# Patient Record
Sex: Male | Born: 1945 | Race: White | Hispanic: No | Marital: Married | Smoking: Never smoker
Health system: Southern US, Community
[De-identification: ages and names within clinical notes are randomized; demographics above are authoritative.]

## PROBLEM LIST (undated history)

## (undated) DIAGNOSIS — J189 Pneumonia, unspecified organism: Secondary | ICD-10-CM

## (undated) DIAGNOSIS — E785 Hyperlipidemia, unspecified: Secondary | ICD-10-CM

## (undated) DIAGNOSIS — E349 Endocrine disorder, unspecified: Secondary | ICD-10-CM

## (undated) DIAGNOSIS — I10 Essential (primary) hypertension: Secondary | ICD-10-CM

## (undated) DIAGNOSIS — G4730 Sleep apnea, unspecified: Secondary | ICD-10-CM

## (undated) DIAGNOSIS — C801 Malignant (primary) neoplasm, unspecified: Secondary | ICD-10-CM

## (undated) DIAGNOSIS — K219 Gastro-esophageal reflux disease without esophagitis: Secondary | ICD-10-CM

## (undated) DIAGNOSIS — K56609 Unspecified intestinal obstruction, unspecified as to partial versus complete obstruction: Secondary | ICD-10-CM

## (undated) DIAGNOSIS — D649 Anemia, unspecified: Secondary | ICD-10-CM

## (undated) DIAGNOSIS — E119 Type 2 diabetes mellitus without complications: Secondary | ICD-10-CM

## (undated) DIAGNOSIS — Z8572 Personal history of non-Hodgkin lymphomas: Secondary | ICD-10-CM

## (undated) DIAGNOSIS — C8590 Non-Hodgkin lymphoma, unspecified, unspecified site: Secondary | ICD-10-CM

## (undated) HISTORY — DX: Gastro-esophageal reflux disease without esophagitis: K21.9

## (undated) HISTORY — DX: Type 2 diabetes mellitus without complications: E11.9

## (undated) HISTORY — DX: Anemia, unspecified: D64.9

## (undated) HISTORY — DX: Endocrine disorder, unspecified: E34.9

## (undated) HISTORY — DX: Hyperlipidemia, unspecified: E78.5

## (undated) HISTORY — DX: Pneumonia, unspecified organism: J18.9

## (undated) HISTORY — DX: Essential (primary) hypertension: I10

## (undated) HISTORY — DX: Sleep apnea, unspecified: G47.30

## (undated) HISTORY — DX: Malignant (primary) neoplasm, unspecified: C80.1

## (undated) HISTORY — DX: Non-Hodgkin lymphoma, unspecified, unspecified site: C85.90

## (undated) HISTORY — DX: Unspecified intestinal obstruction, unspecified as to partial versus complete obstruction: K56.609

## (undated) HISTORY — PX: CARDIAC CATHETERIZATION: SHX172

## (undated) HISTORY — DX: Personal history of non-Hodgkin lymphomas: Z85.72

---

## 2009-09-08 DIAGNOSIS — C801 Malignant (primary) neoplasm, unspecified: Secondary | ICD-10-CM

## 2009-09-08 HISTORY — DX: Malignant (primary) neoplasm, unspecified: C80.1

## 2011-09-09 DIAGNOSIS — G4730 Sleep apnea, unspecified: Secondary | ICD-10-CM

## 2011-09-09 HISTORY — DX: Sleep apnea, unspecified: G47.30

## 2012-06-30 DIAGNOSIS — Z923 Personal history of irradiation: Secondary | ICD-10-CM | POA: Insufficient documentation

## 2012-06-30 DIAGNOSIS — C83398 Diffuse large b-cell lymphoma of other extranodal and solid organ sites: Secondary | ICD-10-CM | POA: Insufficient documentation

## 2012-06-30 DIAGNOSIS — C8339 Diffuse large B-cell lymphoma, extranodal and solid organ sites: Secondary | ICD-10-CM | POA: Insufficient documentation

## 2012-06-30 DIAGNOSIS — Z9221 Personal history of antineoplastic chemotherapy: Secondary | ICD-10-CM | POA: Insufficient documentation

## 2016-01-30 ENCOUNTER — Encounter (INDEPENDENT_AMBULATORY_CARE_PROVIDER_SITE_OTHER): Payer: Self-pay

## 2016-01-30 ENCOUNTER — Encounter: Payer: Self-pay | Admitting: Cardiovascular Disease

## 2016-01-30 ENCOUNTER — Ambulatory Visit (INDEPENDENT_AMBULATORY_CARE_PROVIDER_SITE_OTHER): Payer: Medicare Other | Admitting: Cardiovascular Disease

## 2016-01-30 VITALS — BP 118/80 | HR 61 | Ht 68.5 in | Wt 223.8 lb

## 2016-01-30 DIAGNOSIS — I739 Peripheral vascular disease, unspecified: Secondary | ICD-10-CM

## 2016-01-30 DIAGNOSIS — R7989 Other specified abnormal findings of blood chemistry: Secondary | ICD-10-CM

## 2016-01-30 DIAGNOSIS — E119 Type 2 diabetes mellitus without complications: Secondary | ICD-10-CM | POA: Diagnosis not present

## 2016-01-30 DIAGNOSIS — I10 Essential (primary) hypertension: Secondary | ICD-10-CM

## 2016-01-30 DIAGNOSIS — I25111 Atherosclerotic heart disease of native coronary artery with angina pectoris with documented spasm: Secondary | ICD-10-CM

## 2016-01-30 DIAGNOSIS — E669 Obesity, unspecified: Secondary | ICD-10-CM

## 2016-01-30 DIAGNOSIS — R079 Chest pain, unspecified: Secondary | ICD-10-CM | POA: Diagnosis not present

## 2016-01-30 DIAGNOSIS — E291 Testicular hypofunction: Secondary | ICD-10-CM | POA: Diagnosis not present

## 2016-01-30 DIAGNOSIS — I159 Secondary hypertension, unspecified: Secondary | ICD-10-CM | POA: Diagnosis not present

## 2016-01-30 DIAGNOSIS — E785 Hyperlipidemia, unspecified: Secondary | ICD-10-CM

## 2016-01-30 DIAGNOSIS — I2510 Atherosclerotic heart disease of native coronary artery without angina pectoris: Secondary | ICD-10-CM

## 2016-01-30 NOTE — Patient Instructions (Signed)
You are doing well. No medication changes were made.  We will check labs today BMP, lipids, free testosterone, hbaic  Please call us if you have new issues that need to be addressed before your next appt.  Your physician wants you to follow-up in: 6 months.  You will receive a reminder letter in the mail two months in advance. If you don't receive a letter, please call our office to schedule the follow-up appointment.

## 2016-01-30 NOTE — Assessment & Plan Note (Signed)
Coronary calcifications seen on PET scan 2013 and 2015 Discussed with him in detail, he denies any symptoms concerning for angina Prior stress test in 2013

## 2016-01-30 NOTE — Progress Notes (Signed)
Patient ID: [name], male    DOB: [birth year], 70 y.o.   MRN: [id number]  HPI Comments: Mr. 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 by his PCP around 6/12, Who presents to establish care in the [place] office for his hypertension, hyperlipidemia He has self-referred himself to our office at the suggestion of his son who works in the hospital system  In general reports that he feels well with no complaints Blood pressure typically well controlled at home, compliant with hisCPAP  reports he is lost 20 pounds over the past year by watching his diet No regular exercise regiment  Review of radius testing with him shows PET scan 2013 and 2015 showing coronary calcifications Other scanning showing mild aortic atherosclerosis  Lab work reviewed with him showing total cholesterol in the 130 range, LDL 74, triglycerides 177 Testosterone 300, currently receives shot every 2 weeks Normal LFTs Elevated creatinine 1.4, BUN 32  EKG shows normal sinus rhythm with rate 61 bpm, right bundle branch block        Allergies  Allergen Reactions  . Penicillins Hives     Medication List       This list is accurate as of: 01/30/16 10:07 AM.  Always use your most recent med list.               aspirin 81 MG tablet  Take 81 mg by mouth daily.     atorvastatin 20 MG tablet  Commonly known as:  LIPITOR  Take 20 mg by mouth daily.     CALCIUM 600 PO  Take by mouth daily.     FIBER CHOICE PO  Take by mouth daily.     Glucosamine-Chondroitin-Vit C  2000-1200-60 MG/30ML Liqd  Take by mouth daily.     hydrochlorothiazide 25 MG tablet  Commonly known as:  HYDRODIURIL  Take 25 mg by mouth daily.     lisinopril 40 MG tablet  Commonly known as:  PRINIVIL,ZESTRIL  Take 40 mg by mouth daily.     Magnesium 250 MG Tabs  Take by mouth daily.     metFORMIN 500 MG tablet  Commonly known as:  GLUCOPHAGE  Take 500 mg by mouth 2 (two) times daily with a meal.     metoprolol tartrate 25 MG tablet  Commonly known as:  LOPRESSOR  Take 25 mg by mouth 2 (two) times daily.     MOVE FREE PO  Take by mouth daily.     MSM 500 MG Caps  Take by mouth daily.     multivitamin tablet  Take 1 tablet by mouth daily.     OMEGA 3 PO  Take by mouth daily.     pantoprazole 40 MG tablet  Commonly known as:  PROTONIX  Take 40 mg by mouth daily.     spironolactone 25 MG tablet  Commonly known as:  ALDACTONE  Take 25 mg by mouth daily.     vitamin C 1000 MG tablet  Take 1,000 mg by mouth daily.     vitamin E 400 UNIT capsule  Take 400 Units by mouth daily.  Past Medical History  Diagnosis Date  . Diabetes mellitus without complication ([place])   . Hypotestosteronemia   . Hyperlipidemia   . Hx of lymphoma   . Sleep apnea   . Hypertension     Past Surgical History  Procedure Laterality Date  . Cardiac catheterization      Dr. Sabra Heck    Social History  reports that he has never smoked. He does not have any smokeless tobacco history on file. He reports that he does not drink alcohol or use illicit drugs.  Family History family history includes Heart attack in his father; Heart disease in his father.   Review of Systems  Constitutional: Negative.   Respiratory: Negative.   Cardiovascular: Negative.   Gastrointestinal: Negative.   Musculoskeletal: Negative.   Neurological: Negative.   Hematological: Negative.   Psychiatric/Behavioral: Negative.   All other systems reviewed and are negative.   BP 118/80 mmHg  Pulse 61   Ht 5' 8.5" (1.74 m)  Wt 223 lb 12 oz (101.492 kg)  BMI 33.52 kg/m2  Physical Exam  Constitutional: He is oriented to person, place, and time. He appears well-developed and well-nourished.  Obese  HENT:  Head: Normocephalic.  Nose: Nose normal.  Mouth/Throat: Oropharynx is clear and moist.  Eyes: Conjunctivae are normal. Pupils are equal, round, and reactive to light.  Neck: Normal range of motion. Neck supple. No JVD present.  Cardiovascular: Normal rate, regular rhythm, normal heart sounds and intact distal pulses.  Exam reveals no gallop and no friction rub.   No murmur heard. Pulmonary/Chest: Effort normal and breath sounds normal. No respiratory distress. He has no wheezes. He has no rales. He exhibits no tenderness.  Abdominal: Soft. Bowel sounds are normal. He exhibits no distension. There is no tenderness.  Musculoskeletal: Normal range of motion. He exhibits no edema or tenderness.  Lymphadenopathy:    He has no cervical adenopathy.  Neurological: He is alert and oriented to person, place, and time. Coordination normal.  Skin: Skin is warm and dry. No rash noted. No erythema.  Psychiatric: He has a normal mood and affect. His behavior is normal. Judgment and thought content normal.

## 2016-01-30 NOTE — Assessment & Plan Note (Signed)
Mild aortic atherosclerosis seen on PET scan We'll continue aggressive cholesterol management, Will check hemoglobin A1c

## 2016-01-30 NOTE — Assessment & Plan Note (Signed)
He has been receiving testosterone shots twice per month We'll check level today at his request

## 2016-01-30 NOTE — Assessment & Plan Note (Signed)
Cholesterol at goal given coronary calcifications and aortic atherosclerosis He would like to recheck his cholesterol today

## 2016-01-30 NOTE — Assessment & Plan Note (Signed)
We have encouraged continued exercise, careful diet management in an effort to lose weight. Lab work pending from today, hemoglobin A1c May need referral to endocrinology as he does not have primary care

## 2016-01-30 NOTE — Assessment & Plan Note (Signed)
Blood pressure is well controlled on today's visit. No changes made to the medications.  we did suggest cutting his HCTZ in half given low blood pressure and renal dysfunction Suspected pre-renal state Encouraged him to drink more fluids

## 2016-02-01 LAB — LIPID PANEL
Chol/HDL Ratio: 4.6 ratio units (ref 0.0–5.0)
Cholesterol, Total: 134 mg/dL (ref 100–199)
HDL: 29 mg/dL — ABNORMAL LOW (ref >39)
LDL Calculated: 63 mg/dL (ref 0–99)
Triglycerides: 212 mg/dL — ABNORMAL HIGH (ref 0–149)
VLDL Cholesterol Cal: 42 mg/dL — ABNORMAL HIGH (ref 5–40)

## 2016-02-01 LAB — BASIC METABOLIC PANEL
BUN/Creatinine Ratio: 25 — ABNORMAL HIGH (ref 10–24)
BUN: 33 mg/dL — ABNORMAL HIGH (ref 8–27)
CO2: 18 mmol/L (ref 18–29)
Calcium: 10.6 mg/dL — ABNORMAL HIGH (ref 8.6–10.2)
Chloride: 101 mmol/L (ref 96–106)
Creatinine, Ser: 1.32 mg/dL — ABNORMAL HIGH (ref 0.76–1.27)
GFR calc Af Amer: 63 mL/min/{1.73_m2} (ref >59)
GFR calc non Af Amer: 54 mL/min/{1.73_m2} — ABNORMAL LOW (ref >59)
Glucose: 118 mg/dL — ABNORMAL HIGH (ref 65–99)
Potassium: 4.5 mmol/L (ref 3.5–5.2)
Sodium: 144 mmol/L (ref 134–144)

## 2016-02-01 LAB — HEMOGLOBIN A1C
Est. average glucose Bld gHb Est-mCnc: 120 mg/dL
Hgb A1c MFr Bld: 5.8 % — ABNORMAL HIGH (ref 4.8–5.6)

## 2016-02-01 LAB — TESTOSTERONE, FREE: Testosterone, Free: 15.2 pg/mL (ref 6.6–18.1)

## 2016-09-12 ENCOUNTER — Encounter: Payer: Self-pay | Admitting: Cardiovascular Disease

## 2016-09-22 ENCOUNTER — Encounter: Payer: Self-pay | Admitting: Cardiovascular Disease

## 2016-09-22 ENCOUNTER — Ambulatory Visit (INDEPENDENT_AMBULATORY_CARE_PROVIDER_SITE_OTHER): Payer: Medicare Other | Admitting: Cardiovascular Disease

## 2016-09-22 VITALS — BP 158/98 | HR 67 | Ht 68.0 in | Wt 235.0 lb

## 2016-09-22 DIAGNOSIS — I2510 Atherosclerotic heart disease of native coronary artery without angina pectoris: Secondary | ICD-10-CM

## 2016-09-22 DIAGNOSIS — I10 Essential (primary) hypertension: Secondary | ICD-10-CM | POA: Diagnosis not present

## 2016-09-22 DIAGNOSIS — R0602 Shortness of breath: Secondary | ICD-10-CM

## 2016-09-22 DIAGNOSIS — E1100 Type 2 diabetes mellitus with hyperosmolarity without nonketotic hyperglycemic-hyperosmolar coma (NKHHC): Secondary | ICD-10-CM

## 2016-09-22 DIAGNOSIS — I739 Peripheral vascular disease, unspecified: Secondary | ICD-10-CM

## 2016-09-22 DIAGNOSIS — E782 Mixed hyperlipidemia: Secondary | ICD-10-CM

## 2016-09-22 MED ORDER — METOPROLOL SUCCINATE ER 50 MG PO TB24: 50.0000 mg | ORAL_TABLET | Freq: Every day | ORAL | 3 refills | Status: DC

## 2016-09-22 MED ORDER — DOXAZOSIN MESYLATE 8 MG PO TABS: 8.0000 mg | ORAL_TABLET | Freq: Two times a day (BID) | ORAL | 3 refills | Status: DC

## 2016-09-22 NOTE — Progress Notes (Signed)
Cardiology Office Note  Date:  09/22/2016   ID:  [name], DOB 1946-03-12, MRN [id number]  PCP:  Tommie Sams, MD   Chief Complaint  Patient presents with  . other    6 month follow up. Meds reviewed by the pt. verbally. Pt. c/o elevated BP, rapid heart beats, runny nose, cough and congestion.     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 by his PCP around 6/12, He presents for follow-up of his hypertension, hyperlipidemia, Coronary disease seen on previous PET scans  In follow-up today, he reports having hypertension Weight has trended up but these 10 pounds, poor diet through the holidays Monitoring blood pressure at home, numbers have been elevated for 2-3 months No exercise Reports he is compliant with his CPAP He is requesting lab work today, would like to have lab work done through the [facility] of changing primary care physicians to [facility] [facility]  EKG on today's visit  shows normal sinus rhythm with rate 67 bpm, right bundle branch block   Other past medical history reviewed  previous testing  shows PET scan 2013 and 2015 showing coronary calcifications Other scanning showing mild aortic atherosclerosis  Previous lab work  total cholesterol in the 130 range, LDL 74, triglycerides 177 Testosterone 300, currently receives shot every 2 weeks Normal LFTs Elevated creatinine 1.4, BUN 32    PMH:   has a past medical history of Diabetes mellitus without complication ([place]); lymphoma; Hyperlipidemia; Hypertension;  Hypotestosteronemia; and Sleep apnea.  PSH:    Past Surgical History:  Procedure Laterality Date  . CARDIAC CATHETERIZATION     Dr. Sabra Heck    Current Outpatient Prescriptions  Medication Sig Dispense Refill  . Ascorbic Acid (VITAMIN C) 1000 MG tablet Take 1,000 mg by mouth daily.    Marland Kitchen aspirin 81 MG tablet Take 81 mg by mouth daily.    Marland Kitchen atorvastatin (LIPITOR) 20 MG tablet Take 20 mg by mouth daily.    . Calcium Carbonate (CALCIUM 600 PO) Take by mouth daily.    . Glucosamine-Chondroitin (MOVE FREE PO) Take by mouth daily.    . Glucosamine-Chondroitin-Vit C 2000-1200-60 MG/30ML LIQD Take by mouth daily.    . hydrochlorothiazide (HYDRODIURIL) 25 MG tablet Take 25 mg by mouth daily.    . Inulin (FIBER CHOICE PO) Take by mouth daily.    Marland Kitchen lisinopril (PRINIVIL,ZESTRIL) 40 MG tablet Take 40 mg by mouth daily.    . Magnesium 250 MG TABS Take by mouth daily.    . metFORMIN (GLUCOPHAGE) 500 MG tablet Take 500 mg by mouth 2 (two) times daily with a meal.    . Methylsulfonylmethane (MSM) 500 MG CAPS Take by mouth daily.    . Multiple Vitamin (MULTIVITAMIN) tablet Take 1 tablet by mouth daily.    . Omega-3 Fatty Acids (OMEGA 3 PO) Take by mouth daily.    . pantoprazole (PROTONIX) 40 MG tablet Take 40 mg by mouth daily.    Marland Kitchen spironolactone (ALDACTONE) 25 MG tablet Take 25 mg by mouth daily.    . vitamin E 400 UNIT capsule Take 400 Units by mouth daily.    Marland Kitchen  doxazosin (CARDURA) 8 MG tablet Take 1 tablet (8 mg total) by mouth 2 (two) times daily. 180 tablet 3  . metoprolol succinate (TOPROL-XL) 50 MG 24 hr tablet Take 1 tablet (50 mg total) by mouth daily. Take with or immediately following a meal. 90 tablet 3   No current facility-administered medications for this visit.      Allergies:   Penicillins   Social History:  The patient  reports that he has never smoked. He has never used smokeless tobacco. He reports that he does not drink alcohol or use drugs.   Family History:   family history  includes Heart attack in his father; Heart disease in his father.    Review of Systems: Review of Systems  Constitutional: Negative.        Weight gain  Respiratory: Positive for shortness of breath.   Cardiovascular: Negative.   Gastrointestinal: Negative.   Musculoskeletal: Negative.   Neurological: Negative.   Psychiatric/Behavioral: Negative.   All other systems reviewed and are negative.    PHYSICAL EXAM: VS:  BP (!) 158/98 (BP Location: Right Arm, Patient Position: Sitting, Cuff Size: Normal)   Pulse 67   Ht 5\' 8"  (1.727 m)   Wt 235 lb (106.6 kg)   BMI 35.73 kg/m  , BMI Body mass index is 35.73 kg/m. GEN: Well nourished, well developed, in no acute distress  HEENT: normal  Neck: no JVD, carotid bruits, or masses Cardiac: RRR; no murmurs, rubs, or gallops,no edema  Respiratory:  clear to auscultation bilaterally, normal work of breathing GI: soft, nontender, nondistended, + BS MS: no deformity or atrophy  Skin: warm and dry, no rash Neuro:  Strength and sensation are intact Psych: euthymic mood, full affect    Recent Labs: 01/30/2016: BUN 33; Creatinine, Ser 1.32; Potassium 4.5; Sodium 144    Lipid Panel Lab Results  Component Value Date   CHOL 134 01/30/2016   HDL 29 (L) 01/30/2016   LDLCALC 63 01/30/2016   TRIG 212 (H) 01/30/2016      Wt Readings from Last 3 Encounters:  09/22/16 235 lb (106.6 kg)  01/30/16 223 lb 12 oz (101.5 kg)       ASSESSMENT AND PLAN:  Coronary artery disease involving native coronary artery of native heart without angina pectoris -  Currently with no symptoms of angina. No further workup at this time. Continue current medication regimen.  Essential hypertension -  Pressure markedly elevated on today's visit, Recommended he add Cardura 4 mg twice a day We'll change metoprolol tartrate to metoprolol succinate 100 mg daily at his request He periodically misses the evening dose of metoprolol tartrate  SOB (shortness of  breath) -  Echocardiogram ordered for worsening symptoms No significant the edema on exam Waking likely from dietary indiscretion Recommended exercise for weight loss  Also possible contribution from elevated blood pressures  Type 2 diabetes Recommended strict low carbohydrate diet  We will check hemoglobin A1c PAD (peripheral artery disease) (HCC) Aortic atherosclerosis, coronary disease seen on previous PET scans Stressed importance of aggressive lipid management  Mixed hyperlipidemia We have ordered lipid panel today, fasting  Shortness of breath Likely secondary to weight gain, deconditioning Recommended weight loss, regular exercise program  Morbid obesity ([place]) We have encouraged continued exercise, careful diet management in an effort to lose weight.   Total encounter time more than 25 minutes  Greater than 50% was spent in counseling and coordination of care with the patient   Disposition:   F/U  6  months   Orders Placed This Encounter  Procedures  . Hepatic function panel  . Lipid Profile  . Basic Metabolic Panel (BMET)  . Hemoglobin A1c  . EKG 12-Lead  . ECHOCARDIOGRAM COMPLETE     Signed, Esmond Plants, M.D., Ph.D. 09/22/2016  [place], [place]

## 2016-09-22 NOTE — Patient Instructions (Addendum)
Medication Instructions:   Please start cardura/dozasoxin 1/2 pill once a day Slowly titrate upwards to whole pill if blood pressure runs high  Please stop the metoprolol tartrate, Start metoprolol succinate once a day   Labwork:  Liver, lipids, HBA1C, BMP  Testing/Procedures:  We will order an echocardiogram for shortness of breath  Echocardiography is a painless test that uses sound waves to create images of your heart. It provides your doctor with information about the size and shape of your heart and how well your heart's chambers and valves are working. This procedure takes approximately one hour. There are no restrictions for this procedure.   I recommend watching educational videos on topics of interest to you at:       [email or web address]  Enter code: HEARTCARE    Follow-Up: It was a pleasure seeing you in the office today. Please call us if you have new issues that need to be addressed before your next appt.  [phone]  Your physician wants you to follow-up in: 6 months.  You will receive a reminder letter in the mail two months in advance. If you don't receive a letter, please call our office to schedule the follow-up appointment.  If you need a refill on your cardiac medications before your next appointment, please call your pharmacy.    Echocardiogram An echocardiogram, or echocardiography, uses sound waves (ultrasound) to produce an image of your heart. The echocardiogram is simple, painless, obtained within a short period of time, and offers valuable information to your health care provider. The images from an echocardiogram can provide information such as:  Evidence of coronary artery disease (CAD).  Heart size.  Heart muscle function.  Heart valve function.  Aneurysm detection.  Evidence of a past heart attack.  Fluid buildup around the heart.  Heart muscle thickening.  Assess heart valve function. Tell a health care provider about:  Any  allergies you have.  All medicines you are taking, including vitamins, herbs, eye drops, creams, and over-the-counter medicines.  Any problems you or family members have had with anesthetic medicines.  Any blood disorders you have.  Any surgeries you have had.  Any medical conditions you have.  Whether you are pregnant or may be pregnant. What happens before the procedure? No special preparation is needed. Eat and drink normally. What happens during the procedure?  In order to produce an image of your heart, gel will be applied to your chest and a wand-like tool (transducer) will be moved over your chest. The gel will help transmit the sound waves from the transducer. The sound waves will harmlessly bounce off your heart to allow the heart images to be captured in real-time motion. These images will then be recorded.  You may need an IV to receive a medicine that improves the quality of the pictures. What happens after the procedure? You may return to your normal schedule including diet, activities, and medicines, unless your health care provider tells you otherwise. This information is not intended to replace advice given to you by your health care provider. Make sure you discuss any questions you have with your health care provider. Document Released: 08/22/2000 Document Revised: 04/12/2016 Document Reviewed: 05/02/2013 Elsevier Interactive Patient Education  2017 [employer].

## 2016-09-23 ENCOUNTER — Other Ambulatory Visit: Payer: Self-pay

## 2016-09-23 ENCOUNTER — Ambulatory Visit (INDEPENDENT_AMBULATORY_CARE_PROVIDER_SITE_OTHER): Payer: Medicare Other

## 2016-09-23 DIAGNOSIS — I10 Essential (primary) hypertension: Secondary | ICD-10-CM

## 2016-09-23 DIAGNOSIS — R0602 Shortness of breath: Secondary | ICD-10-CM

## 2016-09-23 DIAGNOSIS — I2510 Atherosclerotic heart disease of native coronary artery without angina pectoris: Secondary | ICD-10-CM

## 2016-09-23 LAB — BASIC METABOLIC PANEL
BUN/Creatinine Ratio: 23 (ref 10–24)
BUN: 27 mg/dL (ref 8–27)
CO2: 22 mmol/L (ref 18–29)
Calcium: 9.9 mg/dL (ref 8.6–10.2)
Chloride: 100 mmol/L (ref 96–106)
Creatinine, Ser: 1.19 mg/dL (ref 0.76–1.27)
GFR calc Af Amer: 71 mL/min/{1.73_m2} (ref >59)
GFR calc non Af Amer: 61 mL/min/{1.73_m2} (ref >59)
Glucose: 117 mg/dL — ABNORMAL HIGH (ref 65–99)
Potassium: 4.6 mmol/L (ref 3.5–5.2)
Sodium: 143 mmol/L (ref 134–144)

## 2016-09-23 LAB — HEMOGLOBIN A1C
Est. average glucose Bld gHb Est-mCnc: 126 mg/dL
Hgb A1c MFr Bld: 6 % — ABNORMAL HIGH (ref 4.8–5.6)

## 2016-09-23 LAB — LIPID PANEL
Chol/HDL Ratio: 3.3 ratio units (ref 0.0–5.0)
Cholesterol, Total: 137 mg/dL (ref 100–199)
HDL: 42 mg/dL (ref >39)
LDL Calculated: 68 mg/dL (ref 0–99)
Triglycerides: 136 mg/dL (ref 0–149)
VLDL Cholesterol Cal: 27 mg/dL (ref 5–40)

## 2016-09-23 LAB — HEPATIC FUNCTION PANEL
ALT: 33 IU/L (ref 0–44)
AST: 24 IU/L (ref 0–40)
Albumin: 4.7 g/dL (ref 3.5–4.8)
Alkaline Phosphatase: 70 IU/L (ref 39–117)
Bilirubin Total: 1 mg/dL (ref 0.0–1.2)
Bilirubin, Direct: 0.28 mg/dL (ref 0.00–0.40)
Total Protein: 7.4 g/dL (ref 6.0–8.5)

## 2016-09-23 MED ORDER — POTASSIUM CHLORIDE ER 10 MEQ PO TBCR: 10.0000 meq | EXTENDED_RELEASE_TABLET | Freq: Every day | ORAL | 3 refills | Status: DC

## 2016-09-23 MED ORDER — FUROSEMIDE 20 MG PO TABS: 20.0000 mg | ORAL_TABLET | Freq: Every day | ORAL | 3 refills | Status: DC

## 2016-09-29 ENCOUNTER — Other Ambulatory Visit: Payer: Self-pay

## 2016-09-29 DIAGNOSIS — R609 Edema, unspecified: Secondary | ICD-10-CM

## 2016-10-06 ENCOUNTER — Other Ambulatory Visit: Payer: Self-pay | Admitting: Cardiovascular Disease

## 2016-10-06 ENCOUNTER — Other Ambulatory Visit (INDEPENDENT_AMBULATORY_CARE_PROVIDER_SITE_OTHER): Payer: Medicare Other

## 2016-10-06 DIAGNOSIS — I2510 Atherosclerotic heart disease of native coronary artery without angina pectoris: Secondary | ICD-10-CM

## 2016-10-06 DIAGNOSIS — R0602 Shortness of breath: Secondary | ICD-10-CM

## 2016-10-06 DIAGNOSIS — I10 Essential (primary) hypertension: Secondary | ICD-10-CM | POA: Diagnosis not present

## 2016-10-06 DIAGNOSIS — E782 Mixed hyperlipidemia: Secondary | ICD-10-CM | POA: Diagnosis not present

## 2016-10-07 LAB — BASIC METABOLIC PANEL
BUN/Creatinine Ratio: 21 (ref 10–24)
BUN: 29 mg/dL — ABNORMAL HIGH (ref 8–27)
CO2: 20 mmol/L (ref 18–29)
Calcium: 9.3 mg/dL (ref 8.6–10.2)
Chloride: 100 mmol/L (ref 96–106)
Creatinine, Ser: 1.36 mg/dL — ABNORMAL HIGH (ref 0.76–1.27)
GFR calc Af Amer: 60 mL/min/{1.73_m2} (ref >59)
GFR calc non Af Amer: 52 mL/min/{1.73_m2} — ABNORMAL LOW (ref >59)
Glucose: 109 mg/dL — ABNORMAL HIGH (ref 65–99)
Potassium: 4.6 mmol/L (ref 3.5–5.2)
Sodium: 141 mmol/L (ref 134–144)

## 2016-10-13 ENCOUNTER — Ambulatory Visit: Payer: No Typology Code available for payment source | Admitting: Cardiovascular Disease

## 2016-10-30 ENCOUNTER — Ambulatory Visit (INDEPENDENT_AMBULATORY_CARE_PROVIDER_SITE_OTHER): Payer: Medicare Other | Admitting: Cardiovascular Disease

## 2016-10-30 ENCOUNTER — Encounter: Payer: Self-pay | Admitting: Cardiovascular Disease

## 2016-10-30 VITALS — BP 132/92 | HR 75 | Ht 68.5 in | Wt 237.0 lb

## 2016-10-30 DIAGNOSIS — E782 Mixed hyperlipidemia: Secondary | ICD-10-CM

## 2016-10-30 DIAGNOSIS — I10 Essential (primary) hypertension: Secondary | ICD-10-CM

## 2016-10-30 DIAGNOSIS — I739 Peripheral vascular disease, unspecified: Secondary | ICD-10-CM | POA: Diagnosis not present

## 2016-10-30 DIAGNOSIS — I2510 Atherosclerotic heart disease of native coronary artery without angina pectoris: Secondary | ICD-10-CM

## 2016-10-30 DIAGNOSIS — E119 Type 2 diabetes mellitus without complications: Secondary | ICD-10-CM

## 2016-10-30 DIAGNOSIS — I272 Pulmonary hypertension, unspecified: Secondary | ICD-10-CM | POA: Diagnosis not present

## 2016-10-30 DIAGNOSIS — I270 Primary pulmonary hypertension: Secondary | ICD-10-CM

## 2016-10-30 MED ORDER — DOXAZOSIN MESYLATE 8 MG PO TABS: 8.0000 mg | ORAL_TABLET | Freq: Every day | ORAL | 6 refills | Status: DC

## 2016-10-30 NOTE — Patient Instructions (Addendum)
Dr. Haroldine Laws for pulmonary HTN evaluation A referral has been sent to the CHF clinic Dr. Haroldine Laws will review your chart and they will contact you with an appt [address], [place] Parking Code for Feb: 5001  Medication Instructions:   No medication changes made  Labwork:  No new labs needed  Testing/Procedures:  No further testing at this time   I recommend watching educational videos on topics of interest to you at:       [email or web address]  Enter code: HEARTCARE    Follow-Up: It was a pleasure seeing you in the office today. Please call us if you have new issues that need to be addressed before your next appt.  [phone]  Your physician wants you to follow-up in: 6 months.  You will receive a reminder letter in the mail two months in advance. If you don't receive a letter, please call our office to schedule the follow-up appointment.  If you need a refill on your cardiac medications before your next appointment, please call your pharmacy.

## 2016-10-30 NOTE — Progress Notes (Signed)
Cardiology Office Note  Date:  10/30/2016   ID:  [name], DOB 08/28/1946, MRN [id number]  PCP:  Tommie Sams, MD   Chief Complaint  Patient presents with  . other    1 month follow up from Echo. Meds reviewed by the pt. verbally.     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 He always attributed his symptoms to obesity  Now with sinus issues, primary care started ABX On tamiflu Did not test for flu, he did have exposure to someone with the flu  cardura 8 mg daily started previously for blood pressure BP well controlled at home Denies having significant side effects No exercise Reports he is compliant with his CPAP  EKG on today's visit  shows normal sinus rhythm with rate 75 bpm, right bundle branch block   Other past medical history reviewed  previous testing  shows PET scan 2013 and 2015 showing coronary calcifications Other scanning showing mild aortic  atherosclerosis  Previous lab work  total cholesterol in the 130 range, LDL 74, triglycerides 177 Testosterone 300, currently receives shot every 2 weeks Normal LFTs Elevated creatinine 1.4, BUN 32   PMH:   has a past medical history of Diabetes mellitus without complication ([place]); lymphoma; Hyperlipidemia; Hypertension; Hypotestosteronemia; and Sleep apnea.  PSH:    Past Surgical History:  Procedure Laterality Date  . CARDIAC CATHETERIZATION     Dr. Sabra Heck    Current Outpatient Prescriptions  Medication Sig Dispense Refill  . Ascorbic Acid (VITAMIN C) 1000 MG tablet Take 1,000 mg by mouth daily.    Marland Kitchen aspirin 81 MG tablet Take 81 mg by mouth daily.    Marland Kitchen atorvastatin (LIPITOR) 20 MG tablet Take 20 mg by mouth daily.    Marland Kitchen azithromycin (ZITHROMAX) 250 MG tablet Take by mouth daily.    . Calcium Carbonate (CALCIUM 600 PO) Take by mouth daily.    Marland Kitchen doxazosin (CARDURA) 8 MG tablet Take 1 tablet (8 mg total) by mouth daily. 90 tablet 6  . furosemide (LASIX) 20 MG tablet Take 1 tablet (20 mg total) by mouth daily. Takes a 1/2 pill 90 tablet 3  . Glucosamine-Chondroitin (MOVE FREE PO) Take by mouth daily.    . Glucosamine-Chondroitin-Vit C 2000-1200-60 MG/30ML LIQD Take by mouth daily.    . hydrochlorothiazide (HYDRODIURIL) 25 MG tablet Take 25 mg by mouth daily.    . Inulin (FIBER CHOICE PO) Take by mouth daily.    Marland Kitchen lisinopril (PRINIVIL,ZESTRIL) 40 MG tablet Take 40 mg by mouth daily.    . Magnesium 250 MG TABS  Take by mouth daily.    . metFORMIN (GLUCOPHAGE) 500 MG tablet Take 500 mg by mouth 2 (two) times daily with a meal.    . Methylsulfonylmethane (MSM) 500 MG CAPS Take by mouth daily.    . metoprolol succinate (TOPROL-XL) 50 MG 24 hr tablet Take 1 tablet (50 mg total) by mouth daily. Take with or immediately following a meal. 90 tablet 3  . Multiple Vitamin (MULTIVITAMIN) tablet Take 1 tablet by mouth daily.    . Omega-3 Fatty Acids (OMEGA 3 PO) Take by mouth daily.    .  pantoprazole (PROTONIX) 40 MG tablet Take 40 mg by mouth daily.    . potassium chloride (K-DUR) 10 MEQ tablet Take 1 tablet (10 mEq total) by mouth daily. 90 tablet 3  . spironolactone (ALDACTONE) 25 MG tablet Take 25 mg by mouth daily.    . vitamin E 400 UNIT capsule Take 400 Units by mouth daily.     No current facility-administered medications for this visit.      Allergies:   Penicillins   Social History:  The patient  reports that he has never smoked. He has never used smokeless tobacco. He reports that he does not drink alcohol or use drugs.   Family History:   family history includes Heart attack in his father; Heart disease in his father.    Review of Systems: Review of Systems  Constitutional: Negative.   Respiratory: Positive for shortness of breath.   Cardiovascular: Negative.   Gastrointestinal: Negative.   Musculoskeletal: Negative.   Neurological: Negative.   Psychiatric/Behavioral: Negative.   All other systems reviewed and are negative.    PHYSICAL EXAM: VS:  BP (!) 132/92 (BP Location: Left Arm, Patient Position: Sitting, Cuff Size: Normal)   Pulse 75   Ht 5' 8.5" (1.74 m)   Wt 237 lb (107.5 kg)   BMI 35.51 kg/m  , BMI Body mass index is 35.51 kg/m. GEN: Well nourished, well developed, in no acute distress, obese  HEENT: normal  Neck: no JVD, carotid bruits, or masses Cardiac: RRR; no murmurs, rubs, or gallops,no edema  Respiratory:  clear to auscultation bilaterally, normal work of breathing GI: soft, nontender, nondistended, + BS MS: no deformity or atrophy  Skin: warm and dry, no rash Neuro:  Strength and sensation are intact Psych: euthymic mood, full affect    Recent Labs: 09/22/2016: ALT 33 10/06/2016: BUN 29; Creatinine, Ser 1.36; Potassium 4.6; Sodium 141    Lipid Panel Lab Results  Component Value Date   CHOL 137 09/22/2016   HDL 42 09/22/2016   LDLCALC 68 09/22/2016   TRIG 136 09/22/2016      Wt Readings from Last 3 Encounters:   10/30/16 237 lb (107.5 kg)  09/22/16 235 lb (106.6 kg)  01/30/16 223 lb 12 oz (101.5 kg)       ASSESSMENT AND PLAN:  Mixed hyperlipidemia - Plan: EKG 12-Lead Cholesterol is at goal on the current lipid regimen. No changes to the medications were made.  Essential hypertension - Plan: EKG 12-Lead Blood pressure is well controlled on today's visit. No changes made to the medications.  Coronary artery disease involving native coronary artery of native heart without angina pectoris - Plan: EKG 12-Lead Currently with no symptoms of angina. No further workup at this time. Continue current medication regimen. If he does need right heart catheterization for workup of pulmonary hypertension, he may benefit from left and right cath. Coronary artery disease seen previously on PET scan  PAD (peripheral artery disease) ([place]) - Plan: EKG 12-Lead Aortic atherosclerosis, coronary disease seen on previous PET scans  Type 2 diabetes mellitus without complication, without long-term current use of insulin ([place]) - Plan: EKG 12-Lead We have encouraged continued exercise, careful diet management in an effort to lose weight.  Pulmonary hypertension  long discussion with him today concerning various treatment options  As he lives relatively close to [place], possibly longer trip to [place], recommended he discuss current findings with Dr. Haroldine Laws. Moderate to severely elevated right heart pressures,, minimal and if it so far with low-dose diuretics. Suspect he would benefit from right heart catheterization, even left heart given coronary calcifications seen on this PET scan. He may benefit from advanced therapies for pulmonary hypertension , such as revatio,etc   Total encounter time more than 25 minutes  Greater than 50% was spent in counseling and coordination of care with the patient   Disposition:   F/U  6 months   Orders Placed This Encounter  Procedures  . EKG 12-Lead      Signed, Esmond Plants, M.D., Ph.D. 10/30/2016  [place], [place]

## 2016-12-22 ENCOUNTER — Ambulatory Visit (HOSPITAL_COMMUNITY)
Admission: RE | Admit: 2016-12-22 | Discharge: 2016-12-22 | Disposition: A | Discharge status: Home / Self Care | Payer: Medicare Other | Source: Ambulatory Visit | Attending: Internal Medicine | Admitting: Internal Medicine

## 2016-12-22 ENCOUNTER — Other Ambulatory Visit (HOSPITAL_COMMUNITY): Payer: Self-pay | Admitting: *Deleted

## 2016-12-22 ENCOUNTER — Encounter (HOSPITAL_COMMUNITY): Payer: Self-pay | Admitting: *Deleted

## 2016-12-22 ENCOUNTER — Encounter (HOSPITAL_COMMUNITY): Payer: Self-pay | Admitting: Internal Medicine

## 2016-12-22 VITALS — BP 130/72 | HR 70 | Wt 237.5 lb

## 2016-12-22 DIAGNOSIS — G4733 Obstructive sleep apnea (adult) (pediatric): Secondary | ICD-10-CM | POA: Diagnosis not present

## 2016-12-22 DIAGNOSIS — I2510 Atherosclerotic heart disease of native coronary artery without angina pectoris: Secondary | ICD-10-CM | POA: Insufficient documentation

## 2016-12-22 DIAGNOSIS — E119 Type 2 diabetes mellitus without complications: Secondary | ICD-10-CM | POA: Insufficient documentation

## 2016-12-22 DIAGNOSIS — I272 Pulmonary hypertension, unspecified: Secondary | ICD-10-CM

## 2016-12-22 DIAGNOSIS — Z6835 Body mass index (BMI) 35.0-35.9, adult: Secondary | ICD-10-CM | POA: Insufficient documentation

## 2016-12-22 DIAGNOSIS — Z7982 Long term (current) use of aspirin: Secondary | ICD-10-CM | POA: Insufficient documentation

## 2016-12-22 DIAGNOSIS — Z79899 Other long term (current) drug therapy: Secondary | ICD-10-CM | POA: Insufficient documentation

## 2016-12-22 DIAGNOSIS — R0602 Shortness of breath: Secondary | ICD-10-CM

## 2016-12-22 DIAGNOSIS — Z7984 Long term (current) use of oral hypoglycemic drugs: Secondary | ICD-10-CM | POA: Diagnosis not present

## 2016-12-22 DIAGNOSIS — I2729 Other secondary pulmonary hypertension: Secondary | ICD-10-CM | POA: Insufficient documentation

## 2016-12-22 DIAGNOSIS — I10 Essential (primary) hypertension: Secondary | ICD-10-CM | POA: Insufficient documentation

## 2016-12-22 DIAGNOSIS — Z880 Allergy status to penicillin: Secondary | ICD-10-CM | POA: Insufficient documentation

## 2016-12-22 LAB — CBC
HCT: 47.6 % (ref 39.0–52.0)
Hemoglobin: 17.2 g/dL — ABNORMAL HIGH (ref 13.0–17.0)
MCH: 33 pg (ref 26.0–34.0)
MCHC: 36.1 g/dL — ABNORMAL HIGH (ref 30.0–36.0)
MCV: 91.4 fL (ref 78.0–100.0)
Platelets: 137 10*3/uL — ABNORMAL LOW (ref 150–400)
RBC: 5.21 MIL/uL (ref 4.22–5.81)
RDW: 13.5 % (ref 11.5–15.5)
WBC: 7.7 10*3/uL (ref 4.0–10.5)

## 2016-12-22 LAB — BASIC METABOLIC PANEL
Anion gap: 11 (ref 5–15)
BUN: 34 mg/dL — ABNORMAL HIGH (ref 6–20)
CO2: 21 mmol/L — ABNORMAL LOW (ref 22–32)
Calcium: 9.5 mg/dL (ref 8.9–10.3)
Chloride: 104 mmol/L (ref 101–111)
Creatinine, Ser: 1.28 mg/dL — ABNORMAL HIGH (ref 0.61–1.24)
GFR calc Af Amer: >60 mL/min (ref >60)
GFR calc non Af Amer: 55 mL/min — ABNORMAL LOW (ref >60)
Glucose, Bld: 101 mg/dL — ABNORMAL HIGH (ref 65–99)
Potassium: 3.9 mmol/L (ref 3.5–5.1)
Sodium: 136 mmol/L (ref 135–145)

## 2016-12-22 LAB — PROTIME-INR
INR: 1.04
Prothrombin Time: 13.6 seconds (ref 11.4–15.2)

## 2016-12-22 NOTE — Patient Instructions (Signed)
Your physician has recommended that you have a pulmonary function test. Pulmonary Function Tests are a group of tests that measure how well air moves in and out of your lungs.  Heart Catheterization on Tuesday 4/24//18, see instruction sheet  You have been referred to Pulmonary Rehab  Your physician recommends that you schedule a follow-up appointment in: 2 months  Dr Haroldine Laws has recommended the [place]:  [place] By [facility] Staff Definition The [facility] Diet is a popular weight-loss diet created in 2003 by cardiologist Cletis Media and first outlined in his best-selling book, "The [employer] Diet: The [employer], Doctor-Designed, [place] for [employer] and Healthy [employer] Loss." The [place] is a [employer].  The [place], which is named after a glamorous area of [place], is sometimes called a modified low-carbohydrate diet. The [facility] Diet is lower in carbs (carbohydrates) and higher in protein and healthy fats than is a typical eating plan. But it's not a strict low-carb diet, and you don't have to count carbs.  Purpose The purpose of the [facility] Diet is to change the overall balance of the foods you eat to encourage weight loss and a healthy lifestyle. The [place] says it's a healthy way of eating whether you want to lose weight or not.  Why you might follow the [place] might choose to follow the [place] because you:  Enjoy the types and amounts of food featured in the diet Want a diet that restricts certain carbs and fats to help you lose weight Want to change your overall eating habits Want a diet you can stick with for life Like the related [employer] products, such as cookbooks and diet foods Check with your doctor or health care provider before starting any weight-loss diet, especially if you have any health concerns.  Diet details The [place] says that its  balance of good carbs, lean protein and healthy fats makes it a nutrient-dense, fiber-rich diet that you can follow for a lifetime of healthy eating.  The [place] says that it'll teach you about eliminating so-called "bad" carbs from your diet. It uses the glycemic index and glycemic load to determine which carbs you should avoid. Foods with a high glycemic index tend to increase your blood sugar faster, higher and longer than do foods with a lower index. Some evidence suggests that this increase in blood sugar can boost your appetite, leading to increased eating and weight gain and possibly diabetes, which can all contribute to cardiovascular disease.  The [place] also teaches you about the different kinds of dietary fats and encourages you to limit unhealthy fats, while eating more foods with healthier monounsaturated fats. The [place] emphasizes the benefits of fiber and whole grains, and encourages you to include fruits and vegetables in your eating plan.  Carbohydrates The [facility] Diet is lower in carbohydrates than is a typical eating plan, but not as low as a true low-carb diet. On a typical eating plan, about 45 to 65 percent of your daily calories come from carbohydrates. Based on a 2,000-calorie-a-day diet, this amounts to about 225 to 325 grams of carbohydrates a day.  In the final maintenance phase of the [place], you can get as much as 28 percent of your daily calories from carbohydrates, or about 140 grams of carbohydrates a day. A true low-carb diet might restrict your  carb intake to as little as 20 to 100 grams a day.  Exercise The [facility] Diet has evolved over time and now recommends exercise as an important part of your lifestyle. The [place] says that regular exercise will boost your metabolism and help prevent weight-loss plateaus.  Phases of the [place] Diet has three phases:  Phase 1. This two-week  phase is designed to eliminate cravings for foods high in sugar and refined starches to jump-start weight loss. You cut out almost all carbohydrates from your diet, including pasta, rice, bread and fruit. You can't drink fruit juice or any alcohol. You focus on eating lean protein, such as seafood, skinless poultry, lean beef and soy products. You can also eat high-fiber vegetables, low-fat dairy and foods with healthy, unsaturated fats, including avocados, nuts and seeds. Phase 2. This is a long-term weight-loss phase. You begin adding back some of the foods that were prohibited in phase 1, such as whole-grain breads, whole-wheat pasta, brown rice, fruits and more vegetables. You stay in this phase until you reach your goal weight. Phase 3. This is a maintenance phase meant to be a healthy way to eat for life. You continue to follow the lifestyle principles you learned in the two previous phases. You can eat all types of foods in moderation. A typical day's menu on the [place] a look at what you might eat during a typical day in phase 1 of the [place] might be an omelet with smoked salmon or baked eggs with spinach and ham, along with a cup of coffee or tea. Lunch. Lunch might be a vegetable salad with scallops or shrimp, along with iced tea or sparkling water. Dinner. Dinner may feature grilled tuna or pork paired with grilled vegetables and a salad. Dessert. The diet encourages you to enjoy a dessert, such as a ricotta cheesecake or chilled espresso custard, even in phase 1. Snacks. You can enjoy snacks during the day, too, such as a [place] cheese and [place] roll-up or roasted chickpeas. Results Weight loss The [place] says that you'll lose 8 to 13 pounds (3.6 to 5.9 kilograms) in the two-week period that you're in phase 1. It also says that most of the weight will be shed from your midsection. In phase 2, it says that you'll likely lose 1 to  2 pounds (0.5 to 1 kilogram) a week.  Most people can lose weight on almost any diet, especially in the short term. Most important to weight loss is how many calories you take in and how many calories you burn off. A weight loss of 1 to 2 pounds a week is the typical recommendation. Although it may seem slow, it's a pace that's more likely to help you maintain your weight loss permanently.  Losing a large amount of weight rapidly could indicate that you're losing water weight or lean tissue, rather than fat. In some situations, however, faster weight loss can be safe if it's done in a healthy way. For example, some diets include an initiation phase to help you jump-start your weight loss, including the [place].  Health benefits The [facility] Diet, while mainly directed at weight loss, may promote certain healthy changes. Research shows that following a long-term eating plan that's rich in healthy carbohydrates and dietary fats, such as whole grains, unsaturated fats, vegetables and fruits, can improve your health. For  example, lower carbohydrate diets with healthy fats may improve your blood cholesterol levels.  Risks The [facility] Diet is generally safe if you follow it as outlined in official [employer] and websites. However, if you severely restrict your carbohydrates, you may experience problems from ketosis. Ketosis occurs when you don't have enough sugar (glucose) for energy, so your body breaks down stored fat, causing ketones to build up in your body. Side effects from ketosis can include nausea, headache, mental fatigue and bad breath, and sometimes dehydration and dizziness.

## 2016-12-22 NOTE — Progress Notes (Signed)
ADVANCED HF CLINIC CONSULT NOTE  Date:  12/22/2016   ID:  [name], DOB [birth year], MRN [id number]   PCP:  DAVIS,STEPHEN, MD  CARDIOLOGIST: Rockey Situ  HPI:   [name] is a 70 year old gentleman with history of diffuse large B-cell lymphoma of the leg diagnosed 02/2010 s/p  RCHOP & XRT 2011, morbid obesity, OSA on CPAP and nighttime O2, HTN, HL, CAD who is referred by Dr. 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   He is here with his wife. He is a non-smoker though has been exposed to a lot of second-hand smoke. Had cardiac cath with Dr. Sabra Heck over 20 years ago for CP with non-obstructive CAD (~40% blockages).   Works as a Theme park manager currently. Gets SOB with minimal exertion such as walking the driveway. Slowly getting worse. No chest pain or pressure. Struggling with right knee pain and swelling but no other pedal edema. Echo 1/18 LVEF 50-55% grade I DD RV dilated with RVSP 60s.(images reviewed personally). Occasional ab bloating. Not watching intake very well. Drinks a lot of Diet Soda. Tries to watch his salt intake. Taking lasix 10mg  daily. Gets dizzy if he takes more.   Weight runs about 230. Graduated HS at 225. 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 Wears CPAP regularly. Also wears O2 when he sleeps.   Saw Dr. Maggie Schwalbe (Pulmonary) in [place] 2 weeks ago. Walked him around Clinic and sats dropped to 89%. HR went up very high. She placed ONOX which was ok.    Recent ECG with normal sinus rhythm with rate 75 bpm, right bundle branch block     PMH:   has a past medical history of Diabetes mellitus without complication ([place]); lymphoma; Hyperlipidemia; Hypertension; Hypotestosteronemia; and Sleep apnea.  PSH:    Past Surgical History:  Procedure Laterality Date  . CARDIAC CATHETERIZATION     Dr. Sabra Heck    Current Outpatient Prescriptions  Medication Sig Dispense Refill  . Ascorbic Acid (VITAMIN C) 1000 MG tablet Take 1,000 mg by mouth daily.    Marland Kitchen aspirin 81 MG tablet Take 81 mg  by mouth daily.    Marland Kitchen atorvastatin (LIPITOR) 20 MG tablet Take 20 mg by mouth daily.    Marland Kitchen azithromycin (ZITHROMAX) 250 MG tablet Take by mouth daily.    . Calcium Carbonate (CALCIUM 600 PO) Take by mouth daily.    Marland Kitchen doxazosin (CARDURA) 8 MG tablet Take 1 tablet (8 mg total) by mouth daily. 90 tablet 6  . furosemide (LASIX) 20 MG tablet Take 1 tablet (20 mg total) by mouth daily. Takes a 1/2 pill 90 tablet 3  . Glucosamine-Chondroitin (MOVE FREE PO) Take by mouth daily.    . Glucosamine-Chondroitin-Vit C 2000-1200-60 MG/30ML LIQD Take by mouth daily.    . hydrochlorothiazide (HYDRODIURIL) 25 MG tablet Take 25 mg by mouth daily.    . Inulin (FIBER CHOICE PO) Take by mouth daily.    Marland Kitchen lisinopril (PRINIVIL,ZESTRIL) 40 MG tablet Take 40 mg by mouth daily.    . Magnesium 250 MG TABS Take by mouth daily.    . metFORMIN (GLUCOPHAGE) 500 MG tablet Take 500 mg by mouth 2 (two) times daily with a meal.    . Methylsulfonylmethane (MSM) 500 MG CAPS Take by mouth daily.    . metoprolol succinate (TOPROL-XL) 50 MG 24 hr tablet Take 1 tablet (50 mg total) by mouth daily. Take with or immediately following a meal. 90 tablet 3  . Multiple Vitamin (MULTIVITAMIN) tablet Take 1 tablet by mouth daily.    Marland Kitchen  Omega-3 Fatty Acids (OMEGA 3 PO) Take by mouth daily.    . pantoprazole (PROTONIX) 40 MG tablet Take 40 mg by mouth daily.    . potassium chloride (K-DUR) 10 MEQ tablet Take 1 tablet (10 mEq total) by mouth daily. 90 tablet 3  . spironolactone (ALDACTONE) 25 MG tablet Take 25 mg by mouth daily.    . vitamin E 400 UNIT capsule Take 400 Units by mouth daily.     No current facility-administered medications for this visit.      Allergies:   Penicillins   Social History:  The patient  reports that he has never smoked. He has never used smokeless tobacco. He reports that he does not drink alcohol or use drugs. Works as a Theme park manager  Family History:   family history includes Heart attack in his father; Heart disease  in his father. No FHX of PAH or SCD   Review of Systems: Review of Systems  Constitutional: Negative.   Respiratory: Positive for shortness of breath.   Cardiovascular: Negative.   Gastrointestinal: Negative.   Musculoskeletal: Negative.   Neurological: Negative.   Psychiatric/Behavioral: Negative.   All other systems reviewed and are negative.    PHYSICAL EXAM: VS:  BP 130/72   Pulse 70   Wt 237 lb 8 oz (107.7 kg)   SpO2 97%   BMI 35.59 kg/m  , BMI Body mass index is 35.59 kg/m. General:  Obese male. SOB on mild exertion HEENT: normal anicteric Neck: thick supple. no obvious JVD. Carotids 2+ bilat; no bruits. No lymphadenopathy or thryomegaly appreciated. Cor: PMI nonpalpable  Regular rate & rhythm. Soft TR murmur. S2 ok. No RV lift. P2 split Lungs: clear with decreased BS throughout. No wheeze  Abdomen: marked central obesity soft, nontender, nondistended. No hepatosplenomegaly. No bruits or masses. Good bowel sounds. Extremities: no cyanosis, clubbing, rash, edema Neuro: alert & orientedx3, cranial nerves grossly intact. moves all 4 extremities w/o difficulty. Affect pleasant   Recent Labs: 09/22/2016: ALT 33 10/06/2016: BUN 29; Creatinine, Ser 1.36; Potassium 4.6; Sodium 141    Lipid Panel Lab Results  Component Value Date   CHOL 137 09/22/2016   HDL 42 09/22/2016   LDLCALC 68 09/22/2016   TRIG 136 09/22/2016      Wt Readings from Last 3 Encounters:  12/22/16 237 lb 8 oz (107.7 kg)  10/30/16 237 lb (107.5 kg)  09/22/16 235 lb (106.6 kg)     ASSESSMENT AND PLAN:  1. Pulmonary HTN with evidence of RV strain on echo  --We had a long talk about the various etiologies of pulmonary HTN. 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 Diastolic parameters not completely evaluated on echo (no LV tissue doppler performed)  --Discussed need for R/L hear cath to further  evaluate --We also discussed that diastolic HF was currently well treated with lasix and BP control. -- Said therapy will likely comprise of O2 as needed (avoid hypoxemia), pulmonary rehab (we referred), CPAP and stressed absolute need for weight loss to reverse hypoventilation with minimum of 25 pound weight loss required --Will need VQ scan down the road to complete work-up  2. Coronary calcifications on CT --Need coronary angio at time of RHC to evaluate for progressive CAD as contributor to his symptoms.  3. OSA --Continue CPAP  4. Morbid obesity --As above, stressed critical need for weight loss or symptoms will not improve.   Total time  spent 45 minutes. Over half that time spent discussing above.   Glori Bickers, MD  11:50 PM     Signed, Esmond Plants, M.D., Ph.D. 12/22/2016  [place], [place]

## 2017-01-02 ENCOUNTER — Encounter (HOSPITAL_COMMUNITY)
Admission: RE | Disposition: A | Discharge status: Home / Self Care | Payer: Self-pay | Source: Ambulatory Visit | Attending: Internal Medicine

## 2017-01-02 ENCOUNTER — Ambulatory Visit (HOSPITAL_COMMUNITY)
Admission: RE | Admit: 2017-01-02 | Discharge: 2017-01-02 | Disposition: A | Discharge status: Home / Self Care | Payer: Medicare Other | Source: Ambulatory Visit | Attending: Internal Medicine | Admitting: Internal Medicine

## 2017-01-02 DIAGNOSIS — Z7984 Long term (current) use of oral hypoglycemic drugs: Secondary | ICD-10-CM | POA: Insufficient documentation

## 2017-01-02 DIAGNOSIS — I272 Pulmonary hypertension, unspecified: Secondary | ICD-10-CM

## 2017-01-02 DIAGNOSIS — I2584 Coronary atherosclerosis due to calcified coronary lesion: Secondary | ICD-10-CM | POA: Diagnosis not present

## 2017-01-02 DIAGNOSIS — I10 Essential (primary) hypertension: Secondary | ICD-10-CM | POA: Diagnosis not present

## 2017-01-02 DIAGNOSIS — I2510 Atherosclerotic heart disease of native coronary artery without angina pectoris: Secondary | ICD-10-CM | POA: Insufficient documentation

## 2017-01-02 DIAGNOSIS — Z7982 Long term (current) use of aspirin: Secondary | ICD-10-CM | POA: Diagnosis not present

## 2017-01-02 DIAGNOSIS — E119 Type 2 diabetes mellitus without complications: Secondary | ICD-10-CM | POA: Insufficient documentation

## 2017-01-02 DIAGNOSIS — G4733 Obstructive sleep apnea (adult) (pediatric): Secondary | ICD-10-CM | POA: Diagnosis not present

## 2017-01-02 DIAGNOSIS — Z6835 Body mass index (BMI) 35.0-35.9, adult: Secondary | ICD-10-CM | POA: Diagnosis not present

## 2017-01-02 DIAGNOSIS — I4510 Unspecified right bundle-branch block: Secondary | ICD-10-CM | POA: Insufficient documentation

## 2017-01-02 DIAGNOSIS — Z8249 Family history of ischemic heart disease and other diseases of the circulatory system: Secondary | ICD-10-CM | POA: Insufficient documentation

## 2017-01-02 DIAGNOSIS — Z923 Personal history of irradiation: Secondary | ICD-10-CM | POA: Insufficient documentation

## 2017-01-02 DIAGNOSIS — Z880 Allergy status to penicillin: Secondary | ICD-10-CM | POA: Diagnosis not present

## 2017-01-02 DIAGNOSIS — E785 Hyperlipidemia, unspecified: Secondary | ICD-10-CM | POA: Insufficient documentation

## 2017-01-02 DIAGNOSIS — I2721 Secondary pulmonary arterial hypertension: Secondary | ICD-10-CM | POA: Insufficient documentation

## 2017-01-02 DIAGNOSIS — Z8572 Personal history of non-Hodgkin lymphomas: Secondary | ICD-10-CM | POA: Insufficient documentation

## 2017-01-02 HISTORY — PX: RIGHT/LEFT HEART CATH AND CORONARY ANGIOGRAPHY: CATH118266

## 2017-01-02 LAB — POCT I-STAT 3, VENOUS BLOOD GAS (G3P V)
Acid-base deficit: 1 mmol/L (ref 0.0–2.0)
Acid-base deficit: 1 mmol/L (ref 0.0–2.0)
Acid-base deficit: 1 mmol/L (ref 0.0–2.0)
Acid-base deficit: 1 mmol/L (ref 0.0–2.0)
Acid-base deficit: 3 mmol/L — ABNORMAL HIGH (ref 0.0–2.0)
Acid-base deficit: 4 mmol/L — ABNORMAL HIGH (ref 0.0–2.0)
Bicarbonate: 21.2 mmol/L (ref 20.0–28.0)
Bicarbonate: 22 mmol/L (ref 20.0–28.0)
Bicarbonate: 24.3 mmol/L (ref 20.0–28.0)
Bicarbonate: 24.5 mmol/L (ref 20.0–28.0)
Bicarbonate: 24.5 mmol/L (ref 20.0–28.0)
Bicarbonate: 24.6 mmol/L (ref 20.0–28.0)
Bicarbonate: 24.9 mmol/L (ref 20.0–28.0)
O2 Saturation: 70 %
O2 Saturation: 83 %
O2 Saturation: 84 %
O2 Saturation: 85 %
O2 Saturation: 86 %
O2 Saturation: 88 %
O2 Saturation: 99 %
TCO2: 22 mmol/L (ref 0–100)
TCO2: 23 mmol/L (ref 0–100)
TCO2: 26 mmol/L (ref 0–100)
TCO2: 26 mmol/L (ref 0–100)
TCO2: 26 mmol/L (ref 0–100)
TCO2: 26 mmol/L (ref 0–100)
TCO2: 26 mmol/L (ref 0–100)
pCO2, Ven: 37.7 mmHg — ABNORMAL LOW (ref 44.0–60.0)
pCO2, Ven: 38.6 mmHg — ABNORMAL LOW (ref 44.0–60.0)
pCO2, Ven: 42.3 mmHg — ABNORMAL LOW (ref 44.0–60.0)
pCO2, Ven: 43.1 mmHg — ABNORMAL LOW (ref 44.0–60.0)
pCO2, Ven: 43.7 mmHg — ABNORMAL LOW (ref 44.0–60.0)
pCO2, Ven: 44 mmHg (ref 44.0–60.0)
pCO2, Ven: 44.4 mmHg (ref 44.0–60.0)
pH, Ven: 7.348 (ref 7.250–7.430)
pH, Ven: 7.35 (ref 7.250–7.430)
pH, Ven: 7.355 (ref 7.250–7.430)
pH, Ven: 7.357 (ref 7.250–7.430)
pH, Ven: 7.36 (ref 7.250–7.430)
pH, Ven: 7.375 (ref 7.250–7.430)
pH, Ven: 7.378 (ref 7.250–7.430)
pO2, Ven: 131 mmHg — ABNORMAL HIGH (ref 32.0–45.0)
pO2, Ven: 38 mmHg (ref 32.0–45.0)
pO2, Ven: 50 mmHg — ABNORMAL HIGH (ref 32.0–45.0)
pO2, Ven: 50 mmHg — ABNORMAL HIGH (ref 32.0–45.0)
pO2, Ven: 52 mmHg — ABNORMAL HIGH (ref 32.0–45.0)
pO2, Ven: 53 mmHg — ABNORMAL HIGH (ref 32.0–45.0)
pO2, Ven: 56 mmHg — ABNORMAL HIGH (ref 32.0–45.0)

## 2017-01-02 LAB — GLUCOSE, CAPILLARY: Glucose-Capillary: 106 mg/dL — ABNORMAL HIGH (ref 65–99)

## 2017-01-02 LAB — POCT I-STAT 3, ART BLOOD GAS (G3+)
Acid-base deficit: 2 mmol/L (ref 0.0–2.0)
Bicarbonate: 23 mmol/L (ref 20.0–28.0)
O2 Saturation: 94 %
TCO2: 24 mmol/L (ref 0–100)
pCO2 arterial: 39.8 mmHg (ref 32.0–48.0)
pH, Arterial: 7.37 (ref 7.350–7.450)
pO2, Arterial: 73 mmHg — ABNORMAL LOW (ref 83.0–108.0)

## 2017-01-02 LAB — POCT ACTIVATED CLOTTING TIME: Activated Clotting Time: 197 seconds

## 2017-01-02 SURGERY — RIGHT/LEFT HEART CATH AND CORONARY ANGIOGRAPHY
Anesthesia: LOCAL

## 2017-01-02 MED ORDER — HEPARIN SODIUM (PORCINE) 1000 UNIT/ML IJ SOLN
INTRAMUSCULAR | Status: AC
  Filled 2017-01-02: qty 1

## 2017-01-02 MED ORDER — SODIUM CHLORIDE 0.9% FLUSH: 3.0000 mL | INTRAVENOUS | Status: DC | PRN

## 2017-01-02 MED ORDER — VERAPAMIL HCL 2.5 MG/ML IV SOLN
INTRAVENOUS | Status: AC
  Filled 2017-01-02: qty 2

## 2017-01-02 MED ORDER — HEPARIN (PORCINE) IN NACL 2-0.9 UNIT/ML-% IJ SOLN
INTRAMUSCULAR | Status: DC | PRN
  Administered 2017-01-02: 10 mL via INTRA_ARTERIAL

## 2017-01-02 MED ORDER — LIDOCAINE HCL (PF) 1 % IJ SOLN
INTRAMUSCULAR | Status: AC
  Filled 2017-01-02: qty 30

## 2017-01-02 MED ORDER — SODIUM CHLORIDE 0.9 % IV SOLN
INTRAVENOUS | Status: DC
  Administered 2017-01-02: 11:00:00 via INTRAVENOUS

## 2017-01-02 MED ORDER — FENTANYL CITRATE (PF) 100 MCG/2ML IJ SOLN
INTRAMUSCULAR | Status: DC | PRN
  Administered 2017-01-02: 25 ug via INTRAVENOUS

## 2017-01-02 MED ORDER — SODIUM CHLORIDE 0.9 % IV SOLN: 250.0000 mL | INTRAVENOUS | Status: DC | PRN

## 2017-01-02 MED ORDER — MIDAZOLAM HCL 2 MG/2ML IJ SOLN
INTRAMUSCULAR | Status: DC | PRN
  Administered 2017-01-02: 1 mg via INTRAVENOUS

## 2017-01-02 MED ORDER — IOPAMIDOL (ISOVUE-370) INJECTION 76%
INTRAVENOUS | Status: DC | PRN
  Administered 2017-01-02: 60 mL via INTRA_ARTERIAL

## 2017-01-02 MED ORDER — LIDOCAINE HCL (PF) 1 % IJ SOLN
INTRAMUSCULAR | Status: DC | PRN
  Administered 2017-01-02 (×2): 2 mL

## 2017-01-02 MED ORDER — HEPARIN (PORCINE) IN NACL 2-0.9 UNIT/ML-% IJ SOLN
INTRAMUSCULAR | Status: AC
  Filled 2017-01-02: qty 1000

## 2017-01-02 MED ORDER — SODIUM CHLORIDE 0.9% FLUSH: 3.0000 mL | Freq: Two times a day (BID) | INTRAVENOUS | Status: DC

## 2017-01-02 MED ORDER — ASPIRIN 81 MG PO CHEW: 81.0000 mg | CHEWABLE_TABLET | ORAL | Status: DC

## 2017-01-02 MED ORDER — MIDAZOLAM HCL 2 MG/2ML IJ SOLN
INTRAMUSCULAR | Status: AC
  Filled 2017-01-02: qty 2

## 2017-01-02 MED ORDER — HEPARIN SODIUM (PORCINE) 1000 UNIT/ML IJ SOLN
INTRAMUSCULAR | Status: DC | PRN
  Administered 2017-01-02: 5000 [IU] via INTRAVENOUS

## 2017-01-02 MED ORDER — FENTANYL CITRATE (PF) 100 MCG/2ML IJ SOLN
INTRAMUSCULAR | Status: AC
  Filled 2017-01-02: qty 2

## 2017-01-02 MED ORDER — HEPARIN (PORCINE) IN NACL 2-0.9 UNIT/ML-% IJ SOLN
INTRAMUSCULAR | Status: DC | PRN
  Administered 2017-01-02: 1000 mL

## 2017-01-02 SURGICAL SUPPLY — 14 items
CATH BALLN WEDGE 5F 110CM (CATHETERS) ×2 IMPLANT
CATH INFINITI 5 FR JL3.5 (CATHETERS) ×2 IMPLANT
CATH INFINITI JR4 5F (CATHETERS) ×2 IMPLANT
DEVICE RAD COMP TR BAND LRG (VASCULAR PRODUCTS) ×2 IMPLANT
GLIDESHEATH SLEND SS 6F .021 (SHEATH) ×2 IMPLANT
GUIDEWIRE INQWIRE 1.5J.035X260 (WIRE) ×1 IMPLANT
INQWIRE 1.5J .035X260CM (WIRE) ×2
KIT HEART LEFT (KITS) ×2 IMPLANT
PACK CARDIAC CATHETERIZATION (CUSTOM PROCEDURE TRAY) ×2 IMPLANT
SHEATH GLIDE SLENDER 4/5FR (SHEATH) ×2 IMPLANT
TRANSDUCER W/STOPCOCK (MISCELLANEOUS) ×2 IMPLANT
TUBING CIL FLEX 10 FLL-RA (TUBING) ×2 IMPLANT
WIRE EMERALD 3MM-J .025X260CM (WIRE) ×2 IMPLANT
WIRE HI TORQ VERSACORE-J 145CM (WIRE) ×2 IMPLANT

## 2017-01-02 NOTE — Interval H&P Note (Signed)
History and Physical Interval Note:  01/02/2017 12:13 PM  [name]  has presented today for surgery, with the diagnosis of pulmonary hypertension  The various methods of treatment have been discussed with the patient and family. After consideration of risks, benefits and other options for treatment, the patient has consented to  Procedure(s): Right/Left Heart Cath and Coronary Angiography (N/A) and possible coronary angioplasty as a surgical intervention .  The patient's history has been reviewed, patient examined, no change in status, stable for surgery.  I have reviewed the patient's chart and labs.  Questions were answered to the patient's satisfaction.     Tenlee Wollin, Quillian Quince

## 2017-01-02 NOTE — H&P (View-Only) (Signed)
ADVANCED HF CLINIC CONSULT NOTE  Date:  12/22/2016   ID:  [name], DOB 05/21/1946, MRN [id number]   PCP:  DAVIS,STEPHEN, MD  CARDIOLOGIST: Rockey Situ  HPI:   Mr. 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   He is here with his wife. He is a non-smoker though has been exposed to a lot of second-hand smoke. Had cardiac cath with Dr. Sabra Heck over 20 years ago for CP with non-obstructive CAD (~40% blockages).   Works as a Theme park manager currently. Gets SOB with minimal exertion such as walking the driveway. Slowly getting worse. No chest pain or pressure. Struggling with right knee pain and swelling but no other pedal edema. Echo 1/18 LVEF 50-55% grade I DD RV dilated with RVSP 60s.(images reviewed personally). Occasional ab bloating. Not watching intake very well. Drinks a lot of Diet Soda. Tries to watch his salt intake. Taking lasix 10mg  daily. Gets dizzy if he takes more.   Weight runs about 230. Graduated HS at 225. 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 Wears CPAP regularly. Also wears O2 when he sleeps.   Saw Dr. Maggie Schwalbe (Pulmonary) in [place] 2 weeks ago. Walked him around Clinic and sats dropped to 89%. HR went up very high. She placed ONOX which was ok.    Recent ECG with normal sinus rhythm with rate 75 bpm, right bundle branch block     PMH:   has a past medical history of Diabetes mellitus without complication ([place]); lymphoma; Hyperlipidemia; Hypertension; Hypotestosteronemia; and Sleep apnea.  PSH:    Past Surgical History:  Procedure Laterality Date  . CARDIAC CATHETERIZATION     Dr. Sabra Heck    Current Outpatient Prescriptions  Medication Sig Dispense Refill  . Ascorbic Acid (VITAMIN C) 1000 MG tablet Take 1,000 mg by mouth daily.    Marland Kitchen aspirin 81 MG tablet Take 81 mg  by mouth daily.    Marland Kitchen atorvastatin (LIPITOR) 20 MG tablet Take 20 mg by mouth daily.    Marland Kitchen azithromycin (ZITHROMAX) 250 MG tablet Take by mouth daily.    . Calcium Carbonate (CALCIUM 600 PO) Take by mouth daily.    Marland Kitchen doxazosin (CARDURA) 8 MG tablet Take 1 tablet (8 mg total) by mouth daily. 90 tablet 6  . furosemide (LASIX) 20 MG tablet Take 1 tablet (20 mg total) by mouth daily. Takes a 1/2 pill 90 tablet 3  . Glucosamine-Chondroitin (MOVE FREE PO) Take by mouth daily.    . Glucosamine-Chondroitin-Vit C 2000-1200-60 MG/30ML LIQD Take by mouth daily.    . hydrochlorothiazide (HYDRODIURIL) 25 MG tablet Take 25 mg by mouth daily.    . Inulin (FIBER CHOICE PO) Take by mouth daily.    Marland Kitchen lisinopril (PRINIVIL,ZESTRIL) 40 MG tablet Take 40 mg by mouth daily.    . Magnesium 250 MG TABS Take by mouth daily.    . metFORMIN (GLUCOPHAGE) 500 MG tablet Take 500 mg by mouth 2 (two) times daily with a meal.    . Methylsulfonylmethane (MSM) 500 MG CAPS Take by mouth daily.    . metoprolol succinate (TOPROL-XL) 50 MG 24 hr tablet Take 1 tablet (50 mg total) by mouth daily. Take with or immediately following a meal. 90 tablet 3  . Multiple Vitamin (MULTIVITAMIN) tablet Take 1 tablet by mouth daily.    Marland Kitchen  Omega-3 Fatty Acids (OMEGA 3 PO) Take by mouth daily.    . pantoprazole (PROTONIX) 40 MG tablet Take 40 mg by mouth daily.    . potassium chloride (K-DUR) 10 MEQ tablet Take 1 tablet (10 mEq total) by mouth daily. 90 tablet 3  . spironolactone (ALDACTONE) 25 MG tablet Take 25 mg by mouth daily.    . vitamin E 400 UNIT capsule Take 400 Units by mouth daily.     No current facility-administered medications for this visit.      Allergies:   Penicillins   Social History:  The patient  reports that he has never smoked. He has never used smokeless tobacco. He reports that he does not drink alcohol or use drugs. Works as a Theme park manager  Family History:   family history includes Heart attack in his father; Heart disease  in his father. No FHX of PAH or SCD   Review of Systems: Review of Systems  Constitutional: Negative.   Respiratory: Positive for shortness of breath.   Cardiovascular: Negative.   Gastrointestinal: Negative.   Musculoskeletal: Negative.   Neurological: Negative.   Psychiatric/Behavioral: Negative.   All other systems reviewed and are negative.    PHYSICAL EXAM: VS:  BP 130/72   Pulse 70   Wt 237 lb 8 oz (107.7 kg)   SpO2 97%   BMI 35.59 kg/m  , BMI Body mass index is 35.59 kg/m. General:  Obese male. SOB on mild exertion HEENT: normal anicteric Neck: thick supple. no obvious JVD. Carotids 2+ bilat; no bruits. No lymphadenopathy or thryomegaly appreciated. Cor: PMI nonpalpable  Regular rate & rhythm. Soft TR murmur. S2 ok. No RV lift. P2 split Lungs: clear with decreased BS throughout. No wheeze  Abdomen: marked central obesity soft, nontender, nondistended. No hepatosplenomegaly. No bruits or masses. Good bowel sounds. Extremities: no cyanosis, clubbing, rash, edema Neuro: alert & orientedx3, cranial nerves grossly intact. moves all 4 extremities w/o difficulty. Affect pleasant   Recent Labs: 09/22/2016: ALT 33 10/06/2016: BUN 29; Creatinine, Ser 1.36; Potassium 4.6; Sodium 141    Lipid Panel Lab Results  Component Value Date   CHOL 137 09/22/2016   HDL 42 09/22/2016   LDLCALC 68 09/22/2016   TRIG 136 09/22/2016      Wt Readings from Last 3 Encounters:  12/22/16 237 lb 8 oz (107.7 kg)  10/30/16 237 lb (107.5 kg)  09/22/16 235 lb (106.6 kg)     ASSESSMENT AND PLAN:  1. Pulmonary HTN with evidence of RV strain on echo  --We had a long talk about the various etiologies of pulmonary HTN. 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 Diastolic parameters not completely evaluated on echo (no LV tissue doppler performed)  --Discussed need for R/L hear cath to further  evaluate --We also discussed that diastolic HF was currently well treated with lasix and BP control. -- Said therapy will likely comprise of O2 as needed (avoid hypoxemia), pulmonary rehab (we referred), CPAP and stressed absolute need for weight loss to reverse hypoventilation with minimum of 25 pound weight loss required --Will need VQ scan down the road to complete work-up  2. Coronary calcifications on CT --Need coronary angio at time of RHC to evaluate for progressive CAD as contributor to his symptoms.  3. OSA --Continue CPAP  4. Morbid obesity --As above, stressed critical need for weight loss or symptoms will not improve.   Total time  spent 45 minutes. Over half that time spent discussing above.   Glori Bickers, MD  11:50 PM     Signed, Esmond Plants, M.D., Ph.D. 12/22/2016  [place], [place]

## 2017-01-02 NOTE — Discharge Instructions (Signed)
Radial Site Care °Refer to this sheet in the next few weeks. These instructions provide you with information about caring for yourself after your procedure. Your health care provider may also give you more specific instructions. Your treatment has been planned according to current medical practices, but problems sometimes occur. Call your health care provider if you have any problems or questions after your procedure. °What can I expect after the procedure? °After your procedure, it is typical to have the following: °· Bruising at the radial site that usually fades within 1-2 weeks. °· Blood collecting in the tissue (hematoma) that may be painful to the touch. It should usually decrease in size and tenderness within 1-2 weeks. °Follow these instructions at home: °· Take medicines only as directed by your health care provider. °· You may shower 24-48 hours after the procedure or as directed by your health care provider. Remove the bandage (dressing) and gently wash the site with plain soap and water. Pat the area dry with a clean towel. Do not rub the site, because this may cause bleeding. °· Do not take baths, swim, or use a hot tub until your health care provider approves. °· Check your insertion site every day for redness, swelling, or drainage. °· Do not apply powder or lotion to the site. °· Do not flex or bend the affected arm for 24 hours or as directed by your health care provider. °· Do not push or pull heavy objects with the affected arm for 24 hours or as directed by your health care provider. °· Do not lift over 10 lb (4.5 kg) for 5 days after your procedure or as directed by your health care provider. °· Ask your health care provider when it is okay to: °¨ Return to work or school. °¨ Resume usual physical activities or sports. °¨ Resume sexual activity. °· Do not drive home if you are discharged the same day as the procedure. Have someone else drive you. °· You may drive 24 hours after the procedure  unless otherwise instructed by your health care provider. °· Do not operate machinery or power tools for 24 hours after the procedure. °· If your procedure was done as an outpatient procedure, which means that you went home the same day as your procedure, a responsible adult should be with you for the first 24 hours after you arrive home. °· Keep all follow-up visits as directed by your health care provider. This is important. °Contact a health care provider if: °· You have a fever. °· You have chills. °· You have increased bleeding from the radial site. Hold pressure on the site. °Get help right away if: °· You have unusual pain at the radial site. °· You have redness, warmth, or swelling at the radial site. °· You have drainage (other than a small amount of blood on the dressing) from the radial site. °· The radial site is bleeding, and the bleeding does not stop after 30 minutes of holding steady pressure on the site. °· Your arm or hand becomes pale, cool, tingly, or numb. °This information is not intended to replace advice given to you by your health care provider. Make sure you discuss any questions you have with your health care provider. °Document Released: 09/27/2010 Document Revised: 01/31/2016 Document Reviewed: 03/13/2014 °Elsevier Interactive Patient Education © 2017 [employer] ° °

## 2017-01-05 ENCOUNTER — Ambulatory Visit (HOSPITAL_COMMUNITY)
Admission: RE | Admit: 2017-01-05 | Discharge: 2017-01-05 | Disposition: A | Discharge status: Home / Self Care | Payer: Medicare Other | Source: Ambulatory Visit | Attending: Internal Medicine | Admitting: Internal Medicine

## 2017-01-05 ENCOUNTER — Encounter (HOSPITAL_COMMUNITY): Payer: Self-pay | Admitting: Internal Medicine

## 2017-01-05 DIAGNOSIS — I272 Pulmonary hypertension, unspecified: Secondary | ICD-10-CM

## 2017-01-05 LAB — PULMONARY FUNCTION TEST
DL/VA % pred: 113 %
DL/VA: 5.06 ml/min/mmHg/L
DLCO cor % pred: 91 %
DLCO cor: 27.12 ml/min/mmHg
DLCO unc % pred: 97 %
DLCO unc: 28.92 ml/min/mmHg
FEF 25-75 Post: 4.53 L/sec
FEF 25-75 Pre: 4.58 L/sec
FEF2575-%Change-Post: -1 %
FEF2575-%Pred-Post: 205 %
FEF2575-%Pred-Pre: 207 %
FEV1-%Change-Post: 0 %
FEV1-%Pred-Post: 102 %
FEV1-%Pred-Pre: 102 %
FEV1-Post: 3.03 L
FEV1-Pre: 3.04 L
FEV1FVC-%Change-Post: 3 %
FEV1FVC-%Pred-Pre: 117 %
FEV6-%Change-Post: -3 %
FEV6-%Pred-Post: 90 %
FEV6-%Pred-Pre: 93 %
FEV6-Post: 3.43 L
FEV6-Pre: 3.54 L
FEV6FVC-%Pred-Post: 106 %
FEV6FVC-%Pred-Pre: 106 %
FVC-%Change-Post: -3 %
FVC-%Pred-Post: 85 %
FVC-%Pred-Pre: 87 %
FVC-Post: 3.43 L
FVC-Pre: 3.54 L
Post FEV1/FVC ratio: 88 %
Post FEV6/FVC ratio: 100 %
Pre FEV1/FVC ratio: 86 %
Pre FEV6/FVC Ratio: 100 %
RV % pred: 177 %
RV: 4.2 L
TLC % pred: 118 %
TLC: 7.85 L

## 2017-01-05 MED ORDER — ALBUTEROL SULFATE (2.5 MG/3ML) 0.083% IN NEBU
2.5000 mg | INHALATION_SOLUTION | Freq: Once | RESPIRATORY_TRACT | Status: AC
  Administered 2017-01-05: 2.5 mg via RESPIRATORY_TRACT

## 2017-01-27 ENCOUNTER — Telehealth: Payer: Self-pay

## 2017-01-27 NOTE — Telephone Encounter (Signed)
Received signed roi from [facility] requesting cath report and pft.  Routed vi epic scanned roi 01/27/17 [facility]

## 2017-03-04 ENCOUNTER — Ambulatory Visit (HOSPITAL_COMMUNITY)
Admission: RE | Admit: 2017-03-04 | Discharge: 2017-03-04 | Disposition: A | Discharge status: Home / Self Care | Payer: Medicare Other | Source: Ambulatory Visit | Attending: Internal Medicine | Admitting: Internal Medicine

## 2017-03-04 ENCOUNTER — Encounter (HOSPITAL_COMMUNITY): Payer: Self-pay | Admitting: Internal Medicine

## 2017-03-04 VITALS — BP 130/74 | HR 72 | Wt 239.2 lb

## 2017-03-04 DIAGNOSIS — R0600 Dyspnea, unspecified: Secondary | ICD-10-CM

## 2017-03-04 DIAGNOSIS — E119 Type 2 diabetes mellitus without complications: Secondary | ICD-10-CM | POA: Insufficient documentation

## 2017-03-04 DIAGNOSIS — I272 Pulmonary hypertension, unspecified: Secondary | ICD-10-CM | POA: Diagnosis not present

## 2017-03-04 DIAGNOSIS — Z8572 Personal history of non-Hodgkin lymphomas: Secondary | ICD-10-CM | POA: Diagnosis not present

## 2017-03-04 DIAGNOSIS — Z923 Personal history of irradiation: Secondary | ICD-10-CM | POA: Insufficient documentation

## 2017-03-04 DIAGNOSIS — G4733 Obstructive sleep apnea (adult) (pediatric): Secondary | ICD-10-CM | POA: Diagnosis not present

## 2017-03-04 DIAGNOSIS — Z7984 Long term (current) use of oral hypoglycemic drugs: Secondary | ICD-10-CM | POA: Insufficient documentation

## 2017-03-04 DIAGNOSIS — Z8249 Family history of ischemic heart disease and other diseases of the circulatory system: Secondary | ICD-10-CM | POA: Diagnosis not present

## 2017-03-04 DIAGNOSIS — Z7982 Long term (current) use of aspirin: Secondary | ICD-10-CM | POA: Diagnosis not present

## 2017-03-04 DIAGNOSIS — I2510 Atherosclerotic heart disease of native coronary artery without angina pectoris: Secondary | ICD-10-CM | POA: Insufficient documentation

## 2017-03-04 DIAGNOSIS — I10 Essential (primary) hypertension: Secondary | ICD-10-CM | POA: Diagnosis not present

## 2017-03-04 DIAGNOSIS — E785 Hyperlipidemia, unspecified: Secondary | ICD-10-CM | POA: Insufficient documentation

## 2017-03-04 DIAGNOSIS — M1711 Unilateral primary osteoarthritis, right knee: Secondary | ICD-10-CM | POA: Insufficient documentation

## 2017-03-04 DIAGNOSIS — Q264 Anomalous pulmonary venous connection, unspecified: Secondary | ICD-10-CM

## 2017-03-04 DIAGNOSIS — R0609 Other forms of dyspnea: Secondary | ICD-10-CM | POA: Insufficient documentation

## 2017-03-04 NOTE — Progress Notes (Signed)
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    He is a non-smoker though has been exposed to a lot of second-hand smoke. Had cardiac cath with Dr. Sabra Heck over 20 years ago for CP with non-obstructive CAD (~40% blockages).   Follows with Dr. Maggie Schwalbe (Pulmonary) in [place]. Found to have exertional desats to 89%. She placed ONOX which showed a desat. He has f/u with her tomorrow  Referred by Dr. Rockey Situ in April 2018 for further evaluation of [place] on echo. Echo 1/18 LVEF 50-55% grade I DD RV dilated with RVSP 60s  Underwent R/L cath on 01/02/17   Dist LAD lesion, 20 %stenosed.  The left ventricular ejection fraction is 55-65% by visual estimate.   Findings:  RA = 8 RV = 35/10 PA = 36/11 (21) PCW = 10 Ao = 104/65 (83) LV = 113/13 Fick cardiac output/index = 12.3/5.7 Ao sat = 94% High SVC sat = 70% Mid SVC sat 83% RA sat = 84% RV sat = 85% PA sat = 87%, 86% Anomalous PV sat = 99% Qp/Qs = 1.45  Assessment: 1. Minimal coronary plaque 2. Normal LV function EF 55-60% 3. Very mildly elevated pulmonary pressures (much lower than echo) 4. 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 Doing well except for his R knee arthritis. Limits him from exercising. Only mild DOE. Wears CPAP and O2 at night. No orthopnea, PND or edema. Trying to watch intake a bit better. Weight stable 227-230   PMH:   has a past medical history of Diabetes mellitus without complication ([place]); lymphoma;  Hyperlipidemia; Hypertension; Hypotestosteronemia; and Sleep apnea.  PSH:    Past Surgical History:  Procedure Laterality Date  . CARDIAC CATHETERIZATION     Dr. Sabra Heck  . RIGHT/LEFT HEART CATH AND CORONARY ANGIOGRAPHY N/A 01/02/2017   Procedure: Right/Left Heart Cath and Coronary Angiography;  Surgeon: Jolaine Artist, MD;  Location: [facility];  Service: Cardiovascular;  Laterality: N/A;    Current Outpatient Prescriptions  Medication Sig Dispense Refill  . Ascorbic Acid (VITAMIN C) 1000 MG tablet Take 1,000 mg by mouth daily.    Marland Kitchen aspirin 81 MG tablet Take 81 mg by mouth daily.    Marland Kitchen atorvastatin (LIPITOR) 20 MG tablet Take 20 mg by mouth daily.    Marland Kitchen azithromycin (ZITHROMAX) 250 MG tablet Take by mouth daily.    . Calcium Carbonate (CALCIUM 600 PO) Take by mouth daily.    Marland Kitchen doxazosin (CARDURA) 8 MG tablet Take 1 tablet (8 mg total) by mouth daily. 90 tablet 6  . furosemide (LASIX) 20 MG tablet Take 1 tablet (20 mg total) by mouth daily. Takes a 1/2 pill 90 tablet 3  . Glucosamine-Chondroitin (MOVE FREE PO) Take by mouth daily.    . Glucosamine-Chondroitin-Vit C 2000-1200-60 MG/30ML LIQD Take by mouth daily.    . hydrochlorothiazide (HYDRODIURIL) 25 MG tablet Take 25 mg by mouth daily.    . Inulin (FIBER  CHOICE PO) Take by mouth daily.    Marland Kitchen lisinopril (PRINIVIL,ZESTRIL) 40 MG tablet Take 40 mg by mouth daily.    . Magnesium 250 MG TABS Take by mouth daily.    . metFORMIN (GLUCOPHAGE) 500 MG tablet Take 500 mg by mouth 2 (two) times daily with a meal.    . Methylsulfonylmethane (MSM) 500 MG CAPS Take by mouth daily.    . metoprolol succinate (TOPROL-XL) 50 MG 24 hr tablet Take 1 tablet (50 mg total) by mouth daily. Take with or immediately following a meal. 90 tablet 3  . Multiple Vitamin (MULTIVITAMIN) tablet Take 1 tablet by mouth daily.    . Omega-3 Fatty Acids (OMEGA 3 PO) Take by mouth daily.    . pantoprazole (PROTONIX) 40 MG tablet Take 40 mg by mouth daily.    .  potassium chloride (K-DUR) 10 MEQ tablet Take 1 tablet (10 mEq total) by mouth daily. 90 tablet 3  . spironolactone (ALDACTONE) 25 MG tablet Take 25 mg by mouth daily.    . vitamin E 400 UNIT capsule Take 400 Units by mouth daily.     No current facility-administered medications for this visit.      Allergies:   Penicillins   Social History:  The patient  reports that he has never smoked. He has never used smokeless tobacco. He reports that he does not drink alcohol or use drugs. Works as a Theme park manager  Family History:   family history includes Heart attack in his father; Heart disease in his father. No FHX of PAH or SCD   Review of Systems: Review of Systems  Constitutional: Negative.   Respiratory: Positive for shortness of breath.   Cardiovascular: Negative.   Gastrointestinal: Negative.   Musculoskeletal: Positive for joint pain.  Neurological: Negative.   Psychiatric/Behavioral: Negative.   All other systems reviewed and are negative.  Filed Weights   03/04/17 0908  Weight: 239 lb 4 oz (108.5 kg)     PHYSICAL EXAM: VS:  BP 130/74   Pulse 72   Wt 239 lb 4 oz (108.5 kg)   SpO2 98%   BMI 36.38 kg/m  , BMI Body mass index is 36.38 kg/m. General:  Well appearing. No resp difficulty HEENT: normal Neck: supple. no JVD. Carotids 2+ bilat; no bruits. No lymphadenopathy or thryomegaly appreciated. Cor: PMI nondisplaced. Regular rate & rhythm. 2/6 TR Lungs: clear Abdomen: obese soft, nontender, nondistended. No hepatosplenomegaly. No bruits or masses. Good bowel sounds. Extremities: no cyanosis, clubbing, rash, edema Neuro: alert & orientedx3, cranial nerves grossly intact. moves all 4 extremities w/o difficulty. Affect pleasant    Recent Labs: 09/22/2016: ALT 33 12/22/2016: BUN 34; Creatinine, Ser 1.28; Hemoglobin 17.2; Platelets 137; Potassium 3.9; Sodium 136    Lipid Panel Lab Results  Component Value Date   CHOL 137 09/22/2016   HDL 42 09/22/2016   LDLCALC 68  09/22/2016   TRIG 136 09/22/2016      Wt Readings from Last 3 Encounters:  03/04/17 239 lb 4 oz (108.5 kg)  01/02/17 228 lb (103.4 kg)  12/22/16 237 lb 8 oz (107.7 kg)     ASSESSMENT AND PLAN:  1. Pulmonary HTN with evidence of RV strain on echo  -- Cath results reviewed with him. 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 Continue medical management --Will continue CPAP and nightly O2 --Encouraged  to lose weight. --Consider Pulmonary Rehab  2. Coronary calcifications on CT --Cardiac cath with minimal CAD 4/18  3. OSA --Continue CPAP --Follows with Pulmonary. PFTs ok.  --Encouraged weight loss  4. Exertional dyspnea --Cath results discussed with him in detail. Suspect dyspnea multifactorial. Strongly suggested weight loss and regular exercise as most important interventions at this point.   Will follow-up with Dr. Melissa Noon, MD  9:47 AM

## 2017-03-04 NOTE — Patient Instructions (Signed)
CT scan of chest  Follow up as needed

## 2017-03-04 NOTE — Progress Notes (Signed)
Advanced Heart Failure Medication Review by a Pharmacist  Does the patient  feel that his/her medications are working for him/her?  yes  Has the patient been experiencing any side effects to the medications prescribed?  no  Does the patient measure his/her own blood pressure or blood glucose at home?  yes   Does the patient have any problems obtaining medications due to transportation or finances?   no  Understanding of regimen: good Understanding of indications: good Potential of compliance: good Patient understands to avoid NSAIDs. Patient understands to avoid decongestants.  Issues to address at subsequent visits: none.   Pharmacist comments: Mr. Hippe is a pleasant 71 y/o male who presents with a medication list. He reports taking metoprolol succinate XL 50 mg every morning and metoprolol succinate 50 mg half a tablet (25 mg)every evening to control his HR. He also reports taking furosemide 20 mg half a tablet (10 mg) every day, as a full tablet every day made him feel dry. Patient endorses good adherence to his medication regimen. Patient had no medication related questions or concerns for me at this time.   Phillis Knack PharmD Candidate   Time with patient: 10 minutes Preparation and documentation time: 5 minutes Total time: 15 minutes

## 2017-03-19 ENCOUNTER — Ambulatory Visit (HOSPITAL_COMMUNITY)
Admission: RE | Admit: 2017-03-19 | Discharge: 2017-03-19 | Disposition: A | Discharge status: Home / Self Care | Payer: Medicare Other | Source: Ambulatory Visit | Attending: Internal Medicine | Admitting: Internal Medicine

## 2017-03-19 DIAGNOSIS — Q264 Anomalous pulmonary venous connection, unspecified: Secondary | ICD-10-CM | POA: Diagnosis present

## 2017-03-19 DIAGNOSIS — I700 Atherosclerosis of aorta: Secondary | ICD-10-CM | POA: Insufficient documentation

## 2017-03-19 DIAGNOSIS — R0600 Dyspnea, unspecified: Secondary | ICD-10-CM | POA: Diagnosis present

## 2017-03-19 LAB — POCT I-STAT CREATININE: Creatinine, Ser: 1.5 mg/dL — ABNORMAL HIGH (ref 0.61–1.24)

## 2017-03-19 MED ORDER — IOPAMIDOL (ISOVUE-300) INJECTION 61%
75.0000 mL | Freq: Once | INTRAVENOUS | Status: AC | PRN
  Administered 2017-03-19: 75 mL via INTRAVENOUS

## 2017-03-23 ENCOUNTER — Telehealth (HOSPITAL_COMMUNITY): Payer: Self-pay | Admitting: *Deleted

## 2017-03-23 NOTE — Telephone Encounter (Signed)
CT Chest W Contrast  Order: [id number]  Status:  Final result Visible to patient:  Yes (MyChart) Dx:  Anomalous pulmonary venous connection...  Notes recorded by Darron Doom, RN on 03/23/2017 at 4:48 PM EDT Called and spoke with patient, he is aware and no further questions. ------  Notes recorded by Jolaine Artist, MD on 03/21/2017 at 4:10 PM EDT No interstitial lung disease

## 2017-04-27 DIAGNOSIS — C8335 Diffuse large B-cell lymphoma, lymph nodes of inguinal region and lower limb: Secondary | ICD-10-CM | POA: Insufficient documentation

## 2017-04-27 NOTE — Progress Notes (Signed)
Cardiology Office Note  Date:  04/28/2017   ID:  [name], DOB 1946/08/16, MRN [id number]  PCP:  Tommie Sams, MD   Chief Complaint  Patient presents with  . other    6 month follow up. Meds reviewed by the pt. verbally. 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 by his PCP around 6/12, He presents for follow-up of his hypertension, SOB, hyperlipidemia, Coronary disease seen on previous PET scans   In follow-up today he reports that his breathing is stable Biggest complaint is chronic knee pain 5 shots to knee, relief from the pain secondary to the shot only lasts 2 months Tried to exercise for weight loss,  Talking about knee replacement Aleve 2 a day  Denies any leg swelling, no change in his weight Tolerating medications Trying to change his diet but no significant change in the past year  Lab work reviewed with him creatinine up to 1.5 Was taking Lasix 20, whole pill with HCTZ and Aldactone Previously was taking half pill Lasix  Echo January 2018 with moderate pulm HTN  compliant with his CPAP  EKG on today's visit  shows normal sinus rhythm with rate 65 bpm, right bundle branch block   Other past medical history reviewed  previous testing  shows PET scan 2013 and 2015 showing coronary calcifications Other scanning showing mild aortic atherosclerosis  Previous lab work  total cholesterol in the 130 range, LDL 74, triglycerides 177 Testosterone 300, currently receives shot every 2  weeks Normal LFTs Elevated creatinine 1.4, BUN 32   PMH:   has a past medical history of Diabetes mellitus without complication ([place]); lymphoma; Hyperlipidemia; Hypertension; Hypotestosteronemia; and Sleep apnea.  PSH:    Past Surgical History:  Procedure Laterality Date  . CARDIAC CATHETERIZATION     Dr. Sabra Heck  . RIGHT/LEFT HEART CATH AND CORONARY ANGIOGRAPHY N/A 01/02/2017   Procedure: Right/Left Heart Cath and Coronary Angiography;  Surgeon: Jolaine Artist, MD;  Location: [facility];  Service: Cardiovascular;  Laterality: N/A;    Current Outpatient Prescriptions  Medication Sig Dispense Refill  . Ascorbic Acid (VITAMIN C) 1000 MG tablet Take 1,000 mg by mouth daily.    Marland Kitchen aspirin 81 MG tablet Take 81 mg by mouth daily.    Marland Kitchen atorvastatin (LIPITOR) 20 MG tablet Take 20 mg by mouth daily.    Marland Kitchen azithromycin (ZITHROMAX) 250 MG tablet Take by mouth daily.    . Calcium Carbonate (CALCIUM 600 PO) Take by mouth daily.    Marland Kitchen doxazosin (CARDURA) 8 MG tablet Take 1 tablet (8 mg total) by mouth daily. 90 tablet 6  . furosemide (LASIX) 20 MG tablet Take 1 tablet (20 mg total) by mouth daily. Takes a 1/2 pill 90 tablet 3  . Glucosamine-Chondroitin (MOVE FREE PO) Take by mouth daily.    . Glucosamine-Chondroitin-Vit C 2000-1200-60 MG/30ML LIQD Take by mouth daily.    . hydrochlorothiazide (HYDRODIURIL) 25 MG tablet Take 25 mg by mouth daily.    . Inulin (FIBER CHOICE PO) Take by mouth daily.    Marland Kitchen  lisinopril (PRINIVIL,ZESTRIL) 40 MG tablet Take 40 mg by mouth daily.    . Magnesium 250 MG TABS Take by mouth daily.    . metFORMIN (GLUCOPHAGE) 500 MG tablet Take 500 mg by mouth 2 (two) times daily with a meal.    . Methylsulfonylmethane (MSM) 500 MG CAPS Take by mouth daily.    . metoprolol succinate (TOPROL-XL) 50 MG 24 hr tablet Take 1 tablet (50 mg total) by mouth daily. Take with or immediately following a meal. 90 tablet 3  . Multiple Vitamin (MULTIVITAMIN) tablet Take 1 tablet by  mouth daily.    . Omega-3 Fatty Acids (OMEGA 3 PO) Take by mouth daily.    . pantoprazole (PROTONIX) 40 MG tablet Take 40 mg by mouth daily.    . potassium chloride (K-DUR) 10 MEQ tablet Take 1 tablet (10 mEq total) by mouth daily. 90 tablet 3  . spironolactone (ALDACTONE) 25 MG tablet Take 25 mg by mouth daily.    . vitamin E 400 UNIT capsule Take 400 Units by mouth daily.     No current facility-administered medications for this visit.      Allergies:   Penicillins   Social History:  The patient  reports that he has never smoked. He has never used smokeless tobacco. He reports that he does not drink alcohol or use drugs.   Family History:   family history includes Heart attack in his father; Heart disease in his father.    Review of Systems: Review of Systems  Constitutional: Negative.   Respiratory: Positive for shortness of breath.   Cardiovascular: Negative.   Gastrointestinal: Negative.   Musculoskeletal: Negative.   Neurological: Negative.   Psychiatric/Behavioral: Negative.   All other systems reviewed and are negative.    PHYSICAL EXAM: VS:  BP 118/72 (BP Location: Left Arm, Patient Position: Sitting, Cuff Size: Normal)   Pulse 65   Ht 5\' 9"  (1.753 m)   Wt 236 lb 8 oz (107.3 kg)   BMI 34.92 kg/m  , BMI Body mass index is 34.92 kg/m.  GEN: Well nourished, well developed, in no acute distress, obese  HEENT: normal  Neck: no JVD, carotid bruits, or masses Cardiac: RRR; no murmurs, rubs, or gallops,no edema  Respiratory:  clear to auscultation bilaterally, normal work of breathing GI: soft, nontender, nondistended, + BS MS: no deformity or atrophy  Skin: warm and dry, no rash Neuro:  Strength and sensation are intact Psych: euthymic mood, full affect    Recent Labs: 09/22/2016: ALT 33 12/22/2016: BUN 34; Hemoglobin 17.2; Platelets 137; Potassium 3.9; Sodium 136 03/19/2017: Creatinine, Ser 1.50    Lipid Panel Lab Results  Component Value Date   CHOL 137  09/22/2016   HDL 42 09/22/2016   LDLCALC 68 09/22/2016   TRIG 136 09/22/2016      Wt Readings from Last 3 Encounters:  04/28/17 236 lb 8 oz (107.3 kg)  03/04/17 239 lb 4 oz (108.5 kg)  01/02/17 228 lb (103.4 kg)       ASSESSMENT AND PLAN:  Mixed hyperlipidemia - Plan: EKG 12-Lead Cholesterol is at goal on the current lipid regimen. No changes to the medications were made.  Essential hypertension - Plan: EKG 12-Lead Blood pressure is well controlled on today's visit. No changes made to the medications.  Coronary artery disease involving native coronary artery of native heart without angina pectoris - Plan: EKG 12-Lead Currently with no symptoms of angina. No further workup at this time. Continue current medication regimen. Previous cardiac  catheterization results reviewed with him, nonobstructive disease   PAD (peripheral artery disease) ([place]) - Plan: EKG 12-Lead Aortic atherosclerosis, coronary disease seen on previous PET scans  Type 2 diabetes mellitus without complication, without long-term current use of insulin ([place]) - Plan: EKG 12-Lead We have encouraged continued exercise, careful diet management in an effort to lose weight.  Pulmonary hypertension Seen byDr. Haroldine Laws.  On diuretic protocol Reinforced the emphasis for weight loss, strict diet, increasing his exercise    Total encounter time more than 25 minutes  Greater than 50% was spent in counseling and coordination of care with the patient   Disposition:   F/U  12 months   No orders of the defined types were placed in this encounter.    Signed, Esmond Plants, M.D., Ph.D. 04/28/2017  [place], [place]

## 2017-04-28 ENCOUNTER — Ambulatory Visit (INDEPENDENT_AMBULATORY_CARE_PROVIDER_SITE_OTHER): Payer: Medicare Other | Admitting: Cardiovascular Disease

## 2017-04-28 ENCOUNTER — Encounter: Payer: Self-pay | Admitting: Cardiovascular Disease

## 2017-04-28 VITALS — BP 118/72 | HR 65 | Ht 69.0 in | Wt 236.5 lb

## 2017-04-28 DIAGNOSIS — E782 Mixed hyperlipidemia: Secondary | ICD-10-CM

## 2017-04-28 DIAGNOSIS — E119 Type 2 diabetes mellitus without complications: Secondary | ICD-10-CM | POA: Diagnosis not present

## 2017-04-28 DIAGNOSIS — I209 Angina pectoris, unspecified: Secondary | ICD-10-CM | POA: Diagnosis not present

## 2017-04-28 DIAGNOSIS — I272 Pulmonary hypertension, unspecified: Secondary | ICD-10-CM

## 2017-04-28 DIAGNOSIS — I25118 Atherosclerotic heart disease of native coronary artery with other forms of angina pectoris: Secondary | ICD-10-CM

## 2017-04-28 DIAGNOSIS — C8335 Diffuse large B-cell lymphoma, lymph nodes of inguinal region and lower limb: Secondary | ICD-10-CM

## 2017-04-28 DIAGNOSIS — I739 Peripheral vascular disease, unspecified: Secondary | ICD-10-CM | POA: Diagnosis not present

## 2017-04-28 DIAGNOSIS — I2510 Atherosclerotic heart disease of native coronary artery without angina pectoris: Secondary | ICD-10-CM

## 2017-04-28 DIAGNOSIS — I10 Essential (primary) hypertension: Secondary | ICD-10-CM

## 2017-04-28 NOTE — Patient Instructions (Signed)

## 2017-05-25 ENCOUNTER — Ambulatory Visit: Payer: Self-pay | Admitting: Orthopedic Surgery

## 2017-05-26 ENCOUNTER — Telehealth: Payer: Self-pay | Admitting: Cardiovascular Disease

## 2017-05-26 NOTE — Telephone Encounter (Signed)
Acceptable risk for knee surgery No further testing needed

## 2017-05-26 NOTE — Telephone Encounter (Signed)
Spoke with patient and he has not had no changes since his last visit. Received Cardiac clearance for patients upcoming right total knee replacement with [facility]. Please route clearance to fax [phone]. Will route to Dr. Rockey Situ for review.

## 2017-05-28 NOTE — Telephone Encounter (Signed)
Clearance routed to number provided.  

## 2017-10-21 ENCOUNTER — Other Ambulatory Visit: Payer: Self-pay | Admitting: Cardiovascular Disease

## 2018-03-19 ENCOUNTER — Encounter: Payer: Self-pay | Admitting: Gastroenterology

## 2018-04-09 ENCOUNTER — Ambulatory Visit (INDEPENDENT_AMBULATORY_CARE_PROVIDER_SITE_OTHER): Payer: Medicare Other | Admitting: Nurse Practitioner

## 2018-04-09 ENCOUNTER — Encounter: Payer: Self-pay | Admitting: Nurse Practitioner

## 2018-04-09 VITALS — BP 128/70 | HR 86 | Ht 68.0 in | Wt 230.8 lb

## 2018-04-09 DIAGNOSIS — K219 Gastro-esophageal reflux disease without esophagitis: Secondary | ICD-10-CM | POA: Diagnosis not present

## 2018-04-09 DIAGNOSIS — Z1211 Encounter for screening for malignant neoplasm of colon: Secondary | ICD-10-CM | POA: Diagnosis not present

## 2018-04-09 DIAGNOSIS — R1012 Left upper quadrant pain: Secondary | ICD-10-CM | POA: Diagnosis not present

## 2018-04-09 MED ORDER — PEG 3350-KCL-NA BICARB-NACL 420 G PO SOLR: 4000.0000 mL | Freq: Once | ORAL | 0 refills | Status: AC

## 2018-04-09 NOTE — Progress Notes (Addendum)
P GI Provider:  New    Chief Complaint:    Jesse Amass, MD with [place] and PLAN:    #1. 72 yo very pleasant male here with wife with self reported history of colon polyps > 12 years ago in [place], [place]. Here is referred for screening colonoscopy. No alarm features, neg [place].  -The risks and benefits of colonoscopy with possible polypectomy were discussed and the patient agrees to proceed.   #2.  Recent admission to hospital in [facility] for partial SBO. N/V resolved but he still has intermittent LUQ discomfort. He is on chronic PPI. Says cause of SBO wasn't determined, he improved and was discharged. No history of abdominal surgeries. Hx of lymphoma but that was treated and hasn't had occurrence.  -requesting records from hospital.  -Scheduling patient for EGD to be done at time of colonoscopy. The risks and benefits of EGD were discussed and the patient agrees to proceed.   Addendum 05/11/18. Records from [facility] (hospital) in [place], [place]:  -Records confirmed that patient was in fact admitted with a partial small bowel obstruction 03/16/2018.  He presented with a 24-hour history of severe diarrhea and crampy abdominal pain which started while vacationing in [place] the day prior.  His initial white count was normal at 7.7, hemoglobin elevated at 18.1 suggesting dehydration.  Initial BUN 44, creatinine 1.69.  Liver chemistries were normalCT scan of the abdomen and pelvis without contrast demonstrated diffusely dilated small loops of bowel within the upper abdomen, with a distal small bowel being decompressed.  Surgery was consulted, recommended symptomatic management.  White count quickly normalized, hemoglobin improved to 14.6 with fluids.  MCV was normal at 96.  Platelets on the lower end of normal on admission and even upon discharge when they were 128.  Renal function improved to 1.4 by time of discharge.  Etiology of partial small bowel  obstruction undetermined, may be infectious given the associated diarrhea.  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  Labs in EMR from mid April 2018 showed platelet count of 137.  Of note no abnormalities of the spleen mentioned on CT scan   #3. LUQ discomfort, intermittent. Non-radiating, unrelated to eating.   #4 Longstanding hx of GERD, > 20 years.  -Since he is getting an EGD for LUQ discomfort we can check for presence of Barrett's esophagus since he has never been screened.      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 He had gone to ED with nausea, vomiting, upper abdominal discomfort. This was his first SBO, it resolved without surgery. Unfortunately I do not have his hospital records. Patient denies history of abdominal surgery. He says that no one really knew what caused the obstruction but possibility of an infection was raised. No further nausea / vomiting but continues to have intermittent, non-radiating LUQ discomfort unrelated to eating.   Patient asked for a GI referral. His last colonoscopy was > 12 years ago (Dr. Posey Pronto) and believes that polyps were removed. He isn't having any bowel changes or blood in stool. He has occasional constipation / occasional loose stools but this isn't new. He takes daily fiber. No [place] of colon cancer. His weight has been stable.  Mr. Waterworth has a very longstanding history of GERD. He take a PPI, keeps symptoms under control. Never had an EGD.   Review of systems:   All systems reviewed and negative except where noted in HPI    Past Medical History:  Diagnosis Date  . Diabetes mellitus without complication ([place])   . Hx of lymphoma   . Hyperlipidemia   . Hypertension   . Hypotestosteronemia   . Sleep apnea    Family History    Problem Relation Age of Onset  . Heart disease Father   . Heart attack Father        x3    Creatinine clearance cannot be calculated (Patient's most recent lab result is older than the maximum 21 days allowed.)  Current Outpatient Medications  Medication Sig Dispense Refill  . Ascorbic Acid (VITAMIN C) 1000 MG tablet Take 1,000 mg by mouth daily.    Marland Kitchen aspirin 81 MG tablet Take 81 mg by mouth daily.    Marland Kitchen atorvastatin (LIPITOR) 20 MG tablet Take 20 mg by mouth at bedtime.     . B Complex-Biotin-FA (VITAMIN B50 COMPLEX PO) Take 50 mg by mouth daily.    . Calcium Carb-Cholecalciferol (CALCIUM 600 + D PO) Take 1 tablet by mouth daily.    Marland Kitchen doxazosin (CARDURA) 8 MG tablet Take 1 tablet (8 mg total) by mouth daily. 90 tablet 6  . furosemide (LASIX) 20 MG tablet Take 10 mg by mouth.    . Glucosamine HCl (GLUCOSAMINE PO) Take 30 mLs by mouth daily.    . hydrochlorothiazide (HYDRODIURIL) 25 MG tablet Take 25 mg by mouth daily.    Marland Kitchen lisinopril (PRINIVIL,ZESTRIL) 40 MG tablet Take 40 mg by mouth at bedtime.     . metFORMIN (GLUCOPHAGE) 500 MG tablet Take 500 mg by mouth 2 (two) times daily with a meal.    . metoprolol succinate (TOPROL-XL) 50 MG 24 hr tablet TAKE ONE TABLET BY MOUTH ONCE DAILY WITH  OR  FOLLOWING  A  MEAL 90 tablet 2  . metoprolol tartrate (LOPRESSOR) 50 MG tablet Take 25 mg by mouth at bedtime.    . Multiple Vitamin (MULTIVITAMIN) tablet Take 1 tablet by mouth daily.    . Omega-3 Fatty Acids (OMEGA 3 PO) Take 1,600 mg by mouth daily.     . pantoprazole (PROTONIX) 40 MG tablet Take 40 mg by mouth daily.    Marland Kitchen spironolactone (ALDACTONE) 25 MG tablet Take 25 mg by mouth at bedtime.     . Testosterone Cypionate 200 MG/ML KIT Inject 1 mL into the muscle every 14 (fourteen) days.    . vitamin E 400 UNIT capsule Take 400 Units by mouth daily.    . potassium chloride (K-DUR) 10 MEQ tablet Take 1 tablet (10 mEq total) by mouth daily. 90 tablet 3   No current facility-administered  medications for this visit.     Physical Exam:     BP 128/70   Pulse 86   Ht '5\' 8"'  (1.727 m)   Wt 230 lb 12.8 oz (104.7 kg)   SpO2 98%   BMI 35.09 kg/m   GENERAL:  Pleasant male in NAD PSYCH: : Cooperative, normal affect EENT:  conjunctiva pink, mucous membranes moist, neck supple without masses CARDIAC:  RRR,  no peripheral edema PULM: Normal respiratory effort, lungs CTA bilaterally, no wheezing ABDOMEN:  Nondistended, soft, nontender. No obvious masses, no hepatomegaly,  normal bowel sounds SKIN:  turgor, no lesions seen Musculoskeletal:  Normal muscle tone, normal  strength NEURO: Alert and oriented x 3, no focal neurologic deficits   Tye Savoy , NP 04/09/2018, 9:56 AM  Cc:  Jesse Amass, MD

## 2018-04-09 NOTE — Patient Instructions (Signed)
If you are age 73 or older, your body mass index should be between 23-30. Your Body mass index is 35.09 kg/m. If this is out of the aforementioned range listed, please consider follow up with your Primary Care Provider.  If you are age 93 or younger, your body mass index should be between 19-25. Your Body mass index is 35.09 kg/m. If this is out of the aformentioned range listed, please consider follow up with your Primary Care Provider.   You have been scheduled for an endoscopy and colonoscopy. Please follow the written instructions given to you at your visit today. Please pick up your prep supplies at the pharmacy within the next 1-3 days. If you use inhalers (even only as needed), please bring them with you on the day of your procedure. Your physician has requested that you go to www.startemmi.com and enter the access code given to you at your visit today. This web site gives a general overview about your procedure. However, you should still follow specific instructions given to you by our office regarding your preparation for the procedure.  We have sent the following medications to your pharmacy for you to pick up at your convenience: [place]  Thank you for choosing me and [facility].   Tye Savoy, NP

## 2018-04-12 ENCOUNTER — Encounter: Payer: Self-pay | Admitting: Nurse Practitioner

## 2018-04-13 NOTE — Progress Notes (Signed)
I agree with the above note, plan 

## 2018-04-26 ENCOUNTER — Telehealth: Payer: Self-pay | Admitting: Cardiovascular Disease

## 2018-04-26 DIAGNOSIS — I10 Essential (primary) hypertension: Secondary | ICD-10-CM

## 2018-04-26 DIAGNOSIS — I25118 Atherosclerotic heart disease of native coronary artery with other forms of angina pectoris: Secondary | ICD-10-CM

## 2018-04-26 DIAGNOSIS — E782 Mixed hyperlipidemia: Secondary | ICD-10-CM

## 2018-04-26 DIAGNOSIS — E119 Type 2 diabetes mellitus without complications: Secondary | ICD-10-CM

## 2018-04-26 NOTE — Telephone Encounter (Signed)
Patient calling to confirm appointment Patient states he would like to come fasting and have his lab work done if possible Please call to discuss

## 2018-04-26 NOTE — Telephone Encounter (Signed)
Dr Rockey Situ ordered Lipid, liver, BMET and Hgb A1c in January 2018. Patient would like to know if he can have these labs fasting prior to appointment to discuss with Dr Rockey Situ on 04/29/18. Routing to Dr Rockey Situ for the ok.

## 2018-04-27 NOTE — Telephone Encounter (Signed)
Would leave lab slips at desk for him to pick up, Draw in the hospital for expedited processing

## 2018-04-28 NOTE — Progress Notes (Signed)
Cardiology Office Note  Date:  04/29/2018   ID:  Tyreek Clabo, DOB 09/16/1945, MRN [id number]  PCP:  Roderic Scarce, MD   Chief Complaint  Patient presents with  . other    12 mo follow up. Medications reviewed verbally. " doing well"     HPI:  Mr. 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 He presents for follow-up of his hypertension, SOB, hyperlipidemia, Coronary disease seen on previous PET scans   In follow-up today reports everything is relatively stable Sedentary, no exercise Limited by chronic right knee pain When he goes shopping will sit down Wife does everything Currently not interested in total knee replacement  Taking Lasix and potassium as needed, sparingly   seen by Valentina Shaggy, primary care  No significant leg swelling Discuss previous cardiac catheterization results with him  Echo January 2018 with moderate pulm HTN Right heart catheterization with only minimally elevated right heart pressures  compliant with his CPAP  EKG personally reviewed by myself on todays visit Shows normal sinus rhythm rate 69 bpm right bundle branch block  Other past medical history reviewed  previous testing  shows PET scan 2013 and 2015 showing coronary calcifications Other scanning showing mild aortic atherosclerosis  Previous lab work  total cholesterol in the 130 range, LDL 74, triglycerides 177 Testosterone  300, currently receives shot every 2 weeks Normal LFTs Elevated creatinine 1.4, BUN 32   PMH:   has a past medical history of Diabetes mellitus without complication ([place]), lymphoma, Hyperlipidemia, Hypertension, Hypotestosteronemia, and Sleep apnea.  PSH:    Past Surgical History:  Procedure Laterality Date  . CARDIAC CATHETERIZATION     Dr. Sabra Heck  . RIGHT/LEFT HEART CATH AND CORONARY ANGIOGRAPHY N/A 01/02/2017   Procedure: Right/Left Heart Cath and Coronary Angiography;  Surgeon: Jolaine Artist, MD;  Location: [facility];  Service: Cardiovascular;  Laterality: N/A;    Current Outpatient Medications on File Prior to Visit  Medication Sig Dispense Refill  . Ascorbic Acid (VITAMIN C) 1000 MG tablet Take 1,000 mg by mouth daily.    Marland Kitchen aspirin 81 MG tablet Take 81 mg by mouth daily.    Marland Kitchen atorvastatin (LIPITOR) 20 MG tablet Take 20 mg by mouth at bedtime.     . B Complex-Biotin-FA (VITAMIN B50 COMPLEX PO) Take 50 mg by mouth daily.    . Calcium Carb-Cholecalciferol (CALCIUM 600 + D PO) Take 1 tablet by mouth daily.    Marland Kitchen doxazosin (CARDURA) 8 MG tablet Take 1 tablet (8 mg total) by mouth daily. 90 tablet 6  . furosemide (LASIX) 20 MG tablet Take 10 mg by mouth.    . Glucosamine HCl (GLUCOSAMINE PO) Take 30 mLs by mouth daily.    . hydrochlorothiazide (HYDRODIURIL) 25 MG tablet Take 25 mg by mouth daily.    Marland Kitchen lisinopril (PRINIVIL,ZESTRIL) 40 MG tablet Take 40 mg by mouth at bedtime.     . metFORMIN (GLUCOPHAGE) 500 MG tablet Take 500 mg by  mouth 2 (two) times daily with a meal.    . metoprolol succinate (TOPROL-XL) 50 MG 24 hr tablet TAKE ONE TABLET BY MOUTH ONCE DAILY WITH  OR  FOLLOWING  A  MEAL 90 tablet 2  . metoprolol tartrate (LOPRESSOR) 50 MG tablet Take 25 mg by mouth at bedtime.    . Multiple Vitamin (MULTIVITAMIN) tablet Take 1 tablet by mouth daily.    . Omega-3 Fatty Acids (OMEGA 3 PO) Take 1,600 mg by mouth daily.     . pantoprazole (PROTONIX) 40 MG tablet Take 40  mg by mouth daily.    . potassium chloride (K-DUR) 10 MEQ tablet Take 1 tablet (10 mEq total) by mouth daily. 90 tablet 3  . spironolactone (ALDACTONE) 25 MG tablet Take 25 mg by mouth at bedtime.     . Testosterone Cypionate 200 MG/ML KIT Inject 1 mL into the muscle every 14 (fourteen) days.    . vitamin E 400 UNIT capsule Take 400 Units by mouth daily.     No current facility-administered medications on file prior to visit.      Allergies:   Penicillins   Social History:  The patient  reports that he has never smoked. He has never used smokeless tobacco. He reports that he does not drink alcohol or use drugs.   Family History:   family history includes Heart attack in his father; Heart disease in his father.    Review of Systems: Review of Systems  Constitutional: Negative.   Respiratory: Positive for shortness of breath.   Cardiovascular: Negative.   Gastrointestinal: Negative.   Musculoskeletal: Negative.   Neurological: Negative.   Psychiatric/Behavioral: Negative.   All other systems reviewed and are negative.    PHYSICAL EXAM: VS:  BP 126/76 (BP Location: Left Arm, Patient Position: Sitting, Cuff Size: Normal)   Pulse 74   Ht _0  (1.727 m)   Wt 229 lb 8 oz (104.1 kg)   BMI 34.90 kg/m  , BMI Body mass index is 34.9 kg/m.  Constitutional:  oriented to person, place, and time. No distress.  HENT:  Head: Normocephalic and atraumatic.  Eyes:  no discharge. No scleral icterus.  Neck: Normal range of motion. Neck supple. No JVD present.  Cardiovascular: Normal rate, regular rhythm, normal heart sounds and intact distal pulses. Exam reveals no gallop and no friction rub. No edema No murmur heard. Pulmonary/Chest: Effort normal and breath sounds normal. No stridor. No respiratory distress.  no wheezes.  no rales.  no tenderness.  Abdominal: Soft.  no distension.  no tenderness.  Musculoskeletal: Normal range of motion.  no  tenderness or deformity.  Neurological:   normal muscle tone. Coordination normal. No atrophy Skin: Skin is warm and dry. No rash noted. not diaphoretic.  Psychiatric:  normal mood and affect. behavior is normal. Thought content normal.    Recent Labs: 04/29/2018: ALT 21; BUN 21; Creatinine, Ser 1.34; Potassium 4.3; Sodium 141    Lipid Panel Lab Results  Component Value Date   CHOL 121 04/29/2018   HDL 28 (L) 04/29/2018   LDLCALC 62 04/29/2018   TRIG 157 (H) 04/29/2018      Wt Readings from Last 3 Encounters:  04/29/18 229 lb 8 oz (104.1 kg)  04/09/18 230 lb 12.8 oz (104.7 kg)  04/28/17 236 lb 8 oz (107.3 kg)      ASSESSMENT AND PLAN:  Mixed hyperlipidemia - Plan: EKG 12-Lead Cholesterol is at goal on the current lipid regimen. No changes to the medications  were made. stable  Essential hypertension - Plan: EKG 12-Lead Blood pressure is well controlled on today's visit. No changes made to the medications. 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 Recommended weight loss and walking program Long discussion with patient and his wife  Pulmonary hypertension Spreviously seen by Dr. Haroldine Laws.  On diuretic protocol Reinforced the emphasis for weight loss, strict diet, increasing his exercise    Total encounter time more than 25 minutes  Greater than 50% was spent in counseling and coordination of care with the patient   Disposition:    F/U  12 months   Orders Placed This Encounter  Procedures  . EKG 12-Lead     Signed, Esmond Plants, M.D., Ph.D. 04/29/2018  [place], [place]

## 2018-04-28 NOTE — Telephone Encounter (Signed)
Patient verbalized understanding to have fasting lab work done tomorrow morning at [facility] as early as possible in hopes to have results by his time of appointment with Dr Rockey Situ around 10:40 am. Lab orders entered.

## 2018-04-29 ENCOUNTER — Other Ambulatory Visit
Admission: RE | Admit: 2018-04-29 | Discharge: 2018-04-29 | Disposition: A | Discharge status: Home / Self Care | Payer: Medicare Other | Source: Ambulatory Visit | Attending: Cardiovascular Disease | Admitting: Cardiovascular Disease

## 2018-04-29 ENCOUNTER — Encounter: Payer: Self-pay | Admitting: Cardiovascular Disease

## 2018-04-29 ENCOUNTER — Ambulatory Visit (INDEPENDENT_AMBULATORY_CARE_PROVIDER_SITE_OTHER): Payer: Medicare Other | Admitting: Cardiovascular Disease

## 2018-04-29 VITALS — BP 126/76 | HR 74 | Ht 68.0 in | Wt 229.5 lb

## 2018-04-29 DIAGNOSIS — I10 Essential (primary) hypertension: Secondary | ICD-10-CM

## 2018-04-29 DIAGNOSIS — I25118 Atherosclerotic heart disease of native coronary artery with other forms of angina pectoris: Secondary | ICD-10-CM | POA: Diagnosis present

## 2018-04-29 DIAGNOSIS — E119 Type 2 diabetes mellitus without complications: Secondary | ICD-10-CM

## 2018-04-29 DIAGNOSIS — E782 Mixed hyperlipidemia: Secondary | ICD-10-CM | POA: Diagnosis present

## 2018-04-29 DIAGNOSIS — I272 Pulmonary hypertension, unspecified: Secondary | ICD-10-CM

## 2018-04-29 DIAGNOSIS — I739 Peripheral vascular disease, unspecified: Secondary | ICD-10-CM

## 2018-04-29 DIAGNOSIS — C8335 Diffuse large B-cell lymphoma, lymph nodes of inguinal region and lower limb: Secondary | ICD-10-CM

## 2018-04-29 LAB — LIPID PANEL
Cholesterol: 121 mg/dL (ref 0–200)
HDL: 28 mg/dL — ABNORMAL LOW (ref >40)
LDL Cholesterol: 62 mg/dL (ref 0–99)
Total CHOL/HDL Ratio: 4.3 RATIO
Triglycerides: 157 mg/dL — ABNORMAL HIGH (ref <150)
VLDL: 31 mg/dL (ref 0–40)

## 2018-04-29 LAB — HEPATIC FUNCTION PANEL
ALT: 21 U/L (ref 0–44)
AST: 24 U/L (ref 15–41)
Albumin: 4.2 g/dL (ref 3.5–5.0)
Alkaline Phosphatase: 62 U/L (ref 38–126)
Bilirubin, Direct: 0.2 mg/dL (ref 0.0–0.2)
Indirect Bilirubin: 1 mg/dL — ABNORMAL HIGH (ref 0.3–0.9)
Total Bilirubin: 1.2 mg/dL (ref 0.3–1.2)
Total Protein: 7.3 g/dL (ref 6.5–8.1)

## 2018-04-29 LAB — BASIC METABOLIC PANEL
Anion gap: 9 (ref 5–15)
BUN: 21 mg/dL (ref 8–23)
CO2: 28 mmol/L (ref 22–32)
Calcium: 9.7 mg/dL (ref 8.9–10.3)
Chloride: 104 mmol/L (ref 98–111)
Creatinine, Ser: 1.34 mg/dL — ABNORMAL HIGH (ref 0.61–1.24)
GFR calc Af Amer: 59 mL/min — ABNORMAL LOW (ref >60)
GFR calc non Af Amer: 51 mL/min — ABNORMAL LOW (ref >60)
Glucose, Bld: 108 mg/dL — ABNORMAL HIGH (ref 70–99)
Potassium: 4.3 mmol/L (ref 3.5–5.1)
Sodium: 141 mmol/L (ref 135–145)

## 2018-04-29 LAB — HEMOGLOBIN A1C
Hgb A1c MFr Bld: 5.4 % (ref 4.8–5.6)
Mean Plasma Glucose: 108.28 mg/dL

## 2018-04-29 MED ORDER — HYDROCHLOROTHIAZIDE 25 MG PO TABS: 25.0000 mg | ORAL_TABLET | Freq: Every day | ORAL | 3 refills | Status: DC

## 2018-04-29 MED ORDER — ATORVASTATIN CALCIUM 20 MG PO TABS: 20.0000 mg | ORAL_TABLET | Freq: Every day | ORAL | 3 refills | Status: DC

## 2018-04-29 MED ORDER — PANTOPRAZOLE SODIUM 40 MG PO TBEC: 40.0000 mg | DELAYED_RELEASE_TABLET | Freq: Every day | ORAL | 3 refills | Status: DC

## 2018-04-29 MED ORDER — METOPROLOL SUCCINATE ER 50 MG PO TB24: 50.0000 mg | ORAL_TABLET | Freq: Every day | ORAL | 3 refills | Status: DC

## 2018-04-29 MED ORDER — DOXAZOSIN MESYLATE 8 MG PO TABS: 8.0000 mg | ORAL_TABLET | Freq: Every day | ORAL | 3 refills | Status: DC

## 2018-04-29 NOTE — Patient Instructions (Signed)
Medication Instructions:   No medication changes made  Labwork:  We will add on Free testosterone to labs already drawn  Testing/Procedures:  No further testing at this time   Follow-Up: It was a pleasure seeing you in the office today. Please call us if you have new issues that need to be addressed before your next appt.  [phone]  Your physician wants you to follow-up in: 12 months.  You will receive a reminder letter in the mail two months in advance. If you don't receive a letter, please call our office to schedule the follow-up appointment.  If you need a refill on your cardiac medications before your next appointment, please call your pharmacy.  For educational health videos Log in to : [email or web address] Or : [email or web address], password : [email or web address]

## 2018-04-30 LAB — TESTOSTERONE: Testosterone: 1029 ng/dL — ABNORMAL HIGH (ref 264–916)

## 2018-05-11 DIAGNOSIS — M1711 Unilateral primary osteoarthritis, right knee: Secondary | ICD-10-CM | POA: Insufficient documentation

## 2018-05-14 ENCOUNTER — Ambulatory Visit: Payer: Medicare Other | Admitting: Gastroenterology

## 2018-06-08 ENCOUNTER — Ambulatory Visit (AMBULATORY_SURGERY_CENTER): Payer: Medicare Other | Admitting: Gastroenterology

## 2018-06-08 ENCOUNTER — Encounter: Payer: Self-pay | Admitting: Gastroenterology

## 2018-06-08 VITALS — BP 127/84 | HR 76 | Temp 98.4°F | Resp 16 | Ht 68.0 in | Wt 230.0 lb

## 2018-06-08 DIAGNOSIS — K635 Polyp of colon: Secondary | ICD-10-CM | POA: Diagnosis not present

## 2018-06-08 DIAGNOSIS — K5730 Diverticulosis of large intestine without perforation or abscess without bleeding: Secondary | ICD-10-CM | POA: Diagnosis not present

## 2018-06-08 DIAGNOSIS — D124 Benign neoplasm of descending colon: Secondary | ICD-10-CM

## 2018-06-08 DIAGNOSIS — K219 Gastro-esophageal reflux disease without esophagitis: Secondary | ICD-10-CM | POA: Diagnosis present

## 2018-06-08 DIAGNOSIS — K2950 Unspecified chronic gastritis without bleeding: Secondary | ICD-10-CM | POA: Diagnosis not present

## 2018-06-08 DIAGNOSIS — Z1211 Encounter for screening for malignant neoplasm of colon: Secondary | ICD-10-CM | POA: Diagnosis not present

## 2018-06-08 DIAGNOSIS — R1012 Left upper quadrant pain: Secondary | ICD-10-CM

## 2018-06-08 DIAGNOSIS — D122 Benign neoplasm of ascending colon: Secondary | ICD-10-CM

## 2018-06-08 MED ORDER — SODIUM CHLORIDE 0.9 % IV SOLN: 500.0000 mL | Freq: Once | INTRAVENOUS | Status: DC

## 2018-06-08 NOTE — Progress Notes (Signed)
History reviewed today 

## 2018-06-08 NOTE — Progress Notes (Signed)
Called to room to assist during endoscopic procedure.  Patient ID and intended procedure confirmed with present staff. Received instructions for my participation in the procedure from the performing physician.  

## 2018-06-08 NOTE — Op Note (Signed)
[facility] Patient Name: [name] Procedure Date: 06/08/2018 7:58 AM MRN: [id number] Endoscopist: Milus Banister , MD Age: 72 Referring MD:  Date of Birth: [birth year] Gender: Male Account #: [id number] Procedure:                Upper GI endoscopy Indications:              Abdominal pain in the left upper quadrant, recent                            SBO in [place] of unclear etiology Medicines:                Monitored Anesthesia Care Procedure:                Pre-Anesthesia Assessment:                           - Prior to the procedure, a History and Physical                            was performed, and patient medications and                            allergies were reviewed. The patient's tolerance of                            previous anesthesia was also reviewed. The risks                            and benefits of the procedure and the sedation                            options and risks were discussed with the patient.                            All questions were answered, and informed consent                            was obtained. Prior Anticoagulants: The patient has                            taken no previous anticoagulant or antiplatelet                            agents. ASA Grade Assessment: II - A patient with                            mild systemic disease. After reviewing the risks                            and benefits, the patient was deemed in                            satisfactory condition to undergo the procedure.  After obtaining informed consent, the endoscope was                            passed under direct vision. Throughout the                            procedure, the patient's blood pressure, pulse, and                            oxygen saturations were monitored continuously. The                            Endoscope was introduced through the mouth, and                            advanced to the second  part of duodenum. The upper                            GI endoscopy was accomplished without difficulty.                            The patient tolerated the procedure well. Scope In: Scope Out: Findings:                 Mild inflammation characterized by erythema and                            granularity was found in the gastric antrum.                            Biopsies were taken with a cold forceps for                            histology.                           The exam was otherwise without abnormality. Complications:            No immediate complications. Estimated blood loss:                            None. Estimated Blood Loss:     Estimated blood loss: none. Impression:               - Gastritis. Biopsied.                           - These findings MAY explain some of your left                            upper quadrant pain but do not shed light on your                            recent [place] SBO. Recommendation:           - Patient has a contact number available for  emergencies. The signs and symptoms of potential                            delayed complications were discussed with the                            patient. Return to normal activities tomorrow.                            Written discharge instructions were provided to the                            patient.                           - Resume previous diet.                           - Continue present medications.                           - Await pathology results.                           - Dr. Ardis Hughs' office will arrange UGI with SBFT to                            check for potential anatomic issues that could have                            caused your SBO recently. Milus Banister, MD 06/08/2018 8:40:37 AM This report has been signed electronically.

## 2018-06-08 NOTE — Op Note (Signed)
[facility] Patient Name: [name] Procedure Date: 06/08/2018 7:58 AM MRN: [id number] Endoscopist: Milus Banister , MD Age: 72 Referring MD:  Date of Birth: July 30, 1946 Gender: Male Account #: [id number] Procedure:                Colonoscopy Indications:              Screening for colorectal malignant neoplasm Medicines:                Monitored Anesthesia Care Procedure:                Pre-Anesthesia Assessment:                           - Prior to the procedure, a History and Physical                            was performed, and patient medications and                            allergies were reviewed. The patient's tolerance of                            previous anesthesia was also reviewed. The risks                            and benefits of the procedure and the sedation                            options and risks were discussed with the patient.                            All questions were answered, and informed consent                            was obtained. Prior Anticoagulants: The patient has                            taken no previous anticoagulant or antiplatelet                            agents. ASA Grade Assessment: II - A patient with                            mild systemic disease. After reviewing the risks                            and benefits, the patient was deemed in                            satisfactory condition to undergo the procedure.                           After obtaining informed consent, the colonoscope  was passed under direct vision. Throughout the                            procedure, the patient's blood pressure, pulse, and                            oxygen saturations were monitored continuously. The                            Colonoscope was introduced through the anus and                            advanced to the the cecum, identified by                            appendiceal orifice and  ileocecal valve. The                            colonoscopy was performed without difficulty. The                            patient tolerated the procedure well. The quality                            of the bowel preparation was good. The ileocecal                            valve, appendiceal orifice, and rectum were                            photographed. Scope In: 8:13:46 AM Scope Out: 8:26:10 AM Scope Withdrawal Time: 0 hours 8 minutes 34 seconds  Total Procedure Duration: 0 hours 12 minutes 24 seconds  Findings:                 Two sessile polyps were found in the descending                            colon and ascending colon. The polyps were 2 to 3                            mm in size. These polyps were removed with a cold                            snare. Resection and retrieval were complete.                           Multiple small and large-mouthed diverticula were                            found in the left colon.                           The exam was otherwise without abnormality on  direct and retroflexion views. Complications:            No immediate complications. Estimated blood loss:                            None. Estimated Blood Loss:     Estimated blood loss: none. Impression:               - Two 2 to 3 mm polyps in the descending colon and                            in the ascending colon, removed with a cold snare.                            Resected and retrieved.                           - Diverticulosis in the left colon.                           - The examination was otherwise normal on direct                            and retroflexion views. Recommendation:           - Patient has a contact number available for                            emergencies. The signs and symptoms of potential                            delayed complications were discussed with the                            patient. Return to normal activities  tomorrow.                            Written discharge instructions were provided to the                            patient.                           - Resume previous diet.                           - Continue present medications.                           You will receive a letter within 2-3 weeks with the                            pathology results and my final recommendations.                           If the polyp(s) is proven to be 'pre-cancerous' on  pathology, you will need repeat colonoscopy in 5                            years. If the polyp(s) is NOT 'precancerous' on                            pathology then you should repeat colon cancer                            screening in 10 years with colonoscopy without need                            for colon cancer screening by any method prior to                            then (including stool testing). Milus Banister, MD 06/08/2018 8:34:52 AM This report has been signed electronically.

## 2018-06-08 NOTE — Progress Notes (Signed)
Report given to PACU, vss 

## 2018-06-08 NOTE — Patient Instructions (Signed)
Thank you for allowing [place] to care for you today!  Await pathology results by mail, 2-3 weeks.  Dr Ardis Hughs office will contact you regarding appointment for Upper GI with Small Bowel Follow Through (SBFT) to check for potential anatomic issues that may have contributed to your small bowel obstruction.  Resume previous diet and medications.  Return to normal activities tomorrow.  Handouts provided for polyps and gastritis.    YOU HAD AN ENDOSCOPIC PROCEDURE TODAY AT [facility]:   Refer to the procedure report that was given to you for any specific questions about what was found during the examination.  If the procedure report does not answer your questions, please call your gastroenterologist to clarify.  If you requested that your care partner not be given the details of your procedure findings, then the procedure report has been included in a sealed envelope for you to review at your convenience later.  YOU SHOULD EXPECT: Some feelings of bloating in the abdomen. Passage of more gas than usual.  Walking can help get rid of the air that was put into your GI tract during the procedure and reduce the bloating. If you had a lower endoscopy (such as a colonoscopy or flexible sigmoidoscopy) you may notice spotting of blood in your stool or on the toilet paper. If you underwent a bowel prep for your procedure, you may not have a normal bowel movement for a few days.  Please Note:  You might notice some irritation and congestion in your nose or some drainage.  This is from the oxygen used during your procedure.  There is no need for concern and it should clear up in a day or so.  SYMPTOMS TO REPORT IMMEDIATELY:   Following lower endoscopy (colonoscopy or flexible sigmoidoscopy):  Excessive amounts of blood in the stool  Significant tenderness or worsening of abdominal pains  Swelling of the abdomen that is new, acute  Fever of 100F or higher   Following upper endoscopy  (EGD)  Vomiting of blood or coffee ground material  New chest pain or pain under the shoulder blades  Painful or persistently difficult swallowing  New shortness of breath  Fever of 100F or higher  Black, tarry-looking stools  For urgent or emergent issues, a gastroenterologist can be reached at any hour by calling [phone].   DIET:  We do recommend a small meal at first, but then you may proceed to your regular diet.  Drink plenty of fluids but you should avoid alcoholic beverages for 24 hours.  ACTIVITY:  You should plan to take it easy for the rest of today and you should NOT DRIVE or use heavy machinery until tomorrow (because of the sedation medicines used during the test).    FOLLOW UP: Our staff will call the number listed on your records the next business day following your procedure to check on you and address any questions or concerns that you may have regarding the information given to you following your procedure. If we do not reach you, we will leave a message.  However, if you are feeling well and you are not experiencing any problems, there is no need to return our call.  We will assume that you have returned to your regular daily activities without incident.  If any biopsies were taken you will be contacted by phone or by letter within the next 1-3 weeks.  Please call us at [phone] if you have not heard about the biopsies in 3  weeks.    SIGNATURES/CONFIDENTIALITY: You and/or your care partner have signed paperwork which will be entered into your electronic medical record.  These signatures attest to the fact that that the information above on your After Visit Summary has been reviewed and is understood.  Full responsibility of the confidentiality of this discharge information lies with you and/or your care-partner.

## 2018-06-09 ENCOUNTER — Telehealth: Payer: Self-pay

## 2018-06-09 DIAGNOSIS — K56609 Unspecified intestinal obstruction, unspecified as to partial versus complete obstruction: Secondary | ICD-10-CM

## 2018-06-09 NOTE — Telephone Encounter (Signed)
Per the procedure report from 06/08/18 Dr Ardis Hughs would like the pt to have UGI with SBFT to check for potential anatomic issues that may have caused SBO recently.

## 2018-06-09 NOTE — Telephone Encounter (Signed)
Left message on machine to call back  

## 2018-06-09 NOTE — Telephone Encounter (Signed)
  Follow up Call-  Call back number 06/08/2018  Post procedure Call Back phone  # [phone]  Permission to leave phone message Yes     Patient questions:  Do you have a fever, pain , or abdominal swelling? No. Pain Score  0 *  Have you tolerated food without any problems? Yes.    Have you been able to return to your normal activities? Yes.    Do you have any questions about your discharge instructions: Diet   No. Medications  No. Follow up visit  No.  Do you have questions or concerns about your Care? No.  Actions: * If pain score is 4 or above: No action needed, pain <4.

## 2018-06-09 NOTE — Telephone Encounter (Signed)
You have been scheduled for an Upper GI Series at [facility]. Your appointment is on 06/28/18 at 930 am. Please arrive 15 minutes prior to your test for registration. Make sure not to eat or drink anything after midnight on the night before your test. If you need to reschedule, please call radiology at [phone]. ________________________________________________________ An upper GI series uses x rays to help diagnose problems of the upper GI tract, which includes the esophagus, stomach, and duodenum. The duodenum is the first part of the small intestine. An upper GI series is conducted by a radiology technologist or a radiologist-a doctor who specializes in x-ray imaging-at a hospital or outpatient center. While sitting or standing in front of an x-ray machine, the patient drinks barium liquid, which is often white and has a chalky consistency and taste. The barium liquid coats the lining of the upper GI tract and makes signs of disease show up more clearly on x rays. X-ray video, called fluoroscopy, is used to view the barium liquid moving through the esophagus, stomach, and duodenum. Additional x rays and fluoroscopy are performed while the patient lies on an x-ray table. To fully coat the upper GI tract with barium liquid, the technologist or radiologist may press on the abdomen or ask the patient to change position. Patients hold still in various positions, allowing the technologist or radiologist to take x rays of the upper GI tract at different angles. If a technologist conducts the upper GI series, a radiologist will later examine the images to look for problems.  This test typically takes about 1 hour to complete. ________________________________________________________  The patient has been notified of this information and all questions answered. The pt has been advised of the information and verbalized understanding.

## 2018-06-28 ENCOUNTER — Other Ambulatory Visit: Payer: Self-pay | Admitting: Gastroenterology

## 2018-06-28 ENCOUNTER — Ambulatory Visit (HOSPITAL_COMMUNITY)
Admission: RE | Admit: 2018-06-28 | Discharge: 2018-06-28 | Disposition: A | Discharge status: Home / Self Care | Payer: Medicare Other | Source: Ambulatory Visit | Attending: Gastroenterology | Admitting: Gastroenterology

## 2018-06-28 DIAGNOSIS — K219 Gastro-esophageal reflux disease without esophagitis: Secondary | ICD-10-CM | POA: Diagnosis not present

## 2018-06-28 DIAGNOSIS — K56609 Unspecified intestinal obstruction, unspecified as to partial versus complete obstruction: Secondary | ICD-10-CM | POA: Insufficient documentation

## 2018-06-28 DIAGNOSIS — M769 Unspecified enthesopathy, lower limb, excluding foot: Secondary | ICD-10-CM | POA: Insufficient documentation

## 2018-06-30 ENCOUNTER — Telehealth: Payer: Self-pay | Admitting: *Deleted

## 2018-06-30 NOTE — Telephone Encounter (Signed)
Call placed to [facility]   Pathology. Spoke with Dr Thressa Sheller, pathologist who will relook at path specimen and generate a new result.

## 2018-07-01 ENCOUNTER — Encounter: Payer: Self-pay | Admitting: Gastroenterology

## 2018-07-20 ENCOUNTER — Telehealth: Payer: Self-pay

## 2018-07-20 MED ORDER — LISINOPRIL 40 MG PO TABS: 40.0000 mg | ORAL_TABLET | Freq: Every day | ORAL | 1 refills | Status: DC

## 2018-07-20 NOTE — Telephone Encounter (Signed)
RX for Lisiniopril sent to [facility].

## 2018-08-09 ENCOUNTER — Telehealth: Payer: Self-pay

## 2018-08-09 NOTE — Telephone Encounter (Signed)
Please review patient's chart for a refill request on Metoprolol Tartrate 25 mg take one tablet twice a day.  The patient has two different doses on the medication list from the patient's last office visit.  It's unclear which medication the Metoprolol succ or tartrate and dose should the patient be taking.

## 2018-08-10 NOTE — Telephone Encounter (Signed)
No answer. Left message to call back to clarify what metoprolol medication he has been taking.

## 2018-08-11 MED ORDER — METOPROLOL TARTRATE 25 MG PO TABS: ORAL_TABLET | ORAL | 2 refills | Status: DC

## 2018-08-11 NOTE — Telephone Encounter (Signed)
Pt is returning your call. Metoprolol 25 mg is the medication he is taking.  Pt states his pharmacy is [facility] in [place], [place]

## 2018-08-11 NOTE — Telephone Encounter (Signed)
I called and spoke with [facility].  They confirm the patient was dispensed:  1) metoprolol succ- 50 mg - take 1 tablet by mouth once daily (07/26/18- last fill date) 2) metoprolol tartrate 25 mg- take 1 tablet by mouth BID (01/19/18 last fill date)  I called and was able to speak with the patient. He confirms he is taking: 1) metoprolol succinate 50 mg- 1 tablet every morning 2) metoprolol tartrate 25 mg- 1 tablet every night  He states he has reviewed this with Dr. Rockey Situ at his last office visit and this was ok'ed per Dr. Rockey Situ. The patient was having trouble with his HR's going up at night.  The patient currently has metoprolol succinate, but does need the metoprolol tartrate 25 mg tablets filled. I advised I will send this in to [facility] for him. He was appreciative for the call back.

## 2019-02-04 ENCOUNTER — Other Ambulatory Visit: Payer: Self-pay | Admitting: Cardiovascular Disease

## 2019-05-23 ENCOUNTER — Other Ambulatory Visit: Payer: Self-pay | Admitting: Cardiovascular Disease

## 2019-05-24 NOTE — Telephone Encounter (Signed)
Please schedule overdue 12 month F/U with Dr. Gollan. Thank you! 

## 2019-05-31 NOTE — Progress Notes (Signed)
Virtual Visit via Video Note   This visit type was conducted due to national recommendations for restrictions regarding the COVID-19 Pandemic (e.g. social distancing) in an effort to limit this patient's exposure and mitigate transmission in our community.  Due to his co-morbid illnesses, this patient is at least at moderate risk for complications without adequate follow up.  This format is felt to be most appropriate for this patient at this time.  All issues noted in this document were discussed and addressed.  A limited physical exam was performed with this format.  Please refer to the patient's chart for his consent to telehealth for [facility].   I connected with  [name] on 06/01/19 by a video enabled telemedicine application and verified that I am speaking with the correct person using two identifiers. I discussed the limitations of evaluation and management by telemedicine. The patient expressed understanding and agreed to proceed.   Evaluation Performed:  Follow-up visit  Date:  06/01/2019   ID:  [name], DOB December 28, 1945, MRN [id number]  Patient Location:  [address] [place] [address]   Provider location:   [facility], [place] office  PCP:  Roderic Scarce, MD  Cardiologist:  Alaya Iverson, [place]   Chief Complaint:  Weight loss   History of Present Illness:    [name] is a 73 y.o. male who presents via audio/video conferencing for a telehealth visit today.   The patient does not symptoms concerning for COVID-19 infection (fever, chills, cough, or new SHORTNESS OF BREATH).   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 Echo January 2018 with moderate pulm HTN Right heart catheterization with only minimally elevated right heart pressures compliant with his CPAP He presents for follow-up of hishypertension, SOB, hyperlipidemia, Coronary disease seen on previous PET scans   Going to restaurants Has labs scheduled later this week  Sedentary, no exercise Limited by chronic right knee pain Currently not interested in total knee replacement Cortisone shot to knee  Taking Lasix and potassium as needed, sparingly  No significant leg swelling Weight loss  Other past medical history reviewed previoustesting shows PET scan 2013 and 2015 showing coronary calcifications Other scanning showing mild aortic atherosclerosis  Previous lab work total cholesterol in the 130 range, LDL 74, triglycerides 177 Testosterone 300, currently receives shot every 2 weeks Normal LFTs Elevated creatinine 1.4, BUN 32   Prior CV studies:   The following studies were reviewed today:   Past Medical History:  Diagnosis Date  . Cancer ([place]) 2011   lymphoma L leg  . Diabetes mellitus without complication ([place])   . GERD (gastroesophageal reflux disease)   . Hx of lymphoma   . Hyperlipidemia   . Hypertension   . Hypotestosteronemia   . Sleep apnea 2013   wears CPAP   Past Surgical History:  Procedure Laterality Date  . CARDIAC CATHETERIZATION     Dr. Sabra Heck  . RIGHT/LEFT HEART CATH AND CORONARY ANGIOGRAPHY N/A  01/02/2017   Procedure: Right/Left Heart Cath and Coronary Angiography;  Surgeon: Jolaine Artist, MD;  Location: [facility];  Service: Cardiovascular;  Laterality: N/A;     Allergies:   Penicillins   Social History   Tobacco Use  . Smoking status: Never Smoker  .  Smokeless tobacco: Never Used  Substance Use Topics  . Alcohol use: No  . Drug use: No     Current Outpatient Medications on File Prior to Visit  Medication Sig Dispense Refill  . Ascorbic Acid (VITAMIN C) 1000 MG tablet Take 1,000 mg by mouth daily.    Marland Kitchen atorvastatin (LIPITOR) 20 MG tablet Take 1 tablet (20 mg total) by mouth at bedtime. 90 tablet 3  . B Complex-Biotin-FA (VITAMIN B50 COMPLEX PO) Take 50 mg by mouth daily.    . Calcium Carb-Cholecalciferol (CALCIUM 600 + D PO) Take 1 tablet by mouth daily.    Marland Kitchen doxazosin (CARDURA) 8 MG tablet Take 1 tablet by mouth once daily 90 tablet 0  . furosemide (LASIX) 20 MG tablet Take 20 mg by mouth daily as needed.     . hydrochlorothiazide (HYDRODIURIL) 25 MG tablet Take 1 tablet (25 mg total) by mouth daily. 90 tablet 3  . ipratropium-albuterol (DUONEB) 0.5-2.5 (3) MG/3ML SOLN ipratropium 0.5 mg-albuterol 3 mg (2.5 mg base)/3 mL nebulization soln  USE 1 AMPULE IN NEBULIZER 4 TIMES DAILY AS NEEDED FOR 5 DAYS    . lisinopril (PRINIVIL,ZESTRIL) 40 MG tablet Take 1 tablet (40 mg total) by mouth at bedtime. 90 tablet 1  . metFORMIN (GLUCOPHAGE) 500 MG tablet Take 500 mg by mouth 2 (two) times daily with a meal.    . metoprolol succinate (TOPROL-XL) 50 MG 24 hr tablet Take 1 tablet (50 mg) by mouth once daily in the morning. Take with or immediately following a meal.    . metoprolol tartrate (LOPRESSOR) 25 MG tablet Take 1 tablet (25 mg) by mouth once daily at night 90 tablet 2  . mirabegron ER (MYRBETRIQ) 50 MG TB24 tablet Take 50 mg by mouth daily.    . Multiple Vitamin (MULTIVITAMIN) tablet Take 1 tablet by mouth daily.    . Omega-3 Fatty Acids (OMEGA 3 PO) Take 1,600 mg by mouth daily.     . OXYGEN Inhale 2.5 L into the lungs daily.    . pantoprazole (PROTONIX) 40 MG tablet Take 1 tablet (40 mg total) by mouth daily. 90 tablet 3  . potassium chloride (K-DUR) 10 MEQ tablet Take 1 tablet (10 mEq total) by mouth daily. 90 tablet 3  . spironolactone  (ALDACTONE) 25 MG tablet Take 25 mg by mouth at bedtime.     . Testosterone Cypionate 200 MG/ML KIT Inject 1 mL into the muscle every 14 (fourteen) days.    . vitamin E 400 UNIT capsule Take 400 Units by mouth daily.     No current facility-administered medications on file prior to visit.      Family Hx: The patient's family history includes Heart attack in his father; Heart disease in his father. There is no history of Colon cancer, Rectal cancer, Stomach cancer, or Esophageal cancer.  ROS:   Please see the history of present illness.    Review of Systems  Constitutional: Negative.   HENT: Negative.   Respiratory: Negative.   Cardiovascular: Negative.   Gastrointestinal: Negative.   Musculoskeletal: Positive for joint pain.  Neurological: Negative.   Psychiatric/Behavioral: Negative.   All other systems reviewed and are negative.  Labs/Other Tests and Data Reviewed:    Recent Labs: No results found for requested labs within last 8760 hours.   Recent Lipid Panel Lab Results  Component Value Date/Time   CHOL 121 04/29/2018 10:12 AM   CHOL 137 09/22/2016 09:13 AM   TRIG 157 (H) 04/29/2018 10:12 AM   HDL 28 (L) 04/29/2018 10:12 AM   HDL 42 09/22/2016 09:13 AM   CHOLHDL 4.3 04/29/2018 10:12 AM   LDLCALC 62 04/29/2018 10:12 AM   LDLCALC 68 09/22/2016 09:13 AM    Wt Readings from Last 3 Encounters:  06/01/19 221 lb (100.2 kg)  06/08/18 230 lb (104.3 kg)  04/29/18 229 lb 8 oz (104.1 kg)     Exam:    Vital Signs: Vital signs may also be detailed in the HPI BP 118/76   Pulse 82   Ht 5' 8.5" (1.74 m)   Wt 221 lb (100.2 kg)   BMI 33.11 kg/m   Wt Readings from Last 3 Encounters:  06/01/19 221 lb (100.2 kg)  06/08/18 230 lb (104.3 kg)  04/29/18 229 lb 8 oz (104.1 kg)   Temp Readings from Last 3 Encounters:  06/08/18 98.4 F (36.9 C) (Temporal)  01/02/17 97.7 F (36.5 C)   BP Readings from Last 3 Encounters:  06/01/19 118/76  06/08/18 127/84  04/29/18 126/76    Pulse Readings from Last 3 Encounters:  06/01/19 82  06/08/18 76  04/29/18 74     Well nourished, well developed male in no acute distress. Constitutional:  oriented to person, place, and time. No distress.    ASSESSMENT & PLAN:    Problem List Items Addressed This Visit      Cardiology Problems   Pulmonary HTN ([place])   Hyperlipidemia   Essential hypertension   PAD (peripheral artery disease) (HCC)   CAD (coronary artery disease) - Primary     Other   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 (HCC)   Morbid obesity ([place])   Type 2 diabetes mellitus without complication, without long-term current use of insulin (HCC)     Mixed hyperlipidemia - Plan: EKG 12-Lead Cholesterol is at goal on the current lipid regimen. No changes to the medications were made. stable  Essential hypertension - Plan: EKG 12-Lead Blood pressure is well controlled on today's visit. No changes made to the medications. stable  Coronary artery disease involving native coronary artery of native heart without angina pectoris - Plan: EKG 12-Lead  Previous cardiac catheterization,nonobstructive disease No further testing ordered Denies having any anginal symptoms   PAD (peripheral artery disease) ([place]) - Plan: EKG 12-Lead Aortic atherosclerosis, coronary disease seen on previous PET scans Cholesterol at goal, lipids at goal No further screening studies needed  Type 2 diabetes mellitus without complication, without long-term current use of insulin (HCC) -  Recommended weight loss and walking program HAB1C 5.4  Pulmonary hypertension Denies SOB, no edema Normal RH pressures in 2018 Will stay on lasix 3x a week   COVID-19 Education: The signs and symptoms of COVID-19 were discussed with the patient and how to seek care for testing (follow up with PCP or arrange E-visit).  The importance of social distancing was discussed today.  Patient Risk:   After full review of  this patients clinical status, I feel that they are at least moderate risk at this time.  Time:   Today, I have spent 25 minutes with the patient with telehealth technology discussing the cardiac and medical problems/diagnoses detailed above  Additional 10 min spent reviewing the chart prior to patient visit today   Medication Adjustments/Labs and Tests Ordered: Current medicines are reviewed at length with the patient today.  Concerns regarding medicines are outlined above.   Tests Ordered: No tests ordered   Medication Changes: No changes made   Disposition: Follow-up in 12 months   Signed, Ida Rogue, MD  [place] Office [address] [place] [address], [place], [place] [address]

## 2019-06-01 ENCOUNTER — Telehealth (INDEPENDENT_AMBULATORY_CARE_PROVIDER_SITE_OTHER): Payer: Medicare Other | Admitting: Cardiovascular Disease

## 2019-06-01 ENCOUNTER — Encounter: Payer: Self-pay | Admitting: Cardiovascular Disease

## 2019-06-01 VITALS — BP 118/76 | HR 82 | Ht 68.5 in | Wt 221.0 lb

## 2019-06-01 DIAGNOSIS — I10 Essential (primary) hypertension: Secondary | ICD-10-CM | POA: Diagnosis not present

## 2019-06-01 DIAGNOSIS — C8335 Diffuse large B-cell lymphoma, lymph nodes of inguinal region and lower limb: Secondary | ICD-10-CM

## 2019-06-01 DIAGNOSIS — I739 Peripheral vascular disease, unspecified: Secondary | ICD-10-CM

## 2019-06-01 DIAGNOSIS — I25118 Atherosclerotic heart disease of native coronary artery with other forms of angina pectoris: Secondary | ICD-10-CM | POA: Diagnosis not present

## 2019-06-01 DIAGNOSIS — E119 Type 2 diabetes mellitus without complications: Secondary | ICD-10-CM

## 2019-06-01 DIAGNOSIS — E782 Mixed hyperlipidemia: Secondary | ICD-10-CM

## 2019-06-01 DIAGNOSIS — I272 Pulmonary hypertension, unspecified: Secondary | ICD-10-CM

## 2019-06-01 NOTE — Patient Instructions (Signed)

## 2019-07-26 ENCOUNTER — Other Ambulatory Visit: Payer: Medicare Other

## 2019-07-26 ENCOUNTER — Encounter: Payer: Self-pay | Admitting: Nurse Practitioner

## 2019-07-26 ENCOUNTER — Ambulatory Visit (INDEPENDENT_AMBULATORY_CARE_PROVIDER_SITE_OTHER): Payer: Medicare Other | Admitting: Nurse Practitioner

## 2019-07-26 ENCOUNTER — Other Ambulatory Visit: Payer: Self-pay

## 2019-07-26 VITALS — BP 134/70 | HR 91 | Temp 97.9°F | Ht 68.5 in | Wt 230.0 lb

## 2019-07-26 DIAGNOSIS — R195 Other fecal abnormalities: Secondary | ICD-10-CM | POA: Diagnosis not present

## 2019-07-26 DIAGNOSIS — I25118 Atherosclerotic heart disease of native coronary artery with other forms of angina pectoris: Secondary | ICD-10-CM

## 2019-07-26 NOTE — Progress Notes (Signed)
Chief Complaint:    Loose stool  IMPRESSION and PLAN:    63.  73 year old male with hx of SBO (July 2019 in [place], [place]) here with several month history of frequent loose stool. Having some solid BMs too.  Of note, he had an EGD / colonoscopy with [place] in October 2019 for evaluation of GERD and hx of colon polyps. Following that he had an UGI with SBFT to evaluate anatomy given history of SBO  Though study not totally complete, it was unrevealing.  No evidence for obstructive process on exam today. Etiology of frequent loose stools not yet clear.   --check stool for C-diff, lactoferrin, fecal elastase ( diabetic) --In the interim patient will call PCP about a 2 week drug holiday from Glucophage to see if it makes any difference --Will call him with stool study results.  At that time we will get a condition update, hopefully with him off Glucophage.  If stopping Glucophage does not make a difference and stool studies are negative then will start Lomotil while awaiting further workup .   2. History of colon adenomas , due for surveillance colonoscopy Oct 2024.   HPI:     Patient is a 73 year old with a history of chronic thrombocytopenia, SBO, lymphoma of leg, diabetes, CAD hyperlipidemia, hypertension, sleep apnea on CPAP , SBO, adenomatous colon polyps.  Patient established care with [place] August 2019 for screening colonoscopy but also LUQ pain.  He had been in the hospital in [place] with a small bowel obstruction.  I requested and received records from [facility] in [place].  Patient was hospitalized for 24 hours with crampy diarrhea and abdominal pain.  He had AKI.  CT scan of the abdomen and pelvis without contrast showed diffusely dilated small bowel loops in the upper abdomen with distal small bowel being decompressed.  Surgery was consulted, patient managed conservatively.  [name] comes in for evaluation of loose stool. He has taken fiber supplements for years.  Several months ago he began having frequent loose BMs. He does have some normal BMs as well. Stopped fiber but didn't make any difference. Over the last 2-3 months loose stools have become even more frequent. Sometimes has 3-4 loose stools a day, sometimes with incontinence. Had antibiotics for dental purposes October but loose stool was already occurring. He has been on Glucophage for 7 years. Hasn't tried any anti-diarrheals yet..   Data Reviewed:   EGD 06/08/2018 -Gastritis. Biopsied. -These findings MAY explain some of your left upper quadrant pain but do not shed light on your recent [place] SBO  Colonoscopy 06/08/2018 Two 2 to 3 mm polyps in the descending colon and in the ascending colon, removed with a cold snare. Resected and retrieved. - Diverticulosis in the left colon. - The examination was otherwise normal on direct and retroflexion views.  Polyp pathology >>>> adenomas.  Recall colonoscopy in 5 years  06/29/19 UGI w/ SBFT >>>> no explanation for SBO though exam cut short as patient had to leave.   Review of systems:     No chest pain, no SOB, no fevers, no urinary sx   Past Medical History:  Diagnosis Date  . Cancer ([place]) 2011   lymphoma L leg  . Diabetes mellitus without complication ([place])   . GERD (gastroesophageal reflux disease)   . Hx of lymphoma   . Hyperlipidemia   . Hypertension   . Hypotestosteronemia   . Sleep apnea 2013   wears CPAP  Patient's surgical history, family medical history, social history, medications and allergies were all reviewed in Epic    Current Outpatient Medications  Medication Sig Dispense Refill  . Ascorbic Acid (VITAMIN C) 1000 MG tablet Take 1,000 mg by mouth daily.    Marland Kitchen atorvastatin (LIPITOR) 20 MG tablet Take 1 tablet (20 mg total) by mouth at bedtime. 90 tablet 3  . B Complex-Biotin-FA (VITAMIN B50 COMPLEX PO) Take 50 mg by mouth daily.    . Calcium Carb-Cholecalciferol (CALCIUM 600 + D PO) Take 1 tablet by mouth daily.     Marland Kitchen doxazosin (CARDURA) 8 MG tablet Take 1 tablet by mouth once daily 90 tablet 0  . furosemide (LASIX) 20 MG tablet Take 20 mg by mouth daily as needed.     . hydrochlorothiazide (HYDRODIURIL) 25 MG tablet Take 1 tablet (25 mg total) by mouth daily. 90 tablet 3  . ipratropium-albuterol (DUONEB) 0.5-2.5 (3) MG/3ML SOLN ipratropium 0.5 mg-albuterol 3 mg (2.5 mg base)/3 mL nebulization soln  USE 1 AMPULE IN NEBULIZER 4 TIMES DAILY AS NEEDED FOR 5 DAYS    . lisinopril (PRINIVIL,ZESTRIL) 40 MG tablet Take 1 tablet (40 mg total) by mouth at bedtime. 90 tablet 1  . metFORMIN (GLUCOPHAGE) 500 MG tablet Take 500 mg by mouth 2 (two) times daily with a meal.    . metoprolol succinate (TOPROL-XL) 50 MG 24 hr tablet Take 1 tablet (50 mg) by mouth once daily in the morning. Take with or immediately following a meal.    . metoprolol tartrate (LOPRESSOR) 25 MG tablet TAKE 1 TABLET BY MOUTH ONCE DAILY AT NIGHT 90 tablet 3  . mirabegron ER (MYRBETRIQ) 50 MG TB24 tablet Take 50 mg by mouth daily.    . Multiple Vitamin (MULTIVITAMIN) tablet Take 1 tablet by mouth daily.    . Omega-3 Fatty Acids (OMEGA 3 PO) Take 1,600 mg by mouth daily.     . OXYGEN Inhale 2.5 L into the lungs daily.    . pantoprazole (PROTONIX) 40 MG tablet Take 1 tablet (40 mg total) by mouth daily. 90 tablet 3  . spironolactone (ALDACTONE) 25 MG tablet Take 25 mg by mouth at bedtime.     . Testosterone Cypionate 200 MG/ML KIT Inject 1 mL into the muscle every 14 (fourteen) days.    . vitamin E 400 UNIT capsule Take 400 Units by mouth daily.    . potassium chloride (K-DUR) 10 MEQ tablet Take 1 tablet (10 mEq total) by mouth daily. 90 tablet 3   No current facility-administered medications for this visit.     Physical Exam:     Temp 97.9 F (36.6 C)   Ht 5' 8.5" (1.74 m)   Wt 230 lb (104.3 kg)   BMI 34.46 kg/m   GENERAL:  Pleasant male in NAD PSYCH: : Cooperative, normal affect EENT:  conjunctiva pink, mucous membranes moist, neck  supple without masses CARDIAC:  RRR, no peripheral edema PULM: Normal respiratory effort, lungs CTA bilaterally, no wheezing ABDOMEN:  Protuberant,  soft, nontender. No obvious masses, no hepatomegaly,  normal bowel sounds SKIN:  turgor, no lesions seen Musculoskeletal:  Normal muscle tone, normal strength NEURO: Alert and oriented x 3, no focal neurologic deficits   [name] , NP 07/26/2019, 2:35 PM

## 2019-07-26 NOTE — Patient Instructions (Signed)
If you are age 74 or older, your body mass index should be between 23-30. Your Body mass index is 34.46 kg/m. If this is out of the aforementioned range listed, please consider follow up with your Primary Care Provider.  If you are age 48 or younger, your body mass index should be between 19-25. Your Body mass index is 34.46 kg/m. If this is out of the aformentioned range listed, please consider follow up with your Primary Care Provider.   Your provider has requested that you go to the basement level for lab work before leaving today. Press "B" on the elevator. The lab is located at the first door on the left as you exit the elevator.   We will call you with results.  Thank you for choosing me and [facility].   Tye Savoy, NP

## 2019-07-29 NOTE — Progress Notes (Signed)
I agree with the above note, plan 

## 2019-08-03 ENCOUNTER — Other Ambulatory Visit: Payer: Self-pay | Admitting: Cardiovascular Disease

## 2019-08-09 ENCOUNTER — Other Ambulatory Visit: Payer: Medicare Other

## 2019-08-09 DIAGNOSIS — R195 Other fecal abnormalities: Secondary | ICD-10-CM

## 2019-08-09 LAB — C.DIFFICILE TOXIN: C. Difficile Toxin A: NOT DETECTED

## 2019-08-10 LAB — FECAL LACTOFERRIN, QUANT
Fecal Lactoferrin: NEGATIVE
MICRO NUMBER:: 1150970
SPECIMEN QUALITY:: ADEQUATE

## 2019-08-12 ENCOUNTER — Telehealth: Payer: Self-pay | Admitting: Nurse Practitioner

## 2019-08-12 NOTE — Telephone Encounter (Signed)
Patient is inquiring about the stool studies. Tests are negative. Patient states his symptoms are unchanged. He will have 3 days of no bowel movement, then he will have a bowel movement which may be loose. Today his bowel movement was normal.  He did not stop the Metformin.

## 2019-08-15 NOTE — Telephone Encounter (Signed)
Patient is advised. He will get back with me and let me know what the PCP decides is to be done for diabetes control.

## 2019-08-15 NOTE — Telephone Encounter (Signed)
Stool studies negative.  I stool recommend a drug holiday from Metformin if PCP agreeable.  Will see if bowels change off Metformin.

## 2019-08-15 NOTE — Telephone Encounter (Signed)
He can try Imodium but not while running the trial of holding Metformin because we we then will not know if helped

## 2019-08-15 NOTE — Telephone Encounter (Signed)
One more question. He will discuss the Metformin with his PCP at the appointment on Thursday. He asks what can he take for the days of diarrhea.

## 2019-11-11 ENCOUNTER — Telehealth: Payer: Self-pay | Admitting: Nurse Practitioner

## 2019-11-11 NOTE — Telephone Encounter (Signed)
Patient contacted. He continues to have bowel movement issues of "running off" one day and being constipation the next. He has continued Metformin upon the direction of his diabetes provider. He has complaints of continued abdominal bloating. He states he does belch and he has indigestion. His son is "a Marine scientist and he has a Scientist, water quality. He told me it could be my gall bladder." The patient would like to be seen for evaluation. Appointment scheduled.

## 2019-11-11 NOTE — Telephone Encounter (Signed)
Patient is calling is experiencing a lot of bloating, says his stomach feels tight and gets sharp pain that shoots up to his chest for about a week now.

## 2019-11-13 NOTE — Progress Notes (Deleted)
Office Visit    Patient Name: [name] Date of Encounter: 11/13/2019  Primary Care Provider:  Roderic Scarce, MD Primary Cardiologist:  No primary care provider on file.  Chief Complaint    No chief complaint on file.  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  Past Medical History    Past Medical History:  Diagnosis Date  . Cancer ([place]) 2011   lymphoma L leg  . Diabetes mellitus without complication ([place])   . GERD (gastroesophageal reflux disease)   . Hx of lymphoma   . Hyperlipidemia   . Hypertension   . Hypotestosteronemia   . Sleep apnea 2013   wears CPAP   Past Surgical History:  Procedure Laterality Date  . CARDIAC CATHETERIZATION     Dr. Sabra Heck  . RIGHT/LEFT HEART CATH AND CORONARY ANGIOGRAPHY N/A 01/02/2017   Procedure: Right/Left Heart Cath and Coronary Angiography;  Surgeon: Jolaine Artist, MD;  Location: [facility];  Service: Cardiovascular;  Laterality: N/A;    Allergies  Allergies  Allergen Reactions  . Penicillins Hives and Other (See Comments)    "cillins" Has patient had a PCN reaction causing immediate rash, facial/tongue/throat swelling, SOB or lightheadedness with hypotension: unknown Has patient had a PCN reaction causing severe rash involving mucus membranes or skin necrosis: unknown Has patient had a PCN reaction that required hospitalization no Has patient had a PCN reaction occurring within the last 10 years:no If all of the above answers are "NO", then may proceed with Cephalosporin use.     History of Present Illness    [name] is a 74 y.o. male with PMH as above. Prior to establishing with [facility], he reportedly had a cath with Dr. Sabra Heck (5 +  years earlier) for CP with nonobstructive CAD (~40% blockages). In 2018, he was SOB with minimal exertion, such as walking up the driveway. He was struggling with R knee pain and swelling. He occasionally experienced bloating. He drank a lot of diet soda. His weight ran ~230lbs. His O2 sats dropped when he walked. He was seen by our Pulmonary HTN clinic 12/2016 with notes indicating suspicion of combination of WHO Group II (diastolic HF) and WHO Group III (OSA/OHS/chronic hypoxia). Echo at that time suggested at least moderate PH. 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 It was noted that this anomalous pulmonary vein might be contributing to RVE. CT chest was without evidence of interstitial lung dz. He was advised to continue O2 as needed and start with pulmonary rehab. CPAP was stressed, as well as the need for weight loss 2/2 OHS and recommendation for at least a 25 lb weight loss. Also recommended was V/Q scan later down the road though not yet performed. He was last seen by his primary cardiologist 05/2019 and felt to be doing well without any medication changes.   Home Medications    Prior to Admission medications   Medication Sig Start Date End Date Taking? Authorizing Provider  Ascorbic Acid (VITAMIN C) 1000 MG tablet Take 1,000 mg by mouth daily.    [provider]  atorvastatin (LIPITOR) 20 MG tablet Take  1 tablet (20 mg total) by mouth at bedtime. 04/29/18   Minna Merritts, MD  B Complex-Biotin-FA (VITAMIN B50 COMPLEX PO) Take 50 mg by mouth daily.    [provider]  Calcium Carb-Cholecalciferol (CALCIUM 600 + D PO) Take 1 tablet by mouth daily.    [provider]  doxazosin (CARDURA) 8 MG tablet Take 1 tablet by mouth once daily 08/08/19   Minna Merritts, MD  furosemide (LASIX) 20 MG tablet Take 20 mg by mouth daily as needed.     [provider]  hydrochlorothiazide (HYDRODIURIL) 25 MG tablet Take 1 tablet (25 mg total) by mouth daily. 04/29/18   Minna Merritts, MD  ipratropium-albuterol (DUONEB) 0.5-2.5 (3) MG/3ML SOLN ipratropium 0.5 mg-albuterol 3 mg (2.5 mg base)/3 mL nebulization soln  USE 1 AMPULE IN NEBULIZER 4 TIMES DAILY AS NEEDED FOR 5 DAYS    [provider]  lisinopril (PRINIVIL,ZESTRIL) 40 MG tablet Take 1 tablet (40 mg total) by mouth at bedtime. 07/20/18   Minna Merritts, MD  metFORMIN (GLUCOPHAGE) 500 MG tablet Take 500 mg by mouth 2 (two) times daily with a meal.    [provider]  metoprolol succinate (TOPROL-XL) 50 MG 24 hr tablet Take 1 tablet (50 mg) by mouth once daily in the morning. Take with or immediately following a meal.    [provider]  metoprolol tartrate (LOPRESSOR) 25 MG tablet TAKE 1 TABLET BY MOUTH ONCE DAILY AT NIGHT 06/03/19   Minna Merritts, MD  mirabegron ER (MYRBETRIQ) 50 MG TB24 tablet Take 50 mg by mouth daily.    [provider]  Multiple Vitamin (MULTIVITAMIN) tablet Take 1 tablet by mouth daily.    [provider]  Omega-3 Fatty Acids (OMEGA 3 PO) Take 1,600 mg by mouth daily.     [provider]  OXYGEN Inhale 2.5 L into the lungs daily.    [provider]  pantoprazole (PROTONIX) 40 MG tablet Take 1 tablet (40 mg total) by mouth daily. 04/29/18   Minna Merritts, MD  potassium chloride (K-DUR) 10 MEQ tablet Take 1 tablet (10 mEq total) by mouth daily. 09/23/16 06/01/19  Minna Merritts, MD  spironolactone (ALDACTONE) 25 MG tablet Take 25 mg by mouth at bedtime.     [provider]  Testosterone Cypionate 200 MG/ML KIT Inject 1 mL into the muscle every 14 (fourteen) days.    [provider]  vitamin E 400 UNIT capsule Take 400 Units by mouth daily.    [provider]    Review of Systems    ***.   All other systems reviewed and are otherwise negative except as noted  above.  Physical Exam    VS:  There were no vitals taken for this visit. , BMI There is no height or weight on file to calculate BMI. GEN: Well nourished, well developed, in no acute distress. HEENT: normal. Neck: Supple, no JVD, carotid bruits, or masses. Cardiac: RRR, no murmurs, rubs, or gallops. No clubbing, cyanosis, edema.  Radials/DP/PT 2+ and equal bilaterally.  Respiratory:  Respirations regular and unlabored, clear to auscultation bilaterally. GI: Soft, nontender, nondistended, BS + x 4. MS: no deformity or atrophy. Skin: warm and dry, no rash. Neuro:  Strength and sensation are intact. Psych: Normal affect.  Accessory Clinical Findings    ECG personally reviewed by me today - *** - no acute changes.  VITALS Reviewed today   Temp Readings from Last 3 Encounters:  07/26/19 97.9 F (36.6 C)  06/08/18 98.4 F (36.9 C) (Temporal)  01/02/17 97.7 F (36.5 C)   BP Readings from Last 3 Encounters:  07/26/19 134/70  06/01/19 118/76  06/08/18 127/84   Pulse Readings from Last 3 Encounters:  07/26/19 91  06/01/19 82  06/08/18 76    Wt Readings from Last 3 Encounters:  07/26/19 230 lb (104.3 kg)  06/01/19 221 lb (100.2 kg)  06/08/18 230 lb (104.3 kg)     LABS  reviewed today   CareEverwhere Labs present? Yes/No: Yes not current  Lab Results  Component Value Date   WBC 7.7 12/22/2016   HGB 17.2 (H) 12/22/2016   HCT 47.6 12/22/2016   MCV 91.4 12/22/2016   PLT 137 (L) 12/22/2016   Lab Results  Component Value Date   CREATININE 1.34 (H) 04/29/2018   BUN 21 04/29/2018   NA 141 04/29/2018   K 4.3 04/29/2018   CL 104 04/29/2018   CO2 28 04/29/2018   Lab Results  Component Value Date   ALT 21 04/29/2018   AST 24 04/29/2018   ALKPHOS 62 04/29/2018   BILITOT 1.2 04/29/2018   Lab Results  Component Value Date   CHOL 121 04/29/2018   HDL 28 (L) 04/29/2018   LDLCALC 62 04/29/2018   TRIG 157 (H) 04/29/2018   CHOLHDL 4.3 04/29/2018    Lab Results   Component Value Date   HGBA1C 5.4 04/29/2018   No results found for: TSH   STUDIES/PROCEDURES reviewed today   01/02/2017 R/LHC  Dist LAD lesion, 20 %stenosed.  The left ventricular ejection fraction is 55-65% by visual estimate. Findings: RA = 8 RV = 35/10 PA = 36/11 (21) PCW = 10 Ao = 104/65 (83) LV = 113/13 Fick cardiac output/index = 12.3/5.7 Ao sat = 94% High SVC sat = 70% Mid SVC sat 83% RA sat = 84% RV sat = 85% PA sat = 87%, 86% Anomalous PV sat = 99% Qp/Qs = 1.45 Assessment: 1. Minimal coronary plaque 2. Normal LV function EF 55-60% 3. Very mildly elevated pulmonary pressures (much lower than echo) 4. 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 Will get CT chest to further evaluate anomalous pulmonary vein. Given Qp/Qs < 1.5 and lack of RV strain will likely continue to tolerate well.   09/23/2016 Echo - Left ventricle: The cavity size was normal. Wall thickness was  normal. Systolic function was normal. The estimated ejection  fraction was in the range of 50% to 55%. Wall motion was normal;  there were no regional wall motion abnormalities. Doppler  parameters are consistent with abnormal left ventricular  relaxation (grade 1 diastolic dysfunction).  - Ventricular septum: The contour showed systolic flattening. These  changes are consistent with RV pressure overload.  - Left atrium: The atrium was mildly dilated.  - Right ventricle: The cavity size was moderately dilated. Wall  thickness was mildly increased. Systolic function was mildly  reduced.  - Pulmonary arteries: Systolic pressure was moderately to severely  increased. PA peak pressure: 60 mm Hg (S).  - Inferior vena cava: The vessel was dilated. The respirophasic  diameter changes were blunted (< 50%), consistent with elevated  central venous pressure.   Assessment & Plan    ***  Medication changes: *** Labs  ordered: *** Studies / Imaging ordered: *** Future considerations: *** Disposition: ***  Total time spent with patient today *** minutes. This includes reviewing records, evaluating the patient, and  coordinating care. Face-to-face time >50%.    Arvil Chaco, PA-C 11/13/2019

## 2019-11-15 ENCOUNTER — Ambulatory Visit: Payer: Medicare Other | Admitting: Physician Assistant

## 2019-11-16 ENCOUNTER — Other Ambulatory Visit: Payer: Medicare Other

## 2019-11-16 ENCOUNTER — Encounter: Payer: Self-pay | Admitting: Nurse Practitioner

## 2019-11-16 ENCOUNTER — Other Ambulatory Visit: Payer: Self-pay

## 2019-11-16 ENCOUNTER — Ambulatory Visit (INDEPENDENT_AMBULATORY_CARE_PROVIDER_SITE_OTHER): Payer: Medicare Other | Admitting: Nurse Practitioner

## 2019-11-16 VITALS — BP 102/58 | HR 74 | Temp 97.3°F | Ht 68.5 in | Wt 221.4 lb

## 2019-11-16 DIAGNOSIS — R142 Eructation: Secondary | ICD-10-CM

## 2019-11-16 DIAGNOSIS — R194 Change in bowel habit: Secondary | ICD-10-CM

## 2019-11-16 DIAGNOSIS — R143 Flatulence: Secondary | ICD-10-CM | POA: Diagnosis not present

## 2019-11-16 NOTE — Patient Instructions (Addendum)
If you are age 75 or older, your body mass index should be between 23-30. Your Body mass index is 33.17 kg/m. If this is out of the aforementioned range listed, please consider follow up with your Primary Care Provider.  If you are age 69 or younger, your body mass index should be between 19-25. Your Body mass index is 33.17 kg/m. If this is out of the aformentioned range listed, please consider follow up with your Primary Care Provider.   Your provider has requested that you go to the basement level for lab work before leaving today. Press "B" on the elevator. The lab is located at the first door on the left as you exit the elevator.  Please go to the lab for Pancreatic Elastase. The order is still active.  You have been given the phone number to [facility].  Please let us know if you would like to proceed with this.  If so, you will need to come back to the office to pick up kit.  Thank you for choosing me and [facility].   Tye Savoy, NP  You have been given a testing kit to check for small intestine bacterial overgrowth (SIBO) which is completed by a company named [facility]. Make sure to return your test in the mail using the return mailing label given you along with the kit. Your demographic and insurance information have already been sent to the company and they should be in contact with you over the next week regarding this test. Make sure to discuss with [facility] PRIOR to having the test if they have gotten informatoin from your insurance company as to how much your testing will cost out of pocket, if any. Please keep in mind that you will be getting a call from phone number [phone] or a similar number. If you do not hear from them within this time frame, please call our office at [phone].

## 2019-11-16 NOTE — Progress Notes (Signed)
IMPRESSION and PLAN:    [name] is a 74 y.o. male with a pmh significant for, not necessarily limited to diabetes, obesity, hyperlipidemia, hypertension, history of lymphoma, sleep apnea, GERD, and colon polyps  # Bloating, intermittent abdominal distention, flatus, chronic loose stool, now alternating with constipation a couple of times a week.  -Still wonder if Metformin contributes to loose stool. -Fecal elastase was ordered but never collected.  Will ask patient to complete that study -Given history of diabetes patient at risk for SIBO.  In light of frequent bloating and loose stool will arrange for SIBO testing if insurance will pay for it -Further recommendations following test results -I am going to hold off on recommending fiber supplement to regulate bowel movements since it may exacerbate the bloating / flatulence    # History of adenomatous colon polyps.  --Due for surveillance colonoscopy October 2024  # GERD, pyrosis controlled on daily PPI  HPI:    Primary GI:  Dr. Ardis Hughs  Chief complaint : Abdominal bloating, distention, altered bowel habits, flatus  **History comes from the chart and patient  74 yo male with a history of a partial SBO of unclear etiology.  We saw him for the first time August 2019 for colon cancer screening.  Because of persistent LUQ after SBO we also scheduled him for EGD in addition to colonoscopy in October 2019 which was remarkable for non-H.pylori gastritis . Colonoscopy remarkable for diverticulosis and a couple of small polyps ( tubular adenomas) and diverticulosis.  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  Stool for C. difficile was negative as was  lactoferrin.  Fecal elastase ordered but never collected.  Recommended patient take a drug holiday from Metformin to see if diarrhea improved  [name] returns with complaints of bloating, abdominal distention, flatus, chest pain relieved with flatus, alternating bowel habits with loose stool and constipation.  He never did take a drug holiday from Metformin upon the advice of prescribing provider.  Most days of the week he has 3-4 loose nonbloody bowel movements a day.  About 2 days out of the week he has hard stools with straining.  He has frequent chest pain relieved with flatus and or bowel movements.  Patient has a history of GERD with heartburn, asymptomatic on PPI.  Review of systems:     No chest pain, no SOB, no fevers, no urinary sx   Past Medical History:  Diagnosis Date  . Cancer ([place]) 2011   lymphoma L leg  . Diabetes mellitus without complication ([place])   . GERD (gastroesophageal reflux disease)   . Hx of lymphoma   . Hyperlipidemia   . Hypertension   . Hypotestosteronemia   . Sleep apnea 2013   wears CPAP    Patient's surgical history, family medical history, social history, medications and allergies were all reviewed in Epic   Creatinine clearance cannot be calculated (Patient's most recent lab result is older than the maximum 21 days allowed.)  Current Outpatient Medications  Medication Sig Dispense Refill  . Ascorbic Acid (VITAMIN C) 1000 MG tablet Take 1,000 mg by mouth daily.    Marland Kitchen atorvastatin (LIPITOR) 20 MG tablet Take 1 tablet (20 mg total)  by mouth at bedtime. 90 tablet 3  . B Complex-Biotin-FA (VITAMIN B50 COMPLEX PO) Take 50 mg by mouth daily.    . Calcium Carb-Cholecalciferol (CALCIUM 600 + D PO) Take 1 tablet by mouth daily.    Marland Kitchen doxazosin (CARDURA) 8 MG tablet Take 1 tablet by mouth once daily 90 tablet 2  . furosemide (LASIX) 20 MG tablet Take 20 mg by mouth daily as needed.     . hydrochlorothiazide (HYDRODIURIL) 25 MG tablet Take 1 tablet (25 mg  total) by mouth daily. 90 tablet 3  . ipratropium-albuterol (DUONEB) 0.5-2.5 (3) MG/3ML SOLN ipratropium 0.5 mg-albuterol 3 mg (2.5 mg base)/3 mL nebulization soln  USE 1 AMPULE IN NEBULIZER 4 TIMES DAILY AS NEEDED FOR 5 DAYS    . lisinopril (PRINIVIL,ZESTRIL) 40 MG tablet Take 1 tablet (40 mg total) by mouth at bedtime. 90 tablet 1  . metFORMIN (GLUCOPHAGE) 500 MG tablet Take 500 mg by mouth 2 (two) times daily with a meal.    . metoprolol succinate (TOPROL-XL) 50 MG 24 hr tablet Take 1 tablet (50 mg) by mouth once daily in the morning. Take with or immediately following a meal.    . metoprolol tartrate (LOPRESSOR) 25 MG tablet TAKE 1 TABLET BY MOUTH ONCE DAILY AT NIGHT 90 tablet 3  . Multiple Vitamin (MULTIVITAMIN) tablet Take 1 tablet by mouth daily.    . Omega-3 Fatty Acids (OMEGA 3 PO) Take 1,600 mg by mouth daily.     Marland Kitchen oxybutynin (DITROPAN) 5 MG tablet Take 5 mg by mouth daily.    . OXYGEN Inhale 2.5 L into the lungs daily.    . pantoprazole (PROTONIX) 40 MG tablet Take 1 tablet (40 mg total) by mouth daily. 90 tablet 3  . potassium chloride (K-DUR) 10 MEQ tablet Take 1 tablet (10 mEq total) by mouth daily. 90 tablet 3  . spironolactone (ALDACTONE) 25 MG tablet Take 25 mg by mouth at bedtime.     . Testosterone Cypionate 200 MG/ML KIT Inject 1 mL into the muscle every 14 (fourteen) days.    . vitamin E 400 UNIT capsule Take 400 Units by mouth daily.     No current facility-administered medications for this visit.    Physical Exam:     BP (!) 102/58 (BP Location: Left Arm, Patient Position: Sitting, Cuff Size: Large)   Pulse 74   Temp (!) 97.3 F (36.3 C)   Ht 5' 8.5" (1.74 m)   Wt 221 lb 6 oz (100.4 kg)   SpO2 96%   BMI 33.17 kg/m   GENERAL:  Pleasant male in NAD PSYCH: : Cooperative, normal affect CARDIAC:  RRR,  no peripheral edema PULM: Normal respiratory effort, lungs CTA bilaterally, no wheezing ABDOMEN:  Nondistended, soft, nontender. No obvious masses, no  hepatomegaly,  normal bowel sounds SKIN:  turgor, no lesions seen Musculoskeletal:  Normal muscle tone, normal strength NEURO: Alert and oriented x 3, no focal neurologic deficits   Tye Savoy , NP 11/16/2019, 1:42 PM

## 2019-11-17 NOTE — Progress Notes (Signed)
I agree with the above note, plan 

## 2019-11-18 ENCOUNTER — Telehealth: Payer: Self-pay | Admitting: Nurse Practitioner

## 2019-11-18 NOTE — Telephone Encounter (Signed)
Patient has the SIBO breath test kit given to him at his visit on 11/16/19. He is concerned "the company has not called me." Reassured the patient that the form was faxed.  Would you refax the form for the patient?   Stool test has not resulted per [employer]. Patient aware.  Thanks

## 2019-11-24 LAB — PANCREATIC ELASTASE, FECAL: Pancreatic Elastase-1, Stool: 466 mcg/g

## 2019-11-24 NOTE — Telephone Encounter (Signed)
Pt called regarding SIBO test.

## 2019-11-24 NOTE — Telephone Encounter (Signed)
Spoke with the [facility] lab. The patient is not "in the system" as of yet. Representative asked the form be faxed again.  Confirmed the fax went through. Called [facility]. The pancreatic elastase, fecal has not resulted. The turn around is expected by tomorrow afternoon (11/25/19). Patient advised of these things.

## 2020-01-13 ENCOUNTER — Ambulatory Visit: Payer: Medicare Other | Admitting: Gastroenterology

## 2020-02-28 ENCOUNTER — Other Ambulatory Visit (INDEPENDENT_AMBULATORY_CARE_PROVIDER_SITE_OTHER): Payer: Medicare Other

## 2020-02-28 ENCOUNTER — Encounter: Payer: Self-pay | Admitting: Gastroenterology

## 2020-02-28 ENCOUNTER — Ambulatory Visit (INDEPENDENT_AMBULATORY_CARE_PROVIDER_SITE_OTHER): Payer: Medicare Other | Admitting: Gastroenterology

## 2020-02-28 DIAGNOSIS — R197 Diarrhea, unspecified: Secondary | ICD-10-CM | POA: Diagnosis not present

## 2020-02-28 DIAGNOSIS — R634 Abnormal weight loss: Secondary | ICD-10-CM

## 2020-02-28 LAB — TSH: TSH: 0.65 u[IU]/mL (ref 0.35–4.50)

## 2020-02-28 NOTE — Patient Instructions (Addendum)
If you are age 75 or older, your body mass index should be between 23-30. Your Body mass index is 33.35 kg/m. If this is out of the aforementioned range listed, please consider follow up with your Primary Care Provider.  If you are age 42 or younger, your body mass index should be between 19-25. Your Body mass index is 33.35 kg/m. If this is out of the aformentioned range listed, please consider follow up with your Primary Care Provider.   Your provider has requested that you go to the basement level for lab work before leaving today. Press "B" on the elevator. The lab is located at the first door on the left as you exit the elevator.  Due to recent changes in healthcare laws, you may see the results of your imaging and laboratory studies on MyChart before your provider has had a chance to review them.  We understand that in some cases there may be results that are confusing or concerning to you. Not all laboratory results come back in the same time frame and the provider may be waiting for multiple results in order to interpret others.  Please give [place] 48 hours in order for your provider to thoroughly review all the results before contacting the office for clarification of your results.   Please purchase the following medications over the counter and take as directed:  START: Imodium take 1 tablet shortly after waking up every morning.   You have been scheduled for a CT scan of the abdomen and pelvis at [facility] (1st Floor).  You are scheduled on 03-05-20 at 3:30. You should arrive 15 minutes prior to your appointment time for registration. Please follow the written instructions below on the day of your exam:  WARNING: IF YOU ARE ALLERGIC TO IODINE/X-RAY DYE, PLEASE NOTIFY RADIOLOGY IMMEDIATELY AT [phone]! YOU WILL BE GIVEN A 13 HOUR PREMEDICATION PREP.  1) Do not eat or drink anything after 11:30am (4 hours prior to your test) 2) You have been given 2 bottles of oral  contrast to drink. The solution may taste better if refrigerated, but do NOT add ice or any other liquid to this solution. Shake well before drinking.    Drink 1 bottle of contrast @ 1:30pm (2 hours prior to your exam)  Drink 1 bottle of contrast @ 2:30pm (1 hour prior to your exam)  You may take any medications as prescribed with a small amount of water, if necessary. If you take any of the following medications: METFORMIN, GLUCOPHAGE, GLUCOVANCE, AVANDAMET, RIOMET, FORTAMET, [place] MET, JANUMET, GLUMETZA or METAGLIP, you MAY be asked to HOLD this medication 48 hours AFTER the exam.  The purpose of you drinking the oral contrast is to aid in the visualization of your intestinal tract. The contrast solution may cause some diarrhea. Depending on your individual set of symptoms, you may also receive an intravenous injection of x-ray contrast/dye.   This test typically takes 30-45 minutes to complete.  If you have any questions regarding your exam or if you need to reschedule, you may call the Radiology department at [phone] between the hours of 8:00 am and 5:00 pm, Monday-Friday.  __________________________________________________________   Thank you for entrusting me with your care and choosing [facility].  Dr Ardis Hughs

## 2020-02-28 NOTE — Progress Notes (Signed)
Review of pertinent gastrointestinal problems: 1.  Adenomatous colon polyps.  Colonoscopy October 2019 found 2 subcentimeter adenomas and diverticulosis. 2.  Left upper quadrant pain led to eventual EGD October 2019 Dr. Ardis Hughs which showed mild gastritis, H. pylori negative on pathology. 3.  Chronic loose stools.  Evaluation November 2020 stool testing for C. difficile and lactoferrin was negative.  Pancreatic elastase stool March 2021 was normal he was recommended to try a drug holiday from Metformin but he never did that.  HPI: This is a very pleasant 74 year old man who was last here several months ago and saw [place].  He has been bothered by loose stools for about a year now.  He will go several times a day with some minor urgency.  Never bloody.  He started Metamucil about 3 months ago, powder form.  This does seem to bulk of his for stools a day but then after that he has loose stools.  He does not have a family history of colon cancer, he has no significant abdominal pains.  Crohn's and colitis do not run in his family.  He has intentionally lost 20 to 30 pounds in the past 6 to 7 months with dietary changes.  Tells me his primary care physician switched him to ER form of Metformin and told him that this does not cause a bowel troubles in the ER for him.  I am not sure I agree with.   ROS: complete GI ROS as described in HPI, all other review negative.  Constitutional:  No unintentional weight loss   Past Medical History:  Diagnosis Date  . Cancer ([place]) 2011   lymphoma L leg  . Diabetes mellitus without complication ([facility])   . GERD (gastroesophageal reflux disease)   . Hx of lymphoma   . Hyperlipidemia   . Hypertension   . Hypotestosteronemia   . Sleep apnea 2013   wears CPAP    Past Surgical History:  Procedure Laterality Date  . CARDIAC CATHETERIZATION     Dr. Sabra Heck  . RIGHT/LEFT HEART CATH AND CORONARY ANGIOGRAPHY N/A 01/02/2017   Procedure: Right/Left Heart Cath and  Coronary Angiography;  Surgeon: Jolaine Artist, MD;  Location: [facility];  Service: Cardiovascular;  Laterality: N/A;    Current Outpatient Medications  Medication Sig Dispense Refill  . Ascorbic Acid (VITAMIN C) 1000 MG tablet Take 1,000 mg by mouth daily.    Marland Kitchen atorvastatin (LIPITOR) 20 MG tablet Take 1 tablet (20 mg total) by mouth at bedtime. 90 tablet 3  . B Complex-Biotin-FA (VITAMIN B50 COMPLEX PO) Take 50 mg by mouth daily.    . Calcium Carb-Cholecalciferol (CALCIUM 600 + D PO) Take 1 tablet by mouth daily.    Marland Kitchen doxazosin (CARDURA) 8 MG tablet Take 1 tablet by mouth once daily 90 tablet 2  . furosemide (LASIX) 20 MG tablet Take 20 mg by mouth daily as needed.     . hydrochlorothiazide (HYDRODIURIL) 25 MG tablet Take 1 tablet (25 mg total) by mouth daily. 90 tablet 3  . ipratropium-albuterol (DUONEB) 0.5-2.5 (3) MG/3ML SOLN ipratropium 0.5 mg-albuterol 3 mg (2.5 mg base)/3 mL nebulization soln  USE 1 AMPULE IN NEBULIZER 4 TIMES DAILY AS NEEDED FOR 5 DAYS    . lisinopril (PRINIVIL,ZESTRIL) 40 MG tablet Take 1 tablet (40 mg total) by mouth at bedtime. 90 tablet 1  . metFORMIN (GLUCOPHAGE-XR) 500 MG 24 hr tablet Take 2 tablets by mouth daily.    . metoprolol succinate (TOPROL-XL) 50 MG 24 hr  tablet Take 1 tablet (50 mg) by mouth once daily in the morning. Take with or immediately following a meal.    . metoprolol tartrate (LOPRESSOR) 25 MG tablet TAKE 1 TABLET BY MOUTH ONCE DAILY AT NIGHT 90 tablet 3  . Multiple Vitamin (MULTIVITAMIN) tablet Take 1 tablet by mouth daily.    . Omega-3 Fatty Acids (OMEGA 3 PO) Take 1,600 mg by mouth daily.     Marland Kitchen oxybutynin (DITROPAN) 5 MG tablet Take 5 mg by mouth daily.    . OXYGEN Inhale 2.5 L into the lungs daily.    . pantoprazole (PROTONIX) 40 MG tablet Take 1 tablet (40 mg total) by mouth daily. 90 tablet 3  . psyllium (METAMUCIL) 58.6 % powder Take by mouth daily.    Marland Kitchen spironolactone (ALDACTONE) 25 MG tablet Take 25 mg by mouth at  bedtime.     . Testosterone Cypionate 200 MG/ML KIT Inject 1 mL into the muscle every 14 (fourteen) days.    . vitamin E 400 UNIT capsule Take 400 Units by mouth daily.    . potassium chloride (K-DUR) 10 MEQ tablet Take 1 tablet (10 mEq total) by mouth daily. 90 tablet 3   No current facility-administered medications for this visit.    Allergies as of 02/28/2020 - Review Complete 02/28/2020  Allergen Reaction Noted  . Penicillins Hives and Other (See Comments) 01/30/2016    Family History  Problem Relation Age of Onset  . Heart disease Father   . Heart attack Father        x3  . Colon cancer Neg Hx   . Rectal cancer Neg Hx   . Stomach cancer Neg Hx   . Esophageal cancer Neg Hx     Social History   Socioeconomic History  . Marital status: Married    Spouse name: Not on file  . Number of children: Not on file  . Years of education: Not on file  . Highest education level: Not on file  Occupational History  . Not on file  Tobacco Use  . Smoking status: Never Smoker  . Smokeless tobacco: Never Used  Vaping Use  . Vaping Use: Never used  Substance and Sexual Activity  . Alcohol use: No  . Drug use: No  . Sexual activity: Not on file  Other Topics Concern  . Not on file  Social History Narrative  . Not on file   Social Determinants of Health   Financial Resource Strain:   . Difficulty of Paying Living Expenses:   Food Insecurity:   . Worried About Charity fundraiser in the Last Year:   . Arboriculturist in the Last Year:   Transportation Needs:   . Film/video editor (Medical):   Marland Kitchen Lack of Transportation (Non-Medical):   Physical Activity:   . Days of Exercise per Week:   . Minutes of Exercise per Session:   Stress:   . Feeling of Stress :   Social Connections:   . Frequency of Communication with Friends and Family:   . Frequency of Social Gatherings with Friends and Family:   . Attends Religious Services:   . Active Member of Clubs or Organizations:    . Attends Archivist Meetings:   Marland Kitchen Marital Status:   Intimate Partner Violence:   . Fear of Current or Ex-Partner:   . Emotionally Abused:   Marland Kitchen Physically Abused:   . Sexually Abused:      Physical Exam: Ht 5' 8.5" (  1.74 m)   Wt 222 lb 9.6 oz (101 kg)   BMI 33.35 kg/m  Constitutional: generally well-appearing Psychiatric: alert and oriented x3 Abdomen: soft, nontender, nondistended, no obvious ascites, no peritoneal signs, normal bowel sounds No peripheral edema noted in lower extremities  Assessment and plan: 74 y.o. male with chronic loose stools  Screening colonoscopy less than 2 years ago showed no clear etiology.  This was obviously before his symptoms started and perhaps may need to be repeated however explained to him that I think it is unlikely there is anything in his colon that is causing his loose stools.  His primary care physician changed him to extended release form of Metformin and told him that it does not cause bowel issues.  I am not sure I agree with that.  I think it is still possible that his Metformin is contributing to some of his bowel troubles however since he has been on the medicine for 7 years that does make a little less likely.  We are going to check a TSH today.  I am also getting a CT scan abdomen and pelvis with IV and oral contrast given his loose stools and his weight loss, albeit intentional it is still quite impressive that he has lost almost 30 pounds in 6 months.  In the meantime he is going start taking a single over-the-counter Imodium 1 pill once daily shortly after waking every morning.  Please see the "Patient Instructions" section for addition details about the plan.  Owens Loffler, MD [facility] 02/28/2020, 9:17 AM   Total time on date of encounter was 58mnutes (this included time spent preparing to see the patient reviewing records; obtaining and/or reviewing separately obtained history; performing a medically  appropriate exam and/or evaluation; counseling and educating the patient and family if present; ordering medications, tests or procedures if applicable; and documenting clinical information in the health record).

## 2020-02-29 ENCOUNTER — Telehealth: Payer: Self-pay | Admitting: Gastroenterology

## 2020-02-29 DIAGNOSIS — R197 Diarrhea, unspecified: Secondary | ICD-10-CM

## 2020-02-29 NOTE — Telephone Encounter (Signed)
istat ordered as requested

## 2020-03-05 ENCOUNTER — Other Ambulatory Visit: Payer: Self-pay

## 2020-03-05 ENCOUNTER — Ambulatory Visit (HOSPITAL_COMMUNITY)
Admission: RE | Admit: 2020-03-05 | Discharge: 2020-03-05 | Disposition: A | Discharge status: Home / Self Care | Payer: Medicare Other | Source: Ambulatory Visit | Attending: Gastroenterology | Admitting: Gastroenterology

## 2020-03-05 ENCOUNTER — Encounter (HOSPITAL_COMMUNITY): Payer: Self-pay

## 2020-03-05 DIAGNOSIS — R197 Diarrhea, unspecified: Secondary | ICD-10-CM | POA: Insufficient documentation

## 2020-03-05 DIAGNOSIS — R634 Abnormal weight loss: Secondary | ICD-10-CM | POA: Diagnosis present

## 2020-03-05 LAB — POCT I-STAT CREATININE: Creatinine, Ser: 1.7 mg/dL — ABNORMAL HIGH (ref 0.61–1.24)

## 2020-03-05 MED ORDER — IOHEXOL 300 MG/ML  SOLN
75.0000 mL | Freq: Once | INTRAMUSCULAR | Status: AC | PRN
  Administered 2020-03-05: 75 mL via INTRAVENOUS

## 2020-03-05 MED ORDER — SODIUM CHLORIDE (PF) 0.9 % IJ SOLN
INTRAMUSCULAR | Status: AC
  Filled 2020-03-05: qty 50

## 2020-04-30 DIAGNOSIS — M25562 Pain in left knee: Secondary | ICD-10-CM | POA: Insufficient documentation

## 2020-05-18 ENCOUNTER — Other Ambulatory Visit: Payer: Self-pay | Admitting: Cardiovascular Disease

## 2020-06-05 ENCOUNTER — Telehealth: Payer: Self-pay

## 2020-06-05 NOTE — Telephone Encounter (Signed)
A user error has taken place.

## 2020-06-05 NOTE — Telephone Encounter (Signed)
Reached out to [facility], they were unable to contact the patient and have not received the testing kit back.

## 2020-06-18 ENCOUNTER — Other Ambulatory Visit: Payer: Self-pay | Admitting: Cardiovascular Disease

## 2020-07-06 ENCOUNTER — Other Ambulatory Visit: Payer: Self-pay | Admitting: Cardiovascular Disease

## 2020-07-09 NOTE — Telephone Encounter (Signed)
Rx request sent to pharmacy.  

## 2020-07-09 NOTE — Telephone Encounter (Signed)
Scheduled for 12/7 w/ gollan

## 2020-07-09 NOTE — Telephone Encounter (Signed)
Please schedule office visit for refills. Thank you! 

## 2020-07-13 ENCOUNTER — Telehealth: Payer: Self-pay | Admitting: Cardiovascular Disease

## 2020-07-13 NOTE — Telephone Encounter (Signed)
Pt called to report that he has been put on 30 day Colchicine for a gout flare up and he is 12 days on and he is having leg cramping and was told by the pharmacist that it can be a side effect when taking with Lipitor... he is asking if he can hold the Lipitor until he finishes the 30 day course of the colchicine.   Will need to forward to Dr. Rockey Situ for review and approval.

## 2020-07-13 NOTE — Telephone Encounter (Signed)
Yes he can hold Lipitor until a few days after he finished the colchicine Holding for 2 weeks will not cause any harm.

## 2020-07-13 NOTE — Telephone Encounter (Signed)
Pt advised and agreed with the recommendation.

## 2020-07-13 NOTE — Telephone Encounter (Signed)
Patient states he is taking a medication for gout (nd states this is making his legs stiff, please call to discuss if this medication will react with his cholesterol medication.

## 2020-08-13 NOTE — Progress Notes (Signed)
Date:  08/14/2020   ID:  [name], DOB 03/19/1946, MRN [id number]  Patient Location:  [address] [place] [address]   Provider location:   [facility], [place] office  PCP:  Roderic Scarce, MD  Cardiologist:  Arvid Right [facility]   Chief Complaint  Patient presents with   office visit    12 month F/U-No new cardiac concerns; Meds verbally reviewed with patient.    History of Present Illness:    [name] is a 74 y.o. 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 Echo January 2018 with moderate pulm HTN Right heart catheterization with only minimally elevated right heart pressures compliant with his CPAP He presents for follow-up of hishypertension, SOB, hyperlipidemia, Coronary disease seen on previous PET scans   Last seen by telemetry visit September 2020 At that time sedentary, no regular exercise, knee pain, Taking Lasix potassium as needed with no leg swelling  In follow-up today, and is having Severe left knee pain, Followed by orthopedics Very limited inability to walk secondary to pain Using a cane for one month,  Had 2 shots to same knee, has not noticed much improvement Has had MRI  Denies SOB, not taking Lasix on a regular basis Has some swelling left leg, attributes this to knee issue Left was side of XRT for cancer Reports chronic swelling  Denies PND orthopnea, no swelling right leg, no abdominal distention For to take  Lasix as needed  Discussed prior right heart catheterization numbers, prior echocardiogram  EKG personally reviewed by myself on todays visit NSR rate 83 RBBB    Lab Results  Component Value Date   CHOL 121 04/29/2018   HDL 28 (L) 04/29/2018   LDLCALC 62 04/29/2018   TRIG 157 (H) 04/29/2018     Other past medical history reviewed previoustesting shows PET scan 2013 and 2015 showing coronary calcifications Other scanning showing mild aortic atherosclerosis    Prior CV studies:   The following studies were reviewed today:   Past Medical History:  Diagnosis Date   Cancer ([place]) 2011   lymphoma L leg   Diabetes mellitus without complication (HCC)    GERD (gastroesophageal reflux disease)    Hx of lymphoma    Hyperlipidemia    Hypertension    Hypotestosteronemia    Sleep apnea 2013   wears CPAP   Past Surgical History:  Procedure Laterality Date   CARDIAC CATHETERIZATION     Dr. Sabra Heck   RIGHT/LEFT HEART CATH AND CORONARY ANGIOGRAPHY N/A 01/02/2017   Procedure: Right/Left Heart Cath and Coronary Angiography;  Surgeon: Jolaine Artist, MD;  Location: [facility];  Service: Cardiovascular;  Laterality: N/A;     Allergies:   Penicillins   Social History   Tobacco Use   Smoking status: Never Smoker   Smokeless tobacco: Never Used  Vaping Use   Vaping Use: Never used  Substance Use Topics   Alcohol use: No   Drug use: No  Current Outpatient Medications on File Prior to Visit  Medication Sig Dispense Refill   allopurinol (ZYLOPRIM) 100 MG tablet Take 200 mg by mouth daily.     Ascorbic Acid (VITAMIN C) 1000 MG tablet Take 1,000 mg by mouth daily.     B Complex-Biotin-FA (VITAMIN B50 COMPLEX PO) Take 50 mg by mouth daily.     Calcium Carb-Cholecalciferol (CALCIUM 600 + D PO) Take 1 tablet by mouth daily.     ipratropium-albuterol (DUONEB) 0.5-2.5 (3) MG/3ML SOLN ipratropium 0.5 mg-albuterol 3 mg (2.5 mg base)/3 mL nebulization  soln  USE 1 AMPULE IN NEBULIZER 4 TIMES DAILY AS NEEDED FOR 5 DAYS     metFORMIN (GLUCOPHAGE-XR) 500 MG 24 hr tablet Take 500 mg by mouth daily.      Multiple Vitamin (MULTIVITAMIN) tablet Take 1 tablet by mouth daily.     Omega-3 Fatty Acids (OMEGA 3 PO) Take 1,600 mg by mouth daily.      oxybutynin (DITROPAN) 5 MG tablet Take 5 mg by mouth daily.     OXYGEN Inhale 2.5 L into the lungs daily.     pantoprazole (PROTONIX) 40 MG tablet Take 1 tablet (40 mg total) by mouth daily. 90 tablet 3   psyllium (METAMUCIL) 58.6 % powder Take by mouth daily.     Testosterone Cypionate 200 MG/ML KIT Inject 1 mL into the muscle every 14 (fourteen) days.     vitamin E 400 UNIT capsule Take 400 Units by mouth daily.     No current facility-administered medications on file prior to visit.     Family Hx: The patient's family history includes Heart attack in his father; Heart disease in his father. There is no history of Colon cancer, Rectal cancer, Stomach cancer, or Esophageal cancer.  ROS:   Please see the history of present illness.    Review of Systems  Constitutional: Negative.   HENT: Negative.   Respiratory: Negative.   Cardiovascular: Negative.   Gastrointestinal: Negative.   Musculoskeletal: Positive for joint pain.  Neurological: Negative.   Psychiatric/Behavioral: Negative.   All other systems reviewed and are negative.    Labs/Other Tests and Data Reviewed:    Recent Labs: 02/28/2020: TSH 0.65 03/05/2020: Creatinine, Ser 1.70   Recent Lipid Panel Lab Results  Component Value Date/Time   CHOL 121 04/29/2018 10:12 AM   CHOL 137 09/22/2016 09:13 AM   TRIG 157 (H) 04/29/2018 10:12 AM   HDL 28 (L) 04/29/2018 10:12 AM   HDL 42 09/22/2016 09:13 AM   CHOLHDL 4.3 04/29/2018 10:12 AM   LDLCALC 62 04/29/2018 10:12 AM   LDLCALC 68 09/22/2016 09:13 AM    Wt Readings from Last 3 Encounters:  08/14/20 222 lb (100.7 kg)  02/28/20 222 lb 9.6 oz (101 kg)  11/16/19 221 lb 6 oz  (100.4 kg)     Exam:    Vital Signs: Vital signs may also be detailed in the HPI BP 130/84 (BP Location: Left Arm, Patient Position: Sitting, Cuff Size: Large)    Pulse 83    Ht _0  (1.727 m)    Wt 222 lb (100.7 kg)    SpO2 98%    BMI 33.75 kg/m   Constitutional:  oriented to person, place, and time. No distress.  HENT:  Head: Grossly normal Eyes:  no discharge. No scleral icterus.  Neck: No JVD, no carotid bruits  Cardiovascular: Regular rate and rhythm, no murmurs appreciated Pulmonary/Chest: Clear to auscultation bilaterally, no wheezes or rails Abdominal: Soft.  no distension.  no tenderness.  Musculoskeletal: Normal range of motion Neurological:  normal muscle tone. Coordination normal. No atrophy Skin: Skin warm and dry Psychiatric: normal affect, pleasant   ASSESSMENT & PLAN:    Problem List Items Addressed This Visit      Cardiology Problems   Pulmonary HTN (HCC)   Relevant Medications   doxazosin (CARDURA) 8 MG tablet   furosemide (LASIX) 20 MG tablet   hydrochlorothiazide (HYDRODIURIL) 25 MG tablet   lisinopril (ZESTRIL) 40 MG tablet   metoprolol succinate (TOPROL-XL) 50 MG 24 hr tablet   metoprolol tartrate (LOPRESSOR) 25 MG tablet   spironolactone (ALDACTONE) 25 MG tablet   Other Relevant Orders   EKG 12-Lead   Hyperlipidemia   Relevant Medications   doxazosin (CARDURA) 8 MG tablet   furosemide (LASIX) 20 MG tablet   hydrochlorothiazide (HYDRODIURIL) 25 MG tablet   lisinopril (ZESTRIL) 40 MG tablet   metoprolol succinate (TOPROL-XL) 50 MG 24 hr tablet   metoprolol tartrate (LOPRESSOR) 25 MG tablet   spironolactone (ALDACTONE) 25 MG tablet   Essential hypertension   Relevant Medications   doxazosin (CARDURA) 8 MG tablet   furosemide (LASIX) 20 MG tablet   hydrochlorothiazide (HYDRODIURIL) 25 MG tablet   lisinopril (ZESTRIL) 40 MG tablet   metoprolol succinate (TOPROL-XL) 50 MG 24 hr tablet   metoprolol tartrate (LOPRESSOR) 25 MG tablet    spironolactone (ALDACTONE) 25 MG tablet   PAD (peripheral artery disease) (HCC)   Relevant Medications   doxazosin (CARDURA) 8 MG tablet   furosemide (LASIX) 20 MG tablet   hydrochlorothiazide (HYDRODIURIL) 25 MG tablet   lisinopril (ZESTRIL) 40 MG tablet   metoprolol succinate (TOPROL-XL) 50 MG 24 hr tablet   metoprolol tartrate (LOPRESSOR) 25 MG tablet   spironolactone (ALDACTONE) 25 MG tablet   CAD (coronary artery disease) - Primary   Relevant Medications   doxazosin (CARDURA) 8 MG tablet   furosemide (LASIX) 20 MG tablet   hydrochlorothiazide (HYDRODIURIL) 25 MG tablet   lisinopril (ZESTRIL) 40 MG tablet   metoprolol succinate (TOPROL-XL) 50 MG 24 hr tablet   metoprolol tartrate (LOPRESSOR) 25 MG tablet   spironolactone (ALDACTONE) 25 MG tablet   Other Relevant Orders   EKG 12-Lead     Other   Morbid obesity (HCC)   Type 2 diabetes mellitus without complication, without long-term current use of insulin (HCC)   Relevant Medications   lisinopril (ZESTRIL) 40 MG tablet     Mixed hyperlipidemia - Cholesterol is at goal on the current lipid regimen. No changes to the medications were made.  Essential hypertension - Plan: EKG 12-Lead Blood pressure is well controlled on today's visit. No changes made to the medications.  Coronary artery disease involving native coronary artery of native heart without angina pectoris - Plan: EKG 12-Lead  Previous cardiac catheterization,nonobstructive disease Currently with no symptoms of angina. No further workup at this time. Continue current medication regimen.   PAD (peripheral artery disease) ([place]) - Plan: EKG 12-Lead Aortic atherosclerosis, coronary disease seen on previous PET scans Cholesterol is at goal on the current lipid regimen. No changes to the medications were made.  Type 2 diabetes mellitus without complication, without long-term current use of insulin (HCC) -  Recommended weight loss and walking program Hard to  walk  Pulmonary hypertension Normal RH pressures in 2018 Not taking lasix, only taking PRN, No change in status, Will monitor for now    Total encounter time more than 25 minutes  Greater than 50% was spent in counseling and  coordination of care with the patient    Signed, Ida Rogue, [place] Office [address] [place] [address], [place], [place] [address]

## 2020-08-14 ENCOUNTER — Other Ambulatory Visit: Payer: Self-pay

## 2020-08-14 ENCOUNTER — Ambulatory Visit (INDEPENDENT_AMBULATORY_CARE_PROVIDER_SITE_OTHER): Payer: Medicare Other | Admitting: Cardiovascular Disease

## 2020-08-14 ENCOUNTER — Encounter: Payer: Self-pay | Admitting: Cardiovascular Disease

## 2020-08-14 VITALS — BP 130/84 | HR 83 | Ht 68.0 in | Wt 222.0 lb

## 2020-08-14 DIAGNOSIS — I25118 Atherosclerotic heart disease of native coronary artery with other forms of angina pectoris: Secondary | ICD-10-CM | POA: Diagnosis not present

## 2020-08-14 DIAGNOSIS — E782 Mixed hyperlipidemia: Secondary | ICD-10-CM

## 2020-08-14 DIAGNOSIS — I739 Peripheral vascular disease, unspecified: Secondary | ICD-10-CM

## 2020-08-14 DIAGNOSIS — I272 Pulmonary hypertension, unspecified: Secondary | ICD-10-CM

## 2020-08-14 DIAGNOSIS — E119 Type 2 diabetes mellitus without complications: Secondary | ICD-10-CM | POA: Diagnosis not present

## 2020-08-14 DIAGNOSIS — I10 Essential (primary) hypertension: Secondary | ICD-10-CM

## 2020-08-14 MED ORDER — LISINOPRIL 40 MG PO TABS: 40.0000 mg | ORAL_TABLET | Freq: Every day | ORAL | 3 refills | Status: DC

## 2020-08-14 MED ORDER — SPIRONOLACTONE 25 MG PO TABS: 25.0000 mg | ORAL_TABLET | Freq: Every day | ORAL | 3 refills | Status: DC

## 2020-08-14 MED ORDER — FUROSEMIDE 20 MG PO TABS: 20.0000 mg | ORAL_TABLET | Freq: Every day | ORAL | 3 refills | Status: DC | PRN

## 2020-08-14 MED ORDER — POTASSIUM CHLORIDE ER 10 MEQ PO TBCR: 10.0000 meq | EXTENDED_RELEASE_TABLET | Freq: Every day | ORAL | 3 refills | Status: DC | PRN

## 2020-08-14 MED ORDER — HYDROCHLOROTHIAZIDE 25 MG PO TABS: 25.0000 mg | ORAL_TABLET | Freq: Every day | ORAL | 3 refills | Status: DC

## 2020-08-14 MED ORDER — METOPROLOL TARTRATE 25 MG PO TABS: ORAL_TABLET | ORAL | 3 refills | Status: DC

## 2020-08-14 MED ORDER — DOXAZOSIN MESYLATE 8 MG PO TABS: 8.0000 mg | ORAL_TABLET | Freq: Every day | ORAL | 3 refills | Status: DC

## 2020-08-14 MED ORDER — METOPROLOL SUCCINATE ER 50 MG PO TB24: 50.0000 mg | ORAL_TABLET | Freq: Every day | ORAL | 3 refills | Status: DC

## 2020-08-14 NOTE — Patient Instructions (Addendum)
90 day refills on cardiac meds   Medication Instructions:  No changes  If you need a refill on your cardiac medications before your next appointment, please call your pharmacy.    Lab work: CMP, lipids, uric acid   If you have labs (blood work) drawn today and your tests are completely normal, you will receive your results only by: Marland Kitchen MyChart Message (if you have MyChart) OR . A paper copy in the mail If you have any lab test that is abnormal or we need to change your treatment, we will call you to review the results.   Testing/Procedures: No new testing needed   Follow-Up: At [facility], you and your health needs are our priority.  As part of our continuing mission to provide you with exceptional heart care, we have created designated Provider Care Teams.  These Care Teams include your primary Cardiologist (physician) and Advanced Practice Providers (APPs -  Physician Assistants and Nurse Practitioners) who all work together to provide you with the care you need, when you need it.  . You will need a follow up appointment in 12 months  . Providers on your designated Care Team:   . Murray Hodgkins, NP . Christell Faith, PA-C . Marrianne Mood, PA-C  Any Other Special Instructions Will Be Listed Below (If Applicable).  COVID-19 Vaccine Information can be found at: [email or web address].uk For questions related to vaccine distribution or appointments, please email [email or web address] or call [phone].

## 2020-10-05 ENCOUNTER — Encounter: Payer: Self-pay | Admitting: Cardiovascular Disease

## 2020-10-05 NOTE — Telephone Encounter (Signed)
This encounter was created in error - please disregard.

## 2020-10-15 ENCOUNTER — Other Ambulatory Visit: Payer: Self-pay

## 2020-10-15 MED ORDER — DOXAZOSIN MESYLATE 8 MG PO TABS: 8.0000 mg | ORAL_TABLET | Freq: Every day | ORAL | 2 refills | Status: DC

## 2020-11-29 ENCOUNTER — Ambulatory Visit: Payer: Self-pay | Admitting: Orthopedic Surgery

## 2020-12-25 ENCOUNTER — Other Ambulatory Visit (HOSPITAL_COMMUNITY): Payer: Self-pay

## 2021-01-04 NOTE — Patient Instructions (Addendum)
DUE TO COVID-19 ONLY ONE VISITOR IS ALLOWED TO COME WITH YOU AND STAY IN THE WAITING ROOM ONLY DURING PRE OP AND PROCEDURE DAY OF SURGERY. THE 2 VISITORS  MAY VISIT WITH YOU AFTER SURGERY IN YOUR PRIVATE ROOM DURING VISITING HOURS ONLY!  YOU NEED TO HAVE A COVID 19 TEST ON__5/2_____ @_10 :55______, THIS TEST MUST BE DONE BEFORE SURGERY,  COVID TESTING SITE [place] [id number], IT IS ON THE RIGHT GOING OUT [address] APPROXIMATELY  2 MINUTES PAST [employer] ON THE RIGHT. ONCE YOUR COVID TEST IS COMPLETED,  PLEASE BEGIN THE QUARANTINE INSTRUCTIONS AS OUTLINED IN YOUR HANDOUT.                [name]   Your procedure is scheduled on: 01/09/21   Report to [facility] Main  Entrance   Report to admitting at  6:00 AM     Call this number if you have problems the morning of surgery [phone]    . BRUSH YOUR TEETH MORNING OF SURGERY AND RINSE YOUR MOUTH OUT, NO CHEWING GUM CANDY OR MINTS.   No food after midnight.    You may have clear liquid until 5:30 AM.     Nothing by mouth after 5:30 AM.   Take these medicines the morning of surgery with A SIP OF WATER: Metoprolol, Allopurinol, Pantoprazole, oxybutynin  DO NOT TAKE ANY DIABETIC MEDICATIONS DAY OF YOUR SURGERY                               You may not have any metal on your body including              piercings  Do not wear jewelry,  lotions, powders or deodorant              Men may shave face and neck.   Do not bring valuables to the hospital. [place].  Contacts, dentures or bridgework may not be worn into surgery.               [place] - Preparing for Surgery Before surgery, you can play an important role.  Because skin is not sterile, your skin needs to be as free of germs as possible.  You can reduce the number of germs on your skin by washing with CHG (chlorahexidine gluconate) soap before surgery.  CHG is an  antiseptic cleaner which kills germs and bonds with the skin to continue killing germs even after washing. Please DO NOT use if you have an allergy to CHG or antibacterial soaps.  If your skin becomes reddened/irritated stop using the CHG and inform your nurse when you arrive at Short Stay. .  You may shave your face/neck.  Please follow these instructions carefully:  1.  Shower with CHG Soap the night before surgery and the  morning of Surgery.  2.  If you choose to wash your hair, wash your hair first as usual with your  normal  shampoo.  3.  After you shampoo, rinse your hair and body thoroughly to remove the  shampoo.  4.  Use CHG as you would any other liquid soap.  You can apply chg directly  to the skin and wash                       Gently with a scrungie or clean washcloth.  5.  Apply the CHG Soap to your body ONLY FROM THE NECK DOWN.   Do not use on face/ open                           Wound or open sores. Avoid contact with eyes, ears mouth and genitals (private parts).                       Wash face,  Genitals (private parts) with your normal soap.             6.  Wash thoroughly, paying special attention to the area where your surgery  will be performed.  7.  Thoroughly rinse your body with warm water from the neck down.  8.  DO NOT shower/wash with your normal soap after using and rinsing off  the CHG Soap.             9.  Pat yourself dry with a clean towel.            10.  Wear clean pajamas.            11.  Place clean sheets on your bed the night of your first shower and do not  sleep with pets. Day of Surgery : Do not apply any lotions/deodorants the morning of surgery.  Please wear clean clothes to the hospital/surgery center.  FAILURE TO FOLLOW THESE INSTRUCTIONS MAY RESULT IN THE CANCELLATION OF YOUR SURGERY PATIENT SIGNATURE_________________________________  NURSE  SIGNATURE__________________________________  ________________________________________________________________________   Adam Phenix  An incentive spirometer is a tool that can help keep your lungs clear and active. This tool measures how well you are filling your lungs with each breath. Taking long deep breaths may help reverse or decrease the chance of developing breathing (pulmonary) problems (especially infection) following:  A long period of time when you are unable to move or be active. BEFORE THE PROCEDURE   If the spirometer includes an indicator to show your best effort, your nurse or respiratory therapist will set it to a desired goal.  If possible, sit up straight or lean slightly forward. Try not to slouch.  Hold the incentive spirometer in an upright position. INSTRUCTIONS FOR USE  1. Sit on the edge of your bed if possible, or sit up as far as you can in bed or on a chair. 2. Hold the incentive spirometer in an upright position. 3. Breathe out normally. 4. Place the mouthpiece in your mouth and seal your lips tightly around it. 5. Breathe in slowly and as deeply as possible, raising the piston or the ball toward the top of the column. 6. Hold your breath for 3-5 seconds or for as long as possible. Allow the piston or ball to fall to the bottom of the column. 7. Remove the mouthpiece from your mouth and breathe out normally. 8. Rest for a few seconds and repeat Steps 1 through 7 at least 10 times every 1-2 hours when you are awake. Take your time and take a few normal breaths between deep breaths. 9. The spirometer may include an indicator to show your best effort.  Use the indicator as a goal to work toward during each repetition. 10. After each set of 10 deep breaths, practice coughing to be sure your lungs are clear. If you have an incision (the cut made at the time of surgery), support your incision when coughing by placing a pillow or rolled up towels firmly  against it. Once you are able to get out of bed, walk around indoors and cough well. You may stop using the incentive spirometer when instructed by your caregiver.  RISKS AND COMPLICATIONS  Take your time so you do not get dizzy or light-headed.  If you are in pain, you may need to take or ask for pain medication before doing incentive spirometry. It is harder to take a deep breath if you are having pain. AFTER USE  Rest and breathe slowly and easily.  It can be helpful to keep track of a log of your progress. Your caregiver can provide you with a simple table to help with this. If you are using the spirometer at home, follow these instructions: [place] IF:   You are having difficultly using the spirometer.  You have trouble using the spirometer as often as instructed.  Your pain medication is not giving enough relief while using the spirometer.  You develop fever of 100.5 F (38.1 C) or higher. SEEK IMMEDIATE MEDICAL CARE IF:   You cough up bloody sputum that had not been present before.  You develop fever of 102 F (38.9 C) or greater.  You develop worsening pain at or near the incision site. MAKE SURE YOU:   Understand these instructions.  Will watch your condition.  Will get help right away if you are not doing well or get worse. Document Released: 01/05/2007 Document Revised: 11/17/2011 Document Reviewed: 03/08/2007 [facility] Patient Information 2014 [place], [place].   ________________________________________________________________________

## 2021-01-07 ENCOUNTER — Encounter (HOSPITAL_COMMUNITY)
Admission: RE | Admit: 2021-01-07 | Discharge: 2021-01-07 | Disposition: A | Discharge status: Home / Self Care | Payer: Medicare Other | Source: Ambulatory Visit | Attending: Specialist | Admitting: Specialist

## 2021-01-07 ENCOUNTER — Other Ambulatory Visit: Payer: Self-pay

## 2021-01-07 ENCOUNTER — Other Ambulatory Visit (HOSPITAL_COMMUNITY)
Admission: RE | Admit: 2021-01-07 | Discharge: 2021-01-07 | Disposition: A | Discharge status: Home / Self Care | Payer: Medicare Other | Source: Ambulatory Visit | Attending: Specialist | Admitting: Specialist

## 2021-01-07 ENCOUNTER — Encounter (HOSPITAL_COMMUNITY): Payer: Self-pay

## 2021-01-07 DIAGNOSIS — Z01812 Encounter for preprocedural laboratory examination: Secondary | ICD-10-CM | POA: Diagnosis present

## 2021-01-07 DIAGNOSIS — Z20822 Contact with and (suspected) exposure to covid-19: Secondary | ICD-10-CM | POA: Insufficient documentation

## 2021-01-07 LAB — BASIC METABOLIC PANEL
Anion gap: 9 (ref 5–15)
BUN: 35 mg/dL — ABNORMAL HIGH (ref 8–23)
CO2: 25 mmol/L (ref 22–32)
Calcium: 10.2 mg/dL (ref 8.9–10.3)
Chloride: 104 mmol/L (ref 98–111)
Creatinine, Ser: 1.39 mg/dL — ABNORMAL HIGH (ref 0.61–1.24)
GFR, Estimated: 53 mL/min — ABNORMAL LOW (ref >60)
Glucose, Bld: 106 mg/dL — ABNORMAL HIGH (ref 70–99)
Potassium: 4.3 mmol/L (ref 3.5–5.1)
Sodium: 138 mmol/L (ref 135–145)

## 2021-01-07 LAB — CBC
HCT: 45.4 % (ref 39.0–52.0)
Hemoglobin: 15.1 g/dL (ref 13.0–17.0)
MCH: 31.9 pg (ref 26.0–34.0)
MCHC: 33.3 g/dL (ref 30.0–36.0)
MCV: 95.8 fL (ref 80.0–100.0)
Platelets: 167 10*3/uL (ref 150–400)
RBC: 4.74 MIL/uL (ref 4.22–5.81)
RDW: 13.5 % (ref 11.5–15.5)
WBC: 5.6 10*3/uL (ref 4.0–10.5)
nRBC: 0 % (ref 0.0–0.2)

## 2021-01-07 LAB — URINALYSIS, ROUTINE W REFLEX MICROSCOPIC
Bacteria, UA: NONE SEEN
Bilirubin Urine: NEGATIVE
Glucose, UA: NEGATIVE mg/dL
Hgb urine dipstick: NEGATIVE
Ketones, ur: NEGATIVE mg/dL
Leukocytes,Ua: NEGATIVE
Nitrite: NEGATIVE
Protein, ur: 100 mg/dL — AB
Specific Gravity, Urine: 1.016 (ref 1.005–1.030)
pH: 5 (ref 5.0–8.0)

## 2021-01-07 LAB — PROTIME-INR
INR: 1.1 (ref 0.8–1.2)
Prothrombin Time: 14.1 seconds (ref 11.4–15.2)

## 2021-01-07 LAB — SURGICAL PCR SCREEN
MRSA, PCR: POSITIVE — AB
Staphylococcus aureus: POSITIVE — AB

## 2021-01-07 LAB — GLUCOSE, CAPILLARY: Glucose-Capillary: 107 mg/dL — ABNORMAL HIGH (ref 70–99)

## 2021-01-07 LAB — APTT: aPTT: 33 seconds (ref 24–36)

## 2021-01-07 NOTE — Progress Notes (Signed)
COVID Vaccine Completed:Yes Date COVID Vaccine completed:12/11/19-booster 07/29/20 COVID vaccine manufacturer:    Moderna   PCP - Octavio Manns Pulmonologist- Dr. Monte Fantasia, [place] Cardiologist - Dr. Conni Slipper  Chest x-ray - no EKG - 08/14/20-epic Stress Test - no ECHO - 09/23/16-epic Cardiac Cath - 01/02/17-epic Pacemaker/ICD device last checked: NA  Sleep Study - yes CPAP - yes with O2  Fasting Blood Sugar - 98-120 Checks Blood Sugar _____ times a day. 2-3 times a week  Blood Thinner Instructions:NA Aspirin Instructions: Last Dose:  Anesthesia review:   Patient denies shortness of breath, fever, cough and chest pain at PAT appointment Yes. Pt reports no SOB with any activities  Patient verbalized understanding of instructions that were given to them at the PAT appointment. Patient was also instructed that they will need to review over the PAT instructions again at home before surgery.Yes

## 2021-01-08 LAB — SARS CORONAVIRUS 2 (TAT 6-24 HRS): SARS Coronavirus 2: NEGATIVE

## 2021-01-08 MED ORDER — VANCOMYCIN HCL 1500 MG/300ML IV SOLN
1500.0000 mg | INTRAVENOUS | Status: AC
  Filled 2021-01-08: qty 300

## 2021-01-08 MED ORDER — GENTAMICIN SULFATE 40 MG/ML IJ SOLN
5.0000 mg/kg | INTRAVENOUS | Status: AC
  Filled 2021-01-08: qty 10.25

## 2021-01-11 NOTE — Progress Notes (Signed)
Spoke with [name], he is aware to arrive at 6AM 01/16/2021, COVID test 01/14/21 at 11:10AM. Reviewed pre op instructions. [name] verbalized understanding.

## 2021-01-14 ENCOUNTER — Other Ambulatory Visit (HOSPITAL_COMMUNITY): Payer: Self-pay

## 2021-01-14 ENCOUNTER — Other Ambulatory Visit (HOSPITAL_COMMUNITY)
Admission: RE | Admit: 2021-01-14 | Discharge: 2021-01-14 | Disposition: A | Discharge status: Home / Self Care | Payer: Medicare Other | Source: Ambulatory Visit | Attending: Specialist | Admitting: Specialist

## 2021-01-14 DIAGNOSIS — Z01812 Encounter for preprocedural laboratory examination: Secondary | ICD-10-CM | POA: Insufficient documentation

## 2021-01-14 DIAGNOSIS — Z20822 Contact with and (suspected) exposure to covid-19: Secondary | ICD-10-CM | POA: Insufficient documentation

## 2021-01-14 LAB — SARS CORONAVIRUS 2 (TAT 6-24 HRS): SARS Coronavirus 2: NEGATIVE

## 2021-01-15 ENCOUNTER — Ambulatory Visit: Payer: Self-pay | Admitting: Orthopedic Surgery

## 2021-01-15 MED ORDER — VANCOMYCIN HCL 1500 MG/300ML IV SOLN
1500.0000 mg | INTRAVENOUS | Status: AC
  Administered 2021-01-16: 1500 mg via INTRAVENOUS
  Filled 2021-01-15: qty 300

## 2021-01-15 NOTE — H&P (Signed)
[name] is an 75 y.o. male.   Chief Complaint: left knee pain HPI: Dr. [name] and the patient mutually agreed to proceed with a total knee replacement. Risks and benefits of the procedure were discussed including stiffness, suboptimal range of motion, persistent pain, infection requiring removal of prosthesis and reinsertion, need for prophylactic antibiotics in the future, for example, dental procedures, possible need for manipulation, revision in the future and also anesthetic complications including DVT, PE, etc. We discussed the perioperative course, time in the hospital, postoperative recovery and the need for elevation to control swelling. We also discussed the predicted range of motion and the probability that squatting and kneeling would be unobtainable in the future. In addition, postoperative anticoagulation was discussed. We have obtained preoperative medical clearance as necessary. Provided illustrated handout and discussed it in detail. They will enroll in the total joint replacement educational forum at the hospital.   He would like to go to rehab postoperatively, his son and daughter-in-law work at a local rehab facility.  Past Medical History:  Diagnosis Date  . 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 Diabetes mellitus without complication ([place])   . GERD (gastroesophageal reflux disease)   . Hx of lymphoma   . Hyperlipidemia   . Hypertension   . Hypotestosteronemia   . Sleep apnea 2013   wears CPAP    Past Surgical History:  Procedure Laterality Date  . CARDIAC CATHETERIZATION     Dr. Sabra Heck  . RIGHT/LEFT HEART CATH AND CORONARY ANGIOGRAPHY N/A 01/02/2017   Procedure: Right/Left Heart Cath and Coronary Angiography;  Surgeon: Jolaine Artist, MD;  Location: [facility];  Service: Cardiovascular;  Laterality: N/A;    Family History  Problem Relation Age of Onset  . Heart disease Father   . Heart attack Father        x3  . Colon cancer Neg Hx   . Rectal  cancer Neg Hx   . Stomach cancer Neg Hx   . Esophageal cancer Neg Hx    Social History:  reports that he has never smoked. He has never used smokeless tobacco. He reports that he does not drink alcohol and does not use drugs.  Allergies:  Allergies  Allergen Reactions  . Penicillins Hives and Other (See Comments)    "cillins" Has patient had a PCN reaction causing immediate rash, facial/tongue/throat swelling, SOB or lightheadedness with hypotension: unknown Has patient had a PCN reaction causing severe rash involving mucus membranes or skin necrosis: unknown Has patient had a PCN reaction that required hospitalization no Has patient had a PCN reaction occurring within the last 10 years:no If all of the above answers are "NO", then may proceed with Cephalosporin use.    Current Medications allopurinoL 100 mg tablet atorvastatin 20 mg tablet azelastine 137 mcg (0.1 %) nasal spray aerosol doxazosin 8 mg tablet furosemide 20 mg tablet hydroCHLOROthiazide 25 mg tablet lisinopriL 40 mg tablet metFORMIN ER 500 mg tablet,extended release 24 hr metoprolol succinate ER 50 mg tablet,extended release 24 hr metoprolol tartrate 25 mg tablet OneTouch Verio test strips oxybutynin 3.9 mg/24 hour transdermal 4 day patch oxybutynin chloride 5 mg tablet pantoprazole 40 mg tablet,delayed release PEG-3350/KCL SOL /SODIUM potassium chloride ER 10 mEq tablet,extended release spironolactone 25 mg tablet sulfamethoxazole 800 mg-trimethoprim 160 mg tablet testosterone cypionate 200 mg/mL intramuscular oil   Results for orders placed or performed during the hospital encounter of 01/14/21 (from the past 48 hour(s))  SARS CORONAVIRUS 2 (TAT 6-24 HRS)  Nasopharyngeal Nasopharyngeal Swab     Status: None   Collection Time: 01/14/21  9:20 AM   Specimen: Nasopharyngeal Swab  Result Value Ref Range   SARS Coronavirus 2 NEGATIVE NEGATIVE    Comment: (NOTE) SARS-CoV-2 target nucleic acids are NOT  DETECTED.  The SARS-CoV-2 RNA is generally detectable in upper and lower respiratory specimens during the acute phase of infection. Negative results do not preclude SARS-CoV-2 infection, do not rule out co-infections with other pathogens, and should not be used as the sole basis for treatment or other patient management decisions. Negative results must be combined with clinical observations, patient history, and epidemiological information. The expected result is Negative.  Fact Sheet for Patients: SugarRoll.be  Fact Sheet for Healthcare Providers: https://www.woods-mathews.com/  This test is not yet approved or cleared by the [place] FDA and  has been authorized for detection and/or diagnosis of SARS-CoV-2 by FDA under an Emergency Use Authorization (EUA). This EUA will remain  in effect (meaning this test can be used) for the duration of the COVID-19 declaration under Se ction 564(b)(1) of the Act, 21 U.S.C. section 360bbb-3(b)(1), unless the authorization is terminated or revoked sooner.  Performed at [facility], [place] [address], [place], [place] [address]    No results found.  Review of Systems  Constitutional: Negative.   HENT: Negative.   Eyes: Negative.   Respiratory: Negative.   Cardiovascular: Negative.   Gastrointestinal: Negative.   Endocrine: Negative.   Genitourinary: Negative.   Musculoskeletal: Positive for arthralgias and joint swelling.  Skin: Negative.   Neurological: Negative.   All other systems reviewed and are negative.   There were no vitals taken for this visit. Physical Exam Constitutional:      Appearance: Normal appearance.  HENT:     Head: Normocephalic.     Right Ear: External ear normal.     Left Ear: External ear normal.     Nose: Nose normal.     Mouth/Throat:     Mouth: Mucous membranes are moist.  Eyes:     Pupils: Pupils are equal, round, and reactive to light.   Cardiovascular:     Rate and Rhythm: Normal rate and regular rhythm.     Pulses: Normal pulses.     Heart sounds: Normal heart sounds.  Pulmonary:     Effort: Pulmonary effort is normal.  Abdominal:     General: Bowel sounds are normal.  Musculoskeletal:     Cervical back: Normal range of motion.     Comments: Tender in the medial joint line. Patellofemoral pain compression.  No DVT. Straight leg raise is negative.  Skin:    General: Skin is warm and dry.  Neurological:     Mental Status: He is alert.      Assessment/Plan Impression: Left knee end-stage osteoarthritis  Plan:Pt with end-stage left knee DJD, bone-on-bone, refractory to conservative tx, scheduled for left total knee replacement by Dr. [name] on 5-4. We again discussed the procedure itself as well as risks, complications and alternatives, including but not limited to DVT, PE, infx, bleeding, failure of procedure, need for secondary procedure including manipulation, nerve injury, ongoing pain/symptoms, anesthesia risk, even stroke or death. Also discussed typical post-op protocols, activity restrictions, need for PT, flexion/extension exercises, time out of work. Discussed need for DVT ppx post-op per protocol. Discussed dental ppx and infx prevention. Also discussed limitations post-operatively such as kneeling and squatting. All questions were answered. Patient desires to proceed with surgery as scheduled.  Will  hold supplements, ASA and NSAIDs accordingly. Will remain NPO after midnight the night before surgery. [facility] to [facility] pre-op testing. Anticipate hospital stay to include at least 2 midnights given medical history and to ensure proper pain control. Plan aspirin for DVT ppx post-op. Plan oxycodone, Colace, Miralax. Plan Short stay at acute rehab then returned home to outpatient PT. Will follow up 10-14 days post-op for staple removal and xrays.   Plan LEFT total knee replacement  Cecilie Kicks, PA-C for Dr.  [name] 01/15/2021, 10:20 PM

## 2021-01-15 NOTE — Anesthesia Preprocedure Evaluation (Addendum)
Anesthesia Evaluation  Patient identified by MRN, date of birth, ID band Patient awake    Reviewed: Allergy & Precautions, NPO status , Patient's Chart, lab work & pertinent test results  History of Anesthesia Complications Negative for: history of anesthetic complications  Airway Mallampati: II  TM Distance: >3 FB Neck ROM: Full    Dental no notable dental hx. (+) Dental Advisory Given   Pulmonary sleep apnea and Continuous Positive Airway Pressure Ventilation ,    Pulmonary exam normal        Cardiovascular hypertension, Pt. on medications and Pt. on home beta blockers + CAD  Normal cardiovascular exam  Assessment: 1. Minimal coronary plaque 2. Normal LV function EF 55-60% 3. Very mildly elevated pulmonary pressures (much lower than echo) 4. 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   Neuro/Psych negative neurological ROS     GI/Hepatic Neg liver ROS, GERD  ,  Endo/Other  diabetesMorbid obesity  Renal/GU negative Renal ROS     Musculoskeletal negative musculoskeletal ROS (+)   Abdominal   Peds  Hematology negative hematology ROS (+)   Anesthesia Other Findings   Reproductive/Obstetrics                            Anesthesia Physical Anesthesia Plan  ASA: III  Anesthesia Plan: MAC and Spinal   Post-op Pain Management:  Regional for Post-op pain   Induction:   PONV Risk Score and Plan: Ondansetron and Propofol infusion  Airway Management Planned: Natural Airway  Additional Equipment:   Intra-op Plan:   Post-operative Plan:   Informed Consent: I have reviewed the patients History and Physical, chart, labs and discussed the procedure including the risks, benefits and alternatives for  the proposed anesthesia with the patient or authorized representative who has indicated his/her understanding and acceptance.     Dental advisory given  Plan Discussed with: Anesthesiologist and CRNA  Anesthesia Plan Comments:        Anesthesia Quick Evaluation

## 2021-01-15 NOTE — H&P (View-Only) (Signed)
[name] is an 74 y.o. male.   Chief Complaint: left knee pain HPI: Dr. Beane and the patient mutually agreed to proceed with a total knee replacement. Risks and benefits of the procedure were discussed including stiffness, suboptimal range of motion, persistent pain, infection requiring removal of prosthesis and reinsertion, need for prophylactic antibiotics in the future, for example, dental procedures, possible need for manipulation, revision in the future and also anesthetic complications including DVT, PE, etc. We discussed the perioperative course, time in the hospital, postoperative recovery and the need for elevation to control swelling. We also discussed the predicted range of motion and the probability that squatting and kneeling would be unobtainable in the future. In addition, postoperative anticoagulation was discussed. We have obtained preoperative medical clearance as necessary. Provided illustrated handout and discussed it in detail. They will enroll in the total joint replacement educational forum at the hospital.   He would like to go to rehab postoperatively, his son and daughter-in-law work at a local rehab facility.  Past Medical History:  Diagnosis Date  . Cancer (HCC) 2011   lymphoma L leg  . Diabetes mellitus without complication (HCC)   . GERD (gastroesophageal reflux disease)   . Hx of lymphoma   . Hyperlipidemia   . Hypertension   . Hypotestosteronemia   . Sleep apnea 2013   wears CPAP    Past Surgical History:  Procedure Laterality Date  . CARDIAC CATHETERIZATION     Dr. Miller  . RIGHT/LEFT HEART CATH AND CORONARY ANGIOGRAPHY N/A 01/02/2017   Procedure: Right/Left Heart Cath and Coronary Angiography;  Surgeon: Daniel R Bensimhon, MD;  Location: [facility];  Service: Cardiovascular;  Laterality: N/A;    Family History  Problem Relation Age of Onset  . Heart disease Father   . Heart attack Father        x3  . Colon cancer Neg Hx   . Rectal  cancer Neg Hx   . Stomach cancer Neg Hx   . Esophageal cancer Neg Hx    Social History:  reports that he has never smoked. He has never used smokeless tobacco. He reports that he does not drink alcohol and does not use drugs.  Allergies:  Allergies  Allergen Reactions  . Penicillins Hives and Other (See Comments)    "cillins" Has patient had a PCN reaction causing immediate rash, facial/tongue/throat swelling, SOB or lightheadedness with hypotension: unknown Has patient had a PCN reaction causing severe rash involving mucus membranes or skin necrosis: unknown Has patient had a PCN reaction that required hospitalization no Has patient had a PCN reaction occurring within the last 10 years:no If all of the above answers are "NO", then may proceed with Cephalosporin use.    Current Medications allopurinoL 100 mg tablet atorvastatin 20 mg tablet azelastine 137 mcg (0.1 %) nasal spray aerosol doxazosin 8 mg tablet furosemide 20 mg tablet hydroCHLOROthiazide 25 mg tablet lisinopriL 40 mg tablet metFORMIN ER 500 mg tablet,extended release 24 hr metoprolol succinate ER 50 mg tablet,extended release 24 hr metoprolol tartrate 25 mg tablet OneTouch Verio test strips oxybutynin 3.9 mg/24 hour transdermal 4 day patch oxybutynin chloride 5 mg tablet pantoprazole 40 mg tablet,delayed release PEG-3350/KCL SOL /SODIUM potassium chloride ER 10 mEq tablet,extended release spironolactone 25 mg tablet sulfamethoxazole 800 mg-trimethoprim 160 mg tablet testosterone cypionate 200 mg/mL intramuscular oil   Results for orders placed or performed during the hospital encounter of 01/14/21 (from the past 48 hour(s))  SARS CORONAVIRUS 2 (TAT 6-24 HRS)   Nasopharyngeal Nasopharyngeal Swab     Status: None   Collection Time: 01/14/21  9:20 AM   Specimen: Nasopharyngeal Swab  Result Value Ref Range   SARS Coronavirus 2 NEGATIVE NEGATIVE    Comment: (NOTE) SARS-CoV-2 target nucleic acids are NOT  DETECTED.  The SARS-CoV-2 RNA is generally detectable in upper and lower respiratory specimens during the acute phase of infection. Negative results do not preclude SARS-CoV-2 infection, do not rule out co-infections with other pathogens, and should not be used as the sole basis for treatment or other patient management decisions. Negative results must be combined with clinical observations, patient history, and epidemiological information. The expected result is Negative.  Fact Sheet for Patients: SugarRoll.be  Fact Sheet for Healthcare Providers: https://www.woods-mathews.com/  This test is not yet approved or cleared by the [place] FDA and  has been authorized for detection and/or diagnosis of SARS-CoV-2 by FDA under an Emergency Use Authorization (EUA). This EUA will remain  in effect (meaning this test can be used) for the duration of the COVID-19 declaration under Se ction 564(b)(1) of the Act, 21 U.S.C. section 360bbb-3(b)(1), unless the authorization is terminated or revoked sooner.  Performed at [facility], [place] [address], [place], [place] [address]    No results found.  Review of Systems  Constitutional: Negative.   HENT: Negative.   Eyes: Negative.   Respiratory: Negative.   Cardiovascular: Negative.   Gastrointestinal: Negative.   Endocrine: Negative.   Genitourinary: Negative.   Musculoskeletal: Positive for arthralgias and joint swelling.  Skin: Negative.   Neurological: Negative.   All other systems reviewed and are negative.   There were no vitals taken for this visit. Physical Exam Constitutional:      Appearance: Normal appearance.  HENT:     Head: Normocephalic.     Right Ear: External ear normal.     Left Ear: External ear normal.     Nose: Nose normal.     Mouth/Throat:     Mouth: Mucous membranes are moist.  Eyes:     Pupils: Pupils are equal, round, and reactive to light.   Cardiovascular:     Rate and Rhythm: Normal rate and regular rhythm.     Pulses: Normal pulses.     Heart sounds: Normal heart sounds.  Pulmonary:     Effort: Pulmonary effort is normal.  Abdominal:     General: Bowel sounds are normal.  Musculoskeletal:     Cervical back: Normal range of motion.     Comments: Tender in the medial joint line. Patellofemoral pain compression.  No DVT. Straight leg raise is negative.  Skin:    General: Skin is warm and dry.  Neurological:     Mental Status: He is alert.      Assessment/Plan Impression: Left knee end-stage osteoarthritis  Plan:Pt with end-stage left knee DJD, bone-on-bone, refractory to conservative tx, scheduled for left total knee replacement by Dr. [name] on 5-4. We again discussed the procedure itself as well as risks, complications and alternatives, including but not limited to DVT, PE, infx, bleeding, failure of procedure, need for secondary procedure including manipulation, nerve injury, ongoing pain/symptoms, anesthesia risk, even stroke or death. Also discussed typical post-op protocols, activity restrictions, need for PT, flexion/extension exercises, time out of work. Discussed need for DVT ppx post-op per protocol. Discussed dental ppx and infx prevention. Also discussed limitations post-operatively such as kneeling and squatting. All questions were answered. Patient desires to proceed with surgery as scheduled.  Will  hold supplements, ASA and NSAIDs accordingly. Will remain NPO after midnight the night before surgery. Will present to [facility] for pre-op testing. Anticipate hospital stay to include at least 2 midnights given medical history and to ensure proper pain control. Plan aspirin for DVT ppx post-op. Plan oxycodone, Colace, Miralax. Plan Short stay at acute rehab then returned home to outpatient PT. Will follow up 10-14 days post-op for staple removal and xrays.   Plan LEFT total knee replacement  Jimma Ortman M Maryln Eastham, PA-C for Dr.  Beane 01/15/2021, 10:20 PM   

## 2021-01-16 ENCOUNTER — Inpatient Hospital Stay (HOSPITAL_COMMUNITY): Payer: Medicare Other | Admitting: Physician Assistant

## 2021-01-16 ENCOUNTER — Inpatient Hospital Stay (HOSPITAL_COMMUNITY)
Admission: RE | Admit: 2021-01-16 | Discharge: 2021-02-06 | DRG: 469 | Disposition: A | Discharge status: Rehabilitation Facility | Payer: Medicare Other | Attending: Internal Medicine | Admitting: Internal Medicine

## 2021-01-16 ENCOUNTER — Other Ambulatory Visit: Payer: Self-pay

## 2021-01-16 ENCOUNTER — Encounter (HOSPITAL_COMMUNITY): Payer: Self-pay | Admitting: Specialist

## 2021-01-16 ENCOUNTER — Inpatient Hospital Stay (HOSPITAL_COMMUNITY): Payer: Medicare Other

## 2021-01-16 ENCOUNTER — Inpatient Hospital Stay (HOSPITAL_COMMUNITY): Payer: Medicare Other | Admitting: Anesthesiology

## 2021-01-16 ENCOUNTER — Encounter (HOSPITAL_COMMUNITY)
Admission: RE | Disposition: A | Discharge status: Other Acute Inpatient Hospital | Payer: Self-pay | Source: Home / Self Care | Attending: Specialist

## 2021-01-16 DIAGNOSIS — M879 Osteonecrosis, unspecified: Secondary | ICD-10-CM | POA: Diagnosis present

## 2021-01-16 DIAGNOSIS — E669 Obesity, unspecified: Secondary | ICD-10-CM | POA: Diagnosis present

## 2021-01-16 DIAGNOSIS — Z0189 Encounter for other specified special examinations: Secondary | ICD-10-CM

## 2021-01-16 DIAGNOSIS — B3781 Candidal esophagitis: Secondary | ICD-10-CM | POA: Diagnosis not present

## 2021-01-16 DIAGNOSIS — Z20822 Contact with and (suspected) exposure to covid-19: Secondary | ICD-10-CM | POA: Diagnosis present

## 2021-01-16 DIAGNOSIS — G9341 Metabolic encephalopathy: Secondary | ICD-10-CM | POA: Diagnosis not present

## 2021-01-16 DIAGNOSIS — Q263 Partial anomalous pulmonary venous connection: Secondary | ICD-10-CM

## 2021-01-16 DIAGNOSIS — D62 Acute posthemorrhagic anemia: Secondary | ICD-10-CM

## 2021-01-16 DIAGNOSIS — G928 Other toxic encephalopathy: Secondary | ICD-10-CM | POA: Diagnosis not present

## 2021-01-16 DIAGNOSIS — G4710 Hypersomnia, unspecified: Secondary | ICD-10-CM | POA: Diagnosis not present

## 2021-01-16 DIAGNOSIS — I130 Hypertensive heart and chronic kidney disease with heart failure and stage 1 through stage 4 chronic kidney disease, or unspecified chronic kidney disease: Secondary | ICD-10-CM | POA: Diagnosis present

## 2021-01-16 DIAGNOSIS — M25762 Osteophyte, left knee: Secondary | ICD-10-CM | POA: Diagnosis present

## 2021-01-16 DIAGNOSIS — K567 Ileus, unspecified: Secondary | ICD-10-CM | POA: Diagnosis not present

## 2021-01-16 DIAGNOSIS — K580 Irritable bowel syndrome with diarrhea: Secondary | ICD-10-CM

## 2021-01-16 DIAGNOSIS — E785 Hyperlipidemia, unspecified: Secondary | ICD-10-CM | POA: Diagnosis present

## 2021-01-16 DIAGNOSIS — Z79899 Other long term (current) drug therapy: Secondary | ICD-10-CM

## 2021-01-16 DIAGNOSIS — K56609 Unspecified intestinal obstruction, unspecified as to partial versus complete obstruction: Secondary | ICD-10-CM

## 2021-01-16 DIAGNOSIS — K9189 Other postprocedural complications and disorders of digestive system: Secondary | ICD-10-CM

## 2021-01-16 DIAGNOSIS — R140 Abdominal distension (gaseous): Secondary | ICD-10-CM

## 2021-01-16 DIAGNOSIS — E1122 Type 2 diabetes mellitus with diabetic chronic kidney disease: Secondary | ICD-10-CM | POA: Diagnosis present

## 2021-01-16 DIAGNOSIS — N1831 Chronic kidney disease, stage 3a: Secondary | ICD-10-CM | POA: Diagnosis present

## 2021-01-16 DIAGNOSIS — Z96652 Presence of left artificial knee joint: Secondary | ICD-10-CM | POA: Diagnosis not present

## 2021-01-16 DIAGNOSIS — Z9981 Dependence on supplemental oxygen: Secondary | ICD-10-CM

## 2021-01-16 DIAGNOSIS — I9589 Other hypotension: Secondary | ICD-10-CM | POA: Diagnosis not present

## 2021-01-16 DIAGNOSIS — R0603 Acute respiratory distress: Secondary | ICD-10-CM | POA: Diagnosis not present

## 2021-01-16 DIAGNOSIS — Z8616 Personal history of COVID-19: Secondary | ICD-10-CM

## 2021-01-16 DIAGNOSIS — E1165 Type 2 diabetes mellitus with hyperglycemia: Secondary | ICD-10-CM | POA: Diagnosis present

## 2021-01-16 DIAGNOSIS — N179 Acute kidney failure, unspecified: Secondary | ICD-10-CM | POA: Diagnosis not present

## 2021-01-16 DIAGNOSIS — E1151 Type 2 diabetes mellitus with diabetic peripheral angiopathy without gangrene: Secondary | ICD-10-CM | POA: Diagnosis present

## 2021-01-16 DIAGNOSIS — N400 Enlarged prostate without lower urinary tract symptoms: Secondary | ICD-10-CM | POA: Diagnosis not present

## 2021-01-16 DIAGNOSIS — I5033 Acute on chronic diastolic (congestive) heart failure: Secondary | ICD-10-CM | POA: Diagnosis not present

## 2021-01-16 DIAGNOSIS — K5900 Constipation, unspecified: Secondary | ICD-10-CM

## 2021-01-16 DIAGNOSIS — E871 Hypo-osmolality and hyponatremia: Secondary | ICD-10-CM | POA: Diagnosis not present

## 2021-01-16 DIAGNOSIS — E119 Type 2 diabetes mellitus without complications: Secondary | ICD-10-CM

## 2021-01-16 DIAGNOSIS — R571 Hypovolemic shock: Secondary | ICD-10-CM | POA: Diagnosis not present

## 2021-01-16 DIAGNOSIS — E1169 Type 2 diabetes mellitus with other specified complication: Secondary | ICD-10-CM | POA: Diagnosis present

## 2021-01-16 DIAGNOSIS — Z452 Encounter for adjustment and management of vascular access device: Secondary | ICD-10-CM

## 2021-01-16 DIAGNOSIS — R609 Edema, unspecified: Secondary | ICD-10-CM | POA: Diagnosis not present

## 2021-01-16 DIAGNOSIS — I4891 Unspecified atrial fibrillation: Secondary | ICD-10-CM

## 2021-01-16 DIAGNOSIS — K56600 Partial intestinal obstruction, unspecified as to cause: Secondary | ICD-10-CM | POA: Diagnosis not present

## 2021-01-16 DIAGNOSIS — K922 Gastrointestinal hemorrhage, unspecified: Secondary | ICD-10-CM | POA: Diagnosis not present

## 2021-01-16 DIAGNOSIS — Z880 Allergy status to penicillin: Secondary | ICD-10-CM

## 2021-01-16 DIAGNOSIS — T50915A Adverse effect of multiple unspecified drugs, medicaments and biological substances, initial encounter: Secondary | ICD-10-CM | POA: Diagnosis present

## 2021-01-16 DIAGNOSIS — I10 Essential (primary) hypertension: Secondary | ICD-10-CM | POA: Diagnosis present

## 2021-01-16 DIAGNOSIS — I959 Hypotension, unspecified: Secondary | ICD-10-CM

## 2021-01-16 DIAGNOSIS — D500 Iron deficiency anemia secondary to blood loss (chronic): Secondary | ICD-10-CM | POA: Diagnosis not present

## 2021-01-16 DIAGNOSIS — E874 Mixed disorder of acid-base balance: Secondary | ICD-10-CM | POA: Diagnosis not present

## 2021-01-16 DIAGNOSIS — Z8572 Personal history of non-Hodgkin lymphomas: Secondary | ICD-10-CM

## 2021-01-16 DIAGNOSIS — K2289 Other specified disease of esophagus: Secondary | ICD-10-CM | POA: Diagnosis not present

## 2021-01-16 DIAGNOSIS — R579 Shock, unspecified: Secondary | ICD-10-CM | POA: Diagnosis not present

## 2021-01-16 DIAGNOSIS — Z7982 Long term (current) use of aspirin: Secondary | ICD-10-CM

## 2021-01-16 DIAGNOSIS — I272 Pulmonary hypertension, unspecified: Secondary | ICD-10-CM | POA: Diagnosis present

## 2021-01-16 DIAGNOSIS — J9601 Acute respiratory failure with hypoxia: Secondary | ICD-10-CM | POA: Diagnosis not present

## 2021-01-16 DIAGNOSIS — Z96659 Presence of unspecified artificial knee joint: Secondary | ICD-10-CM

## 2021-01-16 DIAGNOSIS — K5730 Diverticulosis of large intestine without perforation or abscess without bleeding: Secondary | ICD-10-CM | POA: Diagnosis present

## 2021-01-16 DIAGNOSIS — I480 Paroxysmal atrial fibrillation: Secondary | ICD-10-CM

## 2021-01-16 DIAGNOSIS — E861 Hypovolemia: Secondary | ICD-10-CM | POA: Diagnosis not present

## 2021-01-16 DIAGNOSIS — I5032 Chronic diastolic (congestive) heart failure: Secondary | ICD-10-CM | POA: Diagnosis not present

## 2021-01-16 DIAGNOSIS — G4733 Obstructive sleep apnea (adult) (pediatric): Secondary | ICD-10-CM

## 2021-01-16 DIAGNOSIS — R0602 Shortness of breath: Secondary | ICD-10-CM | POA: Diagnosis not present

## 2021-01-16 DIAGNOSIS — J690 Pneumonitis due to inhalation of food and vomit: Secondary | ICD-10-CM

## 2021-01-16 DIAGNOSIS — I5031 Acute diastolic (congestive) heart failure: Secondary | ICD-10-CM | POA: Diagnosis not present

## 2021-01-16 DIAGNOSIS — I4510 Unspecified right bundle-branch block: Secondary | ICD-10-CM | POA: Diagnosis present

## 2021-01-16 DIAGNOSIS — K219 Gastro-esophageal reflux disease without esophagitis: Secondary | ICD-10-CM | POA: Diagnosis present

## 2021-01-16 DIAGNOSIS — R601 Generalized edema: Secondary | ICD-10-CM

## 2021-01-16 DIAGNOSIS — Z4659 Encounter for fitting and adjustment of other gastrointestinal appliance and device: Secondary | ICD-10-CM

## 2021-01-16 DIAGNOSIS — M1712 Unilateral primary osteoarthritis, left knee: Secondary | ICD-10-CM | POA: Diagnosis present

## 2021-01-16 DIAGNOSIS — Z8249 Family history of ischemic heart disease and other diseases of the circulatory system: Secondary | ICD-10-CM

## 2021-01-16 DIAGNOSIS — E876 Hypokalemia: Secondary | ICD-10-CM | POA: Diagnosis not present

## 2021-01-16 DIAGNOSIS — J9811 Atelectasis: Secondary | ICD-10-CM | POA: Diagnosis not present

## 2021-01-16 DIAGNOSIS — I25118 Atherosclerotic heart disease of native coronary artery with other forms of angina pectoris: Secondary | ICD-10-CM | POA: Diagnosis not present

## 2021-01-16 DIAGNOSIS — M109 Gout, unspecified: Secondary | ICD-10-CM | POA: Diagnosis present

## 2021-01-16 DIAGNOSIS — H9190 Unspecified hearing loss, unspecified ear: Secondary | ICD-10-CM | POA: Diagnosis present

## 2021-01-16 DIAGNOSIS — K521 Toxic gastroenteritis and colitis: Secondary | ICD-10-CM | POA: Diagnosis present

## 2021-01-16 DIAGNOSIS — E860 Dehydration: Secondary | ICD-10-CM | POA: Diagnosis present

## 2021-01-16 DIAGNOSIS — J9602 Acute respiratory failure with hypercapnia: Secondary | ICD-10-CM | POA: Diagnosis not present

## 2021-01-16 DIAGNOSIS — K2970 Gastritis, unspecified, without bleeding: Secondary | ICD-10-CM | POA: Diagnosis not present

## 2021-01-16 DIAGNOSIS — Z6833 Body mass index (BMI) 33.0-33.9, adult: Secondary | ICD-10-CM | POA: Diagnosis not present

## 2021-01-16 DIAGNOSIS — E440 Moderate protein-calorie malnutrition: Secondary | ICD-10-CM

## 2021-01-16 DIAGNOSIS — N171 Acute kidney failure with acute cortical necrosis: Secondary | ICD-10-CM | POA: Diagnosis not present

## 2021-01-16 DIAGNOSIS — R109 Unspecified abdominal pain: Secondary | ICD-10-CM

## 2021-01-16 DIAGNOSIS — K921 Melena: Secondary | ICD-10-CM | POA: Diagnosis not present

## 2021-01-16 DIAGNOSIS — I2510 Atherosclerotic heart disease of native coronary artery without angina pectoris: Secondary | ICD-10-CM | POA: Diagnosis present

## 2021-01-16 DIAGNOSIS — Z7984 Long term (current) use of oral hypoglycemic drugs: Secondary | ICD-10-CM

## 2021-01-16 DIAGNOSIS — I4819 Other persistent atrial fibrillation: Secondary | ICD-10-CM | POA: Diagnosis not present

## 2021-01-16 DIAGNOSIS — Z8719 Personal history of other diseases of the digestive system: Secondary | ICD-10-CM

## 2021-01-16 DIAGNOSIS — D649 Anemia, unspecified: Secondary | ICD-10-CM

## 2021-01-16 HISTORY — PX: TOTAL KNEE ARTHROPLASTY: SHX125

## 2021-01-16 LAB — GLUCOSE, CAPILLARY
Glucose-Capillary: 108 mg/dL — ABNORMAL HIGH (ref 70–99)
Glucose-Capillary: 114 mg/dL — ABNORMAL HIGH (ref 70–99)
Glucose-Capillary: 156 mg/dL — ABNORMAL HIGH (ref 70–99)
Glucose-Capillary: 120 mg/dL — ABNORMAL HIGH (ref 70–99)

## 2021-01-16 LAB — HEMOGLOBIN A1C
Hgb A1c MFr Bld: 5.7 % — ABNORMAL HIGH (ref 4.8–5.6)
Mean Plasma Glucose: 116.89 mg/dL

## 2021-01-16 SURGERY — ARTHROPLASTY, KNEE, TOTAL
Anesthesia: Monitor Anesthesia Care | Site: Knee | Laterality: Left

## 2021-01-16 MED ORDER — METHOCARBAMOL 1000 MG/10ML IJ SOLN
500.0000 mg | Freq: Four times a day (QID) | INTRAVENOUS | Status: DC | PRN
  Filled 2021-01-16: qty 5

## 2021-01-16 MED ORDER — FENTANYL CITRATE (PF) 250 MCG/5ML IJ SOLN
INTRAMUSCULAR | Status: DC | PRN
  Administered 2021-01-16: 25 ug via INTRAVENOUS
  Administered 2021-01-16: 50 ug via INTRAVENOUS
  Administered 2021-01-16: 25 ug via INTRAVENOUS

## 2021-01-16 MED ORDER — CALCIUM CARBONATE-VITAMIN D 500-200 MG-UNIT PO TABS
1.0000 | ORAL_TABLET | Freq: Every day | ORAL | Status: DC
  Administered 2021-01-17 – 2021-01-19 (×2): 1 via ORAL
  Filled 2021-01-16 (×2): qty 1

## 2021-01-16 MED ORDER — FENTANYL CITRATE (PF) 100 MCG/2ML IJ SOLN
INTRAMUSCULAR | Status: AC
  Filled 2021-01-16: qty 2

## 2021-01-16 MED ORDER — 0.9 % SODIUM CHLORIDE (POUR BTL) OPTIME
TOPICAL | Status: DC | PRN
  Administered 2021-01-16: 1000 mL

## 2021-01-16 MED ORDER — ASCORBIC ACID 500 MG PO TABS
1000.0000 mg | ORAL_TABLET | Freq: Every day | ORAL | Status: DC
  Administered 2021-01-17 – 2021-01-19 (×2): 1000 mg via ORAL
  Filled 2021-01-16 (×2): qty 2

## 2021-01-16 MED ORDER — MIDAZOLAM HCL 5 MG/5ML IJ SOLN
INTRAMUSCULAR | Status: DC | PRN
  Administered 2021-01-16: 2 mg via INTRAVENOUS

## 2021-01-16 MED ORDER — VANCOMYCIN HCL 1250 MG/250ML IV SOLN
1250.0000 mg | Freq: Two times a day (BID) | INTRAVENOUS | Status: AC
  Administered 2021-01-16: 1250 mg via INTRAVENOUS
  Filled 2021-01-16: qty 250

## 2021-01-16 MED ORDER — PANTOPRAZOLE SODIUM 40 MG PO TBEC
40.0000 mg | DELAYED_RELEASE_TABLET | Freq: Every day | ORAL | Status: DC
  Administered 2021-01-17 – 2021-01-19 (×2): 40 mg via ORAL
  Filled 2021-01-16 (×2): qty 1

## 2021-01-16 MED ORDER — BUPIVACAINE-EPINEPHRINE 0.25% -1:200000 IJ SOLN
INTRAMUSCULAR | Status: AC
  Filled 2021-01-16: qty 1

## 2021-01-16 MED ORDER — HYDROMORPHONE HCL 1 MG/ML IJ SOLN
0.5000 mg | INTRAMUSCULAR | Status: DC | PRN
  Administered 2021-01-16 – 2021-01-17 (×5): 1 mg via INTRAVENOUS
  Filled 2021-01-16 (×5): qty 1

## 2021-01-16 MED ORDER — BUPIVACAINE IN DEXTROSE 0.75-8.25 % IT SOLN
INTRATHECAL | Status: DC | PRN
  Administered 2021-01-16: 1.8 mL via INTRATHECAL

## 2021-01-16 MED ORDER — INSULIN ASPART 100 UNIT/ML IJ SOLN
0.0000 [IU] | Freq: Three times a day (TID) | INTRAMUSCULAR | Status: DC
  Administered 2021-01-16: 3 [IU] via SUBCUTANEOUS
  Administered 2021-01-17 – 2021-01-18 (×2): 2 [IU] via SUBCUTANEOUS
  Administered 2021-01-18 (×2): 3 [IU] via SUBCUTANEOUS
  Administered 2021-01-19: 2 [IU] via SUBCUTANEOUS
  Administered 2021-01-19 – 2021-01-20 (×4): 3 [IU] via SUBCUTANEOUS

## 2021-01-16 MED ORDER — ONDANSETRON HCL 4 MG PO TABS
4.0000 mg | ORAL_TABLET | Freq: Four times a day (QID) | ORAL | Status: DC | PRN
  Administered 2021-01-19: 4 mg via ORAL
  Filled 2021-01-16: qty 1

## 2021-01-16 MED ORDER — TRANEXAMIC ACID-NACL 1000-0.7 MG/100ML-% IV SOLN
1000.0000 mg | INTRAVENOUS | Status: AC
  Administered 2021-01-16: 1000 mg via INTRAVENOUS
  Filled 2021-01-16: qty 100

## 2021-01-16 MED ORDER — CHOLECALCIFEROL 10 MCG (400 UNIT) PO TABS
400.0000 [IU] | ORAL_TABLET | Freq: Every day | ORAL | Status: DC
  Administered 2021-01-17 – 2021-01-19 (×2): 400 [IU] via ORAL
  Filled 2021-01-16 (×4): qty 1

## 2021-01-16 MED ORDER — HYDROCHLOROTHIAZIDE 25 MG PO TABS
25.0000 mg | ORAL_TABLET | Freq: Every day | ORAL | Status: DC
  Administered 2021-01-17 – 2021-01-18 (×2): 25 mg via ORAL
  Filled 2021-01-16 (×3): qty 1

## 2021-01-16 MED ORDER — KCL IN DEXTROSE-NACL 20-5-0.45 MEQ/L-%-% IV SOLN
INTRAVENOUS | Status: AC
  Filled 2021-01-16 (×2): qty 1000

## 2021-01-16 MED ORDER — FUROSEMIDE 20 MG PO TABS: 20.0000 mg | ORAL_TABLET | Freq: Every day | ORAL | Status: DC | PRN

## 2021-01-16 MED ORDER — DOCUSATE SODIUM 100 MG PO CAPS
100.0000 mg | ORAL_CAPSULE | Freq: Two times a day (BID) | ORAL | Status: DC
  Administered 2021-01-16 – 2021-01-19 (×8): 100 mg via ORAL
  Filled 2021-01-16 (×8): qty 1

## 2021-01-16 MED ORDER — POLYETHYLENE GLYCOL 3350 17 G PO PACK
17.0000 g | PACK | Freq: Every day | ORAL | Status: DC | PRN
  Administered 2021-01-19: 17 g via ORAL
  Filled 2021-01-16: qty 1

## 2021-01-16 MED ORDER — LIDOCAINE 2% (20 MG/ML) 5 ML SYRINGE
INTRAMUSCULAR | Status: DC | PRN
  Administered 2021-01-16: 20 mg via INTRAVENOUS

## 2021-01-16 MED ORDER — BUPIVACAINE-EPINEPHRINE 0.25% -1:200000 IJ SOLN
INTRAMUSCULAR | Status: DC | PRN
  Administered 2021-01-16: 40 mL

## 2021-01-16 MED ORDER — OXYCODONE HCL 5 MG PO TABS
10.0000 mg | ORAL_TABLET | ORAL | Status: DC | PRN
  Administered 2021-01-16 – 2021-01-18 (×9): 10 mg via ORAL
  Filled 2021-01-16 (×9): qty 2

## 2021-01-16 MED ORDER — DOXAZOSIN MESYLATE 1 MG PO TABS
8.0000 mg | ORAL_TABLET | Freq: Every day | ORAL | Status: DC
  Administered 2021-01-16 – 2021-01-18 (×3): 8 mg via ORAL
  Filled 2021-01-16 (×5): qty 1

## 2021-01-16 MED ORDER — OXYCODONE HCL 5 MG PO TABS: 5.0000 mg | ORAL_TABLET | ORAL | Status: DC | PRN

## 2021-01-16 MED ORDER — ADULT MULTIVITAMIN W/MINERALS CH
1.0000 | ORAL_TABLET | Freq: Every day | ORAL | Status: DC
  Administered 2021-01-17 – 2021-01-19 (×2): 1 via ORAL
  Filled 2021-01-16 (×2): qty 1

## 2021-01-16 MED ORDER — PHENYLEPHRINE HCL (PRESSORS) 10 MG/ML IV SOLN
INTRAVENOUS | Status: AC
  Filled 2021-01-16: qty 1

## 2021-01-16 MED ORDER — VITAMIN E 45 MG (100 UNIT) PO CAPS
400.0000 [IU] | ORAL_CAPSULE | Freq: Every day | ORAL | Status: DC
  Administered 2021-01-17: 400 [IU] via ORAL
  Filled 2021-01-16 (×2): qty 4

## 2021-01-16 MED ORDER — MIDAZOLAM HCL 2 MG/2ML IJ SOLN
INTRAMUSCULAR | Status: AC
  Filled 2021-01-16: qty 2

## 2021-01-16 MED ORDER — ACETAMINOPHEN 325 MG PO TABS: 325.0000 mg | ORAL_TABLET | Freq: Four times a day (QID) | ORAL | Status: DC | PRN

## 2021-01-16 MED ORDER — PROPOFOL 500 MG/50ML IV EMUL
INTRAVENOUS | Status: DC | PRN
  Administered 2021-01-16: 75 ug/kg/min via INTRAVENOUS

## 2021-01-16 MED ORDER — SPIRONOLACTONE 25 MG PO TABS
25.0000 mg | ORAL_TABLET | Freq: Every day | ORAL | Status: DC
  Administered 2021-01-16 – 2021-01-18 (×3): 25 mg via ORAL
  Filled 2021-01-16 (×4): qty 1

## 2021-01-16 MED ORDER — ASPIRIN 81 MG PO CHEW
81.0000 mg | CHEWABLE_TABLET | Freq: Two times a day (BID) | ORAL | Status: DC
  Administered 2021-01-17 – 2021-01-19 (×6): 81 mg via ORAL
  Filled 2021-01-16 (×6): qty 1

## 2021-01-16 MED ORDER — BISACODYL 5 MG PO TBEC
5.0000 mg | DELAYED_RELEASE_TABLET | Freq: Every day | ORAL | Status: DC | PRN
  Administered 2021-01-19: 5 mg via ORAL
  Filled 2021-01-16: qty 1

## 2021-01-16 MED ORDER — ACETAMINOPHEN 500 MG PO TABS
1000.0000 mg | ORAL_TABLET | Freq: Four times a day (QID) | ORAL | Status: AC
  Administered 2021-01-16 – 2021-01-17 (×3): 1000 mg via ORAL
  Filled 2021-01-16 (×4): qty 2

## 2021-01-16 MED ORDER — LISINOPRIL 20 MG PO TABS
40.0000 mg | ORAL_TABLET | Freq: Every day | ORAL | Status: DC
  Administered 2021-01-17 – 2021-01-18 (×2): 40 mg via ORAL
  Filled 2021-01-16 (×2): qty 2

## 2021-01-16 MED ORDER — PROPOFOL 10 MG/ML IV BOLUS
INTRAVENOUS | Status: AC
  Filled 2021-01-16: qty 20

## 2021-01-16 MED ORDER — PHENYLEPHRINE HCL-NACL 10-0.9 MG/250ML-% IV SOLN
INTRAVENOUS | Status: DC | PRN
  Administered 2021-01-16: 50 ug/min via INTRAVENOUS

## 2021-01-16 MED ORDER — DIPHENHYDRAMINE HCL 12.5 MG/5ML PO ELIX: 12.5000 mg | ORAL_SOLUTION | ORAL | Status: DC | PRN

## 2021-01-16 MED ORDER — IPRATROPIUM-ALBUTEROL 0.5-2.5 (3) MG/3ML IN SOLN
3.0000 mL | Freq: Four times a day (QID) | RESPIRATORY_TRACT | Status: DC | PRN
  Filled 2021-01-16: qty 3

## 2021-01-16 MED ORDER — ACETAMINOPHEN 10 MG/ML IV SOLN
1000.0000 mg | INTRAVENOUS | Status: AC
  Administered 2021-01-16: 1000 mg via INTRAVENOUS
  Filled 2021-01-16: qty 100

## 2021-01-16 MED ORDER — ONDANSETRON HCL 4 MG/2ML IJ SOLN
4.0000 mg | Freq: Four times a day (QID) | INTRAMUSCULAR | Status: DC | PRN
  Administered 2021-01-20: 4 mg via INTRAVENOUS
  Filled 2021-01-16: qty 2

## 2021-01-16 MED ORDER — METOCLOPRAMIDE HCL 5 MG PO TABS: 5.0000 mg | ORAL_TABLET | Freq: Three times a day (TID) | ORAL | Status: DC | PRN

## 2021-01-16 MED ORDER — METOCLOPRAMIDE HCL 5 MG/ML IJ SOLN
5.0000 mg | Freq: Three times a day (TID) | INTRAMUSCULAR | Status: DC | PRN
Start: 2021-01-16 — End: 2021-01-20

## 2021-01-16 MED ORDER — PHENYLEPHRINE 40 MCG/ML (10ML) SYRINGE FOR IV PUSH (FOR BLOOD PRESSURE SUPPORT)
PREFILLED_SYRINGE | INTRAVENOUS | Status: AC
  Filled 2021-01-16: qty 10

## 2021-01-16 MED ORDER — ROPIVACAINE HCL 7.5 MG/ML IJ SOLN
INTRAMUSCULAR | Status: DC | PRN
  Administered 2021-01-16: 20 mL via PERINEURAL

## 2021-01-16 MED ORDER — PHENOL 1.4 % MT LIQD
1.0000 | OROMUCOSAL | Status: DC | PRN
  Filled 2021-01-16: qty 177

## 2021-01-16 MED ORDER — ALUM & MAG HYDROXIDE-SIMETH 200-200-20 MG/5ML PO SUSP
30.0000 mL | ORAL | Status: DC | PRN
  Administered 2021-01-19 – 2021-01-20 (×3): 30 mL via ORAL
  Filled 2021-01-16 (×3): qty 30

## 2021-01-16 MED ORDER — POTASSIUM CHLORIDE ER 10 MEQ PO TBCR
10.0000 meq | EXTENDED_RELEASE_TABLET | Freq: Every day | ORAL | Status: DC | PRN
  Filled 2021-01-16: qty 1

## 2021-01-16 MED ORDER — METHOCARBAMOL 500 MG PO TABS
500.0000 mg | ORAL_TABLET | Freq: Four times a day (QID) | ORAL | Status: DC | PRN
  Administered 2021-01-17 – 2021-01-19 (×6): 500 mg via ORAL
  Filled 2021-01-16 (×6): qty 1

## 2021-01-16 MED ORDER — BUPIVACAINE LIPOSOME 1.3 % IJ SUSP
20.0000 mL | Freq: Once | INTRAMUSCULAR | Status: AC
  Administered 2021-01-16: 20 mL
  Filled 2021-01-16: qty 20

## 2021-01-16 MED ORDER — OXYBUTYNIN CHLORIDE 5 MG PO TABS
5.0000 mg | ORAL_TABLET | Freq: Every day | ORAL | Status: DC
  Administered 2021-01-17 – 2021-01-19 (×3): 5 mg via ORAL
  Filled 2021-01-16 (×3): qty 1

## 2021-01-16 MED ORDER — LACTATED RINGERS IV SOLN: INTRAVENOUS | Status: DC

## 2021-01-16 MED ORDER — ORAL CARE MOUTH RINSE
15.0000 mL | Freq: Once | OROMUCOSAL | Status: AC
Start: 2021-01-16 — End: 2021-01-16

## 2021-01-16 MED ORDER — LIDOCAINE 2% (20 MG/ML) 5 ML SYRINGE
INTRAMUSCULAR | Status: AC
  Filled 2021-01-16: qty 5

## 2021-01-16 MED ORDER — ALLOPURINOL 100 MG PO TABS
100.0000 mg | ORAL_TABLET | Freq: Every day | ORAL | Status: DC
  Administered 2021-01-16 – 2021-01-19 (×4): 100 mg via ORAL
  Filled 2021-01-16 (×4): qty 1

## 2021-01-16 MED ORDER — METOPROLOL TARTRATE 25 MG PO TABS
25.0000 mg | ORAL_TABLET | Freq: Every evening | ORAL | Status: DC
  Administered 2021-01-16 – 2021-01-19 (×3): 25 mg via ORAL
  Filled 2021-01-16 (×3): qty 1

## 2021-01-16 MED ORDER — DEXAMETHASONE SODIUM PHOSPHATE 10 MG/ML IJ SOLN
INTRAMUSCULAR | Status: DC | PRN
  Administered 2021-01-16: 6 mg

## 2021-01-16 MED ORDER — SODIUM CHLORIDE 0.9% FLUSH
INTRAVENOUS | Status: DC | PRN
  Administered 2021-01-16: 1000 mL
  Administered 2021-01-16: 50 mL
  Administered 2021-01-16: 40 mL

## 2021-01-16 MED ORDER — PHENYLEPHRINE 40 MCG/ML (10ML) SYRINGE FOR IV PUSH (FOR BLOOD PRESSURE SUPPORT)
PREFILLED_SYRINGE | INTRAVENOUS | Status: DC | PRN
  Administered 2021-01-16: 120 ug via INTRAVENOUS
  Administered 2021-01-16 (×2): 80 ug via INTRAVENOUS

## 2021-01-16 MED ORDER — CHLORHEXIDINE GLUCONATE 0.12 % MT SOLN
15.0000 mL | Freq: Once | OROMUCOSAL | Status: AC
  Administered 2021-01-16: 15 mL via OROMUCOSAL

## 2021-01-16 MED ORDER — MAGNESIUM CITRATE PO SOLN
1.0000 | Freq: Once | ORAL | Status: AC | PRN
  Administered 2021-01-19: 1 via ORAL
  Filled 2021-01-16: qty 296

## 2021-01-16 MED ORDER — MENTHOL 3 MG MT LOZG: 1.0000 | LOZENGE | OROMUCOSAL | Status: DC | PRN

## 2021-01-16 MED ORDER — IRRISEPT - 450ML BOTTLE WITH 0.05% CHG IN STERILE WATER, USP 99.95% OPTIME
TOPICAL | Status: DC | PRN
  Administered 2021-01-16: 450 mL via TOPICAL

## 2021-01-16 MED ORDER — SODIUM CHLORIDE 0.9 % IR SOLN
Status: DC | PRN
  Administered 2021-01-16: 1000 mL

## 2021-01-16 MED ORDER — METOPROLOL SUCCINATE ER 25 MG PO TB24
50.0000 mg | ORAL_TABLET | Freq: Every day | ORAL | Status: DC
  Administered 2021-01-17 – 2021-01-19 (×3): 50 mg via ORAL
  Filled 2021-01-16 (×3): qty 1

## 2021-01-16 SURGICAL SUPPLY — 77 items
ATTUNE MED DOME PAT 38 KNEE (Knees) ×2 IMPLANT
ATTUNE PS FEM LT SZ 8 CEM KNEE (Femur) ×2 IMPLANT
ATTUNE PSRP INSR SZ8 5 KNEE (Insert) ×2 IMPLANT
BAG DECANTER FOR FLEXI CONT (MISCELLANEOUS) ×2 IMPLANT
BAG ZIPLOCK 12X15 (MISCELLANEOUS) ×2 IMPLANT
BASE TIBIAL ROT PLAT SZ 7 KNEE (Knees) ×1 IMPLANT
BLADE SAW SGTL 11.0X1.19X90.0M (BLADE) ×2 IMPLANT
BLADE SAW SGTL 13.0X1.19X90.0M (BLADE) ×2 IMPLANT
BLADE SURG SZ10 CARB STEEL (BLADE) ×4 IMPLANT
BNDG COHESIVE 4X5 TAN STRL (GAUZE/BANDAGES/DRESSINGS) ×2 IMPLANT
BNDG ELASTIC 4X5.8 VLCR STR LF (GAUZE/BANDAGES/DRESSINGS) ×2 IMPLANT
BNDG ELASTIC 6X5.8 VLCR STR LF (GAUZE/BANDAGES/DRESSINGS) ×2 IMPLANT
CEMENT HV SMART SET (Cement) ×4 IMPLANT
COVER SURGICAL LIGHT HANDLE (MISCELLANEOUS) ×2 IMPLANT
COVER WAND RF STERILE (DRAPES) IMPLANT
CUFF TOURN SGL QUICK 34 (TOURNIQUET CUFF) ×1
CUFF TRNQT CYL 34X4.125X (TOURNIQUET CUFF) ×1 IMPLANT
DECANTER SPIKE VIAL GLASS SM (MISCELLANEOUS) IMPLANT
DRAPE INCISE IOBAN 66X45 STRL (DRAPES) IMPLANT
DRAPE ORTHO SPLIT 77X108 STRL (DRAPES) ×2
DRAPE SHEET LG 3/4 BI-LAMINATE (DRAPES) ×4 IMPLANT
DRAPE SURG ORHT 6 SPLT 77X108 (DRAPES) ×2 IMPLANT
DRAPE U-SHAPE 47X51 STRL (DRAPES) ×2 IMPLANT
DRSG AQUACEL AG ADV 3.5X10 (GAUZE/BANDAGES/DRESSINGS) ×2 IMPLANT
DRSG TEGADERM 4X4.75 (GAUZE/BANDAGES/DRESSINGS) IMPLANT
DURAPREP 26ML APPLICATOR (WOUND CARE) ×2 IMPLANT
ELECT BLADE TIP CTD 4 INCH (ELECTRODE) ×2 IMPLANT
ELECT REM PT RETURN 15FT ADLT (MISCELLANEOUS) ×2 IMPLANT
EVACUATOR 1/8 PVC DRAIN (DRAIN) IMPLANT
GAUZE SPONGE 2X2 8PLY STRL LF (GAUZE/BANDAGES/DRESSINGS) IMPLANT
GLOVE SRG 8 PF TXTR STRL LF DI (GLOVE) ×1 IMPLANT
GLOVE SURG POLYISO LF SZ7.5 (GLOVE) ×4 IMPLANT
GLOVE SURG POLYISO LF SZ8 (GLOVE) ×4 IMPLANT
GLOVE SURG UNDER POLY LF SZ7.5 (GLOVE) ×2 IMPLANT
GLOVE SURG UNDER POLY LF SZ8 (GLOVE) ×1
GOWN STRL REUS W/TWL XL LVL3 (GOWN DISPOSABLE) ×4 IMPLANT
HANDPIECE INTERPULSE COAX TIP (DISPOSABLE) ×1
HEMOSTAT SPONGE AVITENE ULTRA (HEMOSTASIS) IMPLANT
HOLDER FOLEY CATH W/STRAP (MISCELLANEOUS) ×2 IMPLANT
IMMOBILIZER KNEE 20 (SOFTGOODS) ×2
IMMOBILIZER KNEE 20 THIGH 36 (SOFTGOODS) ×1 IMPLANT
JET LAVAGE IRRISEPT WOUND (IRRIGATION / IRRIGATOR) ×2
KIT TURNOVER KIT A (KITS) ×2 IMPLANT
LAVAGE JET IRRISEPT WOUND (IRRIGATION / IRRIGATOR) ×1 IMPLANT
MANIFOLD NEPTUNE II (INSTRUMENTS) ×2 IMPLANT
NDL SAFETY ECLIPSE 18X1.5 (NEEDLE) IMPLANT
NEEDLE HYPO 18GX1.5 SHARP (NEEDLE)
NS IRRIG 1000ML POUR BTL (IV SOLUTION) IMPLANT
PACK TOTAL KNEE CUSTOM (KITS) ×2 IMPLANT
PIN DRILL FIX HALF THREAD (BIT) ×2 IMPLANT
PIN STEINMAN FIXATION KNEE (PIN) ×2 IMPLANT
PROTECTOR NERVE ULNAR (MISCELLANEOUS) ×2 IMPLANT
SAW OSC TIP CART 19.5X105X1.3 (SAW) ×2 IMPLANT
SEALER BIPOLAR AQUA 6.0 (INSTRUMENTS) ×2 IMPLANT
SET HNDPC FAN SPRY TIP SCT (DISPOSABLE) ×1 IMPLANT
SPONGE GAUZE 2X2 STER 10/PKG (GAUZE/BANDAGES/DRESSINGS)
SPONGE SURGIFOAM ABS GEL 100 (HEMOSTASIS) IMPLANT
STAPLER VISISTAT (STAPLE) ×2 IMPLANT
STRIP CLOSURE SKIN 1/2X4 (GAUZE/BANDAGES/DRESSINGS) IMPLANT
SUT BONE WAX W31G (SUTURE) ×2 IMPLANT
SUT MNCRL AB 4-0 PS2 18 (SUTURE) IMPLANT
SUT STRATAFIX 0 PDS 27 VIOLET (SUTURE) ×2
SUT VIC AB 1 CT1 27 (SUTURE) ×3
SUT VIC AB 1 CT1 27XBRD ANTBC (SUTURE) ×3 IMPLANT
SUT VIC AB 1 CTX 36 (SUTURE)
SUT VIC AB 1 CTX36XBRD ANBCTR (SUTURE) IMPLANT
SUT VIC AB 2-0 CT1 27 (SUTURE) ×3
SUT VIC AB 2-0 CT1 TAPERPNT 27 (SUTURE) ×3 IMPLANT
SUTURE STRATFX 0 PDS 27 VIOLET (SUTURE) ×1 IMPLANT
SYR 3ML LL SCALE MARK (SYRINGE) IMPLANT
SYR 50ML LL SCALE MARK (SYRINGE) IMPLANT
TIBIAL BASE ROT PLAT SZ 7 KNEE (Knees) ×2 IMPLANT
TOWER CARTRIDGE SMART MIX (DISPOSABLE) ×2 IMPLANT
TRAY FOLEY MTR SLVR 16FR STAT (SET/KITS/TRAYS/PACK) ×2 IMPLANT
WATER STERILE IRR 1000ML POUR (IV SOLUTION) ×4 IMPLANT
WIPE CHG CHLORHEXIDINE 2% (PERSONAL CARE ITEMS) ×2 IMPLANT
WRAP KNEE MAXI GEL POST OP (GAUZE/BANDAGES/DRESSINGS) ×2 IMPLANT

## 2021-01-16 NOTE — Anesthesia Postprocedure Evaluation (Signed)
Anesthesia Post Note  Patient: [name]  Procedure(s) Performed: TOTAL KNEE ARTHROPLASTY (Left Knee)     Patient location during evaluation: PACU Anesthesia Type: MAC and Spinal Level of consciousness: awake and alert Pain management: pain level controlled Vital Signs Assessment: post-procedure vital signs reviewed and stable Respiratory status: spontaneous breathing and respiratory function stable Cardiovascular status: blood pressure returned to baseline and stable Postop Assessment: spinal receding Anesthetic complications: no   No complications documented.  Last Vitals:  Vitals:   01/16/21 1230 01/16/21 1245  BP: 134/81 134/85  Pulse: 66 69  Resp: 16 17  Temp:    SpO2: 98% 98%    Last Pain:  Vitals:   01/16/21 1245  TempSrc:   PainSc: 0-No pain    LLE Motor Response: Purposeful movement (01/16/21 1248) LLE Sensation: Numbness (01/16/21 1248) RLE Motor Response: Purposeful movement (01/16/21 1248) RLE Sensation: Numbness (01/16/21 1248)      [name]

## 2021-01-16 NOTE — Anesthesia Procedure Notes (Signed)
Anesthesia Regional Block: Adductor canal block   Pre-Anesthetic Checklist: ,, timeout performed, Correct Patient, Correct Site, Correct Laterality, Correct Procedure, Correct Position, site marked, Risks and benefits discussed,  Surgical consent,  Pre-op evaluation,  At surgeon's request and post-op pain management  Laterality: Left  Prep: chloraprep       Needles:  Injection technique: Single-shot  Needle Type: Stimulator Needle - 80     Needle Length: 10cm  Needle Gauge: 21     Additional Needles:   Narrative:  Start time: 01/16/2021 7:49 AM End time: 01/16/2021 7:59 AM Injection made incrementally with aspirations every 5 mL.  Performed by: Personally

## 2021-01-16 NOTE — Anesthesia Procedure Notes (Signed)
Spinal  Patient location during procedure: OR Start time: 01/16/2021 8:51 AM End time: 01/16/2021 8:57 AM Reason for block: surgical anesthesia Staffing Performed: anesthesiologist  Anesthesiologist: Duane Boston, MD Preanesthetic Checklist Completed: patient identified, IV checked, risks and benefits discussed, surgical consent, monitors and equipment checked, pre-op evaluation and timeout performed Spinal Block Patient position: sitting Prep: DuraPrep Patient monitoring: cardiac monitor, continuous pulse ox and blood pressure Approach: midline Location: L2-3 Injection technique: single-shot Needle Needle type: Pencan  Needle gauge: 24 G Needle length: 9 cm Assessment Events: CSF return Additional Notes Functioning IV was confirmed and monitors were applied. Sterile prep and drape, including hand hygiene and sterile gloves were used. The patient was positioned and the spine was prepped. The skin was anesthetized with lidocaine.  Free flow of clear CSF was obtained prior to injecting local anesthetic into the CSF.  The spinal needle aspirated freely following injection.  The needle was carefully withdrawn.  The patient tolerated the procedure well.

## 2021-01-16 NOTE — Transfer of Care (Signed)
Immediate Anesthesia Transfer of Care Note  Patient: [name]  Procedure(s) Performed: TOTAL KNEE ARTHROPLASTY (Left Knee)  Patient Location: PACU  Anesthesia Type:Spinal and MAC combined with regional for post-op pain  Level of Consciousness: awake, alert , oriented and patient cooperative  Airway & Oxygen Therapy: Patient Spontanous Breathing and Patient connected to nasal cannula oxygen  Post-op Assessment: Report given to RN and Post -op Vital signs reviewed and stable  Post vital signs: Reviewed and stable  Last Vitals:  Vitals Value Taken Time  BP 114/76 01/16/21 1123  Temp    Pulse 69 01/16/21 1126  Resp 14 01/16/21 1126  SpO2 100 % 01/16/21 1126  Vitals shown include unvalidated device data.  Last Pain:  Vitals:   01/16/21 0702  TempSrc: Oral  PainSc:          Complications: No complications documented.

## 2021-01-16 NOTE — Plan of Care (Signed)
  Problem: Education: Goal: Knowledge of General Education information will improve Description: Including pain rating scale, medication(s)/side effects and non-pharmacologic comfort measures Outcome: Progressing   Problem: Clinical Measurements: Goal: Ability to maintain clinical measurements within normal limits will improve Outcome: Progressing   Problem: Activity: Goal: Risk for activity intolerance will decrease Outcome: Progressing   Problem: Nutrition: Goal: Adequate nutrition will be maintained Outcome: Progressing   Problem: Coping: Goal: Level of anxiety will decrease Outcome: Progressing   Problem: Elimination: Goal: Will not experience complications related to bowel motility Outcome: Progressing   Problem: Pain Managment: Goal: General experience of comfort will improve Outcome: Progressing   Problem: Safety: Goal: Ability to remain free from injury will improve Outcome: Progressing   Problem: Skin Integrity: Goal: Risk for impaired skin integrity will decrease Outcome: Progressing   Problem: Education: Goal: Knowledge of the prescribed therapeutic regimen will improve Outcome: Progressing

## 2021-01-16 NOTE — Brief Op Note (Signed)
01/16/2021  11:00 AM  PATIENT:  [name]  75 y.o. male  PRE-OPERATIVE DIAGNOSIS:  Left knee degenerative joint disease  POST-OPERATIVE DIAGNOSIS:  Left knee degenerative joint disease  PROCEDURE:  Procedure(s) with comments: TOTAL KNEE ARTHROPLASTY (Left) - 2.5 hrs General vs Spinal  SURGEON:  Surgeon(s) and Role:    Susa Day, MD - Primary  PHYSICIAN ASSISTANT:   ASSISTANTS: [place]   ANESTHESIA:   spinal  EBL:  25   BLOOD ADMINISTERED:none  DRAINS: none   LOCAL MEDICATIONS USED:  MARCAINE     SPECIMEN:  No Specimen  DISPOSITION OF SPECIMEN:  N/A  COUNTS:  YES  TOURNIQUET:   Total Tourniquet Time Documented: Thigh (Left) - 70 minutes Total: Thigh (Left) - 70 minutes   DICTATION: .Other Dictation: Dictation Number [id number]  PLAN OF CARE: Admit to inpatient   PATIENT DISPOSITION:  PACU - hemodynamically stable.   Delay start of Pharmacological VTE agent (>24hrs) due to surgical blood loss or risk of bleeding: no

## 2021-01-16 NOTE — Interval H&P Note (Signed)
History and Physical Interval Note:  01/16/2021 8:24 AM  [name]  has presented today for surgery, with the diagnosis of Left knee degenerative joint disease.  The various methods of treatment have been discussed with the patient and family. After consideration of risks, benefits and other options for treatment, the patient has consented to  Procedure(s) with comments: TOTAL KNEE ARTHROPLASTY (Left) - 2.5 hrs General vs Spinal as a surgical intervention.  The patient's history has been reviewed, patient examined, no change in status, stable for surgery.  I have reviewed the patient's chart and labs.  Questions were answered to the patient's satisfaction.     Johnn Hai

## 2021-01-17 ENCOUNTER — Encounter (HOSPITAL_COMMUNITY): Payer: Self-pay | Admitting: Specialist

## 2021-01-17 ENCOUNTER — Other Ambulatory Visit (HOSPITAL_COMMUNITY): Payer: Self-pay

## 2021-01-17 LAB — CBC
HCT: 41.2 % (ref 39.0–52.0)
Hemoglobin: 14 g/dL (ref 13.0–17.0)
MCH: 32.6 pg (ref 26.0–34.0)
MCHC: 34 g/dL (ref 30.0–36.0)
MCV: 96 fL (ref 80.0–100.0)
Platelets: 151 10*3/uL (ref 150–400)
RBC: 4.29 MIL/uL (ref 4.22–5.81)
RDW: 13.5 % (ref 11.5–15.5)
WBC: 8.8 10*3/uL (ref 4.0–10.5)
nRBC: 0 % (ref 0.0–0.2)

## 2021-01-17 LAB — BASIC METABOLIC PANEL
Anion gap: 5 (ref 5–15)
BUN: 32 mg/dL — ABNORMAL HIGH (ref 8–23)
CO2: 26 mmol/L (ref 22–32)
Calcium: 9.2 mg/dL (ref 8.9–10.3)
Chloride: 106 mmol/L (ref 98–111)
Creatinine, Ser: 1.4 mg/dL — ABNORMAL HIGH (ref 0.61–1.24)
GFR, Estimated: 52 mL/min — ABNORMAL LOW (ref >60)
Glucose, Bld: 144 mg/dL — ABNORMAL HIGH (ref 70–99)
Potassium: 4.8 mmol/L (ref 3.5–5.1)
Sodium: 137 mmol/L (ref 135–145)

## 2021-01-17 LAB — GLUCOSE, CAPILLARY
Glucose-Capillary: 110 mg/dL — ABNORMAL HIGH (ref 70–99)
Glucose-Capillary: 120 mg/dL — ABNORMAL HIGH (ref 70–99)
Glucose-Capillary: 133 mg/dL — ABNORMAL HIGH (ref 70–99)
Glucose-Capillary: 127 mg/dL — ABNORMAL HIGH (ref 70–99)

## 2021-01-17 MED ORDER — OXYCODONE HCL 5 MG PO TABS
5.0000 mg | ORAL_TABLET | ORAL | 0 refills | Status: DC | PRN
  Filled 2021-01-17: qty 42, 7d supply, fill #0

## 2021-01-17 MED ORDER — POLYETHYLENE GLYCOL 3350 17 G PO PACK
17.0000 g | PACK | Freq: Every day | ORAL | 0 refills | Status: AC | PRN
  Filled 2021-01-17: qty 14, 14d supply, fill #0

## 2021-01-17 MED ORDER — DOCUSATE SODIUM 100 MG PO CAPS
100.0000 mg | ORAL_CAPSULE | Freq: Two times a day (BID) | ORAL | 0 refills | Status: AC | PRN
Start: 2021-01-17 — End: ?
  Filled 2021-01-17: qty 40, 20d supply, fill #0

## 2021-01-17 MED ORDER — ASPIRIN 81 MG PO CHEW
81.0000 mg | CHEWABLE_TABLET | Freq: Two times a day (BID) | ORAL | 1 refills | Status: DC
  Filled 2021-01-17: qty 60, 30d supply, fill #0

## 2021-01-17 NOTE — Progress Notes (Incomplete)
Subjective: 1 Day Post-Op Procedure(s) (LRB): TOTAL KNEE ARTHROPLASTY (Left) Patient reports pain as {pain:3041404}.   Denies CP or SOB.  Voiding without difficulty. Positive flatus. Objective: Vital signs in last 24 hours: Temp:  [97.5 F (36.4 C)-98.6 F (37 C)] 97.9 F (36.6 C) (05/12 0622) Pulse Rate:  [63-86] 67 (05/12 0622) Resp:  [12-20] 16 (05/12 0622) BP: (103-157)/(62-96) 103/62 (05/12 0622) SpO2:  [94 %-100 %] 94 % (05/12 0622)  Intake/Output from previous day: 05/11 0701 - 05/12 0700 In: 3143 [P.O.:800; I.V.:2343] Out: 3094 [Urine:2550; Blood:20] Intake/Output this shift: No intake/output data recorded.  Recent Labs    01/17/21 0309  HGB 14.0   Recent Labs    01/17/21 0309  WBC 8.8  RBC 4.29  HCT 41.2  PLT 151   Recent Labs    01/17/21 0309  NA 137  K 4.8  CL 106  CO2 26  BUN 32*  CREATININE 1.40*  GLUCOSE 144*  CALCIUM 9.2   No results for input(s): LABPT, INR in the last 72 hours.  {physical exam:3041401}  Assessment/Plan:  1 Day Post-Op Procedure(s) (LRB): TOTAL KNEE ARTHROPLASTY (Left) {MHWK:0881103}   Active Problems:   S/P TKR (total knee replacement) using cement      Johnn Hai 01/17/2021, @NOW 

## 2021-01-17 NOTE — TOC Initial Note (Addendum)
Transition of Care [facility]) - Initial/Assessment Note    Patient Details  Name: [name] MRN: [id number] Date of Birth: September 25, 1945  [facility] CM/SW Contact:    Lennart Pall, LCSW Phone Number: 01/17/2021, 10:14 AM  Clinical Narrative:                 Met with pt this morning and have spoken with wife via phone call.  Discussing dc plans and both confirming they prefer dc to SNF in [place] to complete short term rehab prior to going home.  PT present and just completed working with pt and agrees that short term SNF is appropriate based on pt's current functional level and assist needs.  Discussed facility choices and will begin SNF bed search.    Addendum:  Pt reports plan is now changed back to dc home with [facility].  Expected Discharge Plan: Skilled Nursing Facility Barriers to Discharge: Continued Medical Work up   Patient Goals and CMS Choice Patient states their goals for this hospitalization and ongoing recovery are:: short term SNF rehab and then home CMS Medicare.gov Compare Post Acute Care list provided to:: Patient Choice offered to / list presented to : Patient  Expected Discharge Plan and Services Expected Discharge Plan: [place] In-house Referral: Clinical Social Work   Post Acute Care Choice: [place] Living arrangements for the past 2 months: [place]                 DME Arranged: N/A DME Agency: NA                  Prior Living Arrangements/Services Living arrangements for the past 2 months: Single Family Home Lives with:: Spouse Patient language and need for interpreter reviewed:: Yes Do you feel safe going back to the place where you live?: Yes      Need for Family Participation in Patient Care: Yes (Comment) Care giver support system in place?: Yes (comment)   Criminal Activity/Legal Involvement Pertinent to Current Situation/Hospitalization: No - Comment as needed  Activities of Daily  Living Home Assistive Devices/Equipment: CPAP,Eyeglasses,Cane (specify quad or straight) ADL Screening (condition at time of admission) Patient's cognitive ability adequate to safely complete daily activities?: Yes Is the patient deaf or have difficulty hearing?: Yes Does the patient have difficulty seeing, even when wearing glasses/contacts?: No Does the patient have difficulty concentrating, remembering, or making decisions?: No Patient able to express need for assistance with ADLs?: Yes Does the patient have difficulty dressing or bathing?: No Independently performs ADLs?: Yes (appropriate for developmental age) Does the patient have difficulty walking or climbing stairs?: Yes Weakness of Legs: Left Weakness of Arms/Hands: None  Permission Sought/Granted Permission sought to share information with : Family Supports Permission granted to share information with : Yes, Verbal Permission Granted  Share Information with NAME: [name]     Permission granted to share info w Relationship: spouse  Permission granted to share info w Contact Information: [phone]  Emotional Assessment Appearance:: Appears stated age Attitude/Demeanor/Rapport: Gracious,Engaged Affect (typically observed): Accepting Orientation: : Oriented to Place,Oriented to Self,Oriented to  Time,Oriented to Situation Alcohol / Substance Use: Not Applicable Psych Involvement: No (comment)  Admission diagnosis:  S/P TKR (total knee replacement) using cement [Z96.659] Patient Active Problem List   Diagnosis Date Noted  . S/P TKR (total knee replacement) using cement 01/16/2021  . 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 Pulmonary HTN ([place]) 10/30/2016  .  Hyperlipidemia 01/30/2016  . Secondary hypertension, unspecified 01/30/2016  . Type 2 diabetes mellitus without complication, without long-term current use of insulin ([place]) 01/30/2016  . Hypogonadism, male 01/30/2016   . Low testosterone 01/30/2016  . Essential hypertension 01/30/2016  . Morbid obesity ([place]) 01/30/2016  . PAD (peripheral artery disease) ([place]) 01/30/2016  . CAD (coronary artery disease) 01/30/2016   PCP:  Bing Neighbors, NP Pharmacy:   [place], [place] [address] Phone: [phone] Fax: [phone]     Social Determinants of Health (SDOH) Interventions    Readmission Risk Interventions Readmission Risk Prevention Plan 01/17/2021  Transportation Screening Complete  PCP or Specialist Appt within 5-7 Days Complete  Home Care Screening Complete  Some recent data might be hidden

## 2021-01-17 NOTE — Op Note (Signed)
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  PROCEDURE PERFORMED:  Left total knee arthroplasty utilizing DePuy Attune rotating platform 8 femur, 7 tibia, 5 mm insert, 38 patella.  ANESTHESIA:  Spinal.    ASSISTANT:  Jesse Che, PA  HISTORY:  A 75 year old end-stage osteoarthrosis medial compartment bone-on-bone, history of avascular necrosis indicated for replacement of the degenerated joint.  DESCRIPTION OF PROCEDURE:  Risks and benefits discussed including bleeding, infection, damage to neurovascular structures, suboptimal range of motion, DVT, PE, anesthetic complications, arthrofibrosis, etc.  TECHNIQUE:  The patient in supine position.  After induction of adequate spinal anesthesia, vancomycin and gentamicin IV left lower extremity was prepped and draped and exsanguinated in usual sterile fashion.  Thigh tourniquet inflated 250 mmHg.  Midline  incision was then made over the skin over the patella.  Full thickness flaps developed, median parapatellar arthrotomy performed.  Soft tissues were elevated medially, preserving the MCL.  Patella everted, knee flexed.  Fat pad debrided.  Osteophytes  were removed with a rongeur.  Tricompartmental osteoarthrosis was noted, particularly in the medial compartment.  There was some osteonecrosis of the medial femoral condyle and tibial plateau.  We did evacuate copious portions of clear synovial fluid.   Remnants of medial and lateral menisci were removed, cauterized and the geniculates.  Leksell rongeur was utilized to fashion a starting hole just above the femoral notch.  This was then drilled, irrigated,  T-handled and then a 5-degree intramedullary  guide with 9 off the distal femur was then placed, pinned, and a distal femoral cut was then performed.  I then sized the femur off the anterior cortex to an 8.  This is pinned in 3 degrees of external rotation and then placed a distal femoral block and  secured it and then performed the anterior, posterior and chamfer cuts with soft tissues protected posteriorly at all times.  Next subluxed the tibia defect medially as well as posterolaterally.  There was some osteonecrosis of the medial tibial plateau  with small region.  External alignment guide bisecting the tibiotalar joint 2 off the defect medial, 3-degree slope.  This was then pinned.  Then, I performed a tibial cut.  I then used a 5 mm extension block.  We had full extension.  Knee was then  reflexed, tibia subluxed.  Sized maximally to 7 just in the medial aspect of the tibial tubercle.  This was then pinned.  We drilled centrally.  Then, a punch guide.  We turned our attention back to the femur.  Used our notch jig.  This was performed  with a saw.  We then placed a trial femur, drilled our lug holes.  A 5 mm insert, reduced the knee.  We had full extension, full flexion, good stability with varus and valgus stressing at 0 and 30 degrees and negative anterior drawer.  We turned our  attention then the patella was everted.  There was some irregularity of the anterior surface of the patella.  It measured a 25, planned to a 16 utilizing the patellar jig.  I then drilled the peg holes medializing them after placement of the paddle trial  parallel to the joint line.  A 41 was slightly overhanging and  we revised to a 38.  This was then reduced and we had excellent patellofemoral tracking.  All instrumentation was removed.  We checked posteriorly.  Capsule was intact.  Popliteus intact.   We used Aquamantys to cauterize capsule and geniculates.  Pulsatile lavage was used within the knee.  Patella everted, knee  flexed, tibia subluxed.  Cement was mixed on the back table in the appropriate fashion.  All surfaces thoroughly dried.  This was  then injected into the tibial canal, digitally pressurizing it.  We had drilled holes in the sclerotic portion of bone medial tibial plateau.  Tibial tray was cemented and impacted.  The femoral component cemented and impacted.  Redundant cement removed.   I placed a 5 trial, reduced it, held in axial load in extension throughout the curing of the cement.  Redundant cement removed and cemented and clamped the patella.  0.25% Marcaine with epinephrine was infiltrated in the joint, deposit in the joint  during curing of the cement.  Next, after appropriate curing of cement, tourniquet was deflated at 68 minutes.  Minimal bleeding was noted, was cauterized.  We then everted the patella and the knee flexed.  Redundant cement removed.  We had excellent  stability, full extension, full flexion.  Excellent patellar tracking.  I removed the 5 insert again redundant cement removed, copiously irrigated pulsatile lavage, placed a 5 permanent insert, reduced it, had full extension and flexion and good  stability.  The knee in slight flexion reapproximated patellar arthrotomy with #1 Vicryl in interrupted figure-of-eight sutures.  This was oversewn with a running Stratafix.  Following this, we had excellent patellofemoral tracking in flexion to gravity  at 90 degrees.  Copiously irrigated the wound.  Subcutaneous was closed with 2-0, the skin with staples.  We had flexion to gravity at 90 degrees.  A sterile dressing applied, placed in immobilizer.  Transported to the recovery room in satisfactory  condition.  The patient tolerated the procedure well.  No complications.    ASSISTANT:  Jesse Bissel, PA  BLOOD LOSS:  25 mL.   Elián.Darby D: 01/16/2021 11:09:10 am T: 01/17/2021 1:46:00 am  JOB: [id number]/ [id number]

## 2021-01-17 NOTE — Evaluation (Signed)
Physical Therapy Evaluation Patient Details Name: [name] MRN: [id number] DOB: Sep 08, 1946 Today's Date: 01/17/2021   History of Present Illness  Pt is a 75 year old male s/p left TKA on 01/16/21  Clinical Impression  Pt is s/p TKA resulting in the deficits listed below (see PT Problem List).  Pt will benefit from skilled PT to increase their independence and safety with mobility to allow discharge to the venue listed below.  Pt assisted with ambulating short distance in hallway.  Pt also performed LE exercises.  Pt reports he plans to d/c to SNF for increased rehab prior to home.     Follow Up Recommendations SNF;Follow surgeon's recommendation for DC plan and follow-up therapies (per pt, SNF upon d/c)    Equipment Recommendations  None recommended by PT    Recommendations for Other Services       Precautions / Restrictions Precautions Precautions: Fall;Knee Restrictions Weight Bearing Restrictions: No LLE Weight Bearing: Weight bearing as tolerated      Mobility  Bed Mobility Overal bed mobility: Needs Assistance Bed Mobility: Supine to Sit     Supine to sit: Min guard;HOB elevated     General bed mobility comments: verbal cues for technique    Transfers Overall transfer level: Needs assistance Equipment used: Rolling walker (2 wheeled) Transfers: Sit to/from Stand Sit to Stand: Min assist         General transfer comment: verbal cues for UE and LE positioning, assist to rise and steady as well as control descent  Ambulation/Gait Ambulation/Gait assistance: Min assist Gait Distance (Feet): 20 Feet Assistive device: Rolling walker (2 wheeled) Gait Pattern/deviations: Step-to pattern;Decreased stance time - left;Antalgic     General Gait Details: verbal cues for sequence, RW positioning, posture, distance limited to pt tolerance  Stairs            Wheelchair Mobility    Modified Rankin (Stroke Patients Only)       Balance                                              Pertinent Vitals/Pain Pain Assessment: 0-10 Pain Score: 3  Pain Location: left knee Pain Descriptors / Indicators: Aching;Sore Pain Intervention(s): Repositioned;Monitored during session;Premedicated before session    [place] expects to be discharged to:: Skilled nursing facility Living Arrangements: Spouse/significant other;Children   Type of Home: House Home Access: Level entry     Home Layout: Laundry or work area in basement;Able to live on main level with bedroom/bathroom Home Equipment: Kasandra Knudsen - single point;Walker - 2 wheels      Prior Function Level of Independence: Independent with assistive device(s)         Comments: using SPC prior to surgery     Hand Dominance        Extremity/Trunk Assessment        Lower Extremity Assessment Lower Extremity Assessment: LLE deficits/detail LLE Deficits / Details: left knee AAROM -10-35*, unable to perform SLR       Communication   Communication: HOH (hearing aides)  Cognition Arousal/Alertness: Awake/alert Behavior During Therapy: WFL for tasks assessed/performed Overall Cognitive Status: Within Functional Limits for tasks assessed  General Comments      Exercises Total Joint Exercises Ankle Circles/Pumps: AROM;Both;10 reps Quad Sets: AROM;Both;10 reps Heel Slides: AAROM;Left;10 reps Hip ABduction/ADduction: AAROM;Left;10 reps Straight Leg Raises: AAROM;10 reps;Left   Assessment/Plan    PT Assessment Patient needs continued PT services  PT Problem List Decreased strength;Decreased mobility;Decreased range of motion;Decreased knowledge of use of DME;Decreased activity tolerance;Pain;Decreased coordination       PT Treatment Interventions Gait training;DME instruction;Therapeutic exercise;Balance training;Functional mobility training;Patient/family education;Therapeutic  activities;Stair training    PT Goals (Current goals can be found in the Care Plan section)  Acute Rehab PT Goals PT Goal Formulation: With patient Time For Goal Achievement: 01/24/21 Potential to Achieve Goals: Good    Frequency 7X/week   Barriers to discharge        Co-evaluation               AM-PAC PT "6 Clicks" Mobility  Outcome Measure Help needed turning from your back to your side while in a flat bed without using bedrails?: A Little Help needed moving from lying on your back to sitting on the side of a flat bed without using bedrails?: A Little Help needed moving to and from a bed to a chair (including a wheelchair)?: A Little Help needed standing up from a chair using your arms (e.g., wheelchair or bedside chair)?: A Little Help needed to walk in hospital room?: A Lot Help needed climbing 3-5 steps with a railing? : A Lot 6 Click Score: 16    End of Session Equipment Utilized During Treatment: Gait belt;Left knee immobilizer Activity Tolerance: Patient tolerated treatment well Patient left: in chair;with call bell/phone within reach;with chair alarm set   PT Visit Diagnosis: Other abnormalities of gait and mobility (R26.89)    Time: 4431-5400 PT Time Calculation (min) (ACUTE ONLY): 30 min   Charges:   PT Evaluation $PT Eval Low Complexity: 1 Low PT Treatments $Therapeutic Exercise: 8-22 mins      Jannette Spanner PT, DPT Acute Rehabilitation Services Pager: [phone] Office: [phone]   York Ram E 01/17/2021, 12:57 PM

## 2021-01-17 NOTE — Progress Notes (Signed)
Patient ID: [name], male   DOB: 1945-10-09, 75 y.o.   MRN: [id number] Subjective: 1 Day Post-Op Procedure(s) (LRB): TOTAL KNEE ARTHROPLASTY (Left) Patient reports pain as mild.    Patient has complaints of Left knee pain  We will start therapy today. Plan is to go home after hospital stay.  Objective: Vital signs in last 24 hours: Temp:  [97.5 F (36.4 C)-98.6 F (37 C)] 98.1 F (36.7 C) (05/12 1042) Pulse Rate:  [67-86] 69 (05/12 1042) Resp:  [16-20] 18 (05/12 1042) BP: (103-157)/(62-94) 113/62 (05/12 1042) SpO2:  [94 %-98 %] 95 % (05/12 1042)  Intake/Output from previous day:  Intake/Output Summary (Last 24 hours) at 01/17/2021 1237 Last data filed at 01/17/2021 0726 Gross per 24 hour  Intake 1543.04 ml  Output 2550 ml  Net -1006.96 ml    Intake/Output this shift: Total I/O In: -  Out: 150 [Urine:150]  Labs: Results for orders placed or performed during the hospital encounter of 01/16/21  Glucose, capillary  Result Value Ref Range   Glucose-Capillary 108 (H) 70 - 99 mg/dL  Glucose, capillary  Result Value Ref Range   Glucose-Capillary 114 (H) 70 - 99 mg/dL  Hemoglobin A1c  Result Value Ref Range   Hgb A1c MFr Bld 5.7 (H) 4.8 - 5.6 %   Mean Plasma Glucose 116.89 mg/dL  CBC  Result Value Ref Range   WBC 8.8 4.0 - 10.5 K/uL   RBC 4.29 4.22 - 5.81 MIL/uL   Hemoglobin 14.0 13.0 - 17.0 g/dL   HCT 41.2 39.0 - 52.0 %   MCV 96.0 80.0 - 100.0 fL   MCH 32.6 26.0 - 34.0 pg   MCHC 34.0 30.0 - 36.0 g/dL   RDW 13.5 11.5 - 15.5 %   Platelets 151 150 - 400 K/uL   nRBC 0.0 0.0 - 0.2 %  Basic metabolic panel  Result Value Ref Range   Sodium 137 135 - 145 mmol/L   Potassium 4.8 3.5 - 5.1 mmol/L   Chloride 106 98 - 111 mmol/L   CO2 26 22 - 32 mmol/L   Glucose, Bld 144 (H) 70 - 99 mg/dL   BUN 32 (H) 8 - 23 mg/dL   Creatinine, Ser 1.40 (H) 0.61 - 1.24 mg/dL   Calcium 9.2 8.9 - 10.3 mg/dL   GFR, Estimated 52 (L) >60 mL/min   Anion gap 5 5 - 15  Glucose, capillary   Result Value Ref Range   Glucose-Capillary 156 (H) 70 - 99 mg/dL  Glucose, capillary  Result Value Ref Range   Glucose-Capillary 120 (H) 70 - 99 mg/dL  Glucose, capillary  Result Value Ref Range   Glucose-Capillary 110 (H) 70 - 99 mg/dL  Glucose, capillary  Result Value Ref Range   Glucose-Capillary 120 (H) 70 - 99 mg/dL    Exam - Neurologically intact ABD soft Neurovascular intact Sensation intact distally Intact pulses distally Dorsiflexion/Plantar flexion intact Incision: dressing C/D/I and no drainage No cellulitis present Compartment soft no calf pain or sign of DVT Dressing - clean, dry, no drainage Motor function intact - moving foot and toes well on exam.   Assessment/Plan: 1 Day Post-Op Procedure(s) (LRB): TOTAL KNEE ARTHROPLASTY (Left)  Advance diet Up with therapy D/C IV fluids Past Medical History:  Diagnosis Date  . 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 Diabetes mellitus without complication ([place])   . GERD (gastroesophageal reflux disease)   . Hx of lymphoma   . Hyperlipidemia   . Hypertension   .  Hypotestosteronemia   . Sleep apnea 2013   wears CPAP    DVT Prophylaxis - ASA Protocol Weight-Bearing as tolerated to Left leg No vaccines Plan for D/C tomorrow Set up home PT  Cecilie Kicks 01/17/2021, 12:37 PM

## 2021-01-17 NOTE — Plan of Care (Signed)

## 2021-01-17 NOTE — Progress Notes (Signed)
Physical Therapy Treatment Patient Details Name: [name] MRN: [id number] DOB: 1945/09/20 Today's Date: 01/17/2021    History of Present Illness Pt is a 75 year old male s/p left TKA on 01/16/21    PT Comments    Pt assisted out of recliner and initially was going to attempt to ambulate however reported intolerable pain and requested back to bed.  Pt would benefit from SNF upon d/c however current plan is now for home with HHPT.    Follow Up Recommendations  Follow surgeon's recommendation for DC plan and follow-up therapies;SNF     Equipment Recommendations  None recommended by PT    Recommendations for Other Services       Precautions / Restrictions Precautions Precautions: Fall;Knee Restrictions LLE Weight Bearing: Weight bearing as tolerated    Mobility  Bed Mobility Overal bed mobility: Needs Assistance Bed Mobility: Sit to Supine     Supine to sit: Min guard;HOB elevated Sit to supine: Min assist   General bed mobility comments: verbal cues for technique    Transfers Overall transfer level: Needs assistance Equipment used: Rolling walker (2 wheeled) Transfers: Sit to/from [facility] Sit to Stand: Min assist Stand pivot transfers: Min assist       General transfer comment: verbal cues for UE and LE positioning, assist to rise and steady as well as control descent  Ambulation/Gait Ambulation/Gait assistance: Min assist Gait Distance (Feet): 3 Feet Assistive device: Rolling walker (2 wheeled) Gait Pattern/deviations: Step-to pattern;Decreased stance time - left;Antalgic     General Gait Details: verbal cues for sequence, RW positioning, posture, initiated gait however pt reports unable to tolerate due to pain so transferred back to bed   Stairs             Wheelchair Mobility    Modified Rankin (Stroke Patients Only)       Balance                                            Cognition  Arousal/Alertness: Awake/alert Behavior During Therapy: WFL for tasks assessed/performed Overall Cognitive Status: Within Functional Limits for tasks assessed                                        Exercises Total Joint Exercises Ankle Circles/Pumps: AROM;Both;10 reps Quad Sets: AROM;Both;10 reps Heel Slides: AAROM;Left;10 reps Hip ABduction/ADduction: AAROM;Left;10 reps Straight Leg Raises: AAROM;10 reps;Left    General Comments        Pertinent Vitals/Pain Pain Assessment: 0-10 Pain Score: 10-Worst pain ever Pain Location: left knee Pain Descriptors / Indicators: Aching;Sore Pain Intervention(s): Monitored during session;Repositioned (pt used call bell for pain meds)    Home Living                      Prior Function            PT Goals (current goals can now be found in the care plan section) Acute Rehab PT Goals PT Goal Formulation: With patient Time For Goal Achievement: 01/24/21 Potential to Achieve Goals: Good Progress towards PT goals: Progressing toward goals    Frequency    7X/week      PT Plan Current plan remains appropriate    Co-evaluation  AM-PAC PT "6 Clicks" Mobility   Outcome Measure  Help needed turning from your back to your side while in a flat bed without using bedrails?: A Little Help needed moving from lying on your back to sitting on the side of a flat bed without using bedrails?: A Little Help needed moving to and from a bed to a chair (including a wheelchair)?: A Little Help needed standing up from a chair using your arms (e.g., wheelchair or bedside chair)?: A Little Help needed to walk in hospital room?: A Lot Help needed climbing 3-5 steps with a railing? : A Lot 6 Click Score: 16    End of Session Equipment Utilized During Treatment: Gait belt;Left knee immobilizer Activity Tolerance: Patient tolerated treatment well Patient left: with call bell/phone within reach;in bed;with bed  alarm set   PT Visit Diagnosis: Other abnormalities of gait and mobility (R26.89)     Time: 4081-4481 PT Time Calculation (min) (ACUTE ONLY): 11 min  Charges:  $Gait Training: 8-22 mins                     Arlyce Dice, DPT Acute Rehabilitation Services Pager: [phone] Office: Emily E 01/17/2021, 4:02 PM

## 2021-01-17 NOTE — Progress Notes (Signed)
Foley catheter removed per order. Pt tolerate well. Peri-care completed

## 2021-01-17 NOTE — NC FL2 (Signed)
[place] LEVEL OF CARE SCREENING TOOL     IDENTIFICATION  Patient Name: [name] Birthdate: Aug 18, 1946 Sex: male Admission Date (Current Location): 01/16/2021  [facility] and [place] Number:  Herbalist and Address:  [facility],  [place] [address] [place]      Provider Number: [id number]  Attending Physician Name and Address:  Susa Day, MD  Relative Name and Phone Number:       Current Level of Care: Hospital Recommended Level of Care: [place] Prior Approval Number:    Date Approved/Denied:   PASRR Number:    Discharge Plan: SNF    Current Diagnoses: Patient Active Problem List   Diagnosis Date Noted  . S/P TKR (total knee replacement) using cement 01/16/2021  . 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 Pulmonary HTN ([place]) 10/30/2016  . Hyperlipidemia 01/30/2016  . Secondary hypertension, unspecified 01/30/2016  . Type 2 diabetes mellitus without complication, without long-term current use of insulin ([place]) 01/30/2016  . Hypogonadism, male 01/30/2016  . Low testosterone 01/30/2016  . Essential hypertension 01/30/2016  . Morbid obesity ([place]) 01/30/2016  . PAD (peripheral artery disease) ([place]) 01/30/2016  . CAD (coronary artery disease) 01/30/2016    Orientation RESPIRATION BLADDER Height & Weight     Self,Time,Situation,Place  Normal Continent Weight: 226 lb (102.5 kg) Height:     BEHAVIORAL SYMPTOMS/MOOD NEUROLOGICAL BOWEL NUTRITION STATUS      Continent Diet (regular)  AMBULATORY STATUS COMMUNICATION OF NEEDS Skin   Limited Assist Verbally Surgical wounds                       Personal Care Assistance Level of Assistance  Bathing,Dressing Bathing Assistance: Limited assistance   Dressing Assistance: Limited assistance     Functional Limitations Info             SPECIAL CARE FACTORS FREQUENCY  OT (By licensed OT),PT (By licensed  PT)     PT Frequency: 5x/wk OT Frequency: 5x/wk            Contractures Contractures Info: Not present    Additional Factors Info  Code Status,Allergies,Insulin Sliding Scale Code Status Info: Full Allergies Info: see MAR           Current Medications (01/17/2021):  This is the current hospital active medication list Current Facility-Administered Medications  Medication Dose Route Frequency Provider Last Rate Last Admin  . acetaminophen (TYLENOL) tablet 1,000 mg  1,000 mg Oral Q6H Susa Day, MD   1,000 mg at 01/17/21 0558  . acetaminophen (TYLENOL) tablet 325-650 mg  325-650 mg Oral Q6H PRN Susa Day, MD      . allopurinol (ZYLOPRIM) tablet 100 mg  100 mg Oral QHS Susa Day, MD   100 mg at 01/16/21 2101  . alum & mag hydroxide-simeth (MAALOX/MYLANTA) 200-200-20 MG/5ML suspension 30 mL  30 mL Oral Q4H PRN Susa Day, MD      . ascorbic acid (VITAMIN C) tablet 1,000 mg  1,000 mg Oral Daily Susa Day, MD      . aspirin chewable tablet 81 mg  81 mg Oral BID Susa Day, MD      . bisacodyl (DULCOLAX) EC tablet 5 mg  5 mg Oral Daily PRN Susa Day, MD      . calcium-vitamin D (OSCAL WITH D) 500-200 MG-UNIT per tablet 1 tablet  1 tablet Oral Daily Susa Day, MD      .  cholecalciferol (VITAMIN D3) tablet 400 Units  400 Units Oral Daily Susa Day, MD      . dextrose 5 % and 0.45 % NaCl with KCl 20 mEq/L infusion   Intravenous Continuous Susa Day, MD 50 mL/hr at 01/16/21 1431 New Bag at 01/16/21 1431  . diphenhydrAMINE (BENADRYL) 12.5 MG/5ML elixir 12.5-25 mg  12.5-25 mg Oral Q4H PRN Susa Day, MD      . docusate sodium (COLACE) capsule 100 mg  100 mg Oral BID Susa Day, MD   100 mg at 01/16/21 2101  . doxazosin (CARDURA) tablet 8 mg  8 mg Oral Daily Susa Day, MD   8 mg at 01/16/21 1428  . furosemide (LASIX) tablet 20 mg  20 mg Oral Daily PRN Susa Day, MD      . hydrochlorothiazide (HYDRODIURIL) tablet 25 mg  25 mg Oral  Daily Susa Day, MD      . HYDROmorphone (DILAUDID) injection 0.5-1 mg  0.5-1 mg Intravenous Q4H PRN Susa Day, MD   1 mg at 01/17/21 0558  . insulin aspart (novoLOG) injection 0-15 Units  0-15 Units Subcutaneous TID WC Susa Day, MD   3 Units at 01/16/21 1741  . ipratropium-albuterol (DUONEB) 0.5-2.5 (3) MG/3ML nebulizer solution 3 mL  3 mL Inhalation Q6H PRN Susa Day, MD      . lisinopril (ZESTRIL) tablet 40 mg  40 mg Oral QHS Susa Day, MD      . magnesium citrate solution 1 Bottle  1 Bottle Oral Once PRN Susa Day, MD      . menthol-cetylpyridinium (CEPACOL) lozenge 3 mg  1 lozenge Oral PRN Susa Day, MD       Or  . phenol (CHLORASEPTIC) mouth spray 1 spray  1 spray Mouth/Throat PRN Susa Day, MD      . methocarbamol (ROBAXIN) tablet 500 mg  500 mg Oral Q6H PRN Susa Day, MD   500 mg at 01/17/21 0454   Or  . methocarbamol (ROBAXIN) 500 mg in dextrose 5 % 50 mL IVPB  500 mg Intravenous Q6H PRN Susa Day, MD      . metoCLOPramide (REGLAN) tablet 5-10 mg  5-10 mg Oral Q8H PRN Susa Day, MD       Or  . metoCLOPramide (REGLAN) injection 5-10 mg  5-10 mg Intravenous Q8H PRN Susa Day, MD      . metoprolol succinate (TOPROL-XL) 24 hr tablet 50 mg  50 mg Oral Daily Susa Day, MD      . metoprolol tartrate (LOPRESSOR) tablet 25 mg  25 mg Oral QPM Susa Day, MD   25 mg at 01/16/21 1743  . multivitamin with minerals tablet 1 tablet  1 tablet Oral Daily Susa Day, MD      . ondansetron [facility]) tablet 4 mg  4 mg Oral Q6H PRN Susa Day, MD       Or  . ondansetron (ZOFRAN) injection 4 mg  4 mg Intravenous Q6H PRN Susa Day, MD      . oxybutynin (DITROPAN) tablet 5 mg  5 mg Oral Daily Susa Day, MD      . oxyCODONE (Oxy IR/ROXICODONE) immediate release tablet 10 mg  10 mg Oral Q4H PRN Susa Day, MD   10 mg at 01/17/21 0454  . oxyCODONE (Oxy IR/ROXICODONE) immediate release tablet 5 mg  5 mg Oral Q4H PRN Susa Day, MD      . pantoprazole (PROTONIX) EC tablet 40 mg  40 mg Oral Daily Susa Day, MD      .  polyethylene glycol (MIRALAX / GLYCOLAX) packet 17 g  17 g Oral Daily PRN Susa Day, MD      . potassium chloride (KLOR-CON) CR tablet 10 mEq  10 mEq Oral Daily PRN Susa Day, MD      . spironolactone (ALDACTONE) tablet 25 mg  25 mg Oral QHS Susa Day, MD   25 mg at 01/16/21 2101  . vitamin E capsule 400 Units  400 Units Oral Daily Susa Day, MD         Discharge Medications: Please see discharge summary for a list of discharge medications.  Relevant Imaging Results:  Relevant Lab Results:   Additional Information SS# [id number]  Lennart Pall, LCSW

## 2021-01-18 LAB — CBC
HCT: 42 % (ref 39.0–52.0)
Hemoglobin: 14.1 g/dL (ref 13.0–17.0)
MCH: 32 pg (ref 26.0–34.0)
MCHC: 33.6 g/dL (ref 30.0–36.0)
MCV: 95.5 fL (ref 80.0–100.0)
Platelets: 149 10*3/uL — ABNORMAL LOW (ref 150–400)
RBC: 4.4 MIL/uL (ref 4.22–5.81)
RDW: 13.4 % (ref 11.5–15.5)
WBC: 9.3 10*3/uL (ref 4.0–10.5)
nRBC: 0 % (ref 0.0–0.2)

## 2021-01-18 LAB — GLUCOSE, CAPILLARY
Glucose-Capillary: 145 mg/dL — ABNORMAL HIGH (ref 70–99)
Glucose-Capillary: 152 mg/dL — ABNORMAL HIGH (ref 70–99)
Glucose-Capillary: 178 mg/dL — ABNORMAL HIGH (ref 70–99)
Glucose-Capillary: 147 mg/dL — ABNORMAL HIGH (ref 70–99)

## 2021-01-18 NOTE — Progress Notes (Signed)
Subjective: 2 Days Post-Op Procedure(s) (LRB): TOTAL KNEE ARTHROPLASTY (Left) Patient reports pain as 4 on 0-10 scale.   Denies CP or SOB.  Voiding without difficulty. Positive flatus. Objective: Vital signs in last 24 hours: Temp:  [97.9 F (36.6 C)-98.9 F (37.2 C)] 98 F (36.7 C) (05/13 0444) Pulse Rate:  [87-97] 90 (05/13 0444) Resp:  [16-19] 16 (05/13 0444) BP: (116-155)/(68-82) 154/74 (05/13 0444) SpO2:  [93 %-98 %] 94 % (05/13 0444)  Intake/Output from previous day: 05/12 0701 - 05/13 0700 In: 821.1 [P.O.:780; I.V.:41.1] Out: 902 [Urine:901; Stool:1] Intake/Output this shift: Total I/O In: -  Out: 625 [Urine:625]  Recent Labs    01/17/21 0309 01/18/21 0339  HGB 14.0 14.1   Recent Labs    01/17/21 0309 01/18/21 0339  WBC 8.8 9.3  RBC 4.29 4.40  HCT 41.2 42.0  PLT 151 149*   Recent Labs    01/17/21 0309  NA 137  K 4.8  CL 106  CO2 26  BUN 32*  CREATININE 1.40*  GLUCOSE 144*  CALCIUM 9.2   No results for input(s): LABPT, INR in the last 72 hours.  Neurologically intact Neurovascular intact Intact pulses distally Incision: dressing C/D/I  Minimal swelling. No DVT  Assessment/Plan:  2 Days Post-Op Procedure(s) (LRB): TOTAL KNEE ARTHROPLASTY (Left) Advance diet D/C IV fluids Discharge home with home health D/C instructions given  Active Problems:   S/P TKR (total knee replacement) using cement      Johnn Hai 01/18/2021, @NOW 

## 2021-01-18 NOTE — Progress Notes (Signed)
Physical Therapy Treatment Patient Details Name: [name] MRN: [id number] DOB: [birth year] Today's Date: 01/18/2021    History of Present Illness Pt is a 75 year old male s/p left TKA on 01/16/21    PT Comments    Pt wanting to get OOB however in 10/10 pain so only able to transfer to recliner this morning.    Follow Up Recommendations  Follow surgeon's recommendation for DC plan and follow-up therapies;SNF     Equipment Recommendations  None recommended by PT    Recommendations for Other Services       Precautions / Restrictions Precautions Precautions: Fall;Knee Restrictions LLE Weight Bearing: Weight bearing as tolerated    Mobility  Bed Mobility Overal bed mobility: Needs Assistance Bed Mobility: Supine to Sit     Supine to sit: Min guard;HOB elevated     General bed mobility comments: verbal cues for technique    Transfers Overall transfer level: Needs assistance Equipment used: Rolling walker (2 wheeled) Transfers: Sit to/from [facility] Sit to Stand: Min assist Stand pivot transfers: Min assist       General transfer comment: verbal cues for UE and LE positioning, assist to rise and steady as well as control descent  Ambulation/Gait                 Stairs             Wheelchair Mobility    Modified Rankin (Stroke Patients Only)       Balance                                            Cognition Arousal/Alertness: Awake/alert Behavior During Therapy: WFL for tasks assessed/performed Overall Cognitive Status: Within Functional Limits for tasks assessed                                        Exercises      General Comments        Pertinent Vitals/Pain Pain Assessment: 0-10 Pain Score: 10-Worst pain ever Pain Location: left knee Pain Descriptors / Indicators: Aching;Sore Pain Intervention(s): Repositioned;Monitored during session;Premedicated before session;Ice  applied    Home Living                      Prior Function            PT Goals (current goals can now be found in the care plan section) Progress towards PT goals: Progressing toward goals    Frequency    7X/week      PT Plan Current plan remains appropriate    Co-evaluation              AM-PAC PT "6 Clicks" Mobility   Outcome Measure  Help needed turning from your back to your side while in a flat bed without using bedrails?: A Little Help needed moving from lying on your back to sitting on the side of a flat bed without using bedrails?: A Little Help needed moving to and from a bed to a chair (including a wheelchair)?: A Little Help needed standing up from a chair using your arms (e.g., wheelchair or bedside chair)?: A Lot Help needed to walk in hospital room?: A Lot Help needed climbing 3-5 steps with a railing? : A  Lot 6 Click Score: 15    End of Session Equipment Utilized During Treatment: Gait belt;Left knee immobilizer Activity Tolerance: Patient tolerated treatment well Patient left: with call bell/phone within reach;in chair;with chair alarm set   PT Visit Diagnosis: Other abnormalities of gait and mobility (R26.89)     Time: 7425-9563 PT Time Calculation (min) (ACUTE ONLY): 11 min  Charges:  $Therapeutic Activity: 8-22 mins                     Jannette Spanner PT, DPT Acute Rehabilitation Services Pager: [phone] Office: [place] 01/18/2021, 3:00 PM

## 2021-01-18 NOTE — TOC Transition Note (Signed)
Transition of Care [facility]) - CM/SW Discharge Note   Patient Details  Name: [name] MRN: [id number] Date of Birth: 15-Mar-1946  [facility] of [facility]) CM/SW Contact:  Lennart Pall, LCSW Phone Number: 01/18/2021, 10:47 AM   Clinical Narrative:    Pt and wife hopeful he will be cleared for dc home today.  Have arranged HHPT via [facility].  No DME needs.  No further TOC needs.   Final next level of care: [place] Barriers to Discharge: Barriers Resolved   Patient Goals and CMS Choice Patient states their goals for this hospitalization and ongoing recovery are:: short term SNF rehab and then home CMS Medicare.gov Compare Post Acute Care list provided to:: Patient Choice offered to / list presented to : Patient  Discharge Placement                       Discharge Plan and Services In-house Referral: Clinical Social Work   Post Acute Care Choice: [place]          DME Arranged: N/A DME Agency: NA       HH Arranged: PT [place] Agency: [facility] [place] office) Date [place]: 01/18/21 Time [place]: 1000 Representative spoke with at [place]: [place] (ph# [phone]; fax # [phone])  Social Determinants of Health ([place]) Interventions     Readmission Risk Interventions Readmission Risk Prevention Plan 01/17/2021  Transportation Screening Complete  PCP or Specialist Appt within 5-7 Days Complete  Home Care Screening Complete  Some recent data might be hidden

## 2021-01-18 NOTE — Progress Notes (Signed)
Physical Therapy Treatment Patient Details Name: [name] MRN: [id number] DOB: 12-15-1945 Today's Date: 01/18/2021    History of Present Illness Pt is a 75 year old male s/p left TKA on 01/16/21    PT Comments    Pt assisted with ambulating short distance however limited by fatigue, pain, and mild dizziness.  Pt reports not sleeping last night and overall not feeling well.  Pt requiring assist for mobility and cues for safety and does not appear safe to d/c home today.  Pt's spouse and son present and observed session.  Pt preferring leaving today however after discussion, agreeable to stay due to safety concerns.  Both spouse and son also agree pt should spend another night in hospital and do not feel comfortable with d/c today.  RN notified.   Follow Up Recommendations  Follow surgeon's recommendation for DC plan and follow-up therapies;SNF     Equipment Recommendations  None recommended by PT    Recommendations for Other Services       Precautions / Restrictions Precautions Precautions: Fall;Knee Restrictions LLE Weight Bearing: Weight bearing as tolerated    Mobility  Bed Mobility Overal bed mobility: Needs Assistance Bed Mobility: Sit to Supine     Supine to sit: Min guard;HOB elevated Sit to supine: Mod assist   General bed mobility comments: verbal cues for technique, requiring assist for LEs due to fatigue and pain    Transfers Overall transfer level: Needs assistance Equipment used: Rolling walker (2 wheeled) Transfers: Sit to/from Stand Sit to Stand: Min assist;Mod assist Stand pivot transfers: Min assist       General transfer comment: verbal cues for UE and LE positioning, required 2 attempts to stand, required assist to control descent to bed due to fatigue and pain  Ambulation/Gait Ambulation/Gait assistance: Min assist Gait Distance (Feet): 24 Feet Assistive device: Rolling walker (2 wheeled) Gait Pattern/deviations: Step-to pattern;Decreased  stance time - left;Antalgic     General Gait Details: verbal cues for sequence, RW positioning, posture, very antalgic gait, great difficulty shifting weight and moving LEs, required standing rest break halfway; pt reports pain and mild dizziness affecting mobility; BP 126/76 mmHg, HR 106 bpm upon return to bed   Stairs             Wheelchair Mobility    Modified Rankin (Stroke Patients Only)       Balance                                            Cognition Arousal/Alertness: Awake/alert Behavior During Therapy: WFL for tasks assessed/performed Overall Cognitive Status: Within Functional Limits for tasks assessed                                        Exercises      General Comments        Pertinent Vitals/Pain Pain Assessment: 0-10 Pain Score: 7  (reports none at rest, increases with mobility) Pain Location: left knee Pain Descriptors / Indicators: Aching;Sore Pain Intervention(s): Repositioned;Monitored during session    Home Living                      Prior Function            PT Goals (current goals can now be  found in the care plan section) Progress towards PT goals: Progressing toward goals    Frequency    7X/week      PT Plan Current plan remains appropriate    Co-evaluation              AM-PAC PT "6 Clicks" Mobility   Outcome Measure  Help needed turning from your back to your side while in a flat bed without using bedrails?: A Little Help needed moving from lying on your back to sitting on the side of a flat bed without using bedrails?: A Lot Help needed moving to and from a bed to a chair (including a wheelchair)?: A Lot Help needed standing up from a chair using your arms (e.g., wheelchair or bedside chair)?: A Lot Help needed to walk in hospital room?: A Lot Help needed climbing 3-5 steps with a railing? : A Lot 6 Click Score: 13    End of Session Equipment Utilized During  Treatment: Gait belt;Left knee immobilizer Activity Tolerance: Patient limited by fatigue;Patient limited by pain Patient left: with call bell/phone within reach;in bed;with family/visitor present;with bed alarm set Nurse Communication: Mobility status PT Visit Diagnosis: Other abnormalities of gait and mobility (R26.89)     Time: 7353-2992 PT Time Calculation (min) (ACUTE ONLY): 27 min  Charges:  $Gait Training: 8-22 mins  $Self Care/Home Management: 8-22                     Jannette Spanner PT, DPT Acute Rehabilitation Services Pager: [phone] Office: [place] 01/18/2021, 3:12 PM

## 2021-01-19 ENCOUNTER — Inpatient Hospital Stay (HOSPITAL_COMMUNITY): Payer: Medicare Other

## 2021-01-19 DIAGNOSIS — N179 Acute kidney failure, unspecified: Secondary | ICD-10-CM | POA: Diagnosis not present

## 2021-01-19 LAB — CBC
HCT: 41.1 % (ref 39.0–52.0)
Hemoglobin: 13.8 g/dL (ref 13.0–17.0)
MCH: 32.1 pg (ref 26.0–34.0)
MCHC: 33.6 g/dL (ref 30.0–36.0)
MCV: 95.6 fL (ref 80.0–100.0)
Platelets: 172 10*3/uL (ref 150–400)
RBC: 4.3 MIL/uL (ref 4.22–5.81)
RDW: 13.7 % (ref 11.5–15.5)
WBC: 8.2 10*3/uL (ref 4.0–10.5)
nRBC: 0 % (ref 0.0–0.2)

## 2021-01-19 LAB — BASIC METABOLIC PANEL
Anion gap: 11 (ref 5–15)
BUN: 59 mg/dL — ABNORMAL HIGH (ref 8–23)
CO2: 22 mmol/L (ref 22–32)
Calcium: 9.3 mg/dL (ref 8.9–10.3)
Chloride: 98 mmol/L (ref 98–111)
Creatinine, Ser: 2.35 mg/dL — ABNORMAL HIGH (ref 0.61–1.24)
GFR, Estimated: 28 mL/min — ABNORMAL LOW (ref >60)
Glucose, Bld: 172 mg/dL — ABNORMAL HIGH (ref 70–99)
Potassium: 4.2 mmol/L (ref 3.5–5.1)
Sodium: 131 mmol/L — ABNORMAL LOW (ref 135–145)

## 2021-01-19 LAB — URINALYSIS, ROUTINE W REFLEX MICROSCOPIC
Bilirubin Urine: NEGATIVE
Glucose, UA: NEGATIVE mg/dL
Ketones, ur: NEGATIVE mg/dL
Leukocytes,Ua: NEGATIVE
Nitrite: NEGATIVE
Protein, ur: 100 mg/dL — AB
Specific Gravity, Urine: 1.018 (ref 1.005–1.030)
pH: 5 (ref 5.0–8.0)

## 2021-01-19 LAB — GLUCOSE, CAPILLARY
Glucose-Capillary: 160 mg/dL — ABNORMAL HIGH (ref 70–99)
Glucose-Capillary: 164 mg/dL — ABNORMAL HIGH (ref 70–99)
Glucose-Capillary: 145 mg/dL — ABNORMAL HIGH (ref 70–99)
Glucose-Capillary: 215 mg/dL — ABNORMAL HIGH (ref 70–99)

## 2021-01-19 MED ORDER — SODIUM CHLORIDE 0.9 % IV BOLUS
1000.0000 mL | Freq: Once | INTRAVENOUS | Status: AC
  Administered 2021-01-19: 1000 mL via INTRAVENOUS

## 2021-01-19 MED ORDER — SODIUM CHLORIDE 0.9 % IV SOLN: INTRAVENOUS | Status: DC

## 2021-01-19 MED ORDER — SODIUM CHLORIDE 0.9 % IV SOLN: Freq: Once | INTRAVENOUS | Status: DC

## 2021-01-19 MED ORDER — BISACODYL 10 MG RE SUPP
10.0000 mg | Freq: Every day | RECTAL | Status: DC | PRN
  Administered 2021-01-19: 10 mg via RECTAL
  Filled 2021-01-19: qty 1

## 2021-01-19 NOTE — Progress Notes (Signed)
Physical Therapy Treatment Patient Details Name: [name] MRN: [id number] DOB: [birth year] Today's Date: 01/19/2021    History of Present Illness Pt is a 75 year old male s/p left TKA on 01/16/21    PT Comments    Pt reports pain is tolerable today and able to ambulate same distance as yesterday however gait appearance improved.  Spouse and son present and observed.  Son feels he can now assist pt at home.  Discussed use of KI again as well as gait belt.  Pt will need RW and BSC for home.  Pt and family feel pt is ready for d/c once DME provided.   Follow Up Recommendations  Home health PT     Equipment Recommendations  Rolling walker with 5" wheels;Wheelchair (measurements PT)    Recommendations for Other Services       Precautions / Restrictions Precautions Precautions: Fall;Knee Restrictions LLE Weight Bearing: Weight bearing as tolerated    Mobility  Bed Mobility               General bed mobility comments: pt in recliner on arrival    Transfers Overall transfer level: Needs assistance Equipment used: Rolling walker (2 wheeled) Transfers: Sit to/from Stand Sit to Stand: Min guard         General transfer comment: verbal cues for UE and LE positioning, increased time and effort however able to stand without assist  Ambulation/Gait Ambulation/Gait assistance: Min guard Gait Distance (Feet): 24 Feet Assistive device: Rolling walker (2 wheeled) Gait Pattern/deviations: Step-to pattern;Decreased stance time - left;Antalgic     General Gait Details: verbal cues for sequence, RW positioning, posture; improved gait pattern compared to yesterday; pt reports pain is tolerable and denies dizziness; continues to fatigue quickly   Stairs             Wheelchair Mobility    Modified Rankin (Stroke Patients Only)       Balance                                            Cognition Arousal/Alertness: Awake/alert Behavior During  Therapy: WFL for tasks assessed/performed Overall Cognitive Status: Within Functional Limits for tasks assessed                                        Exercises      General Comments        Pertinent Vitals/Pain Pain Assessment: 0-10 Pain Score: 4  Pain Location: left knee Pain Descriptors / Indicators: Aching;Sore Pain Intervention(s): Repositioned;Monitored during session;Ice applied    Home Living                      Prior Function            PT Goals (current goals can now be found in the care plan section) Progress towards PT goals: Progressing toward goals    Frequency    7X/week      PT Plan Current plan remains appropriate    Co-evaluation              AM-PAC PT "6 Clicks" Mobility   Outcome Measure  Help needed turning from your back to your side while in a flat bed without using bedrails?: A Little Help needed moving from  lying on your back to sitting on the side of a flat bed without using bedrails?: A Little Help needed moving to and from a bed to a chair (including a wheelchair)?: A Little Help needed standing up from a chair using your arms (e.g., wheelchair or bedside chair)?: A Little Help needed to walk in hospital room?: A Little Help needed climbing 3-5 steps with a railing? : A Lot 6 Click Score: 17    End of Session Equipment Utilized During Treatment: Gait belt;Left knee immobilizer Activity Tolerance: Patient tolerated treatment well Patient left: in chair;with call bell/phone within reach;with family/visitor present;with chair alarm set Nurse Communication: Mobility status PT Visit Diagnosis: Other abnormalities of gait and mobility (R26.89)     Time: 1202-1222 PT Time Calculation (min) (ACUTE ONLY): 20 min  Charges:  $Gait Training: 8-22 mins                     Arlyce Dice, DPT Acute Rehabilitation Services Pager: [phone] Office: [phone]  [place] E 01/19/2021, 1:40 PM

## 2021-01-19 NOTE — Progress Notes (Signed)
Kevan Mcclung PA notified of patients desire to stay tonight.  Also of his complaint of constipation, continued elevated HR and low BP,  inability to urinate and bladder scan results.  Kevan ordered a suppository and cancelled the discharge. No addiional IV fluids ordered at this time.  Nurse will continue to encourage PO intake.

## 2021-01-19 NOTE — Progress Notes (Signed)
Subjective: 3 Days Post-Op Procedure(s) (LRB): TOTAL KNEE ARTHROPLASTY (Left) Patient reports pain as 4 on 0-10 scale.   Denies CP or SOB.  Voiding without difficulty. Positive flatus. Up in the chair. Objective: Vital signs in last 24 hours: Temp:  [97.8 F (36.6 C)-98.9 F (37.2 C)] 97.8 F (36.6 C) (05/14 0929) Pulse Rate:  [90-123] 123 (05/14 0929) Resp:  [16] 16 (05/14 0929) BP: (96-120)/(50-75) 96/57 (05/14 0929) SpO2:  [93 %-99 %] 93 % (05/14 0929)  Intake/Output from previous day: 05/13 0701 - 05/14 0700 In: 240 [P.O.:240] Out: 925 [Urine:925] Intake/Output this shift: No intake/output data recorded.  Recent Labs    01/17/21 0309 01/18/21 0339 01/19/21 0329  HGB 14.0 14.1 13.8   Recent Labs    01/18/21 0339 01/19/21 0329  WBC 9.3 8.2  RBC 4.40 4.30  HCT 42.0 41.1  PLT 149* 172   Recent Labs    01/17/21 0309  NA 137  K 4.8  CL 106  CO2 26  BUN 32*  CREATININE 1.40*  GLUCOSE 144*  CALCIUM 9.2   No results for input(s): LABPT, INR in the last 72 hours.  Neurologically intact Neurovascular intact Intact pulses distally Incision: dressing C/D/I  Minimal swelling. No DVT  Assessment/Plan:  3 Days Post-Op Procedure(s) (LRB): TOTAL KNEE ARTHROPLASTY (Left) Advance diet D/C IV fluids Discharge home with home health D/C instructions given  On his most recent vitals check he is a little bit tachycardic and mildly hypotensive.  We are going to give him a fluid bolus and hold some of his hypertensive medicines.  He has no subjective complaints at this time.  We also reviewed his labs which indicate that he has slightly anemic but not in any position that would require transfusion.  We will recheck his blood pressure after lunch.  Once this normalizes I think he would be safe to go home.    Active Problems:   S/P TKR (total knee replacement) using cement      Nicholes Stairs 01/19/2021, @NOW 

## 2021-01-19 NOTE — TOC Transition Note (Signed)
Transition of Care [facility]) - CM/SW Discharge Note   Patient Details  Name: Jamar Weatherall MRN: [id number] Date of Birth: 26-Sep-1945  Transition of Care [facility]) CM/SW Contact:  Leeroy Cha, RN Phone Number: 01/19/2021, 12:44 PM   Clinical Narrative:    dme rolling walker and 3 in 1 ordered through adapt dme at 1244   Final next level of care: [place] Barriers to Discharge: Barriers Resolved   Patient Goals and CMS Choice Patient states their goals for this hospitalization and ongoing recovery are:: short term SNF rehab and then home CMS Medicare.gov Compare Post Acute Care list provided to:: Patient Choice offered to / list presented to : Patient  Discharge Placement                       Discharge Plan and Services In-house Referral: Clinical Social Work   Post Acute Care Choice: [place]          DME Arranged: N/A DME Agency: NA       HH Arranged: PT [place] Agency: [facility] office) Date [place]: 01/18/21 Time [place]: 1000 Representative spoke with at [place]: [place] (ph# [phone]; fax # [phone])  Social Determinants of Health ([place]) Interventions     Readmission Risk Interventions Readmission Risk Prevention Plan 01/17/2021  Transportation Screening Complete  PCP or Specialist Appt within 5-7 Days Complete  Home Care Screening Complete  Some recent data might be hidden

## 2021-01-19 NOTE — Consult Note (Signed)
Medical Consultation  [name] KYH:[id number] DOB: [birth year] DOA: 01/16/2021 PCP: Bing Neighbors, NP   Requesting physician: Dr. [name] Date of consultation: 01/19/21 Reason for consultation: DM, hypotension  Impression/Recommendations Hypotension Dehydration Hx of HTN     - Hx of HTN; on metoprolol, lisinopril, HCTZ, aldactone currently     - given poor PO intake, relative hypotension (he feels symptomatic), and AKI; will hold his lisinopril, HCTZ, and aldactone today and give some fluids; follow I&O      AKI Hyponatremia     - check renal US     - fluids gently      - follow BMP  DM2     - continue SSI, glucose check, and diet as tolerated  Constipation Abdominal distention     - possible post-op constipation; KUB ordered, defer further w/u to primary team; notified Dr. [name] that KUB is awaiting read  Poor UOP Hx of BPH     - continue cardura     - follow bladder scans     - fluid challenge  TRH will follow-up again tomorrow. Please contact me if I can be of assistance in the meanwhile. Thank you for this consultation.  Chief Complaint: left knee pain  HPI:  [name] is a 75 y.o. male with medical history significant of HTN, DM, PAD, CAD, HLD, BPH. Presenting with left knee pain. Followed by orthopedics. Discussion of curative options resulted in patient agreeing to a total knee replacement. He presented to [facility] for this procedure and successfully completed it with the ortho team. He is now s/p TKR day 3. Over the last couple of days, the patient has reported increasing nausea and poor appetite. The orthopedic team has noted that he has some relative hypotension and poor urine output today. TRH was consulted for assistance in DM and HTN management.   Patient reports that his stomach feels distended with an increased amount of pressure making it uncomfortable to eat. He feels nauseous and is afraid to eat d/t the possibility of throwing up. He states that he has not  had a bowel movement since his procedure. He admits to not being mobile since his procedure. Family notes that he has not engaged sufficiently with PT d/t pain. The patient reports having a BM regimen started, but it has not as yet been successful. The patient does endorse a voiding today; although, it was a small amount.     Review of Systems:  Denies CP, palpitation, lightheadedness, dizziness, D, fever. Reports N, decreased UOP, constipation.   Past Medical History:  Diagnosis Date  . Cancer ([place]) 2011   lymphoma L leg  . Diabetes mellitus without complication ([place])   . GERD (gastroesophageal reflux disease)   . Hx of lymphoma   . Hyperlipidemia   . Hypertension   . Hypotestosteronemia   . Sleep apnea 2013   wears CPAP   Past Surgical History:  Procedure Laterality Date  . CARDIAC CATHETERIZATION     Dr. Sabra Heck  . RIGHT/LEFT HEART CATH AND CORONARY ANGIOGRAPHY N/A 01/02/2017   Procedure: Right/Left Heart Cath and Coronary Angiography;  Surgeon: Jolaine Artist, MD;  Location: [facility];  Service: Cardiovascular;  Laterality: N/A;  . TOTAL KNEE ARTHROPLASTY Left 01/16/2021   Procedure: TOTAL KNEE ARTHROPLASTY;  Surgeon: Susa Day, MD;  Location: [facility] ORS;  Service: Orthopedics;  Laterality: Left;  2.5 hrs General vs Spinal   Social History:  reports that he has never smoked. He has never used smokeless  tobacco. He reports that he does not drink alcohol and does not use drugs.  Allergies  Allergen Reactions  . Penicillins Hives and Other (See Comments)    "cillins" Has patient had a PCN reaction causing immediate rash, facial/tongue/throat swelling, SOB or lightheadedness with hypotension: unknown Has patient had a PCN reaction causing severe rash involving mucus membranes or skin necrosis: unknown Has patient had a PCN reaction that required hospitalization no Has patient had a PCN reaction occurring within the last 10 years:no If all of the above answers are  "NO", then may proceed with Cephalosporin use.    Family History  Problem Relation Age of Onset  . Heart disease Father   . Heart attack Father        x3  . Colon cancer Neg Hx   . Rectal cancer Neg Hx   . Stomach cancer Neg Hx   . Esophageal cancer Neg Hx     Prior to Admission medications   Medication Sig Start Date End Date Taking? Authorizing Provider  allopurinol (ZYLOPRIM) 100 MG tablet Take 100 mg by mouth at bedtime. 07/24/20  Yes [provider]  Ascorbic Acid (VITAMIN C) 1000 MG tablet Take 1,000 mg by mouth in the morning.   Yes [provider]  atorvastatin (LIPITOR) 20 MG tablet Take 20 mg by mouth every evening. 08/31/20  Yes [provider]  B Complex-Biotin-FA (VITAMIN B50 COMPLEX PO) Take 1 capsule by mouth in the morning.   Yes [provider]  Calcium Carb-Cholecalciferol (CALCIUM 600+D) 600-800 MG-UNIT TABS Take 1 tablet by mouth in the morning.   Yes [provider]  Cholecalciferol (VITAMIN D3) 10 MCG (400 UNIT) tablet Take 400 Units by mouth in the morning.   Yes [provider]  diclofenac Sodium (VOLTAREN) 1 % GEL Apply 1 application topically 4 (four) times daily as needed (knee pain).   Yes [provider]  doxazosin (CARDURA) 8 MG tablet Take 1 tablet (8 mg total) by mouth daily. Patient taking differently: Take 8 mg by mouth in the morning. 10/15/20  Yes Minna Merritts, MD  furosemide (LASIX) 20 MG tablet Take 1 tablet (20 mg total) by mouth daily as needed. Patient taking differently: Take 20 mg by mouth daily as needed (fluid retention/swelling). 08/14/20  Yes Gollan, Kathlene November, MD  hydrochlorothiazide (HYDRODIURIL) 25 MG tablet Take 1 tablet (25 mg total) by mouth daily. Patient taking differently: Take 25 mg by mouth in the morning. 08/14/20  Yes Gollan, Kathlene November, MD  ipratropium-albuterol (DUONEB) 0.5-2.5 (3) MG/3ML SOLN Inhale 3 mLs into the lungs every 6 (six) hours as needed (illness  (colds)/respiratory).   Yes [provider]  lisinopril (ZESTRIL) 40 MG tablet Take 1 tablet (40 mg total) by mouth at bedtime. 08/14/20  Yes Gollan, Kathlene November, MD  Menthol, Topical Analgesic, (BIOFREEZE EX) Apply 1 application topically 4 (four) times daily as needed (knee pain.).   Yes [provider]  metFORMIN (GLUCOPHAGE-XR) 500 MG 24 hr tablet Take 500 mg by mouth at bedtime. 02/20/20  Yes [provider]  metoprolol succinate (TOPROL-XL) 50 MG 24 hr tablet Take 1 tablet (50 mg total) by mouth daily. Take 1 tablet (50 mg) by mouth once daily in the morning. Take with or immediately following a meal. Patient taking differently: Take 50 mg by mouth in the morning. Take 1 tablet (50 mg) by mouth once daily in the morning. Take with or immediately following a meal. 08/14/20  Yes Gollan, Kathlene November,  MD  Multiple Vitamin (MULTIVITAMIN WITH MINERALS) TABS tablet Take 1 tablet by mouth in the morning. Centrum Silver   Yes [provider]  Omega-3 Fatty Acids (FISH OIL) 1200 MG CAPS Take 1,200 mg by mouth in the morning. W/600 mg omega-3   Yes [provider]  oxybutynin (DITROPAN) 5 MG tablet Take 5 mg by mouth in the morning.   Yes [provider]  pantoprazole (PROTONIX) 40 MG tablet Take 1 tablet (40 mg total) by mouth daily. Patient taking differently: Take 40 mg by mouth in the morning. 04/29/18  Yes Gollan, Kathlene November, MD  potassium chloride (KLOR-CON) 10 MEQ tablet Take 1 tablet (10 mEq total) by mouth daily as needed. 08/14/20  Yes Minna Merritts, MD  spironolactone (ALDACTONE) 25 MG tablet Take 1 tablet (25 mg total) by mouth at bedtime. 08/14/20  Yes Gollan, Kathlene November, MD  Testosterone Cypionate 200 MG/ML KIT Inject 200 mg into the muscle every 14 (fourteen) days.   Yes [provider]  vitamin E 400 UNIT capsule Take 400 Units by mouth in the morning.   Yes [provider]  aspirin 81 MG chewable tablet Chew 1 tablet (81 mg  total) by mouth 2 (two) times daily. 01/17/21   Cecilie Kicks, PA-C  docusate sodium (COLACE) 100 MG capsule Take 1 capsule (100 mg total) by mouth 2 (two) times daily as needed for mild constipation. 01/17/21   Cecilie Kicks, PA-C  metoprolol tartrate (LOPRESSOR) 25 MG tablet TAKE 1 TABLET BY MOUTH ONCE DAILY AT NIGHT Patient taking differently: Take 25 mg by mouth every evening. TAKE 1 TABLET BY MOUTH ONCE DAILY AT NIGHT 08/14/20   Minna Merritts, MD  oxyCODONE (OXY IR/ROXICODONE) 5 MG immediate release tablet Take 1 tablet (5 mg total) by mouth every 4 (four) hours as needed for moderate pain (pain score 4-6). 01/17/21   Cecilie Kicks, PA-C  OXYGEN Inhale 2.5 L into the lungs at bedtime.    [provider]  polyethylene glycol (MIRALAX / GLYCOLAX) 17 g packet Mix 17 g with 8 ounces of liquid and take by mouth daily as needed for mild constipation. 01/17/21   Cecilie Kicks, PA-C   Physical Exam: Blood pressure 102/61, pulse (!) 112, temperature 98.4 F (36.9 C), temperature source Oral, resp. rate 18, weight 102.5 kg, SpO2 92 %. Vitals:   01/19/21 1137 01/19/21 1333  BP: 108/63 102/61  Pulse: (!) 111 (!) 112  Resp: 20 18  Temp: 99.1 F (37.3 C) 98.4 F (36.9 C)  SpO2: 94% 92%    General: 75 y.o. male resting in bed in NAD Eyes: PERRL, normal sclera ENMT: Nares patent w/o discharge, orophaynx clear, dentition normal, ears w/o discharge/lesions/ulcers Neck: Supple, trachea midline Cardiovascular: RRR, +S1, S2, no m/g/r, equal pulses throughout Respiratory: CTABL, no w/r/r, normal WOB GI: BS hypoactive, distended but soft, global discomfort to palpation, no masses noted, no organomegaly noted MSK: No e/c/c Skin: No rashes, bruises, ulcerations noted Neuro: A&O x 3, no focal deficits Psyc: Appropriate interaction and affect, calm/cooperative  Labs on Admission:  Basic Metabolic Panel: Recent Labs  Lab 01/17/21 0309  NA 137  K 4.8  CL 106  CO2 26   GLUCOSE 144*  BUN 32*  CREATININE 1.40*  CALCIUM 9.2   Liver Function Tests: No results for input(s): AST, ALT, ALKPHOS, BILITOT, PROT, ALBUMIN in the last 168 hours. No results for input(s): LIPASE, AMYLASE in the last 168 hours. No results for input(s):  AMMONIA in the last 168 hours. CBC: Recent Labs  Lab 01/17/21 0309 01/18/21 0339 01/19/21 0329  WBC 8.8 9.3 8.2  HGB 14.0 14.1 13.8  HCT 41.2 42.0 41.1  MCV 96.0 95.5 95.6  PLT 151 149* 172   Cardiac Enzymes: No results for input(s): CKTOTAL, CKMB, CKMBINDEX, TROPONINI in the last 168 hours. BNP: Invalid input(s): POCBNP CBG: Recent Labs  Lab 01/18/21 1120 01/18/21 1659 01/18/21 2010 01/19/21 0727 01/19/21 1140  GLUCAP 152* 178* 147* 160* 164*    Radiological Exams on Admission: No results found.  Time spent: 60 minutes  [place] [facility]  If 7PM-7AM, please contact night-coverage [email or web address] 01/19/2021, 4:53 PM

## 2021-01-20 ENCOUNTER — Inpatient Hospital Stay (HOSPITAL_COMMUNITY): Payer: Medicare Other

## 2021-01-20 DIAGNOSIS — Z9989 Dependence on other enabling machines and devices: Secondary | ICD-10-CM

## 2021-01-20 DIAGNOSIS — E119 Type 2 diabetes mellitus without complications: Secondary | ICD-10-CM

## 2021-01-20 DIAGNOSIS — R579 Shock, unspecified: Secondary | ICD-10-CM | POA: Diagnosis not present

## 2021-01-20 DIAGNOSIS — K5900 Constipation, unspecified: Secondary | ICD-10-CM

## 2021-01-20 DIAGNOSIS — I959 Hypotension, unspecified: Secondary | ICD-10-CM

## 2021-01-20 DIAGNOSIS — G4733 Obstructive sleep apnea (adult) (pediatric): Secondary | ICD-10-CM

## 2021-01-20 DIAGNOSIS — Z96652 Presence of left artificial knee joint: Secondary | ICD-10-CM

## 2021-01-20 DIAGNOSIS — N400 Enlarged prostate without lower urinary tract symptoms: Secondary | ICD-10-CM

## 2021-01-20 DIAGNOSIS — N179 Acute kidney failure, unspecified: Secondary | ICD-10-CM | POA: Diagnosis not present

## 2021-01-20 DIAGNOSIS — R0603 Acute respiratory distress: Secondary | ICD-10-CM | POA: Diagnosis not present

## 2021-01-20 DIAGNOSIS — N171 Acute kidney failure with acute cortical necrosis: Secondary | ICD-10-CM | POA: Diagnosis not present

## 2021-01-20 LAB — BLOOD GAS, ARTERIAL
Acid-base deficit: 2.5 mmol/L — ABNORMAL HIGH (ref 0.0–2.0)
Acid-base deficit: 2.4 mmol/L — ABNORMAL HIGH (ref 0.0–2.0)
Bicarbonate: 22.8 mmol/L (ref 20.0–28.0)
Bicarbonate: 21.3 mmol/L (ref 20.0–28.0)
Drawn by: 560031
FIO2: 80
FIO2: 40
O2 Content: 15 L/min
O2 Saturation: 88 %
O2 Saturation: 93.2 %
Patient temperature: 98.6
Patient temperature: 98.6
pCO2 arterial: 43.5 mmHg (ref 32.0–48.0)
pCO2 arterial: 34.8 mmHg (ref 32.0–48.0)
pH, Arterial: 7.339 — ABNORMAL LOW (ref 7.350–7.450)
pH, Arterial: 7.403 (ref 7.350–7.450)
pO2, Arterial: 61.5 mmHg — ABNORMAL LOW (ref 83.0–108.0)
pO2, Arterial: 70.2 mmHg — ABNORMAL LOW (ref 83.0–108.0)

## 2021-01-20 LAB — D-DIMER, QUANTITATIVE: D-Dimer, Quant: 3.12 ug/mL-FEU — ABNORMAL HIGH (ref 0.00–0.50)

## 2021-01-20 LAB — CBC WITH DIFFERENTIAL/PLATELET
Abs Immature Granulocytes: 0.09 10*3/uL — ABNORMAL HIGH (ref 0.00–0.07)
Abs Immature Granulocytes: 0.15 10*3/uL — ABNORMAL HIGH (ref 0.00–0.07)
Basophils Absolute: 0 10*3/uL (ref 0.0–0.1)
Basophils Absolute: 0.1 10*3/uL (ref 0.0–0.1)
Basophils Relative: 0 %
Basophils Relative: 1 %
Eosinophils Absolute: 0 10*3/uL (ref 0.0–0.5)
Eosinophils Absolute: 0 10*3/uL (ref 0.0–0.5)
Eosinophils Relative: 0 %
Eosinophils Relative: 0 %
HCT: 42.6 % (ref 39.0–52.0)
HCT: 39.1 % (ref 39.0–52.0)
Hemoglobin: 14.4 g/dL (ref 13.0–17.0)
Hemoglobin: 13.4 g/dL (ref 13.0–17.0)
Immature Granulocytes: 1 %
Immature Granulocytes: 2 %
Lymphocytes Relative: 15 %
Lymphocytes Relative: 15 %
Lymphs Abs: 1 10*3/uL (ref 0.7–4.0)
Lymphs Abs: 1.1 10*3/uL (ref 0.7–4.0)
MCH: 32 pg (ref 26.0–34.0)
MCH: 32.3 pg (ref 26.0–34.0)
MCHC: 33.8 g/dL (ref 30.0–36.0)
MCHC: 34.3 g/dL (ref 30.0–36.0)
MCV: 94.7 fL (ref 80.0–100.0)
MCV: 94.2 fL (ref 80.0–100.0)
Monocytes Absolute: 0.9 10*3/uL (ref 0.1–1.0)
Monocytes Absolute: 0.9 10*3/uL (ref 0.1–1.0)
Monocytes Relative: 14 %
Monocytes Relative: 12 %
Neutro Abs: 4.6 10*3/uL (ref 1.7–7.7)
Neutro Abs: 5.2 10*3/uL (ref 1.7–7.7)
Neutrophils Relative %: 70 %
Neutrophils Relative %: 70 %
Platelets: 227 10*3/uL (ref 150–400)
Platelets: 223 10*3/uL (ref 150–400)
RBC: 4.5 MIL/uL (ref 4.22–5.81)
RBC: 4.15 MIL/uL — ABNORMAL LOW (ref 4.22–5.81)
RDW: 13.8 % (ref 11.5–15.5)
RDW: 14 % (ref 11.5–15.5)
WBC Morphology: ABNORMAL
WBC: 6.6 10*3/uL (ref 4.0–10.5)
WBC: 7.4 10*3/uL (ref 4.0–10.5)
nRBC: 0 % (ref 0.0–0.2)
nRBC: 0 % (ref 0.0–0.2)

## 2021-01-20 LAB — BASIC METABOLIC PANEL
Anion gap: 13 (ref 5–15)
BUN: 91 mg/dL — ABNORMAL HIGH (ref 8–23)
CO2: 21 mmol/L — ABNORMAL LOW (ref 22–32)
Calcium: 8.5 mg/dL — ABNORMAL LOW (ref 8.9–10.3)
Chloride: 97 mmol/L — ABNORMAL LOW (ref 98–111)
Creatinine, Ser: 3.53 mg/dL — ABNORMAL HIGH (ref 0.61–1.24)
GFR, Estimated: 17 mL/min — ABNORMAL LOW (ref >60)
Glucose, Bld: 240 mg/dL — ABNORMAL HIGH (ref 70–99)
Potassium: 4 mmol/L (ref 3.5–5.1)
Sodium: 131 mmol/L — ABNORMAL LOW (ref 135–145)

## 2021-01-20 LAB — CBC
HCT: 34.7 % — ABNORMAL LOW (ref 39.0–52.0)
Hemoglobin: 12 g/dL — ABNORMAL LOW (ref 13.0–17.0)
MCH: 32.3 pg (ref 26.0–34.0)
MCHC: 34.6 g/dL (ref 30.0–36.0)
MCV: 93.3 fL (ref 80.0–100.0)
Platelets: 226 10*3/uL (ref 150–400)
RBC: 3.72 MIL/uL — ABNORMAL LOW (ref 4.22–5.81)
RDW: 14.1 % (ref 11.5–15.5)
WBC: 8.1 10*3/uL (ref 4.0–10.5)
nRBC: 0 % (ref 0.0–0.2)

## 2021-01-20 LAB — RENAL FUNCTION PANEL
Albumin: 3.4 g/dL — ABNORMAL LOW (ref 3.5–5.0)
Anion gap: 13 (ref 5–15)
BUN: 79 mg/dL — ABNORMAL HIGH (ref 8–23)
CO2: 26 mmol/L (ref 22–32)
Calcium: 9.6 mg/dL (ref 8.9–10.3)
Chloride: 93 mmol/L — ABNORMAL LOW (ref 98–111)
Creatinine, Ser: 3.24 mg/dL — ABNORMAL HIGH (ref 0.61–1.24)
GFR, Estimated: 19 mL/min — ABNORMAL LOW (ref >60)
Glucose, Bld: 208 mg/dL — ABNORMAL HIGH (ref 70–99)
Phosphorus: 6 mg/dL — ABNORMAL HIGH (ref 2.5–4.6)
Potassium: 4.3 mmol/L (ref 3.5–5.1)
Sodium: 132 mmol/L — ABNORMAL LOW (ref 135–145)

## 2021-01-20 LAB — COMPREHENSIVE METABOLIC PANEL
ALT: 36 U/L (ref 0–44)
AST: 40 U/L (ref 15–41)
Albumin: 2.7 g/dL — ABNORMAL LOW (ref 3.5–5.0)
Alkaline Phosphatase: 53 U/L (ref 38–126)
Anion gap: 13 (ref 5–15)
BUN: 89 mg/dL — ABNORMAL HIGH (ref 8–23)
CO2: 24 mmol/L (ref 22–32)
Calcium: 9.2 mg/dL (ref 8.9–10.3)
Chloride: 94 mmol/L — ABNORMAL LOW (ref 98–111)
Creatinine, Ser: 4.01 mg/dL — ABNORMAL HIGH (ref 0.61–1.24)
GFR, Estimated: 15 mL/min — ABNORMAL LOW (ref >60)
Glucose, Bld: 197 mg/dL — ABNORMAL HIGH (ref 70–99)
Potassium: 4.5 mmol/L (ref 3.5–5.1)
Sodium: 131 mmol/L — ABNORMAL LOW (ref 135–145)
Total Bilirubin: 2 mg/dL — ABNORMAL HIGH (ref 0.3–1.2)
Total Protein: 5.8 g/dL — ABNORMAL LOW (ref 6.5–8.1)

## 2021-01-20 LAB — TROPONIN I (HIGH SENSITIVITY)
Troponin I (High Sensitivity): 21 ng/L — ABNORMAL HIGH (ref <18)
Troponin I (High Sensitivity): 23 ng/L — ABNORMAL HIGH (ref <18)

## 2021-01-20 LAB — LACTIC ACID, PLASMA
Lactic Acid, Venous: 1.7 mmol/L (ref 0.5–1.9)
Lactic Acid, Venous: 1.3 mmol/L (ref 0.5–1.9)

## 2021-01-20 LAB — SODIUM, URINE, RANDOM: Sodium, Ur: 41 mmol/L

## 2021-01-20 LAB — CREATININE, URINE, RANDOM: Creatinine, Urine: 88.81 mg/dL

## 2021-01-20 LAB — MAGNESIUM
Magnesium: 3.6 mg/dL — ABNORMAL HIGH (ref 1.7–2.4)
Magnesium: 4.1 mg/dL — ABNORMAL HIGH (ref 1.7–2.4)

## 2021-01-20 LAB — BRAIN NATRIURETIC PEPTIDE: B Natriuretic Peptide: 34.6 pg/mL (ref 0.0–100.0)

## 2021-01-20 LAB — GLUCOSE, CAPILLARY
Glucose-Capillary: 199 mg/dL — ABNORMAL HIGH (ref 70–99)
Glucose-Capillary: 189 mg/dL — ABNORMAL HIGH (ref 70–99)
Glucose-Capillary: 180 mg/dL — ABNORMAL HIGH (ref 70–99)
Glucose-Capillary: 187 mg/dL — ABNORMAL HIGH (ref 70–99)
Glucose-Capillary: 199 mg/dL — ABNORMAL HIGH (ref 70–99)
Glucose-Capillary: 219 mg/dL — ABNORMAL HIGH (ref 70–99)
Glucose-Capillary: 250 mg/dL — ABNORMAL HIGH (ref 70–99)

## 2021-01-20 LAB — BLOOD GAS, VENOUS
Acid-base deficit: 1.6 mmol/L (ref 0.0–2.0)
Bicarbonate: 20.9 mmol/L (ref 20.0–28.0)
FIO2: 21
O2 Saturation: 97.6 %
Patient temperature: 98.6
pCO2, Ven: 29.9 mmHg — ABNORMAL LOW (ref 44.0–60.0)
pH, Ven: 7.459 — ABNORMAL HIGH (ref 7.250–7.430)
pO2, Ven: 188 mmHg — ABNORMAL HIGH (ref 32.0–45.0)

## 2021-01-20 LAB — PROCALCITONIN: Procalcitonin: 4.2 ng/mL

## 2021-01-20 MED ORDER — ONDANSETRON HCL 4 MG/2ML IJ SOLN: 4.0000 mg | Freq: Four times a day (QID) | INTRAMUSCULAR | Status: DC | PRN

## 2021-01-20 MED ORDER — ALLOPURINOL 100 MG PO TABS
100.0000 mg | ORAL_TABLET | Freq: Every day | ORAL | Status: DC
  Administered 2021-01-23 – 2021-01-27 (×5): 100 mg
  Filled 2021-01-20 (×7): qty 1

## 2021-01-20 MED ORDER — INSULIN ASPART 100 UNIT/ML IJ SOLN
0.0000 [IU] | INTRAMUSCULAR | Status: DC
Start: 2021-01-21 — End: 2021-02-06
  Administered 2021-01-21 (×2): 3 [IU] via SUBCUTANEOUS
  Administered 2021-01-21: 5 [IU] via SUBCUTANEOUS
  Administered 2021-01-21: 2 [IU] via SUBCUTANEOUS
  Administered 2021-01-21 – 2021-01-22 (×4): 3 [IU] via SUBCUTANEOUS
  Administered 2021-01-22: 2 [IU] via SUBCUTANEOUS
  Administered 2021-01-22: 3 [IU] via SUBCUTANEOUS
  Administered 2021-01-23 (×3): 2 [IU] via SUBCUTANEOUS
  Administered 2021-01-23 – 2021-01-24 (×5): 3 [IU] via SUBCUTANEOUS
  Administered 2021-01-24 – 2021-01-25 (×2): 5 [IU] via SUBCUTANEOUS
  Administered 2021-01-25: 3 [IU] via SUBCUTANEOUS
  Administered 2021-01-25 (×3): 5 [IU] via SUBCUTANEOUS
  Administered 2021-01-25 – 2021-01-27 (×9): 3 [IU] via SUBCUTANEOUS
  Administered 2021-01-27: 5 [IU] via SUBCUTANEOUS
  Administered 2021-01-27: 2 [IU] via SUBCUTANEOUS
  Administered 2021-01-27: 5 [IU] via SUBCUTANEOUS
  Administered 2021-01-27 – 2021-01-29 (×10): 3 [IU] via SUBCUTANEOUS
  Administered 2021-01-29 (×2): 2 [IU] via SUBCUTANEOUS
  Administered 2021-01-29: 3 [IU] via SUBCUTANEOUS
  Administered 2021-01-29: 2 [IU] via SUBCUTANEOUS
  Administered 2021-01-30 (×2): 3 [IU] via SUBCUTANEOUS
  Administered 2021-01-30 (×2): 2 [IU] via SUBCUTANEOUS
  Administered 2021-01-30: 3 [IU] via SUBCUTANEOUS
  Administered 2021-01-31: 2 [IU] via SUBCUTANEOUS
  Administered 2021-01-31 – 2021-02-01 (×2): 3 [IU] via SUBCUTANEOUS
  Administered 2021-02-01 (×2): 2 [IU] via SUBCUTANEOUS
  Administered 2021-02-01: 3 [IU] via SUBCUTANEOUS
  Administered 2021-02-01 – 2021-02-02 (×6): 2 [IU] via SUBCUTANEOUS
  Administered 2021-02-02 (×2): 3 [IU] via SUBCUTANEOUS
  Administered 2021-02-02: 2 [IU] via SUBCUTANEOUS
  Administered 2021-02-03 (×3): 3 [IU] via SUBCUTANEOUS
  Administered 2021-02-03 – 2021-02-04 (×4): 2 [IU] via SUBCUTANEOUS
  Administered 2021-02-04: 3 [IU] via SUBCUTANEOUS
  Administered 2021-02-04 (×2): 2 [IU] via SUBCUTANEOUS
  Administered 2021-02-05: 3 [IU] via SUBCUTANEOUS
  Administered 2021-02-05: 2 [IU] via SUBCUTANEOUS
  Administered 2021-02-06 (×2): 3 [IU] via SUBCUTANEOUS

## 2021-01-20 MED ORDER — AMIODARONE HCL IN DEXTROSE 360-4.14 MG/200ML-% IV SOLN
30.0000 mg/h | INTRAVENOUS | Status: DC
  Administered 2021-01-21 – 2021-01-30 (×18): 30 mg/h via INTRAVENOUS
  Filled 2021-01-20 (×20): qty 200

## 2021-01-20 MED ORDER — CEFTRIAXONE SODIUM 2 G IJ SOLR
2.0000 g | INTRAMUSCULAR | Status: AC
Start: 2021-01-20 — End: 2021-01-25
  Administered 2021-01-20 – 2021-01-24 (×4): 2 g via INTRAVENOUS
  Filled 2021-01-20 (×4): qty 20
  Filled 2021-01-20 (×2): qty 2

## 2021-01-20 MED ORDER — VASOPRESSIN 20 UNITS/100 ML INFUSION FOR SHOCK
0.0000 [IU]/min | INTRAVENOUS | Status: DC
  Administered 2021-01-20 – 2021-01-21 (×2): 0.04 [IU]/min via INTRAVENOUS
  Administered 2021-01-21 – 2021-01-22 (×3): 0.03 [IU]/min via INTRAVENOUS
  Filled 2021-01-20 (×7): qty 100

## 2021-01-20 MED ORDER — SODIUM CHLORIDE 0.9 % IV BOLUS
500.0000 mL | Freq: Once | INTRAVENOUS | Status: AC
  Administered 2021-01-20: 500 mL via INTRAVENOUS

## 2021-01-20 MED ORDER — CALCIUM CARBONATE-VITAMIN D 500-200 MG-UNIT PO TABS
1.0000 | ORAL_TABLET | Freq: Every day | ORAL | Status: DC
  Filled 2021-01-20: qty 1

## 2021-01-20 MED ORDER — ASCORBIC ACID 500 MG PO TABS
1000.0000 mg | ORAL_TABLET | Freq: Every day | ORAL | Status: DC
  Filled 2021-01-20: qty 2

## 2021-01-20 MED ORDER — ALUM & MAG HYDROXIDE-SIMETH 200-200-20 MG/5ML PO SUSP: 30.0000 mL | ORAL | Status: DC | PRN

## 2021-01-20 MED ORDER — PANTOPRAZOLE SODIUM 40 MG IV SOLR
40.0000 mg | Freq: Every day | INTRAVENOUS | Status: DC
  Administered 2021-01-20 – 2021-01-24 (×5): 40 mg via INTRAVENOUS
  Filled 2021-01-20 (×4): qty 40

## 2021-01-20 MED ORDER — LEVALBUTEROL HCL 0.63 MG/3ML IN NEBU
0.6300 mg | INHALATION_SOLUTION | Freq: Four times a day (QID) | RESPIRATORY_TRACT | Status: DC
  Administered 2021-01-20: 0.63 mg via RESPIRATORY_TRACT
  Filled 2021-01-20: qty 3

## 2021-01-20 MED ORDER — ACETAMINOPHEN 325 MG PO TABS
325.0000 mg | ORAL_TABLET | Freq: Four times a day (QID) | ORAL | Status: DC | PRN
Start: 2021-01-20 — End: 2021-01-25

## 2021-01-20 MED ORDER — HEPARIN (PORCINE) 25000 UT/250ML-% IV SOLN
1500.0000 [IU]/h | INTRAVENOUS | Status: DC
  Filled 2021-01-20 (×2): qty 250

## 2021-01-20 MED ORDER — NOREPINEPHRINE 4 MG/250ML-% IV SOLN
INTRAVENOUS | Status: AC
  Administered 2021-01-20: 4 mg
  Filled 2021-01-20: qty 250

## 2021-01-20 MED ORDER — SENNOSIDES-DOCUSATE SODIUM 8.6-50 MG PO TABS: 1.0000 | ORAL_TABLET | Freq: Two times a day (BID) | ORAL | Status: DC

## 2021-01-20 MED ORDER — HEPARIN SODIUM (PORCINE) 5000 UNIT/ML IJ SOLN
5000.0000 [IU] | Freq: Three times a day (TID) | INTRAMUSCULAR | Status: DC
  Administered 2021-01-20 (×2): 5000 [IU] via SUBCUTANEOUS
  Filled 2021-01-20 (×2): qty 1

## 2021-01-20 MED ORDER — LEVALBUTEROL HCL 0.63 MG/3ML IN NEBU
INHALATION_SOLUTION | RESPIRATORY_TRACT | Status: AC
  Filled 2021-01-20: qty 3

## 2021-01-20 MED ORDER — CHLORHEXIDINE GLUCONATE CLOTH 2 % EX PADS
6.0000 | MEDICATED_PAD | Freq: Every day | CUTANEOUS | Status: DC
  Administered 2021-01-20 – 2021-02-05 (×16): 6 via TOPICAL

## 2021-01-20 MED ORDER — VANCOMYCIN VARIABLE DOSE PER UNSTABLE RENAL FUNCTION (PHARMACIST DOSING): Status: DC

## 2021-01-20 MED ORDER — HEPARIN (PORCINE) 25000 UT/250ML-% IV SOLN: 1500.0000 [IU]/h | INTRAVENOUS | Status: DC

## 2021-01-20 MED ORDER — METOCLOPRAMIDE HCL 5 MG/ML IJ SOLN
10.0000 mg | Freq: Four times a day (QID) | INTRAMUSCULAR | Status: DC
  Administered 2021-01-20 – 2021-01-27 (×31): 10 mg via INTRAVENOUS
  Filled 2021-01-20 (×32): qty 2

## 2021-01-20 MED ORDER — DIGOXIN 0.25 MG/ML IJ SOLN
0.1250 mg | Freq: Once | INTRAMUSCULAR | Status: AC
  Administered 2021-01-20: 0.125 mg via INTRAVENOUS
  Filled 2021-01-20: qty 2

## 2021-01-20 MED ORDER — VANCOMYCIN HCL 2000 MG/400ML IV SOLN
2000.0000 mg | Freq: Once | INTRAVENOUS | Status: AC
  Administered 2021-01-20: 2000 mg via INTRAVENOUS
  Filled 2021-01-20: qty 400

## 2021-01-20 MED ORDER — ONDANSETRON HCL 4 MG PO TABS: 4.0000 mg | ORAL_TABLET | Freq: Four times a day (QID) | ORAL | Status: DC | PRN

## 2021-01-20 MED ORDER — METHYLNALTREXONE BROMIDE 12 MG/0.6ML ~~LOC~~ SOLN: 12.0000 mg | Freq: Once | SUBCUTANEOUS | Status: DC

## 2021-01-20 MED ORDER — METHYLNALTREXONE BROMIDE 12 MG/0.6ML ~~LOC~~ SOLN
6.0000 mg | Freq: Once | SUBCUTANEOUS | Status: AC
  Administered 2021-01-20: 6 mg via SUBCUTANEOUS
  Filled 2021-01-20: qty 0.6

## 2021-01-20 MED ORDER — NOREPINEPHRINE 4 MG/250ML-% IV SOLN
0.0000 ug/min | INTRAVENOUS | Status: DC
  Administered 2021-01-20: 32 ug/min via INTRAVENOUS
  Administered 2021-01-21: 4 ug/min via INTRAVENOUS
  Administered 2021-01-21: 8 ug/min via INTRAVENOUS
  Administered 2021-01-22: 7 ug/min via INTRAVENOUS
  Filled 2021-01-20 (×5): qty 250

## 2021-01-20 MED ORDER — ALBUMIN HUMAN 25 % IV SOLN
25.0000 g | Freq: Four times a day (QID) | INTRAVENOUS | Status: AC
  Administered 2021-01-20 – 2021-01-21 (×4): 25 g via INTRAVENOUS
  Filled 2021-01-20 (×3): qty 100

## 2021-01-20 MED ORDER — CHOLECALCIFEROL 10 MCG (400 UNIT) PO TABS
400.0000 [IU] | ORAL_TABLET | Freq: Every day | ORAL | Status: DC
  Filled 2021-01-20: qty 1

## 2021-01-20 MED ORDER — LACTATED RINGERS IV SOLN: INTRAVENOUS | Status: AC

## 2021-01-20 MED ORDER — ADULT MULTIVITAMIN LIQUID CH
15.0000 mL | Freq: Every day | ORAL | Status: DC
  Filled 2021-01-20: qty 15

## 2021-01-20 MED ORDER — AMIODARONE HCL IN DEXTROSE 360-4.14 MG/200ML-% IV SOLN
60.0000 mg/h | INTRAVENOUS | Status: AC
  Administered 2021-01-20: 60 mg/h via INTRAVENOUS
  Filled 2021-01-20: qty 200

## 2021-01-20 MED ORDER — HYDROCORTISONE NA SUCCINATE PF 100 MG IJ SOLR
100.0000 mg | Freq: Two times a day (BID) | INTRAMUSCULAR | Status: DC
  Administered 2021-01-20 – 2021-01-25 (×10): 100 mg via INTRAVENOUS
  Filled 2021-01-20 (×10): qty 2

## 2021-01-20 MED ORDER — LACTATED RINGERS IV BOLUS
1000.0000 mL | Freq: Once | INTRAVENOUS | Status: AC
  Administered 2021-01-20: 1000 mL via INTRAVENOUS

## 2021-01-20 MED ORDER — AMIODARONE LOAD VIA INFUSION
150.0000 mg | Freq: Once | INTRAVENOUS | Status: AC
  Administered 2021-01-20: 150 mg via INTRAVENOUS
  Filled 2021-01-20: qty 83.34

## 2021-01-20 MED ORDER — HEPARIN (PORCINE) 25000 UT/250ML-% IV SOLN
1500.0000 [IU]/h | INTRAVENOUS | Status: DC
  Administered 2021-01-20: 1500 [IU]/h via INTRAVENOUS
  Filled 2021-01-20: qty 250

## 2021-01-20 MED ORDER — METOPROLOL TARTRATE 5 MG/5ML IV SOLN: 5.0000 mg | Freq: Four times a day (QID) | INTRAVENOUS | Status: DC

## 2021-01-20 MED ORDER — SODIUM CHLORIDE 0.9 % IV BOLUS
1000.0000 mL | Freq: Once | INTRAVENOUS | Status: AC
  Administered 2021-01-20: 1000 mL via INTRAVENOUS

## 2021-01-20 MED ORDER — ORAL CARE MOUTH RINSE
15.0000 mL | Freq: Two times a day (BID) | OROMUCOSAL | Status: DC
  Administered 2021-01-20 – 2021-02-06 (×32): 15 mL via OROMUCOSAL

## 2021-01-20 MED ORDER — IPRATROPIUM BROMIDE 0.02 % IN SOLN
0.5000 mg | Freq: Four times a day (QID) | RESPIRATORY_TRACT | Status: DC
  Administered 2021-01-20: 0.5 mg via RESPIRATORY_TRACT
  Filled 2021-01-20: qty 2.5

## 2021-01-20 MED ORDER — POLYETHYLENE GLYCOL 3350 17 G PO PACK: 17.0000 g | PACK | Freq: Every day | ORAL | Status: DC | PRN

## 2021-01-20 MED ORDER — LACTATED RINGERS IV BOLUS
250.0000 mL | Freq: Once | INTRAVENOUS | Status: AC
  Administered 2021-01-20: 250 mL via INTRAVENOUS

## 2021-01-20 MED ORDER — HEPARIN BOLUS VIA INFUSION
2650.0000 [IU] | INTRAVENOUS | Status: DC
  Filled 2021-01-20: qty 2650

## 2021-01-20 NOTE — Progress Notes (Signed)
Griffith Citron notified of pt transfer. She will notify Dr. Alvan Dame of this transfer.

## 2021-01-20 NOTE — Progress Notes (Signed)
Sent abg placed pt on 55% venturi mask pt is breathing out his mouth. His sats are now 91%. Pt is unable to wear cpap due to NG tube in place.

## 2021-01-20 NOTE — Progress Notes (Signed)
Pt now following commands and answering orientation questions appropriately but is very drowsy. HR jumping as high as 180's. EKGs obtained. Charge nurse Junie Panning and [employer] aware. Dr. Prudencio Burly assessing patient via camera. Pt denies chest pain, discomfort, or shortness of breath.

## 2021-01-20 NOTE — Progress Notes (Signed)
Pt now unresponsive, was previously following commands. [employer]Link notified and assessing via camera.CBG 219. Pt's WOB increasing.  Will continue to monitor.

## 2021-01-20 NOTE — Progress Notes (Addendum)
PROGRESS NOTE    [name]  GUR:[id number] DOB: 1946/05/11 DOA: 01/16/2021 PCP: Bing Neighbors, NP    No chief complaint on file.   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  Patient noted to have some nausea decreased appetite.  Patient noted to have relative hypotension with poor urine output and as such hospitalist team was consulted for assistance with diabetes and blood pressure management. Patient seen in consultation abdominal films done concerning for ileus.  Patient noted to be in acute kidney injury.  Diuretics of Lasix, spironolactone, ACE inhibitor, HCTZ discontinued and patient placed on IV fluids. Patient noted on 01/20/2021 to have worsening renal function, urine output not properly recorded, patient noted to be in acute respiratory distress, placed on nonrebreather, transferred to the stepdown unit for further evaluation.  PCCM consulted.   Assessment & Plan:   Active Problems:   S/P TKR (total knee replacement) using cement   Acute respiratory distress   AKI (acute kidney injury) ([place])   Acute renal failure (HCC)   Constipation   Benign prostatic hyperplasia   OSA (obstructive sleep apnea)   Hypotension   Type 2 diabetes mellitus without complication ([place])  #1 acute respiratory failure with hypoxia -Patient noted to go into acute respiratory failure with hypoxia with sats in the 80s on 6 L.  RN report.  Blood pressure also noted to be borderline with systolic in the high 28B.  Patient with some abdominal distention. -Patient status post recent left TKR postop day #4. -Differential concerning for acute PE versus ACS  -Transfer patient to stepdown unit.  Check lower extremity Dopplers, VQ scan, cycle cardiac enzymes, 2D echo,  comprehensive metabolic profile, CBC, magnesium level, ABG, chest x-ray, abdominal films.. -Neb treatments. -Place empirically on IV heparin pending lower extremity Doppler results of VQ scan. -Continue IV fluid resuscitation. -If worsening will need to place on the BiPAP. -Consulted PCCM for further evaluation and management.  2.  Acute renal failure -Patient noted on consultation to be in acute renal failure.  Baseline creatinine approximately 1.4 noted on admission 01/17/2021.  -Likely prerenal azotemia as patient noted to be hypotensive yesterday in the setting of diuretics, ACE inhibitor, recent surgery. -Renal ultrasound ordered this morning and pending. -Urine electrolytes ordered and pending. -Foley catheter placement ordered and pending. -Patient given a 1 L fluid bolus. -Continue IV fluids normal saline 125 cc an hour. -Strict I's and O's. -Daily weights. -Continue to hold diuretics and ACE inhibitor. -Avoid nephrotoxins. -If no improvement will need to consult with nephrology for further evaluation and management.  3.  Well-controlled type 2 diabetes mellitus -Hemoglobin A1c 5.7 (01/16/2021).  CBG 199. - Continue SSI.  4.  Hypotension -Blood pressure borderline. -See problem #1. -Check a CBC, c-Met, acute abdominal series, cardiac enzymes, chest x-ray.  IV fluids. -Diuretics and ACE inhibitor on hold. -On Toprol-XL and Lopressor which we will continue for now. -IV fluids. -If no improvement with hypotension may need to be placed on pressors.  5.  Constipation -Patient stated had bowel movement x2 yesterday. -Abdomen distended with hypoactive bowel sounds. -Check abdominal films. -Check electrolytes. -Change Colace to Senokot-S twice daily.  6.  History of BPH -Continue Cardura. -Renal ultrasound pending.  7.  Gastroesophageal reflux disease -PPI.  8.  Sleep apnea -CPAP nightly.  9.  Hypertension -Patient with  hypotension/borderline blood pressure.   Continue to hold antihypertensive medications.  10.  End-stage osteoarthritis left knee status post TKR -Per primary team.   Addendum: Chest x-ray and abdominal films done concerning for multifocal infiltrates in the lungs, ileus and partial small bowel obstruction. Patient seen by critical care, NG tube placed with 2 L output noted, patient placed empirically on IV antibiotics.  Patient also noted to have persistent hypotension and has been started on pressors.  DVT prophylaxis: Aspirin per primary Code Status: Full Family Communication: Updated son and wife at bedside. Disposition:   Status is: Inpatient    Dispo: The patient is from: Home              Anticipated d/c is to: TBD              Patient currently in acute respiratory distress, being transferred to the stepdown unit, hypotensive, not stable for discharge.   Difficult to place patient no       Consultants:   [facility] hospitalist: Dr. Marylyn Ishihara 01/19/2021  PCCM pending  Procedures:   Renal ultrasound pending 01/20/2021  Abdominal films 01/19/2021  Left total knee arthroplasty per Dr. [name] 01/17/2021  Antimicrobials:   None   Subjective: Patient laying in bed in acute respiratory distress.  Some complaints of shortness of breath.  Patient denies any chest pain.  Patient stated had 2 bowel movements.  Unable to determine how much urine output patient has had.  Objective: Vitals:   01/19/21 2132 01/20/21 0550 01/20/21 1200 01/20/21 1224  BP: 114/67 110/60 (!) 101/41   Pulse: (!) 105 98 (!) 115   Resp: 15 18    Temp: 97.7 F (36.5 C) 97.6 F (36.4 C)    TempSrc:      SpO2: 94% 94% 92% 95%  Weight:        Intake/Output Summary (Last 24 hours) at 01/20/2021 1302 Last data filed at 01/20/2021 0931 Gross per 24 hour  Intake 1040.12 ml  Output 200 ml  Net 840.12 ml   Filed Weights   01/16/21 0650  Weight: 102.5 kg    Examination:  General exam: In acute respiratory distress.  On  nonrebreather. Respiratory system: Decreased breath sounds in the bases.  Poor air movement.  Some intermittent expiratory wheezing.  Cardiovascular system: Tachycardic.  No murmurs rubs or gallops.  No JVD.  Left lower extremity with some edema postop.  Gastrointestinal system: Abdomen is distended, hypoactive bowel sounds, nontender to palpation, no rebound, no guarding.  Central nervous system: Drowsy.  Moving extremities spontaneously. Extremities: Symmetric 5 x 5 power. Skin: No rashes, lesions or ulcers Psychiatry: Judgement and insight appear normal. Mood & affect appropriate.     Data Reviewed: I have personally reviewed following labs and imaging studies  CBC: Recent Labs  Lab 01/17/21 0309 01/18/21 0339 01/19/21 0329 01/20/21 0401 01/20/21 1220  WBC 8.8 9.3 8.2 6.6 7.4  NEUTROABS  --   --   --  4.6 5.2  HGB 14.0 14.1 13.8 14.4 13.4  HCT 41.2 42.0 41.1 42.6 39.1  MCV 96.0 95.5 95.6 94.7 94.2  PLT 151 149* 172 227 924    Basic Metabolic Panel: Recent Labs  Lab 01/17/21 0309 01/19/21 1653 01/20/21 0401 01/20/21 1217  NA 137 131* 132* 131*  K 4.8 4.2 4.3 4.5  CL 106 98 93* 94*  CO2 _0 GLUCOSE 144* 172* 208* 197*  BUN 32* 59* 79* 89*  CREATININE 1.40* 2.35* 3.24* 4.01*  CALCIUM  9.2 9.3 9.6 9.2  MG  --   --  3.6*  --   PHOS  --   --  6.0*  --     GFR: Estimated Creatinine Clearance: 18.6 mL/min (A) (by C-G formula based on SCr of 4.01 mg/dL (H)).  Liver Function Tests: Recent Labs  Lab 01/20/21 0401 01/20/21 1217  AST  --  40  ALT  --  36  ALKPHOS  --  53  BILITOT  --  2.0*  PROT  --  5.8*  ALBUMIN 3.4* 2.7*    CBG: Recent Labs  Lab 01/19/21 1140 01/19/21 1729 01/19/21 2134 01/20/21 0808 01/20/21 1125  GLUCAP 164* 145* 215* 199* 189*     Recent Results (from the past 240 hour(s))  SARS CORONAVIRUS 2 (TAT 6-24 HRS) Nasopharyngeal Nasopharyngeal Swab     Status: None   Collection Time: 01/14/21  9:20 AM   Specimen:  Nasopharyngeal Swab  Result Value Ref Range Status   SARS Coronavirus 2 NEGATIVE NEGATIVE Final    Comment: (NOTE) SARS-CoV-2 target nucleic acids are NOT DETECTED.  The SARS-CoV-2 RNA is generally detectable in upper and lower respiratory specimens during the acute phase of infection. Negative results do not preclude SARS-CoV-2 infection, do not rule out co-infections with other pathogens, and should not be used as the sole basis for treatment or other patient management decisions. Negative results must be combined with clinical observations, patient history, and epidemiological information. The expected result is Negative.  Fact Sheet for Patients: SugarRoll.be  Fact Sheet for Healthcare Providers: https://www.woods-mathews.com/  This test is not yet approved or cleared by the [place] FDA and  has been authorized for detection and/or diagnosis of SARS-CoV-2 by FDA under an Emergency Use Authorization (EUA). This EUA will remain  in effect (meaning this test can be used) for the duration of the COVID-19 declaration under Se ction 564(b)(1) of the Act, 21 U.S.C. section 360bbb-3(b)(1), unless the authorization is terminated or revoked sooner.  Performed at [facility], [place] [address] [place], [place] [address]          Radiology Studies: DG Abd 1 View  Result Date: 01/19/2021 CLINICAL DATA:  Abdominal pain and constipation EXAM: ABDOMEN - 1 VIEW COMPARISON:  CT abdomen and pelvis March 05, 2020 FINDINGS: There is generalized bowel dilatation without appreciable air-fluid levels. No free air appreciable. Tiny pelvic calcifications likely represent phleboliths. IMPRESSION: Bowel dilatation without air-fluid levels. No free air evident on supine examination. Bowel-gas pattern is suggestive of ileus. Bowel obstruction felt to be less likely although not excluded. Electronically Signed   By: Lowella Grip III M.D.   On:  01/19/2021 19:49   US RENAL  Result Date: 01/20/2021 CLINICAL DATA:  Acute kidney injury EXAM: RENAL / URINARY TRACT ULTRASOUND COMPLETE COMPARISON:  03/05/2020 FINDINGS: Right Kidney: Renal measurements: 12.9 x 5.5 x 4.7 cm = volume: 175 ML. Upper pole cyst measures 4.6 x 4.5 x 3.4 cm. There is no hydronephrosis or mass. Normal parenchymal echogenicity. Left Kidney: Renal measurements: 12.4 by 5.4 x 3.2 cm = volume: 112.2 mL. Inferior pole cyst measures 4.3 x 4.2 x 3.7 cm. There is no mass or hydronephrosis. Normal parenchymal echogenicity. Bladder: Bladder appears collapsed. Other: None. IMPRESSION: 1. No acute findings.  No signs of obstructive uropathy. 2. Bilateral kidney cysts. Electronically Signed   By: Kerby Moors M.D.   On: 01/20/2021 09:19        Scheduled Meds: . allopurinol  100 mg Oral QHS  .  vitamin C  1,000 mg Oral Daily  . aspirin  81 mg Oral BID  . calcium-vitamin D  1 tablet Oral Daily  . cholecalciferol  400 Units Oral Daily  . doxazosin  8 mg Oral Daily  . heparin  2,650 Units Intravenous STAT  . insulin aspart  0-15 Units Subcutaneous TID WC  . ipratropium  0.5 mg Nebulization Q6H  . levalbuterol  0.63 mg Nebulization Q6H  . metoprolol succinate  50 mg Oral Daily  . metoprolol tartrate  25 mg Oral QPM  . multivitamin with minerals  1 tablet Oral Daily  . oxybutynin  5 mg Oral Daily  . pantoprazole (PROTONIX) IV  40 mg Intravenous Daily  . senna-docusate  1 tablet Oral BID   Continuous Infusions: . sodium chloride 125 mL/hr at 01/20/21 1020  . heparin    . methocarbamol (ROBAXIN) IV    . sodium chloride       LOS: 4 days    Time spent: 55 minutes.    Irine Seal, MD [facility]   To contact the attending provider between 7A-7P or the covering provider during after hours 7P-7A, please log into the web site [email or web address] and access using universal [place] password for that web site. If you do not have the password, please call the  hospital operator.  01/20/2021, 1:02 PM

## 2021-01-20 NOTE — Progress Notes (Signed)
ANTICOAGULATION CONSULT NOTE - Initial Consult  Pharmacy Consult for Heparin Indication: suspected pulmonary embolism  Allergies  Allergen Reactions  . Penicillins Hives and Other (See Comments)    "cillins" Has patient had a PCN reaction causing immediate rash, facial/tongue/throat swelling, SOB or lightheadedness with hypotension: unknown Has patient had a PCN reaction causing severe rash involving mucus membranes or skin necrosis: unknown Has patient had a PCN reaction that required hospitalization no Has patient had a PCN reaction occurring within the last 10 years:no If all of the above answers are "NO", then may proceed with Cephalosporin use.     Patient Measurements: Height: 5\' 8"  (172.7 cm) Weight: 104.4 kg (230 lb 2.6 oz) IBW/kg (Calculated) : 68.4 Heparin Dosing Weight: 91 kg  Vital Signs: Temp: 97.5 F (36.4 C) (05/15 2145) Temp Source: Oral (05/15 2145) BP: 110/51 (05/15 2230) Pulse Rate: 102 (05/15 2230)  Labs: Recent Labs    01/20/21 0401 01/20/21 1217 01/20/21 1220 01/20/21 1730 01/20/21 2131  HGB 14.4  --  13.4 12.0*  --   HCT 42.6  --  39.1 34.7*  --   PLT 227  --  223 226  --   CREATININE 3.24* 4.01*  --   --  3.53*  TROPONINIHS  --   --  21*  --  23*    Estimated Creatinine Clearance: 21.2 mL/min (A) (by C-G formula based on SCr of 3.53 mg/dL (H)).   Medical History: Past Medical History:  Diagnosis Date  . 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 Diabetes mellitus without complication ([place])   . GERD (gastroesophageal reflux disease)   . Hx of lymphoma   . Hyperlipidemia   . Hypertension   . Hypotestosteronemia   . Sleep apnea 2013   wears CPAP    Medications:   No oral anticoagulation PTA  Heparin 5000 units sq q8h for VTE prophylaxis started 01/20/2021 (last dose charted @ 23:00 on 5/15)  Assessment:  75 y.o. male known to pharmacy for Vancomycin dosing  This evening patient with new onset AFib with RVR.  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

## 2021-01-20 NOTE — Progress Notes (Signed)
Pharmacy Antibiotic Note  [name] is a 75 y.o. male presented to [facility] on 01/16/2021 for left TKA.  His condition started to worsen on 5/14. He became hypotensive, hypoxic, and had worsening renal function.  He transferred to the ICU on 5/15.  Pharmacy has been consulted to dose vancomycin for sepsis.  Today, 01/20/2021: - scr trending up 4.01 (crcl~18) - WBC 7.4 - on pressor  Plan: - vancomycin 2000 mg IV x1 - Will check level in a couple of days and redose if < 20 - ceftriaxone 2gm IV q24h per MD - monitor renal function closely ____________________________________________  Height: 5\' 8"  (172.7 cm) Weight: 104.4 kg (230 lb 2.6 oz) IBW/kg (Calculated) : 68.4  Temp (24hrs), Avg:98.3 F (36.8 C), Min:97.6 F (36.4 C), Max:99 F (37.2 C)  Recent Labs  Lab 01/17/21 0309 01/18/21 0339 01/19/21 0329 01/19/21 1653 01/20/21 0401 01/20/21 1217 01/20/21 1220  WBC 8.8 9.3 8.2  --  6.6  --  7.4  CREATININE 1.40*  --   --  2.35* 3.24* 4.01*  --     Estimated Creatinine Clearance: 18.6 mL/min (A) (by C-G formula based on SCr of 4.01 mg/dL (H)).    Allergies  Allergen Reactions  . Penicillins Hives and Other (See Comments)    "cillins" Has patient had a PCN reaction causing immediate rash, facial/tongue/throat swelling, SOB or lightheadedness with hypotension: unknown Has patient had a PCN reaction causing severe rash involving mucus membranes or skin necrosis: unknown Has patient had a PCN reaction that required hospitalization no Has patient had a PCN reaction occurring within the last 10 years:no If all of the above answers are "NO", then may proceed with Cephalosporin use.      Thank you for allowing pharmacy to be a part of this patient's care.  Lynelle Doctor 01/20/2021 5:24 PM

## 2021-01-20 NOTE — Progress Notes (Signed)
[place] Progress Note Patient Name: [name] DOB: [birth year] MRN: [id number]   Date of Service  01/20/2021  HPI/Events of Note  Troponin not doubling. D dimer elevated. Cr > 4. Making good urine.  Camera: Second dose dig did not help as it did first tine to sinus.  Secure chat with Dr Einar Grad done.  eICU Interventions  Agrees for amiodarone bolus and gtt and heparin gtt for suspected PE. Consider V/Q scan in AM. Venous duplex legs. Left knee TKR dressing looks fine. No hematoma.  Ortho to follow through in AM. Echo in am.      Intervention Category Intermediate Interventions: Diagnostic test evaluation;Respiratory distress - evaluation and management  Elmer Sow 01/20/2021, 11:20 PM

## 2021-01-20 NOTE — Consult Note (Signed)
NAME:  [name], MRN:  [id number], DOB:  1946/04/22, LOS: 4 ADMISSION DATE:  01/16/2021, CONSULTATION DATE:  01/20/21 REFERRING MD:  Grandville Silos, CHIEF COMPLAINT:  SOB   History of Present Illness:  75 year old man with hx of SBO presenting for TKR complicated by ileus and now respiratory failure.  Per family (patient pretty out of it working to breathe), patient has been having slowly worsening respiratory status over last 24h.  Renal function also deteriorating.  As hypoxemia has worsened patient is being sent to unit with PCCM eval.  Pertinent  Medical History  OSA HTN HLD GERD DM  Significant Hospital Events: Including procedures, antibiotic start and stop dates in addition to other pertinent events   . 5/12 TKR  Interim History / Subjective:  consulted  Objective   Blood pressure (!) 125/96, pulse (!) 119, temperature 98.7 F (37.1 C), temperature source Axillary, resp. rate (!) 40, weight 102.5 kg, SpO2 91 %.        Intake/Output Summary (Last 24 hours) at 01/20/2021 1314 Last data filed at 01/20/2021 0931 Gross per 24 hour  Intake 1040.12 ml  Output 200 ml  Net 840.12 ml   Filed Weights   01/16/21 0650  Weight: 102.5 kg    Examination: Constitutional: ill appearing man tahchypneic  Eyes: EOMI, pupils equal Ears, nose, mouth, and throat: MM dry, NRB in place Cardiovascular: tachycardic, ext warm Respiratory: shallow inspiratory efforts, tachypneic Gastrointestinal: protuberant, tympanic, hypoactive to percussion Skin: No rashes, normal turgor,  Neurologic: moves all 4 ext, very hard of hearing Psychiatric: RASS -1   Labs/imaging that I havepersonally reviewed  (right click and "Reselect all SmartList Selections" daily)  BNP normal Trop normal CXR aspiration, ileus  Resolved Hospital Problem list   n/a  Assessment & Plan:  Acute hypoxemic respiratory failure due to ileus with aspiration pneumonitis and atelectasis OSA on CPAP S/p TKR Acute kidney  injury related to poor PO, relative hypotension and losses with ileus  - LR: 1L bolus and 125cc/hr, avoid nephrotoxins - Avoid CPAP or BIPAP; he will desat intermittently at night due to his OSA - NGT to LIS, start reglan and give dose of relistor - Heparin subQ, check LE duplex, no need for VQ scan, clinical history/exam and CXR explain presentation - Low threshold for intubation - Zosyn x 5 days - Family updated at bedside  Best practice (right click and "Reselect all SmartList Selections" daily)  Diet:  NPO Pain/Anxiety/Delirium protocol (if indicated): No VAP protocol (if indicated): Not indicated DVT prophylaxis: Subcutaneous Heparin GI prophylaxis: N/A Glucose control:  SSI No Central venous access:  N/A Arterial line:  N/A Foley:  Yes, and it is still needed Mobility:  bed rest  PT consulted: Yes Last date of multidisciplinary goals of care discussion [n/a] Code Status:  full code Disposition: ICU pending improvement in respiratory status  Labs   CBC: Recent Labs  Lab 01/17/21 0309 01/18/21 0339 01/19/21 0329 01/20/21 0401 01/20/21 1220  WBC 8.8 9.3 8.2 6.6 7.4  NEUTROABS  --   --   --  4.6 5.2  HGB 14.0 14.1 13.8 14.4 13.4  HCT 41.2 42.0 41.1 42.6 39.1  MCV 96.0 95.5 95.6 94.7 94.2  PLT 151 149* 172 227 628    Basic Metabolic Panel: Recent Labs  Lab 01/17/21 0309 01/19/21 1653 01/20/21 0401 01/20/21 1217 01/20/21 1220  NA 137 131* 132* 131*  --   K 4.8 4.2 4.3 4.5  --   CL 106 98 93* 94*  --  CO2 26 22 26 24  --   GLUCOSE 144* 172* 208* 197*  --   BUN 32* 59* 79* 89*  --   CREATININE 1.40* 2.35* 3.24* 4.01*  --   CALCIUM 9.2 9.3 9.6 9.2  --   MG  --   --  3.6*  --  4.1*  PHOS  --   --  6.0*  --   --    GFR: Estimated Creatinine Clearance: 18.6 mL/min (A) (by C-G formula based on SCr of 4.01 mg/dL (H)). Recent Labs  Lab 01/18/21 0339 01/19/21 0329 01/20/21 0401 01/20/21 1220  WBC 9.3 8.2 6.6 7.4    Liver Function Tests: Recent Labs   Lab 01/20/21 0401 01/20/21 1217  AST  --  40  ALT  --  36  ALKPHOS  --  53  BILITOT  --  2.0*  PROT  --  5.8*  ALBUMIN 3.4* 2.7*   No results for input(s): LIPASE, AMYLASE in the last 168 hours. No results for input(s): AMMONIA in the last 168 hours.  ABG    Component Value Date/Time   PHART 7.339 (L) 01/20/2021 1202   PCO2ART 43.5 01/20/2021 1202   PO2ART 61.5 (L) 01/20/2021 1202   HCO3 22.8 01/20/2021 1202   TCO2 26 01/02/2017 1303   ACIDBASEDEF 2.5 (H) 01/20/2021 1202   O2SAT 88.0 01/20/2021 1202     Coagulation Profile: No results for input(s): INR, PROTIME in the last 168 hours.  Cardiac Enzymes: No results for input(s): CKTOTAL, CKMB, CKMBINDEX, TROPONINI in the last 168 hours.  HbA1C: Hgb A1c MFr Bld  Date/Time Value Ref Range Status  01/16/2021 01:52 PM 5.7 (H) 4.8 - 5.6 % Final    Comment:    (NOTE) Pre diabetes:          5.7%-6.4%  Diabetes:              >6.4%  Glycemic control for   <7.0% adults with diabetes   04/29/2018 10:12 AM 5.4 4.8 - 5.6 % Final    Comment:    (NOTE) Pre diabetes:          5.7%-6.4% Diabetes:              >6.4% Glycemic control for   <7.0% adults with diabetes     CBG: Recent Labs  Lab 01/19/21 1140 01/19/21 1729 01/19/21 2134 01/20/21 0808 01/20/21 1125  GLUCAP 164* 145* 215* 199* 189*    Review of Systems:    Positive Symptoms in bold:  Positive Symptoms in bold:  Constitutional fevers, chills, weight loss, fatigue, anorexia, malaise  Eyes decreased vision, double vision, eye irritation  Ears, Nose, Mouth, Throat sore throat, trouble swallowing, sinus congestion  Cardiovascular chest pain, paroxysmal nocturnal dyspnea, lower ext edema, palpitations   Respiratory SOB, cough, DOE, hemoptysis, wheezing  Gastrointestinal nausea, vomiting, diarrhea  Genitourinary burning with urination, trouble urinating  Musculoskeletal joint aches, joint swelling, back pain  Integumentary  rashes, skin lesions   Neurological focal weakness, focal numbness, trouble speaking, headaches  Psychiatric depression, anxiety, confusion  Endocrine polyuria, polydipsia, cold intolerance, heat intolerance  Hematologic abnormal bruising, abnormal bleeding, unexplained nose bleeds  Allergic/Immunologic recurrent infections, hives, swollen lymph nodes     Past Medical History:  He,  has a past medical history of Cancer (HCC) (2011), Diabetes mellitus without complication (HCC), GERD (gastroesophageal reflux disease), lymphoma, Hyperlipidemia, Hypertension, Hypotestosteronemia, and Sleep apnea (2013).   Surgical History:   Past Surgical History:  Procedure Laterality Date  . CARDIAC   CATHETERIZATION     Dr. Miller  . RIGHT/LEFT HEART CATH AND CORONARY ANGIOGRAPHY N/A 01/02/2017   Procedure: Right/Left Heart Cath and Coronary Angiography;  Surgeon: Daniel R Bensimhon, MD;  Location: [facility]AS[facility];  Service: Cardiovascular;  Laterality: N/A;  . TOTAL KNEE ARTHROPLASTY Left 01/16/2021   Procedure: TOTAL KNEE ARTHROPLASTY;  Surgeon: Beane, Jeffrey, MD;  Location: [facility];  Service: Orthopedics;  Laterality: Left;  2.5 hrs General vs Spinal     Social History:   reports that he has never smoked. He has never used smokeless tobacco. He reports that he does not drink alcohol and does not use drugs.   Family History:  His family history includes Heart attack in his father; Heart disease in his father. There is no history of Colon cancer, Rectal cancer, Stomach cancer, or Esophageal cancer.   Allergies Allergies  Allergen Reactions  . Penicillins Hives and Other (See Comments)    "cillins" Has patient had a PCN reaction causing immediate rash, facial/tongue/throat swelling, SOB or lightheadedness with hypotension: unknown Has patient had a PCN reaction causing severe rash involving mucus membranes or skin necrosis: unknown Has patient had a PCN reaction that required hospitalization no Has patient had a PCN  reaction occurring within the last 10 years:no If all of the above answers are "NO", then may proceed with Cephalosporin use.      Home Medications  Prior to Admission medications   Medication Sig Start Date End Date Taking? Authorizing Provider  allopurinol (ZYLOPRIM) 100 MG tablet Take 100 mg by mouth at bedtime. 07/24/20  Yes [provider]  Ascorbic Acid (VITAMIN C) 1000 MG tablet Take 1,000 mg by mouth in the morning.   Yes [provider]  atorvastatin (LIPITOR) 20 MG tablet Take 20 mg by mouth every evening. 08/31/20  Yes [provider]  B Complex-Biotin-FA (VITAMIN B50 COMPLEX PO) Take 1 capsule by mouth in the morning.   Yes [provider]  Calcium Carb-Cholecalciferol (CALCIUM 600+D) 600-800 MG-UNIT TABS Take 1 tablet by mouth in the morning.   Yes [provider]  Cholecalciferol (VITAMIN D3) 10 MCG (400 UNIT) tablet Take 400 Units by mouth in the morning.   Yes [provider]  diclofenac Sodium (VOLTAREN) 1 % GEL Apply 1 application topically 4 (four) times daily as needed (knee pain).   Yes [provider]  doxazosin (CARDURA) 8 MG tablet Take 1 tablet (8 mg total) by mouth daily. Patient taking differently: Take 8 mg by mouth in the morning. 10/15/20  Yes Gollan, Timothy J, MD  furosemide (LASIX) 20 MG tablet Take 1 tablet (20 mg total) by mouth daily as needed. Patient taking differently: Take 20 mg by mouth daily as needed (fluid retention/swelling). 08/14/20  Yes Gollan, Timothy J, MD  hydrochlorothiazide (HYDRODIURIL) 25 MG tablet Take 1 tablet (25 mg total) by mouth daily. Patient taking differently: Take 25 mg by mouth in the morning. 08/14/20  Yes Gollan, Timothy J, MD  ipratropium-albuterol (DUONEB) 0.5-2.5 (3) MG/3ML SOLN Inhale 3 mLs into the lungs every 6 (six) hours as needed (illness (colds)/respiratory).   Yes [provider]  lisinopril (ZESTRIL) 40 MG tablet Take 1 tablet (40 mg total) by mouth  at bedtime. 08/14/20  Yes Gollan, Timothy J, MD  Menthol, Topical Analgesic, (BIOFREEZE EX) Apply 1 application topically 4 (four) times daily as needed (knee pain.).   Yes [provider]  metFORMIN (GLUCOPHAGE-XR) 500 MG 24 hr tablet Take 500 mg by mouth at bedtime.   02/20/20  Yes [provider]  metoprolol succinate (TOPROL-XL) 50 MG 24 hr tablet Take 1 tablet (50 mg total) by mouth daily. Take 1 tablet (50 mg) by mouth once daily in the morning. Take with or immediately following a meal. Patient taking differently: Take 50 mg by mouth in the morning. Take 1 tablet (50 mg) by mouth once daily in the morning. Take with or immediately following a meal. 08/14/20  Yes Gollan, Timothy J, MD  Multiple Vitamin (MULTIVITAMIN WITH MINERALS) TABS tablet Take 1 tablet by mouth in the morning. Centrum Silver   Yes [provider]  Omega-3 Fatty Acids (FISH OIL) 1200 MG CAPS Take 1,200 mg by mouth in the morning. W/600 mg omega-3   Yes [provider]  oxybutynin (DITROPAN) 5 MG tablet Take 5 mg by mouth in the morning.   Yes [provider]  pantoprazole (PROTONIX) 40 MG tablet Take 1 tablet (40 mg total) by mouth daily. Patient taking differently: Take 40 mg by mouth in the morning. 04/29/18  Yes Gollan, Timothy J, MD  potassium chloride (KLOR-CON) 10 MEQ tablet Take 1 tablet (10 mEq total) by mouth daily as needed. 08/14/20  Yes Gollan, Timothy J, MD  spironolactone (ALDACTONE) 25 MG tablet Take 1 tablet (25 mg total) by mouth at bedtime. 08/14/20  Yes Gollan, Timothy J, MD  Testosterone Cypionate 200 MG/ML KIT Inject 200 mg into the muscle every 14 (fourteen) days.   Yes [provider]  vitamin E 400 UNIT capsule Take 400 Units by mouth in the morning.   Yes [provider]  aspirin 81 MG chewable tablet Chew 1 tablet (81 mg total) by mouth 2 (two) times daily. 01/17/21   Bissell, Jaclyn M, PA-C  docusate sodium (COLACE) 100 MG capsule Take 1 capsule  (100 mg total) by mouth 2 (two) times daily as needed for mild constipation. 01/17/21   Bissell, Jaclyn M, PA-C  metoprolol tartrate (LOPRESSOR) 25 MG tablet TAKE 1 TABLET BY MOUTH ONCE DAILY AT NIGHT Patient taking differently: Take 25 mg by mouth every evening. TAKE 1 TABLET BY MOUTH ONCE DAILY AT NIGHT 08/14/20   Gollan, Timothy J, MD  oxyCODONE (OXY IR/ROXICODONE) 5 MG immediate release tablet Take 1 tablet (5 mg total) by mouth every 4 (four) hours as needed for moderate pain (pain score 4-6). 01/17/21   Bissell, Jaclyn M, PA-C  OXYGEN Inhale 2.5 L into the lungs at bedtime.    [provider]  polyethylene glycol (MIRALAX / GLYCOLAX) 17 g packet Mix 17 g with 8 ounces of liquid and take by mouth daily as needed for mild constipation. 01/17/21   Bissell, Jaclyn M, PA-C     Critical care time: 34 minutes not including any separately billable procedures        

## 2021-01-20 NOTE — Progress Notes (Signed)
Patient ID: [name], male   DOB: [birth year], 75 y.o.   MRN: [id number] Subjective: 4 Days Post-Op Procedure(s) (LRB): TOTAL KNEE ARTHROPLASTY (Left)    Patient reports pain as mild. Able to have bowels move yesterday Feels he is ready to go home today  Objective:   VITALS:   Vitals:   01/19/21 2132 01/20/21 0550  BP: 114/67 110/60  Pulse: (!) 105 98  Resp: 15 18  Temp: 97.7 F (36.5 C) 97.6 F (36.4 C)  SpO2: 94% 94%    Neurovascular intact Incision: dressing C/D/I - left knee ?baseline short interval breathing Uses CPAP at home at night  LABS Recent Labs    01/18/21 0339 01/19/21 0329 01/20/21 0401  HGB 14.1 13.8 14.4  HCT 42.0 41.1 42.6  WBC 9.3 8.2 6.6  PLT 149* 172 227    Recent Labs    01/19/21 1653 01/20/21 0401  NA 131* 132*  K 4.2 4.3  BUN 59* 79*  CREATININE 2.35* 3.24*  GLUCOSE 172* 208*    No results for input(s): LABPT, INR in the last 72 hours.   Assessment/Plan: 4 Days Post-Op Procedure(s) (LRB): TOTAL KNEE ARTHROPLASTY (Left)   Up with therapy  Should be able to home today after am therapy Will make sure meds sent to pharmacy including bowel regiment recommendations RTC in 2 weeks to see Beane

## 2021-01-20 NOTE — Progress Notes (Signed)
~  2L out with NGT Breathing improved but BP dropped question loss of adrenergic tone associated with discomfort and respiratory distress. Still appears dry Giving fluids, pressors. Starting rocephin due to PCN allergy. Family at bedside updated, also spoke with Dr. [name] over phone.  Erskine Emery MD PCCM

## 2021-01-20 NOTE — Progress Notes (Signed)
[facility] Progress Note Patient Name: [name] DOB: 06-14-1946 MRN: [id number]   Date of Service  01/20/2021  HPI/Events of Note  back in afib/RVR 157  99/59      bedside RN reported did well after the dig  eICU Interventions  Discussed with RN. Re dig IV push. Making good urine/AKI. Trop/d dimer still pending to report as well as BMP. RN calling labs.      Intervention Category Intermediate Interventions: Arrhythmia - evaluation and management  Elmer Sow 01/20/2021, 10:51 PM

## 2021-01-20 NOTE — Progress Notes (Signed)
Feels/looks better but high pressor needs Plan for 2.5L crystalloid, 100g 25% albumin Add vasopressin, stress steroids, vanc

## 2021-01-20 NOTE — Progress Notes (Signed)
Patient became short of breath and RR was called. Patient was transferred to 1231.

## 2021-01-20 NOTE — Progress Notes (Addendum)
[facility] Progress Note Patient Name: Calton Harshfield DOB: 1945-10-17 MRN: [id number]   Date of Service  01/20/2021  HPI/Events of Note  RN called with concern for decrease responsiveness, RN was able to wake pt up, groggy but MAE x 4, responding just a little slow, CBG 219,  I have asked RN to connect a CVP  if you could write orders for that, we also need to changed CBG's to q 4 with SSI, pt is NPO with NG to sx due to ileus    92% sat on HFNC 6 L               HR just now up to 152 wtih CVP 18-20  Camera: Discussed with bed side RN. On Levo and Vaso, 6 lit o2. sats 91%. New onset a fib RVR, goes in to sinus 130's to 170. MAP > 70. Making urine. Mentation was down, now following commands. Denies any chest pain or sob. Obese.  A/P:  New onset a fib RVR. S/p TKR on 11 th on sq heparin. Earlier shifted to ICU for AHRF-rapid response and septic shock from LLL pneumonia, elevated pro calcitonin.  Has hx of OSA/HTN diastolic chf 9629 echo. BNP low. Trop was at 21.   eICU Interventions  - stat labs , LR 250 ( got 3 lit bolus earlier in day), dig 0.125 mg IV ( Cr > 4, non oliguric, K was fine). - asp precautions. - PE possible, but has alternate reason for AHRF-Pneumonia/elevated pro cal). D dimer could come false + also.  Will consider heparin gtt if worsening. CTA not possible due to AKI. Might need to go on amiodarone if not better, or lytes and co2 with in  Normal limits.  No beta bockers due to pressor requirement. No hypoglycemeia or fever or in pain at this time.      Intervention Category Major Interventions: Other:;Change in mental status - evaluation and management;Hyperglycemia - active titration of insulin therapy  Elmer Sow 01/20/2021, 9:14 PM   22:21 No hypercarbia on ABG. PH 7.40 RT notes: pt on 55% venturi mask pt is breathing out his mouth. His sats are now 91%. Pt is unable to wear cpap due to NG tube  Place. Camera re eval done. On . sats ok.HR 103,  106/51.

## 2021-01-20 NOTE — Procedures (Signed)
Central Venous Catheter Insertion Procedure Note  [name]  [id number]  01-08-1946  Date:01/20/21  Time:2:51 PM   Provider Performing:Raevon Broom Cipriano Mile   Procedure: Insertion of Non-tunneled Central Venous Catheter(36556) with US guidance (48185)   Indication(s) Medication administration  Consent Unable to obtain consent due to emergent nature of procedure. Son at bedside (Advertising copywriter at [employer]) agreed verbally.  Anesthesia Topical only with 1% lidocaine   Timeout Verified patient identification, verified procedure, site/side was marked, verified correct patient position, special equipment/implants available, medications/allergies/relevant history reviewed, required imaging and test results available.  Sterile Technique Maximal sterile technique including full sterile barrier drape, hand hygiene, sterile gown, sterile gloves, mask, hair covering, sterile ultrasound probe cover (if used).  Procedure Description Area of catheter insertion was cleaned with chlorhexidine and draped in sterile fashion.  With real-time ultrasound guidance a central venous catheter was placed into the left internal jugular vein. Nonpulsatile blood flow and easy flushing noted in all ports.  The catheter was sutured in place and sterile dressing applied.  Complications/Tolerance None; patient tolerated the procedure well. Chest X-ray is ordered to verify placement for internal jugular or subclavian cannulation.   Chest x-ray is not ordered for femoral cannulation.  EBL Minimal  Specimen(s) None

## 2021-01-20 NOTE — Plan of Care (Signed)
  Problem: Activity: Goal: Risk for activity intolerance will decrease Outcome: Progressing   Problem: Elimination: Goal: Will not experience complications related to bowel motility Outcome: Progressing   Problem: Pain Managment: Goal: General experience of comfort will improve Outcome: Progressing   Problem: Safety: Goal: Ability to remain free from injury will improve Outcome: Progressing   

## 2021-01-21 ENCOUNTER — Inpatient Hospital Stay (HOSPITAL_COMMUNITY): Payer: Medicare Other

## 2021-01-21 ENCOUNTER — Other Ambulatory Visit (HOSPITAL_COMMUNITY): Payer: Medicare Other

## 2021-01-21 ENCOUNTER — Encounter (HOSPITAL_COMMUNITY): Payer: Medicare Other

## 2021-01-21 ENCOUNTER — Other Ambulatory Visit (HOSPITAL_COMMUNITY): Payer: Self-pay

## 2021-01-21 DIAGNOSIS — I4891 Unspecified atrial fibrillation: Secondary | ICD-10-CM | POA: Diagnosis not present

## 2021-01-21 DIAGNOSIS — R609 Edema, unspecified: Secondary | ICD-10-CM | POA: Diagnosis not present

## 2021-01-21 DIAGNOSIS — N171 Acute kidney failure with acute cortical necrosis: Secondary | ICD-10-CM

## 2021-01-21 DIAGNOSIS — J189 Pneumonia, unspecified organism: Secondary | ICD-10-CM

## 2021-01-21 DIAGNOSIS — Z96652 Presence of left artificial knee joint: Secondary | ICD-10-CM | POA: Diagnosis not present

## 2021-01-21 DIAGNOSIS — R579 Shock, unspecified: Secondary | ICD-10-CM | POA: Diagnosis not present

## 2021-01-21 LAB — BASIC METABOLIC PANEL
Anion gap: 14 (ref 5–15)
BUN: 94 mg/dL — ABNORMAL HIGH (ref 8–23)
CO2: 23 mmol/L (ref 22–32)
Calcium: 8.7 mg/dL — ABNORMAL LOW (ref 8.9–10.3)
Chloride: 94 mmol/L — ABNORMAL LOW (ref 98–111)
Creatinine, Ser: 3.51 mg/dL — ABNORMAL HIGH (ref 0.61–1.24)
GFR, Estimated: 17 mL/min — ABNORMAL LOW (ref >60)
Glucose, Bld: 221 mg/dL — ABNORMAL HIGH (ref 70–99)
Potassium: 3.8 mmol/L (ref 3.5–5.1)
Sodium: 131 mmol/L — ABNORMAL LOW (ref 135–145)

## 2021-01-21 LAB — CBC WITH DIFFERENTIAL/PLATELET
Abs Immature Granulocytes: 0.16 10*3/uL — ABNORMAL HIGH (ref 0.00–0.07)
Basophils Absolute: 0 10*3/uL (ref 0.0–0.1)
Basophils Relative: 1 %
Eosinophils Absolute: 0 10*3/uL (ref 0.0–0.5)
Eosinophils Relative: 0 %
HCT: 31.6 % — ABNORMAL LOW (ref 39.0–52.0)
Hemoglobin: 10.8 g/dL — ABNORMAL LOW (ref 13.0–17.0)
Immature Granulocytes: 3 %
Lymphocytes Relative: 10 %
Lymphs Abs: 0.6 10*3/uL — ABNORMAL LOW (ref 0.7–4.0)
MCH: 32.2 pg (ref 26.0–34.0)
MCHC: 34.2 g/dL (ref 30.0–36.0)
MCV: 94.3 fL (ref 80.0–100.0)
Monocytes Absolute: 0.6 10*3/uL (ref 0.1–1.0)
Monocytes Relative: 10 %
Neutro Abs: 4.6 10*3/uL (ref 1.7–7.7)
Neutrophils Relative %: 76 %
Platelets: 159 10*3/uL (ref 150–400)
RBC: 3.35 MIL/uL — ABNORMAL LOW (ref 4.22–5.81)
RDW: 14 % (ref 11.5–15.5)
WBC: 6.1 10*3/uL (ref 4.0–10.5)
nRBC: 0 % (ref 0.0–0.2)

## 2021-01-21 LAB — ECHOCARDIOGRAM COMPLETE
AR max vel: 1.39 cm2
AV Area VTI: 1.32 cm2
AV Area mean vel: 1.26 cm2
AV Mean grad: 7 mmHg
AV Peak grad: 12.3 mmHg
Ao pk vel: 1.75 m/s
Area-P 1/2: 4.06 cm2
Height: 68 in
S' Lateral: 2.7 cm
Weight: 3682.56 oz

## 2021-01-21 LAB — HEPARIN LEVEL (UNFRACTIONATED)
Heparin Unfractionated: 0.26 IU/mL — ABNORMAL LOW (ref 0.30–0.70)
Heparin Unfractionated: 0.23 IU/mL — ABNORMAL LOW (ref 0.30–0.70)
Heparin Unfractionated: 0.35 IU/mL (ref 0.30–0.70)

## 2021-01-21 LAB — GLUCOSE, CAPILLARY
Glucose-Capillary: 125 mg/dL — ABNORMAL HIGH (ref 70–99)
Glucose-Capillary: 155 mg/dL — ABNORMAL HIGH (ref 70–99)
Glucose-Capillary: 160 mg/dL — ABNORMAL HIGH (ref 70–99)
Glucose-Capillary: 165 mg/dL — ABNORMAL HIGH (ref 70–99)
Glucose-Capillary: 174 mg/dL — ABNORMAL HIGH (ref 70–99)
Glucose-Capillary: 221 mg/dL — ABNORMAL HIGH (ref 70–99)
Glucose-Capillary: 277 mg/dL — ABNORMAL HIGH (ref 70–99)

## 2021-01-21 LAB — PROTIME-INR
INR: 1.2 (ref 0.8–1.2)
Prothrombin Time: 15.5 seconds — ABNORMAL HIGH (ref 11.4–15.2)

## 2021-01-21 MED ORDER — TECHNETIUM TO 99M ALBUMIN AGGREGATED
4.1200 | Freq: Once | INTRAVENOUS | Status: AC | PRN
  Administered 2021-01-21: 4.12 via INTRAVENOUS

## 2021-01-21 MED ORDER — DIGOXIN 0.25 MG/ML IJ SOLN
0.2500 mg | Freq: Once | INTRAMUSCULAR | Status: AC
  Administered 2021-01-21: 0.25 mg via INTRAVENOUS
  Filled 2021-01-21: qty 2

## 2021-01-21 MED ORDER — HEPARIN (PORCINE) 25000 UT/250ML-% IV SOLN
1900.0000 [IU]/h | INTRAVENOUS | Status: DC
  Administered 2021-01-21 – 2021-01-22 (×3): 1700 [IU]/h via INTRAVENOUS
  Administered 2021-01-23 – 2021-01-24 (×2): 1900 [IU]/h via INTRAVENOUS
  Filled 2021-01-21 (×5): qty 250

## 2021-01-21 MED ORDER — PERFLUTREN LIPID MICROSPHERE
1.0000 mL | INTRAVENOUS | Status: AC | PRN
  Filled 2021-01-21: qty 10

## 2021-01-21 MED ORDER — AMIODARONE IV BOLUS ONLY 150 MG/100ML
150.0000 mg | Freq: Once | INTRAVENOUS | Status: AC
  Administered 2021-01-21: 150 mg via INTRAVENOUS

## 2021-01-21 MED ORDER — INSULIN GLARGINE 100 UNIT/ML ~~LOC~~ SOLN
8.0000 [IU] | Freq: Every day | SUBCUTANEOUS | Status: DC
  Administered 2021-01-21 – 2021-01-25 (×5): 8 [IU] via SUBCUTANEOUS
  Filled 2021-01-21 (×6): qty 0.08

## 2021-01-21 NOTE — Progress Notes (Signed)
Pt still unable to tolerate cpap due to presence of NG tube.

## 2021-01-21 NOTE — Progress Notes (Signed)
Patient admitted to orthopedic service for TKR with prior history of SBO, hypertension, diabetes and hospital service initially consulted for hypotension, acute kidney injury and constipation.  Hospitalization complicated by acute respiratory distress 01/20/2021 with associated hypotension, acute renal failure with creatinine going up as high as 4, abdominal distention with work-up consistent for ileus versus small bowel obstruction with multifocal pneumonia and persistent hypotension despite fluid resuscitation.  Patient currently requiring pressors, NG tube placement, work-up for PE/DVT underway.  Patient noted to be septic with associated hypovolemic shock.  PCCM consulted and managing and as such nothing further to add from our standpoint. Will sign off for now.  No charge.

## 2021-01-21 NOTE — Progress Notes (Signed)
Pharmacy Brief Note - Anticoagulation Evening Follow Up:  Patient on heparin infusion for atrial fibrillation and rule out VTE. For full history, see note by Leodis Sias, PharmD from earlier today.   Assessment:  HL obtained @ 1728 = 0.23 was subtherapeutic and decreased despite previously increasing rate of heparin infusion.   No reported interruptions with heparin infusion.  STAT repeat confirmatory HL ordered, resulted HL = 0.35 is therapeutic on heparin infusion of 1700 units/hr  Confirmed with RN that heparin infusing at correct rate. No signs of bleeding or issues with infusion.   Goal: HL 0.3 - 0.7  Plan:  Continue heparin infusion at current rate of 1700 units/hr  Check HL, CBC with AM labs tomorrow  Monitor for signs or symptoms of bleeding  Lenis Noon, PharmD 01/21/21 8:53 PM

## 2021-01-21 NOTE — Progress Notes (Addendum)
NAME:  [name], MRN:  [id number], DOB:  1946/03/14, LOS: 5 ADMISSION DATE:  01/16/2021, CONSULTATION DATE:  01/20/21 REFERRING MD:  Grandville Silos, CHIEF COMPLAINT:  SOB   History of Present Illness:  75 year old man with hx of SBO presenting for TKR complicated by ileus and now respiratory failure.  Per family (patient pretty out of it working to breathe), patient has been having slowly worsening respiratory status over last 24h.  Renal function also deteriorating.  As hypoxemia has worsened patient is being sent to unit with PCCM eval.  Pertinent  Medical History  OSA HTN HLD GERD DM  Significant Hospital Events: Including procedures, antibiotic start and stop dates in addition to other pertinent events   . 5/12 TKR . 5/15 deterioration, shock  Interim History / Subjective:  Afib/RVR overnight now converted on amio and heparin gtt.  Objective   Blood pressure (!) 120/51, pulse 84, temperature 97.8 F (36.6 C), temperature source Oral, resp. rate 17, height 5\' 8"  (1.727 m), weight 104.4 kg, SpO2 94 %. CVP:  [13 mmHg-20 mmHg] 14 mmHg  FiO2 (%):  [55 %] 55 %   Intake/Output Summary (Last 24 hours) at 01/21/2021 0707 Last data filed at 01/21/2021 0600 Gross per 24 hour  Intake 5472.68 ml  Output 3410 ml  Net 2062.68 ml   Filed Weights   01/16/21 0650 01/20/21 1300  Weight: 102.5 kg 104.4 kg    Examination: Constitutional: no acute distress resting in bed  Eyes: EOMI, pupils equal Ears, nose, mouth, and throat: MMM, trachea midline Cardiovascular: RRR, ext warm Respiratory: shallow inspiratory efforts, scattered rhonci, no accessory muscle use Gastrointestinal: protuberant , hypoactive BS Skin: No rashes, normal turgor, L knee TKA looks okay Neurologic: Globally weak Psychiatric: RASS 0    Labs/imaging that I havepersonally reviewed  (right click and "Reselect all SmartList Selections" daily)  Cr has hopefully plateau'd Lactate reassuring Making urine  Resolved  Hospital Problem list   n/a  Assessment & Plan:  Acute hypoxemic respiratory failure due to ileus with aspiration pneumonitis and atelectasis Septic and hypovolemic shock- improving OSA on CPAP S/p TKR- seems to be healing well Acute kidney injury related to poor PO, relative hypotension and losses with ileus; hopefully has stabilized Afib RVR- improved with amio  - LR: at 125cc/hr, avoid nephrotoxins - Amio and heparin gtt - Avoid CPAP or BIPAP; he will desat intermittently at night due to his OSA - NGT to LIS, continue reglan; relistor tomorrow - VQ scan ordered again by overnight team due to elevated D dimer - Zosyn x 5 days - Mobilize as able - Titrate pressors to MAP 65 - Will update family as they come in  Building surveyor (right click and "Reselect all SmartList Selections" daily)  Diet:  NPO Pain/Anxiety/Delirium protocol (if indicated): No VAP protocol (if indicated): Not indicated DVT prophylaxis: Subcutaneous Heparin and Systemic AC GI prophylaxis: N/A and PPI Glucose control:  SSI No Central venous access:  Yes, and it is still needed Arterial line:  N/A Foley:  Yes, and it is still needed Mobility:  bed rest  PT consulted: Yes Last date of multidisciplinary goals of care discussion [n/a] Code Status:  full code Disposition: ICU pending improvement in respiratory status   Patient critically ill due to shock Interventions to address this today pressor titration Risk of deterioration without these interventions is high  I personally spent 34 minutes providing critical care not including any separately billable procedures  Erskine Emery MD [facility] Pulmonary Critical Care  Prefer epic messenger for cross cover needs If after hours, please call E-link

## 2021-01-21 NOTE — Progress Notes (Addendum)
PT Cancellation Note  Patient Details Name: Latrell Potempa MRN: [id number] DOB: 23-Apr-1946   Cancelled Treatment:    Reason Eval/Treat Not Completed: Medical issues which prohibited therapy, noted incr. HR. On pressors,. Will follow up tomorrow.   Claretha Cooper 01/21/2021, 3:44 PM  [place] Pager [phone] Office [phone]

## 2021-01-21 NOTE — Progress Notes (Signed)
Subjective: 5 Days Post-Op Procedure(s) (LRB): TOTAL KNEE ARTHROPLASTY (Left) Patient reports pain as mild.    Objective: Vital signs in last 24 hours: Temp:  [97.5 F (36.4 C)-99 F (37.2 C)] 97.7 F (36.5 C) (05/16 1221) Pulse Rate:  [81-152] 128 (05/16 1215) Resp:  [17-40] 21 (05/16 1215) BP: (65-139)/(32-112) 93/60 (05/16 1215) SpO2:  [89 %-100 %] 94 % (05/16 1215) FiO2 (%):  [55 %] 55 % (05/15 2129) Weight:  [104.4 kg] 104.4 kg (05/15 1300)  Intake/Output from previous day: 05/15 0701 - 05/16 0700 In: 5472.7 [I.V.:3045; IV Piggyback:2427.7] Out: 3410 [Urine:1310; Emesis/NG output:2100] Intake/Output this shift: Total I/O In: 711.3 [I.V.:632.2; IV Piggyback:79] Out: 550 [Urine:450; Emesis/NG output:100]  Recent Labs    01/19/21 0329 01/20/21 0401 01/20/21 1220 01/20/21 1730 01/21/21 0330  HGB 13.8 14.4 13.4 12.0* 10.8*   Recent Labs    01/20/21 1730 01/21/21 0330  WBC 8.1 6.1  RBC 3.72* 3.35*  HCT 34.7* 31.6*  PLT 226 159   Recent Labs    01/20/21 2131 01/21/21 0330  NA 131* 131*  K 4.0 3.8  CL 97* 94*  CO2 21* 23  BUN 91* 94*  CREATININE 3.53* 3.51*  GLUCOSE 240* 221*  CALCIUM 8.5* 8.7*   Recent Labs    01/21/21 0330  INR 1.2    Neurologically intact Neurovascular intact Sensation intact distally Intact pulses distally Dorsiflexion/Plantar flexion intact Incision: dressing C/D/I and no drainage No cellulitis present Compartment soft no sign of DVT Abdominal distension  Assessment/Plan: 5 Days Post-Op Procedure(s) (LRB): TOTAL KNEE ARTHROPLASTY (Left) Up with therapy  For PT- bedside ROM L knee and out of bed when ok per primary service Post-op ileus per hospitalist  Cecilie Kicks 01/21/2021, 12:48 PM

## 2021-01-21 NOTE — Progress Notes (Signed)
[place] [facility] for Heparin Indication: suspected pulmonary embolism  Allergies  Allergen Reactions  . Penicillins Hives and Other (See Comments)    "cillins" Has patient had a PCN reaction causing immediate rash, facial/tongue/throat swelling, SOB or lightheadedness with hypotension: unknown Has patient had a PCN reaction causing severe rash involving mucus membranes or skin necrosis: unknown Has patient had a PCN reaction that required hospitalization no Has patient had a PCN reaction occurring within the last 10 years:no If all of the above answers are "NO", then may proceed with Cephalosporin use.     Patient Measurements: Height: 5\' 8"  (172.7 cm) Weight: 104.4 kg (230 lb 2.6 oz) IBW/kg (Calculated) : 68.4 Heparin Dosing Weight: 91 kg  Vital Signs: Temp: 97.9 F (36.6 C) (05/16 0843) Temp Source: Oral (05/16 0843) BP: 126/48 (05/16 0800) Pulse Rate: 84 (05/16 0800)  Labs: Recent Labs    01/20/21 1217 01/20/21 1220 01/20/21 1730 01/20/21 2131 01/21/21 0330 01/21/21 0811  HGB  --  13.4 12.0*  --  10.8*  --   HCT  --  39.1 34.7*  --  31.6*  --   PLT  --  223 226  --  159  --   LABPROT  --   --   --   --  15.5*  --   INR  --   --   --   --  1.2  --   HEPARINUNFRC  --   --   --   --   --  0.26*  CREATININE 4.01*  --   --  3.53* 3.51*  --   TROPONINIHS  --  21*  --  23*  --   --     Estimated Creatinine Clearance: 21.3 mL/min (A) (by C-G formula based on SCr of 3.51 mg/dL (H)).  Medications:   No oral anticoagulation PTA  Heparin 5000 units sq q8h for VTE prophylaxis started 01/20/2021 (last dose charted @ 23:00 on 5/15)  Assessment:  75 y.o. male known to pharmacy for Vancomycin dosing  This evening patient with new onset AFib with RVR.  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  First heparin level slightly subtherapeutic at 0.26 after heparin drip started at 1500 units/hr.  Hg 12> 10.8; PLT 226> 159; Scr 3.51.  No bleeding reported.   Goal of Therapy:  Heparin level 0.3-0.7 units/ml Monitor platelets by anticoagulation protocol: Yes   Plan:   Increase  IV heparin gtt to 1700 units/hr  Check heparin level 8 hrs after rate increase  Monitor for signs and symptoms of bleeding  Daily CBC and heparin level  F/u VQ & doppler results  Eudelia Bunch, PharmD 01/21/2021,9:09 AM

## 2021-01-21 NOTE — Progress Notes (Signed)
Bilateral lower extremity venous duplex has been completed. Preliminary results can be found in CV Proc through chart review.  Results were given to the patient's nurse, Megan.  01/21/21 11:27 AM [name] RVT

## 2021-01-21 NOTE — Progress Notes (Signed)
  Echocardiogram 2D Echocardiogram has been performed.  [name] 01/21/2021, 4:09 PM

## 2021-01-21 NOTE — TOC Progression Note (Signed)
Transition of Care [facility]) - Progression Note    Patient Details  Name: [name] MRN: [id number] Date of Birth: [birth year]  Transition of [facility]) CM/SW Contact  Leeroy Cha, RN Phone Number: 01/21/2021, 9:54 AM  Clinical Narrative:    Significant Hospital Events: Including procedures, antibiotic start and stop dates in addition to other pertinent events    5/12 TKR  5/15 deterioration, shock  Iv albumin, iv heparin, iv aminodarone, iv pitrussin. Following for progression and toc needs.  Pt not stable at this time.   Expected Discharge Plan: [place] Barriers to Discharge: Barriers Resolved  Expected Discharge Plan and Services Expected Discharge Plan: [place] In-house Referral: Clinical Social Work   Post Acute Care Choice: [place] Living arrangements for the past 2 months: [place] Expected Discharge Date: 01/20/21               DME Arranged: 3-N-1,Walker rolling DME Agency: [employer] Date DME Agency Contacted: 01/19/21 Time DME Agency Contacted: [phone] Representative spoke with at DME Agency: [place]: PT [place]: [facility] [place] office) Date [place]: 01/18/21 Time [place]: 1000 Representative spoke with at [place] (ph# [phone]; fax # [phone])   Social Determinants of Health ([place]) Interventions    Readmission Risk Interventions Readmission Risk Prevention Plan 01/17/2021  Transportation Screening Complete  PCP or Specialist Appt within 5-7 Days Complete  Home Care Screening Complete  Some recent data might be hidden

## 2021-01-22 DIAGNOSIS — Z96652 Presence of left artificial knee joint: Secondary | ICD-10-CM | POA: Diagnosis not present

## 2021-01-22 DIAGNOSIS — R579 Shock, unspecified: Secondary | ICD-10-CM | POA: Diagnosis not present

## 2021-01-22 DIAGNOSIS — N171 Acute kidney failure with acute cortical necrosis: Secondary | ICD-10-CM | POA: Diagnosis not present

## 2021-01-22 LAB — BASIC METABOLIC PANEL
Anion gap: 12 (ref 5–15)
BUN: 83 mg/dL — ABNORMAL HIGH (ref 8–23)
CO2: 25 mmol/L (ref 22–32)
Calcium: 8.6 mg/dL — ABNORMAL LOW (ref 8.9–10.3)
Chloride: 95 mmol/L — ABNORMAL LOW (ref 98–111)
Creatinine, Ser: 2.85 mg/dL — ABNORMAL HIGH (ref 0.61–1.24)
GFR, Estimated: 22 mL/min — ABNORMAL LOW (ref >60)
Glucose, Bld: 161 mg/dL — ABNORMAL HIGH (ref 70–99)
Potassium: 3.4 mmol/L — ABNORMAL LOW (ref 3.5–5.1)
Sodium: 132 mmol/L — ABNORMAL LOW (ref 135–145)

## 2021-01-22 LAB — GLUCOSE, CAPILLARY
Glucose-Capillary: 118 mg/dL — ABNORMAL HIGH (ref 70–99)
Glucose-Capillary: 128 mg/dL — ABNORMAL HIGH (ref 70–99)
Glucose-Capillary: 145 mg/dL — ABNORMAL HIGH (ref 70–99)
Glucose-Capillary: 150 mg/dL — ABNORMAL HIGH (ref 70–99)
Glucose-Capillary: 151 mg/dL — ABNORMAL HIGH (ref 70–99)
Glucose-Capillary: 180 mg/dL — ABNORMAL HIGH (ref 70–99)

## 2021-01-22 LAB — CBC
HCT: 28.5 % — ABNORMAL LOW (ref 39.0–52.0)
Hemoglobin: 9.7 g/dL — ABNORMAL LOW (ref 13.0–17.0)
MCH: 32.6 pg (ref 26.0–34.0)
MCHC: 34 g/dL (ref 30.0–36.0)
MCV: 95.6 fL (ref 80.0–100.0)
Platelets: 138 10*3/uL — ABNORMAL LOW (ref 150–400)
RBC: 2.98 MIL/uL — ABNORMAL LOW (ref 4.22–5.81)
RDW: 14.2 % (ref 11.5–15.5)
WBC: 6 10*3/uL (ref 4.0–10.5)
nRBC: 0 % (ref 0.0–0.2)

## 2021-01-22 LAB — HEPARIN LEVEL (UNFRACTIONATED): Heparin Unfractionated: 0.36 IU/mL (ref 0.30–0.70)

## 2021-01-22 LAB — MAGNESIUM: Magnesium: 4.3 mg/dL — ABNORMAL HIGH (ref 1.7–2.4)

## 2021-01-22 LAB — DIGOXIN LEVEL: Digoxin Level: 1.9 ng/mL (ref 0.8–2.0)

## 2021-01-22 MED ORDER — POTASSIUM CHLORIDE 10 MEQ/50ML IV SOLN
10.0000 meq | INTRAVENOUS | Status: AC
  Administered 2021-01-22 (×2): 10 meq via INTRAVENOUS
  Filled 2021-01-22 (×2): qty 50

## 2021-01-22 MED ORDER — DIGOXIN 0.25 MG/ML IJ SOLN
0.2500 mg | Freq: Once | INTRAMUSCULAR | Status: AC
  Administered 2021-01-22: 0.25 mg via INTRAVENOUS
  Filled 2021-01-22: qty 2

## 2021-01-22 MED ORDER — PHENOL 1.4 % MT LIQD
1.0000 | OROMUCOSAL | Status: DC | PRN
  Administered 2021-01-22 – 2021-01-24 (×3): 1 via OROMUCOSAL

## 2021-01-22 MED ORDER — METHYLNALTREXONE BROMIDE 12 MG/0.6ML ~~LOC~~ SOLN
12.0000 mg | Freq: Once | SUBCUTANEOUS | Status: AC
  Administered 2021-01-22: 12 mg via SUBCUTANEOUS
  Filled 2021-01-22: qty 0.6

## 2021-01-22 NOTE — Progress Notes (Signed)
Notified by lab in regards to questionable elevated digoxin level. Redrew digoxin level, being processed since 1817. Notified Merchandiser, retail covering patient over night shift that result should be resulting soon. Just called to verify with lab, digoxin level still processing.

## 2021-01-22 NOTE — Progress Notes (Signed)
Notified MD Tamala Julian in regards to patient converting back to Afib, rate 110s-130s. Patient still receiving Amio gtt. MD Tamala Julian ordered Digoxin 0.25mg  x1.

## 2021-01-22 NOTE — Progress Notes (Signed)
Pt still with NG tube in and unable to tolerate CPAP.  RT will continue to monitor for when Pt no longer has NG tube in.

## 2021-01-22 NOTE — Progress Notes (Signed)
[place] for Heparin Indication: AFib  Allergies  Allergen Reactions  . Penicillins Hives and Other (See Comments)    "cillins" Has patient had a PCN reaction causing immediate rash, facial/tongue/throat swelling, SOB or lightheadedness with hypotension: unknown Has patient had a PCN reaction causing severe rash involving mucus membranes or skin necrosis: unknown Has patient had a PCN reaction that required hospitalization no Has patient had a PCN reaction occurring within the last 10 years:no If all of the above answers are "NO", then may proceed with Cephalosporin use.     Patient Measurements: Height: 5\' 8"  (172.7 cm) Weight: 104.4 kg (230 lb 2.6 oz) IBW/kg (Calculated) : 68.4 Heparin Dosing Weight: 91 kg  Vital Signs: Temp: 97.6 F (36.4 C) (05/17 0400) Temp Source: Oral (05/17 0400) BP: 114/45 (05/17 0400) Pulse Rate: 62 (05/17 0400)  Labs: Recent Labs    01/20/21 1220 01/20/21 1730 01/20/21 2131 01/21/21 0330 01/21/21 0811 01/21/21 1728 01/21/21 1936 01/22/21 0330  HGB 13.4 12.0*  --  10.8*  --   --   --  9.7*  HCT 39.1 34.7*  --  31.6*  --   --   --  28.5*  PLT 223 226  --  159  --   --   --  138*  LABPROT  --   --   --  15.5*  --   --   --   --   INR  --   --   --  1.2  --   --   --   --   HEPARINUNFRC  --   --   --   --    < > 0.23* 0.35 0.36  CREATININE  --   --  3.53* 3.51*  --   --   --  2.85*  TROPONINIHS 21*  --  23*  --   --   --   --   --    < > = values in this interval not displayed.    Estimated Creatinine Clearance: 26.2 mL/min (A) (by C-G formula based on SCr of 2.85 mg/dL (H)).  Medications:   No oral anticoagulation PTA  Heparin 5000 units sq q8h for VTE prophylaxis started 01/20/2021 (last dose charted @ 23:00 on 5/15)  Assessment:  75 y.o. male known to pharmacy for Vancomycin dosing   patient with new onset AFib with RVR.  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

## 2021-01-22 NOTE — Progress Notes (Signed)
Physical Therapy Treatment Patient Details Name: [name] MRN: [id number] DOB: 22-Aug-1946 Today's Date: 01/22/2021    History of Present Illness Pt is a 75 year old male s/p left TKA on 01/16/21. Transferred to ICU on 5/15 due to respiratory failure, ileus, kidney failure    PT Comments    Patient able  To participate in TKA exercises on left while supine. . Continue mobility and exercises as medically able.   Follow Up Recommendations  Home health PT (may need SNF)     Equipment Recommendations  Rolling walker with 5" wheels    Recommendations for Other Services       Precautions / Restrictions Precautions Precautions: Fall;Knee Precaution Comments: NG tube, monitor HR/BP, on pressors Required Braces or Orthoses: Knee Immobilizer - Left Knee Immobilizer - Left: On when out of bed or walking Restrictions Weight Bearing Restrictions: No LLE Weight Bearing: Weight bearing as tolerated    Mobility   Ambulation/Gait                 Stairs             Wheelchair Mobility    Modified Rankin (Stroke Patients Only)       Balance Overall balance assessment: Needs assistance   Sitting balance-Leahy Scale: Poor Sitting balance - Comments: UE support   Standing balance support: Bilateral upper extremity supported Standing balance-Leahy Scale: Poor Standing balance comment: reliant on support                            Cognition Arousal/Alertness: Awake/alert Behavior During Therapy: WFL for tasks assessed/performed Overall Cognitive Status: Within Functional Limits for tasks assessed                                        Exercises Total Joint Exercises Ankle Circles/Pumps: AROM;10 reps;Both;Supine Quad Sets: AROM;Left;10 reps;Supine Short Arc Quad: AAROM;Left;10 reps;Supine Heel Slides: AAROM;Left;10 reps;Supine Hip ABduction/ADduction: AROM;Left;10 reps;Supine Straight Leg Raises: AAROM;Left;10 reps;Supine     General Comments        Pertinent Vitals/Pain Faces Pain Scale: Hurts a little bit Pain Location: left knee Pain Descriptors / Indicators: Aching;Sore Pain Intervention(s): Monitored during session;Premedicated before session;Ice applied    Home Living                      Prior Function            PT Goals (current goals can now be found in the care plan section) Acute Rehab PT Goals Patient Stated Goal: agreeable getting to chair PT Goal Formulation: With patient Time For Goal Achievement: 02/05/21 Potential to Achieve Goals: Good Progress towards PT goals: Progressing toward goals    Frequency    7X/week      PT Plan Current plan remains appropriate    Co-evaluation   Reason for Co-Treatment: Complexity of the patient's impairments (multi-system involvement);To address functional/ADL transfers PT goals addressed during session: Mobility/safety with mobility OT goals addressed during session: ADL's and self-care      AM-PAC PT "6 Clicks" Mobility   Outcome Measure  Help needed turning from your back to your side while in a flat bed without using bedrails?: A Lot Help needed moving from lying on your back to sitting on the side of a flat bed without using bedrails?: A Lot Help needed moving to and from  a bed to a chair (including a wheelchair)?: Total Help needed standing up from a chair using your arms (e.g., wheelchair or bedside chair)?: Total Help needed to walk in hospital room?: Total Help needed climbing 3-5 steps with a railing? : Total 6 Click Score: 8    End of Session Equipment Utilized During Treatment: Gait belt;Left knee immobilizer Activity Tolerance: Patient tolerated treatment well;Patient limited by fatigue Patient left: in bed;with call bell/phone within reach;with family/visitor present Nurse Communication: Mobility status PT Visit Diagnosis: Other abnormalities of gait and mobility (R26.89)     Time: 7517-0017 PT Time  Calculation (min) (ACUTE ONLY): 24 min  Charges:  2Ther exer  23-37 mins                    .  Shaniqua Guillot [place] Pager [phone] Office [phone]  01/22/2021, 4:37 PM

## 2021-01-22 NOTE — Progress Notes (Signed)
Physical Therapy Treatment Patient Details Name: [name] MRN: [id number] DOB: 1946-08-22 Today's Date: 01/22/2021    History of Present Illness Pt is a 75 year old male s/p left TKA on 01/16/21. Transferred to ICU on 5/15 due to respiratory failure, ileus, kidney failure    PT Comments    Patient stable today to mobilize from bed to recliner, RN in room to assist with safety and lines. Patient able to stand at  Rw and take small steps to turn to recliner, requiring 2 mod assist  For stability and 1 for lines. Continue PT for mobility and ROM post TKA.  BP post mobility 147/53 HR94. Patient on 4 l M[place] 95%, Removed briefly during transfer which dropped  To 85%.  Follow Up Recommendations  Home health PT     Equipment Recommendations  Rolling walker with 5" wheels;Wheelchair (measurements PT)    Recommendations for Other Services       Precautions / Restrictions Precautions Precautions: Fall;Knee Precaution Comments: NG tube Required Braces or Orthoses: Knee Immobilizer - Left Knee Immobilizer - Left: On when out of bed or walking Restrictions Weight Bearing Restrictions: No LLE Weight Bearing: Weight bearing as tolerated    Mobility  Bed Mobility Overal bed mobility: Needs Assistance Bed Mobility: Supine to Sit     Supine to sit: Mod assist;+2 for physical assistance;HOB elevated Sit to supine: Mod assist   General bed mobility comments: HOB elevated, needing assist to manage L LE to edge of bed as well as mod x2 for trunk support to sit upright    Transfers Overall transfer level: Needs assistance Equipment used: Rolling walker (2 wheeled) Transfers: Sit to/from [facility] Sit to Stand: Max assist;+2 physical assistance;+2 safety/equipment;From elevated surface Stand pivot transfers: Max assist;+2 physical assistance       General transfer comment: patient requiring cues for body mechanics to slide R LE under him to power up to stand. Heavy +2  to power up to standing with 3rd assist managing lines/chair. able to take 2-3 steps to pivot over to recliner  Ambulation/Gait                 Stairs             Wheelchair Mobility    Modified Rankin (Stroke Patients Only)       Balance Overall balance assessment: Needs assistance Sitting-balance support: Feet supported Sitting balance-Leahy Scale: Poor Sitting balance - Comments: UE support   Standing balance support: Bilateral upper extremity supported Standing balance-Leahy Scale: Poor Standing balance comment: reliant on support                            Cognition Arousal/Alertness: Awake/alert Behavior During Therapy: WFL for tasks assessed/performed Overall Cognitive Status: Within Functional Limits for tasks assessed                                        Exercises      General Comments General comments (skin integrity, edema, etc.): patient desat to 85% on room air, recovered once in chair on 4L to mid 90s. BP post transfer 147/53      Pertinent Vitals/Pain Pain Assessment: Faces Faces Pain Scale: Hurts little more Pain Location: left knee with mobility Pain Descriptors / Indicators: Aching;Sore Pain Intervention(s): Limited activity within patient's tolerance;Monitored during session;Premedicated before session  Home Living Family/patient expects to be discharged to:: Skilled nursing facility Living Arrangements: Spouse/significant other;Children   Type of Home: House Home Access: Level entry   Home Layout: Laundry or work area in basement;Able to live on main level with bedroom/bathroom Home Equipment: Kasandra Knudsen - single point;Walker - 2 wheels      Prior Function Level of Independence: Independent with assistive device(s)      Comments: using SPC prior to surgery   PT Goals (current goals can now be found in the care plan section) Acute Rehab PT Goals Patient Stated Goal: agreeable getting to chair PT  Goal Formulation: With patient Time For Goal Achievement: 02/05/21 Potential to Achieve Goals: Good Progress towards PT goals: Progressing toward goals    Frequency    7X/week      PT Plan Current plan remains appropriate    Co-evaluation   Reason for Co-Treatment: Complexity of the patient's impairments (multi-system involvement);To address functional/ADL transfers PT goals addressed during session: Mobility/safety with mobility OT goals addressed during session: ADL's and self-care      AM-PAC PT "6 Clicks" Mobility   Outcome Measure  Help needed turning from your back to your side while in a flat bed without using bedrails?: A Lot Help needed moving from lying on your back to sitting on the side of a flat bed without using bedrails?: A Lot Help needed moving to and from a bed to a chair (including a wheelchair)?: Total Help needed standing up from a chair using your arms (e.g., wheelchair or bedside chair)?: Total Help needed to walk in hospital room?: Total Help needed climbing 3-5 steps with a railing? : Total 6 Click Score: 8    End of Session Equipment Utilized During Treatment: Gait belt;Left knee immobilizer Activity Tolerance: Patient tolerated treatment well;Patient limited by fatigue Patient left: in chair;with call bell/phone within reach;with family/visitor present;with chair alarm set Nurse Communication: Mobility status PT Visit Diagnosis: Other abnormalities of gait and mobility (R26.89)     Time: 0086-7619 PT Time Calculation (min) (ACUTE ONLY): 26 min  Charges:  $Therapeutic Activity: 8-22 mins                     [place] Pager [phone] Office [phone]    Claretha Cooper 01/22/2021, 1:25 PM

## 2021-01-22 NOTE — Progress Notes (Signed)
[facility] Progress Note Patient Name: [name] DOB: Jun 21, 1946 MRN: [id number]   Date of Service  01/22/2021  HPI/Events of Note  K+ 3.4, GFR 22. Patient is NPO.  eICU Interventions  KCL 20 meq over 2 hours via central line ordered.        Kerry Kass [place] 01/22/2021, 5:17 AM

## 2021-01-22 NOTE — Progress Notes (Addendum)
Subjective: 6 Days Post-Op Procedure(s) (LRB): TOTAL KNEE ARTHROPLASTY (Left) Patient reports pain as 1 on 0-10 scale.   Denies CP or SOB.  Foley. Possible flatus Objective: Vital signs in last 24 hours: Temp:  [97.3 F (36.3 C)-99 F (37.2 C)] 97.9 F (36.6 C) (05/17 1215) Pulse Rate:  [62-131] 131 (05/17 1445) Resp:  [13-23] 17 (05/17 1445) BP: (87-147)/(33-75) 107/52 (05/17 1445) SpO2:  [90 %-98 %] 94 % (05/17 1445)  Intake/Output from previous day: 05/16 0701 - 05/17 0700 In: 4075 [P.O.:320; I.V.:3483.4; IV Piggyback:241.6] Out: 2425 [Urine:1825; Emesis/NG output:600] Intake/Output this shift: Total I/O In: 1322.9 [I.V.:1272.9; IV Piggyback:50] Out: 590 [Urine:590]  Recent Labs    01/20/21 0401 01/20/21 1220 01/20/21 1730 01/21/21 0330 01/22/21 0330  HGB 14.4 13.4 12.0* 10.8* 9.7*   Recent Labs    01/21/21 0330 01/22/21 0330  WBC 6.1 6.0  RBC 3.35* 2.98*  HCT 31.6* 28.5*  PLT 159 138*   Recent Labs    01/21/21 0330 01/22/21 0330  NA 131* 132*  K 3.8 3.4*  CL 94* 95*  CO2 23 25  BUN 94* 83*  CREATININE 3.51* 2.85*  GLUCOSE 221* 161*  CALCIUM 8.7* 8.6*   Recent Labs    01/21/21 0330  INR 1.2    Neurologically intact Intact pulses distally Incision: scant drainage  Knee immobilizer compressing medial knee With impression. No DVT No significant swelling. Peripheral edema  Assessment/Plan:  6 Days Post-Op Procedure(s) (LRB): TOTAL KNEE ARTHROPLASTY (Left) Up with therapy  Loosen knee immobilizer. Discussed with nursing Decreased Hgb possibly dilutional. No evidence of extremity bleed. UO improved. Cr decreased. Appreciate ICU team    Active Problems:   S/P TKR (total knee replacement) using cement   Acute respiratory distress   AKI (acute kidney injury) ([place])   Acute renal failure (HCC)   Constipation   Benign prostatic hyperplasia   OSA (obstructive sleep apnea)   Hypotension   Type 2 diabetes mellitus without complication  ([place])      Johnn Hai 01/22/2021, @NOW 

## 2021-01-22 NOTE — Progress Notes (Addendum)
NAME:  [name], MRN:  [id number], DOB:  May 09, 1946, LOS: 6 ADMISSION DATE:  01/16/2021, CONSULTATION DATE:  01/20/21 REFERRING MD:  Grandville Silos, CHIEF COMPLAINT:  SOB   History of Present Illness:  75 year old man with hx of SBO presenting for TKR complicated by ileus and now respiratory failure.  Per family (patient pretty out of it working to breathe), patient has been having slowly worsening respiratory status over last 24h.  Renal function also deteriorating.  As hypoxemia has worsened patient is being sent to unit with PCCM eval.  Pertinent  Medical History  OSA HTN HLD GERD DM  Significant Hospital Events: Including procedures, antibiotic start and stop dates in addition to other pertinent events   . 5/12 TKR . 5/15 deterioration, shock . 5/16 improving pressors . 5/17 stable pressors  Interim History / Subjective:  No events. Intermittent afib  Objective   Blood pressure (!) 117/44, pulse 66, temperature 97.6 F (36.4 C), temperature source Oral, resp. rate 15, height 5\' 8"  (1.727 m), weight 104.4 kg, SpO2 95 %. CVP:  [10 mmHg-17 mmHg] 15 mmHg      Intake/Output Summary (Last 24 hours) at 01/22/2021 8850 Last data filed at 01/22/2021 0700 Gross per 24 hour  Intake 3881.07 ml  Output 2425 ml  Net 1456.07 ml   Filed Weights   01/16/21 0650 01/20/21 1300  Weight: 102.5 kg 104.4 kg    Examination: Constitutional: elderly man in NAD  Eyes: EOMI, pupils equal Ears, nose, mouth, and throat: NGT in place with bilious output Cardiovascular: RRR, ext warm Respiratory: Diminished at bases, no accessory muscle use, less tachypneic than yesterday Gastrointestinal: protuberant, hypoactive bowel sounds, mildly TTP Skin: No rashes, normal turgor Neurologic: moves all 4 ext to command, weak MSK: L knee TKR site looks CDI Psychiatric: RASS 0  Labs/imaging that I havepersonally reviewed  (right click and "Reselect all SmartList Selections" daily)  CBC looks good, some  dilutional changes Cr improved VQ and duplex neg  Resolved Hospital Problem list   n/a  Assessment & Plan:  Acute hypoxemic respiratory failure due to ileus with aspiration pneumonitis and atelectasis Septic and hypovolemic shock- stable, unclear why has ongoing pressor requirement OSA on CPAP PTA- holding this in setting of ileus and aspiration S/p TKR- seems to be healing well Acute kidney injury related to poor PO, relative hypotension and losses with ileus; hopefully has stabilized Afib RVR- improved with amio and intermittent digoxin  - Continue LR at 125cc/hr, avoid nephrotoxins - Amio and heparin gtt - Avoid CPAP or BIPAP - NGT on LIS, continue reglan; redose relistor; may need to consider TPN if return of bowel function continues to be delayed; if still having ileus tomorrow will get a CT of abdomen/pelvis to see if decompressive colonoscopy is option - Ceftriaxone x 5 days - ROM exercises, mobility if we can - Titrate pressors to MAP 65 - Will update family as they come in  Best practice (right click and "Reselect all SmartList Selections" daily)  Diet:  NPO Pain/Anxiety/Delirium protocol (if indicated): No VAP protocol (if indicated): Not indicated DVT prophylaxis: Systemic AC GI prophylaxis: PPI Glucose control:  SSI Yes Central venous access:  Yes, and it is still needed Arterial line:  N/A Foley:  Yes, and it is still needed Mobility:  bed rest  PT consulted: Yes Last date of multidisciplinary goals of care discussion [n/a] Code Status:  full code Disposition: ICU pending shock resolution   Patient critically ill due to shock Interventions to  address this today pressor titration Risk of deterioration without these interventions is high  I personally spent 38 minutes providing critical care not including any separately billable procedures  Erskine Emery MD [facility]  Prefer epic messenger for cross cover needs If after hours, please  call E-link

## 2021-01-22 NOTE — Evaluation (Signed)
Occupational Therapy Evaluation Patient Details Name: [name] MRN: [id number] DOB: 03-25-1946 Today's Date: 01/22/2021    History of Present Illness Pt is a 75 year old male s/p left TKA on 01/16/21. Transferred to ICU on 5/15 due to respiratory failure, ileus, kidney failure   Clinical Impression   Patient lives at home with spouse, able to live on main level and is typically independent with self care at baseline. Patient was doing well with PT post op L TKA unfortunately developed post op complications including respiratory failure, ileus and kidney failure. Currently patient with increased weakness/deconditioning, stiff/painful L knee and overall decline in activity tolerance + balance. Patient seen in conjunction with PT and nursing for physical assist as well as line management/safety. Patient needing mod x2 to transfer to edge of bed. Verbal cues for body mechanics and heavy max x2 to power up to standing with 3rd assist managing lines and chair. Patient able to support his weight once in standing and mod x1-2 to take 2 to 3 steps to recliner. Recommend continued acute OT services to maximize patient strength, endurance, balance in order to facilitate D/C to venue listed below.    Follow Up Recommendations  SNF    Equipment Recommendations  3 in 1 bedside commode       Precautions / Restrictions Precautions Precautions: Fall;Knee Precaution Comments: NG tube Required Braces or Orthoses: Knee Immobilizer - Left Restrictions Weight Bearing Restrictions: Yes LLE Weight Bearing: Weight bearing as tolerated      Mobility Bed Mobility Overal bed mobility: Needs Assistance Bed Mobility: Supine to Sit     Supine to sit: Mod assist;+2 for physical assistance;HOB elevated     General bed mobility comments: HOB elevated, needing assist to manage L LE to edge of bed as well as mod x2 for trunk support to sit upright    Transfers Overall transfer level: Needs  assistance Equipment used: Rolling walker (2 wheeled) Transfers: Sit to/from [facility] Sit to Stand: Max assist (+3, heavy 2 to stand 3rd for line management/chair) Stand pivot transfers: Max assist;+2 physical assistance (3rd for chair management)       General transfer comment: patient requiring cues for body mechanics to slide R LE under him to power up to stand. heavy +2 to power up to standing with 3rd assist managing lines/chair. able to take 2-3 steps to pivot over to recliner    Balance Overall balance assessment: Needs assistance Sitting-balance support: Feet supported Sitting balance-Leahy Scale: Poor Sitting balance - Comments: UE support   Standing balance support: Bilateral upper extremity supported Standing balance-Leahy Scale: Zero Standing balance comment: max A                           ADL either performed or assessed with clinical judgement   ADL Overall ADL's : Needs assistance/impaired     Grooming: Minimal assistance;Sitting   Upper Body Bathing: Minimal assistance;Sitting   Lower Body Bathing: Total assistance;Sitting/lateral leans;Bed level   Upper Body Dressing : Minimal assistance;Sitting   Lower Body Dressing: Total assistance;Sitting/lateral leans;Bed level Lower Body Dressing Details (indicate cue type and reason): to don socks Toilet Transfer: Stand-pivot;Cueing for safety;Cueing for sequencing;RW (+3 two for physical assistance and 1 more for line management/chair placement) Toilet Transfer Details (indicate cue type and reason): +3; two for physical assistance and 1 more for line management/chair placement. needing significant assist to power up to standing once up able to support weight through LEs  and walker. increased time and assist to take few steps to pivot to chair Toileting- Clothing Manipulation and Hygiene: Total assistance       Functional mobility during ADLs: Maximal assistance;Rolling walker;Cueing  for safety;Cueing for sequencing (+3) General ADL Comments: patient requiring significant assistance with self care compared to baseline due to medical decline post op, decreased activity tolerance, balance, strength,                  Pertinent Vitals/Pain Pain Assessment: Faces Faces Pain Scale: Hurts little more Pain Location: left knee with mobility Pain Descriptors / Indicators: Aching;Sore Pain Intervention(s): Monitored during session     Hand Dominance  (did not specify)   Extremity/Trunk Assessment Upper Extremity Assessment Upper Extremity Assessment: Generalized weakness   Lower Extremity Assessment Lower Extremity Assessment: Defer to PT evaluation       Communication Communication Communication: HOH (hearing aides)   Cognition Arousal/Alertness: Awake/alert Behavior During Therapy: WFL for tasks assessed/performed Overall Cognitive Status: Within Functional Limits for tasks assessed                                     General Comments  patient desat to 85% on room air, recovered once in chair on 4L to mid 90s. BP post transfer 147/53            Home Living Family/patient expects to be discharged to:: Skilled nursing facility Living Arrangements: Spouse/significant other;Children   Type of Home: House Home Access: Level entry     Home Layout: Laundry or work area in basement;Able to live on main level with bedroom/bathroom               Home Equipment: Kasandra Knudsen - single point;Walker - 2 wheels          Prior Functioning/Environment Level of Independence: Independent with assistive device(s)        Comments: using SPC prior to surgery        OT Problem List: Decreased strength;Decreased activity tolerance;Impaired balance (sitting and/or standing);Decreased safety awareness;Decreased knowledge of use of DME or AE;Decreased knowledge of precautions;Obesity;Pain      OT Treatment/Interventions: Self-care/ADL training;DME  and/or AE instruction;Therapeutic activities;Patient/family education;Balance training    OT Goals(Current goals can be found in the care plan section) Acute Rehab OT Goals Patient Stated Goal: agreeable getting to chair OT Goal Formulation: With patient Time For Goal Achievement: 02/05/21 Potential to Achieve Goals: Good  OT Frequency: Min 2X/week           Co-evaluation PT/OT/SLP Co-Evaluation/Treatment: Yes Reason for Co-Treatment: To address functional/ADL transfers;For patient/therapist safety;Complexity of the patient's impairments (multi-system involvement) PT goals addressed during session: Mobility/safety with mobility OT goals addressed during session: ADL's and self-care      AM-PAC OT "6 Clicks" Daily Activity     Outcome Measure Help from another person eating meals?: Total (NPO) Help from another person taking care of personal grooming?: A Little Help from another person toileting, which includes using toliet, bedpan, or urinal?: Total Help from another person bathing (including washing, rinsing, drying)?: A Lot Help from another person to put on and taking off regular upper body clothing?: A Little Help from another person to put on and taking off regular lower body clothing?: Total 6 Click Score: 11   End of Session Equipment Utilized During Treatment: Gait belt;Rolling walker;Left knee immobilizer;Oxygen Nurse Communication: Mobility status;Other (comment) (RN present for treatment)  Activity Tolerance:  Patient tolerated treatment well Patient left: in chair;with call bell/phone within reach;with chair alarm set;with nursing/sitter in room;with family/visitor present  OT Visit Diagnosis: Unsteadiness on feet (R26.81);Other abnormalities of gait and mobility (R26.89);Muscle weakness (generalized) (M62.81);Pain Pain - Right/Left: Left Pain - part of body: Knee                Time: 1856-3149 OT Time Calculation (min): 21 min Charges:  OT General Charges $OT  Visit: 1 Visit OT Evaluation $OT Eval Moderate Complexity: [place] OT OT pager: [place] 01/22/2021, 11:16 AM

## 2021-01-23 ENCOUNTER — Inpatient Hospital Stay (HOSPITAL_COMMUNITY): Payer: Medicare Other

## 2021-01-23 ENCOUNTER — Inpatient Hospital Stay: Payer: Self-pay

## 2021-01-23 DIAGNOSIS — D62 Acute posthemorrhagic anemia: Secondary | ICD-10-CM | POA: Diagnosis not present

## 2021-01-23 DIAGNOSIS — K567 Ileus, unspecified: Secondary | ICD-10-CM | POA: Diagnosis not present

## 2021-01-23 DIAGNOSIS — R0602 Shortness of breath: Secondary | ICD-10-CM | POA: Diagnosis not present

## 2021-01-23 DIAGNOSIS — I480 Paroxysmal atrial fibrillation: Secondary | ICD-10-CM | POA: Diagnosis not present

## 2021-01-23 DIAGNOSIS — R601 Generalized edema: Secondary | ICD-10-CM | POA: Diagnosis not present

## 2021-01-23 LAB — CBC
HCT: 29.9 % — ABNORMAL LOW (ref 39.0–52.0)
Hemoglobin: 10 g/dL — ABNORMAL LOW (ref 13.0–17.0)
MCH: 31.3 pg (ref 26.0–34.0)
MCHC: 33.4 g/dL (ref 30.0–36.0)
MCV: 93.7 fL (ref 80.0–100.0)
Platelets: 156 10*3/uL (ref 150–400)
RBC: 3.19 MIL/uL — ABNORMAL LOW (ref 4.22–5.81)
RDW: 14.3 % (ref 11.5–15.5)
WBC: 8.5 10*3/uL (ref 4.0–10.5)
nRBC: 0 % (ref 0.0–0.2)

## 2021-01-23 LAB — BASIC METABOLIC PANEL
Anion gap: 12 (ref 5–15)
Anion gap: 14 (ref 5–15)
BUN: 73 mg/dL — ABNORMAL HIGH (ref 8–23)
BUN: 66 mg/dL — ABNORMAL HIGH (ref 8–23)
CO2: 27 mmol/L (ref 22–32)
CO2: 26 mmol/L (ref 22–32)
Calcium: 8.7 mg/dL — ABNORMAL LOW (ref 8.9–10.3)
Calcium: 8.9 mg/dL (ref 8.9–10.3)
Chloride: 98 mmol/L (ref 98–111)
Chloride: 99 mmol/L (ref 98–111)
Creatinine, Ser: 1.95 mg/dL — ABNORMAL HIGH (ref 0.61–1.24)
Creatinine, Ser: 1.86 mg/dL — ABNORMAL HIGH (ref 0.61–1.24)
GFR, Estimated: 35 mL/min — ABNORMAL LOW (ref >60)
GFR, Estimated: 37 mL/min — ABNORMAL LOW (ref >60)
Glucose, Bld: 121 mg/dL — ABNORMAL HIGH (ref 70–99)
Glucose, Bld: 125 mg/dL — ABNORMAL HIGH (ref 70–99)
Potassium: 3.1 mmol/L — ABNORMAL LOW (ref 3.5–5.1)
Potassium: 3.5 mmol/L (ref 3.5–5.1)
Sodium: 137 mmol/L (ref 135–145)
Sodium: 139 mmol/L (ref 135–145)

## 2021-01-23 LAB — HEPARIN LEVEL (UNFRACTIONATED)
Heparin Unfractionated: 0.25 IU/mL — ABNORMAL LOW (ref 0.30–0.70)
Heparin Unfractionated: 0.32 IU/mL (ref 0.30–0.70)

## 2021-01-23 LAB — PHOSPHORUS: Phosphorus: 3 mg/dL (ref 2.5–4.6)

## 2021-01-23 LAB — GLUCOSE, CAPILLARY
Glucose-Capillary: 104 mg/dL — ABNORMAL HIGH (ref 70–99)
Glucose-Capillary: 119 mg/dL — ABNORMAL HIGH (ref 70–99)
Glucose-Capillary: 127 mg/dL — ABNORMAL HIGH (ref 70–99)
Glucose-Capillary: 111 mg/dL — ABNORMAL HIGH (ref 70–99)
Glucose-Capillary: 147 mg/dL — ABNORMAL HIGH (ref 70–99)
Glucose-Capillary: 171 mg/dL — ABNORMAL HIGH (ref 70–99)

## 2021-01-23 LAB — MAGNESIUM: Magnesium: 3.9 mg/dL — ABNORMAL HIGH (ref 1.7–2.4)

## 2021-01-23 MED ORDER — METOPROLOL TARTRATE 5 MG/5ML IV SOLN
2.5000 mg | INTRAVENOUS | Status: DC | PRN
  Administered 2021-01-25: 5 mg via INTRAVENOUS
  Filled 2021-01-23: qty 5

## 2021-01-23 MED ORDER — POTASSIUM CHLORIDE 10 MEQ/50ML IV SOLN
10.0000 meq | INTRAVENOUS | Status: AC
Start: 2021-01-23 — End: 2021-01-23
  Administered 2021-01-23 (×3): 10 meq via INTRAVENOUS
  Filled 2021-01-23 (×3): qty 50

## 2021-01-23 MED ORDER — TRAVASOL 10 % IV SOLN
INTRAVENOUS | Status: AC
  Filled 2021-01-23: qty 528

## 2021-01-23 MED ORDER — POLYETHYLENE GLYCOL 3350 17 G PO PACK
17.0000 g | PACK | Freq: Two times a day (BID) | ORAL | Status: DC
  Administered 2021-01-23 – 2021-01-26 (×7): 17 g
  Filled 2021-01-23 (×7): qty 1

## 2021-01-23 MED ORDER — LACTATED RINGERS IV SOLN: INTRAVENOUS | Status: AC

## 2021-01-23 NOTE — Progress Notes (Signed)
Subjective: 7 Days Post-Op Procedure(s) (LRB): TOTAL KNEE ARTHROPLASTY (Left) Patient reports pain as mild.  Reports his abdomen still feels full and uncomfortable, not passing gas  Objective: Vital signs in last 24 hours: Temp:  [97.7 F (36.5 C)-98.5 F (36.9 C)] 97.9 F (36.6 C) (05/18 0300) Pulse Rate:  [72-196] 88 (05/18 0600) Resp:  [13-20] 17 (05/18 0600) BP: (98-147)/(38-75) 136/51 (05/18 0600) SpO2:  [92 %-99 %] 97 % (05/18 0600)  Intake/Output from previous day: 05/17 0701 - 05/18 0700 In: 3297.9 [I.V.:2845.1; IV Piggyback:452.9] Out: 2915 [DEYCX:4481; Emesis/NG output:1250] Intake/Output this shift: Total I/O In: 1220.7 [I.V.:1220.7] Out: 700 [Urine:700]  Recent Labs    01/20/21 1220 01/20/21 1730 01/21/21 0330 01/22/21 0330 01/23/21 0454  HGB 13.4 12.0* 10.8* 9.7* 10.0*   Recent Labs    01/22/21 0330 01/23/21 0454  WBC 6.0 8.5  RBC 2.98* 3.19*  HCT 28.5* 29.9*  PLT 138* 156   Recent Labs    01/22/21 0330 01/23/21 0730  NA 132* 137  K 3.4* 3.1*  CL 95* 98  CO2 25 27  BUN 83* 73*  CREATININE 2.85* 1.95*  GLUCOSE 161* 121*  CALCIUM 8.6* 8.7*   Recent Labs    01/21/21 0330  INR 1.2    Neurologically intact ABD soft Neurovascular intact Sensation intact distally Intact pulses distally Dorsiflexion/Plantar flexion intact Incision: no drainage No cellulitis present Compartment soft No sign of DVT  Assessment/Plan: 7 Days Post-Op Procedure(s) (LRB): TOTAL KNEE ARTHROPLASTY (Left) Management of ileus per hospitalists/critical care, appreciate assistance Discussed with Dr. Valentino Saxon Deatra Robinson 01/23/2021, 8:58 AM

## 2021-01-23 NOTE — Progress Notes (Signed)
PT Cancellation Note  Patient Details Name: Radwan Cowley MRN: [id number] DOB: 1946-04-24   Cancelled Treatment:    Reason Eval/Treat Not Completed: Patient at procedure or test/unavailable (pt is off the floor at CT. Will follow.)   Philomena Doheny PT 01/23/2021  Acute Rehabilitation Services Pager [phone] Office [phone]

## 2021-01-23 NOTE — Consult Note (Addendum)
Consultation  Referring Provider: CCM/Olalere Adewale  Primary Care Physician:  Bing Neighbors, NP Primary Gastroenterologist:  Dr.Jacobs  Reason for Consultation:  Ileus vs SBO  HPI: [name] is a 75 y.o. male, known to Dr. Ardis Hughs with prior evaluation in 2019 endoscopically.  He was last seen in June 2021 with complaints of loose stools.  This was felt possibly medication related due to metformin, versus IBS. He underwent CT imaging 03/05/2020 with IV contrast that did not show any acute intra-abdominal or pelvic findings, noted left-sided diverticulosis.  Fecal elastase negative at that time. Patient was admitted currently on 01/16/2021 and underwent left knee replacement on 01/17/2021. Course has been complicated by hypotension and respiratory distress and then development of abdominal distention.  He required critical care support on 01/19/2021 and briefly required pressors which have now been stopped.  NG was placed on 01/20/2021.  He has been covered with Zosyn and heparin and there was concern for possible aspiration pneumonitis.  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 He relates that he had "an obstruction" a couple of years ago at which time he was hospitalized in [place].  He did not require surgery and according to his wife did not require an NG tube, symptoms resolved with supportive management. He has not had any prior abdominal surgery. Patient is giving a history of having intermittent diarrhea over the past couple of years with episodes of diarrhea lasting for a few days "running off" followed by normal bowel movements and occasional episodes of constipation.  He relates that he had lost quite a bit of weight over the past couple of years up to about 40 pounds but also had COVID in December 2020 which required  multiple months of recovery.  CT of the abdomen pelvis was done today-NG tube is in the distal stomach, stomach is nondistended there dilated small bowel loops in the pelvis concerning for ileus or distal small bowel obstruction no colonic dilation noted  KUB 01/21/2021 had shown gaseous prominence of the large bowel and small bowel dilated up to 4.8 cm consistent with nonspecific ileus.   Patient denies any significant abdominal pain, just feels distended and uncomfortable.  NG was reconnected to intermittent suction again this morning, he had about 300 to 500 cc out earlier.  He says that he feels like he needs to have a bowel movement but has not had a bowel movement over the past 3 to 4 days.  Apparently he did have some laxative about 4 days ago with some results.  TPN is to be started today He has not been taking any pain medication.  Labs today WBC 8.5/hemoglobin 10/hematocrit 29.9 Potassium 3.1/creatinine 1.9 improved/magnesium 1.39 elderly white male Past Medical History:  Diagnosis Date  . Cancer ([place]) 2011   lymphoma L leg  . Diabetes mellitus without complication ([place])   . GERD (gastroesophageal reflux disease)   . Hx of lymphoma   . Hyperlipidemia   . Hypertension   . Hypotestosteronemia   . Sleep apnea 2013   wears CPAP    Past Surgical History:  Procedure Laterality Date  . CARDIAC CATHETERIZATION     Dr. Sabra Heck  . RIGHT/LEFT HEART CATH AND CORONARY ANGIOGRAPHY N/A 01/02/2017   Procedure: Right/Left Heart Cath and Coronary Angiography;  Surgeon: Jolaine Artist, MD;  Location: [facility];  Service: Cardiovascular;  Laterality:  N/A;  . TOTAL KNEE ARTHROPLASTY Left 01/16/2021   Procedure: TOTAL KNEE ARTHROPLASTY;  Surgeon: Susa Day, MD;  Location: [facility];  Service: Orthopedics;  Laterality: Left;  2.5 hrs General vs Spinal    Prior to Admission medications   Medication Sig Start Date End Date Taking? Authorizing Provider  allopurinol (ZYLOPRIM) 100  MG tablet Take 100 mg by mouth at bedtime. 07/24/20  Yes [provider]  Ascorbic Acid (VITAMIN C) 1000 MG tablet Take 1,000 mg by mouth in the morning.   Yes [provider]  atorvastatin (LIPITOR) 20 MG tablet Take 20 mg by mouth every evening. 08/31/20  Yes [provider]  B Complex-Biotin-FA (VITAMIN B50 COMPLEX PO) Take 1 capsule by mouth in the morning.   Yes [provider]  Calcium Carb-Cholecalciferol (CALCIUM 600+D) 600-800 MG-UNIT TABS Take 1 tablet by mouth in the morning.   Yes [provider]  Cholecalciferol (VITAMIN D3) 10 MCG (400 UNIT) tablet Take 400 Units by mouth in the morning.   Yes [provider]  diclofenac Sodium (VOLTAREN) 1 % GEL Apply 1 application topically 4 (four) times daily as needed (knee pain).   Yes [provider]  doxazosin (CARDURA) 8 MG tablet Take 1 tablet (8 mg total) by mouth daily. Patient taking differently: Take 8 mg by mouth in the morning. 10/15/20  Yes Minna Merritts, MD  furosemide (LASIX) 20 MG tablet Take 1 tablet (20 mg total) by mouth daily as needed. Patient taking differently: Take 20 mg by mouth daily as needed (fluid retention/swelling). 08/14/20  Yes Gollan, Kathlene November, MD  hydrochlorothiazide (HYDRODIURIL) 25 MG tablet Take 1 tablet (25 mg total) by mouth daily. Patient taking differently: Take 25 mg by mouth in the morning. 08/14/20  Yes Gollan, Kathlene November, MD  ipratropium-albuterol (DUONEB) 0.5-2.5 (3) MG/3ML SOLN Inhale 3 mLs into the lungs every 6 (six) hours as needed (illness (colds)/respiratory).   Yes [provider]  lisinopril (ZESTRIL) 40 MG tablet Take 1 tablet (40 mg total) by mouth at bedtime. 08/14/20  Yes Gollan, Kathlene November, MD  Menthol, Topical Analgesic, (BIOFREEZE EX) Apply 1 application topically 4 (four) times daily as needed (knee pain.).   Yes [provider]  metFORMIN (GLUCOPHAGE-XR) 500 MG 24 hr tablet Take 500 mg by mouth at bedtime.  02/20/20  Yes [provider]  metoprolol succinate (TOPROL-XL) 50 MG 24 hr tablet Take 1 tablet (50 mg total) by mouth daily. Take 1 tablet (50 mg) by mouth once daily in the morning. Take with or immediately following a meal. Patient taking differently: Take 50 mg by mouth in the morning. Take 1 tablet (50 mg) by mouth once daily in the morning. Take with or immediately following a meal. 08/14/20  Yes Gollan, Kathlene November, MD  Multiple Vitamin (MULTIVITAMIN WITH MINERALS) TABS tablet Take 1 tablet by mouth in the morning. Centrum Silver   Yes [provider]  Omega-3 Fatty Acids (FISH OIL) 1200 MG CAPS Take 1,200 mg by mouth in the morning. W/600 mg omega-3   Yes [provider]  oxybutynin (DITROPAN) 5 MG tablet Take 5 mg by mouth in the morning.   Yes [provider]  pantoprazole (PROTONIX) 40 MG tablet Take 1 tablet (40 mg total) by mouth daily. Patient taking differently: Take 40 mg by mouth in the morning. 04/29/18  Yes Gollan, Kathlene November, MD  potassium chloride (KLOR-CON) 10 MEQ tablet Take 1 tablet (10 mEq total) by mouth daily as needed. 08/14/20  Yes Minna Merritts, MD  spironolactone (ALDACTONE) 25 MG tablet Take 1 tablet (25 mg total) by mouth at bedtime. 08/14/20  Yes Gollan, Kathlene November, MD  Testosterone Cypionate 200 MG/ML KIT Inject 200 mg into the muscle every 14 (fourteen) days.   Yes [provider]  vitamin E 400 UNIT capsule Take 400 Units by mouth in the morning.   Yes [provider]  aspirin 81 MG chewable tablet Chew 1 tablet (81 mg total) by mouth 2 (two) times daily. 01/17/21   Cecilie Kicks, PA-C  docusate sodium (COLACE) 100 MG capsule Take 1 capsule (100 mg total) by mouth 2 (two) times daily as needed for mild constipation. 01/17/21   Cecilie Kicks, PA-C  metoprolol tartrate (LOPRESSOR) 25 MG tablet TAKE 1 TABLET BY MOUTH ONCE DAILY AT NIGHT Patient taking differently: Take 25 mg by mouth every evening. TAKE 1 TABLET  BY MOUTH ONCE DAILY AT NIGHT 08/14/20   Minna Merritts, MD  oxyCODONE (OXY IR/ROXICODONE) 5 MG immediate release tablet Take 1 tablet (5 mg total) by mouth every 4 (four) hours as needed for moderate pain (pain score 4-6). 01/17/21   Cecilie Kicks, PA-C  OXYGEN Inhale 2.5 L into the lungs at bedtime.    [provider]  polyethylene glycol (MIRALAX / GLYCOLAX) 17 g packet Mix 17 g with 8 ounces of liquid and take by mouth daily as needed for mild constipation. 01/17/21   Cecilie Kicks, PA-C    Current Facility-Administered Medications  Medication Dose Route Frequency Provider Last Rate Last Admin  . acetaminophen (TYLENOL) tablet 325-650 mg  325-650 mg Per Tube Q6H PRN Susa Day, MD      . allopurinol (ZYLOPRIM) tablet 100 mg  100 mg Per Tube QHS Susa Day, MD      . alum & mag hydroxide-simeth (MAALOX/MYLANTA) 200-200-20 MG/5ML suspension 30 mL  30 mL Per Tube Q4H PRN Susa Day, MD      . amiodarone (NEXTERONE PREMIX) 360-4.14 MG/200ML-% (1.8 mg/mL) IV infusion  30 mg/hr Intravenous Continuous Cecilie Lowers T, MD 16.67 mL/hr at 01/23/21 0840 30 mg/hr at 01/23/21 0840  . bisacodyl (DULCOLAX) EC tablet 5 mg  5 mg Oral Daily PRN Susa Day, MD   5 mg at 01/19/21 0921  . bisacodyl (DULCOLAX) suppository 10 mg  10 mg Rectal Daily PRN Jonelle Sidle D, PA   10 mg at 01/19/21 1442  . cefTRIAXone (ROCEPHIN) 2 g in sodium chloride 0.9 % 100 mL IVPB  2 g Intravenous Q24H Candee Furbish, MD 200 mL/hr at 01/23/21 0748 Infusion Verify at 01/23/21 0748  . Chlorhexidine Gluconate Cloth 2 % PADS 6 each  6 each Topical Daily Susa Day, MD   6 each at 01/23/21 1119  . heparin ADULT infusion 100 units/mL (25000 units/279m)  1,900 Units/hr Intravenous Continuous JAngela Adam RPH 19 mL/hr at 01/23/21 0748 1,900 Units/hr at 01/23/21 0748  . hydrocortisone sodium succinate (SOLU-CORTEF) 100 MG injection 100 mg  100 mg Intravenous Q12H SCandee Furbish MD   100 mg at  01/23/21 0555  . HYDROmorphone (DILAUDID) injection 0.5-1 mg  0.5-1 mg Intravenous Q4H PRN BSusa Day MD   1 mg at 01/17/21 1947  . insulin aspart (novoLOG) injection 0-15 Units  0-15 Units Subcutaneous Q4H MElmer Sow MD   2 Units at 01/23/21 0003  . insulin glargine (LANTUS) injection 8 Units  8 Units Subcutaneous Daily TEugenie Filler MD   8 Units at 01/23/21  1008  . lactated ringers infusion   Intravenous Continuous Eudelia Bunch, RPH 125 mL/hr at 01/23/21 6433 Infusion Verify at 01/23/21 0748  . lactated ringers infusion   Intravenous Continuous Eudelia Bunch, RPH      . MEDLINE mouth rinse  15 mL Mouth Rinse BID Susa Day, MD   15 mL at 01/23/21 1008  . menthol-cetylpyridinium (CEPACOL) lozenge 3 mg  1 lozenge Oral PRN Susa Day, MD       Or  . phenol (CHLORASEPTIC) mouth spray 1 spray  1 spray Mouth/Throat PRN Susa Day, MD      . metoCLOPramide (REGLAN) injection 10 mg  10 mg Intravenous Q6H Candee Furbish, MD   10 mg at 01/23/21 0555  . metoprolol tartrate (LOPRESSOR) injection 2.5-5 mg  2.5-5 mg Intravenous Q3H PRN Andres Labrum D, PA-C      . ondansetron [facility]) tablet 4 mg  4 mg Per Tube Q6H PRN Susa Day, MD       Or  . ondansetron (ZOFRAN) injection 4 mg  4 mg Intravenous Q6H PRN Susa Day, MD      . pantoprazole (PROTONIX) injection 40 mg  40 mg Intravenous Daily Eugenie Filler, MD   40 mg at 01/23/21 1007  . phenol (CHLORASEPTIC) mouth spray 1 spray  1 spray Mouth/Throat PRN Candee Furbish, MD   1 spray at 01/22/21 2234  . polyethylene glycol (MIRALAX / GLYCOLAX) packet 17 g  17 g Per Tube Daily PRN Susa Day, MD      . potassium chloride 10 mEq in 50 mL *CENTRAL LINE* IVPB  10 mEq Intravenous Q1 Hr x 3 Valin, Massie, PA-C 50 mL/hr at 01/23/21 1120 10 mEq at 01/23/21 1120  . TPN ADULT (ION)   Intravenous Continuous TPN Eudelia Bunch, RPH        Allergies as of 11/28/2020 - Review Complete 08/14/2020  Allergen Reaction  Noted  . Penicillins Hives and Other (See Comments) 01/30/2016    Family History  Problem Relation Age of Onset  . Heart disease Father   . Heart attack Father        x3  . Colon cancer Neg Hx   . Rectal cancer Neg Hx   . Stomach cancer Neg Hx   . Esophageal cancer Neg Hx     Social History   Socioeconomic History  . Marital status: Married                   Occupational History  . Not on file  Tobacco Use  . Smoking status: Never Smoker  . Smokeless tobacco: Never Used  Vaping Use  . Vaping Use: Never used  Substance and Sexual Activity  . Alcohol use: No  . Drug use: No   Review of Systems: Pertinent positive and negative review of systems were noted in the above HPI section.  All other review of systems was otherwise negative.  Physical Exam: Vital signs in last 24 hours: Temp:  [97.7 F (36.5 C)-98.5 F (36.9 C)] 98 F (36.7 C) (05/18 0859) Pulse Rate:  [81-196] 91 (05/18 1100) Resp:  [13-22] 16 (05/18 1100) BP: (98-162)/(38-75) 162/62 (05/18 1100) SpO2:  [92 %-99 %] 96 % (05/18 1100) Last BM Date: 01/19/21 General:   Alert,  Well-developed, well-nourished, elderly white male pleasant and cooperative in NAD family at bedside ,NG in place Head:  Normocephalic and atraumatic. Eyes:  Sclera clear, no icterus.   Conjunctiva pink. Ears:  Normal auditory  acuity. Nose:  No deformity, discharge,  or lesions. Mouth:  No deformity or lesions.   Neck:  Supple; no masses or thyromegaly. Lungs:  Clear throughout to auscultation.  Few basilar crackles bilaterally  heart:  Regular rate and rhythm; no murmurs, clicks, rubs,  or gallops. Abdomen: Protuberant, distended, very mild diffuse tenderness.  Bowel sounds present but decreased, he is tympanitic, no incisional scars, no mass or palpable hepatosplenomegaly.   Rectal: Not done Msk:  Symmetrical without gross deformities. . Pulses:  Normal pulses noted. Extremities: Left knee bandaged Neurologic:  Alert and   oriented x4;  grossly normal neurologically. Skin:  Intact without significant lesions or rashes.. Psych:  Alert and cooperative. Normal mood and affect, some pleasant confusion per family  Intake/Output from previous day: 05/17 0701 - 05/18 0700 In: 3297.9 [I.V.:2845.1; IV Piggyback:452.9] Out: 2915 [Urine:1665; Emesis/NG output:1250] Intake/Output this shift: Total I/O In: 1220.7 [I.V.:1220.7] Out: 700 [Urine:700]  Lab Results: Recent Labs    01/21/21 0330 01/22/21 0330 01/23/21 0454  WBC 6.1 6.0 8.5  HGB 10.8* 9.7* 10.0*  HCT 31.6* 28.5* 29.9*  PLT 159 138* 156   BMET Recent Labs    01/21/21 0330 01/22/21 0330 01/23/21 0730  NA 131* 132* 137  K 3.8 3.4* 3.1*  CL 94* 95* 98  CO2 '23 25 27  ' GLUCOSE 221* 161* 121*  BUN 94* 83* 73*  CREATININE 3.51* 2.85* 1.95*  CALCIUM 8.7* 8.6* 8.7*   LFT Recent Labs    01/20/21 1217  PROT 5.8*  ALBUMIN 2.7*  AST 40  ALT 36  ALKPHOS 53  BILITOT 2.0*   PT/INR Recent Labs    01/21/21 0330  LABPROT 15.5*  INR 1.2      IMPRESSION:  #71 75 year old white male status post left knee replacement 6/38/9373, course complicated by hypotension and respiratory distress, possible aspiration pneumonitis. He has improved, is off pressors.  Associated development of abdominal distention consistent with ileus versus small bowel obstruction. NG was placed about 3 days ago. CT imaging today consistent with ileus versus small bowel obstruction.  Abdominal films 2 days ago showed distended large and small bowel  I think it is more likely that he has an ileus postoperatively, than small bowel obstruction  #2 hypokalemia-needs corrected #3 adult onset diabetes mellitus 4.  History of hypertension 5.  Coronary artery disease 6.  Diverticulosis and history of adenomatous colon polyps.  Last colonoscopy October 2019  #7  2-year history of intermittent diarrhea-likely secondary to combination of medication induced symptoms and  IBS  PLAN: N.p.o. except ice chips NG to intermittent suction Avoid analgesics if at all possible Continue IV Reglan 10 mg every 6 hours Start MiraLAX 17 g in 8 ounces of water twice daily and clamp tube for 2 hours post each dose Mobilize patient Will give 2 tap water enemas this afternoon to stimulate bowel, may require further  depending on results Replace potassium and try to keep potassium closer to 4 TPN to start today  Thank you, we will follow with you   Amy EsterwoodPA-C  01/23/2021, 11:38 AM    [facility] Attending   I have taken an interval history, reviewed the chart and examined the patient. I agree with the Advanced Practitioner's note, impression and recommendations.   This appears to be a multifactorial ileus in the setting of previous shock, hypokalemia, immobilization etc.  Gatha Mayer, MD, [facility] 01/23/2021 6:03 PM

## 2021-01-23 NOTE — Progress Notes (Signed)
NGt remains in place. CPAP remains on hold for now. RN aware.

## 2021-01-23 NOTE — TOC Progression Note (Signed)
Transition of Care [facility]) - Progression Note    Patient Details  Name: [name] MRN: [id number] Date of Birth: Aug 26, 1946  [facility]) CM/SW Contact  Leeroy Cha, RN Phone Number: 01/23/2021, 7:51 AM  Clinical Narrative:    6 Days Post-Op Procedure(s) (LRB): TOTAL KNEE ARTHROPLASTY (Left) Up with therapy  Loosen knee immobilizer. Discussed with nursing Decreased Hgb possibly dilutional. No evidence of extremity bleed. UO improved. Cr decreased. Appreciate ICU team    Active Problems:   S/P TKR (total knee replacement) using cement   Acute respiratory distress   AKI (acute kidney injury) ([place])   Acute renal failure (HCC)   Constipation   Benign prostatic hyperplasia   OSA (obstructive sleep apnea)   Hypotension   Type 2 diabetes mellitus without complication ([place]) PLAN: to return to home with hhc for tkr and diabetes if needed will follow  Expected Discharge Plan: [place] Barriers to Discharge: Barriers Resolved  Expected Discharge Plan and Services Expected Discharge Plan: [place] In-house Referral: Clinical Social Work   Post Acute Care Choice: [place] arrangements for the past 2 months: [place] Expected Discharge Date: 01/20/21               DME Arranged: 3-N-1,[place] rolling DME Agency: [employer] Date DME Agency Contacted: 01/19/21 Time DME Agency Contacted: [id number] Representative spoke with at DME Agency: caroline [employer]: PT [place]: [facility]/[place] office) Date [place]: 01/18/21 Time [place]: 1000 Representative spoke with at [place]: [place] (ph# [phone]; fax # [phone])   Social Determinants of Health ([place]) Interventions    Readmission Risk Interventions Readmission Risk Prevention Plan 01/17/2021  Transportation Screening Complete  PCP or Specialist Appt within 5-7 Days Complete   Home Care Screening Complete  Some recent data might be hidden

## 2021-01-23 NOTE — Progress Notes (Signed)
[place] for Heparin Indication: AFib  Allergies  Allergen Reactions  . Penicillins Hives and Other (See Comments)    "cillins" Has patient had a PCN reaction causing immediate rash, facial/tongue/throat swelling, SOB or lightheadedness with hypotension: unknown Has patient had a PCN reaction causing severe rash involving mucus membranes or skin necrosis: unknown Has patient had a PCN reaction that required hospitalization no Has patient had a PCN reaction occurring within the last 10 years:no If all of the above answers are "NO", then may proceed with Cephalosporin use.     Patient Measurements: Height: 5\' 8"  (172.7 cm) Weight: 104.4 kg (230 lb 2.6 oz) IBW/kg (Calculated) : 68.4 Heparin Dosing Weight: 91 kg  Vital Signs: Temp: 97.9 F (36.6 C) (05/18 0300) Temp Source: Oral (05/18 0300) BP: 136/53 (05/18 0500) Pulse Rate: 89 (05/18 0500)  Labs: Recent Labs    01/20/21 1220 01/20/21 1730 01/20/21 2131 01/21/21 0330 01/21/21 0811 01/21/21 1936 01/22/21 0330 01/23/21 0454  HGB 13.4   < >  --  10.8*  --   --  9.7* 10.0*  HCT 39.1   < >  --  31.6*  --   --  28.5* 29.9*  PLT 223   < >  --  159  --   --  138* 156  LABPROT  --   --   --  15.5*  --   --   --   --   INR  --   --   --  1.2  --   --   --   --   HEPARINUNFRC  --   --   --   --    < > 0.35 0.36 0.25*  CREATININE  --   --  3.53* 3.51*  --   --  2.85*  --   TROPONINIHS 21*  --  23*  --   --   --   --   --    < > = values in this interval not displayed.    Estimated Creatinine Clearance: 26.2 mL/min (A) (by C-G formula based on SCr of 2.85 mg/dL (H)).  Medications:   No oral anticoagulation PTA  Heparin 5000 units sq q8h for VTE prophylaxis started 01/20/2021 (last dose charted @ 23:00 on 5/15)  Assessment:  75 y.o. male known to pharmacy for Vancomycin dosing   patient with new onset AFib with RVR.  Elevated D-Dimer  Concern for possible pulmonary embolism;  pharmacy consulted to dose IV heparin  S/P  Left TKA on 5/11  01/23/2021  HL 0.25 subtherapeutic on 1700 units/hr Hgb 10 plts up to WNL Per RN no bleeding or line interruptions   Goal of Therapy:  Heparin level 0.3-0.7 units/ml Monitor platelets by anticoagulation protocol: Yes   Plan:   Increase heparin drip to 1900 units/hr  Heparin level in 8 hours  Monitor for signs and symptoms of bleeding  Daily CBC and heparin level   Dolly Rias RPh 01/23/2021, 5:23 AM

## 2021-01-23 NOTE — Progress Notes (Addendum)
NAME:  Jarret Torre, MRN:  [id number], DOB:  09-24-1945, LOS: 7 ADMISSION DATE:  01/16/2021, CONSULTATION DATE:  01/20/21 REFERRING MD:  [place] Silos, CHIEF COMPLAINT:  SOB   History of Present Illness:  75 year old man with hx of SBO presenting for TKR complicated by ileus and now respiratory failure.  Per family (patient pretty out of it working to breathe), patient has been having slowly worsening respiratory status over last 24h.  Renal function also deteriorating.  As hypoxemia has worsened patient is being sent to unit with PCCM eval.  Pertinent  Medical History  OSA HTN HLD GERD DM  Significant Hospital Events: Including procedures, antibiotic start and stop dates in addition to other pertinent events   . 5/12 TKR . 5/15 deterioration, shock . 5/16 improving pressors . 5/17 stable pressors . 5/18 Off pressors. ileus remains present; CT abdomen: dilated small bowel loops in pelvis and lower abdomen concerning for ileus or distal SBO.  Interim History / Subjective:  Yesterday evening required Digoxin for Afib with rate in 110-120s No other events overnight Still no bowel movements and not passing gas  Objective   Blood pressure (!) 136/51, pulse 88, temperature 97.9 F (36.6 C), temperature source Oral, resp. rate 17, height 5\' 8"  (1.727 m), weight 104.4 kg, SpO2 97 %. CVP:  [9 mmHg-10 mmHg] 10 mmHg      Intake/Output Summary (Last 24 hours) at 01/23/2021 0719 Last data filed at 01/23/2021 0553 Gross per 24 hour  Intake 3297.94 ml  Output 2915 ml  Net 382.94 ml   Filed Weights   01/16/21 0650 01/20/21 1300  Weight: 102.5 kg 104.4 kg    Examination: General:  Elderly male in NAD HEENT: MM pink/moist, NG tube in place Neuro: Alert and oriented x4 CV: s1s2, RRR, grade 3 systolic murmur PULM:  Dim clear bs throughout; on nasal cannula 4 liters GI: soft, distended, hypoactive bs, mildly TTP throughout, tympanic to percussion Extremities: warm/dry, trace LE edema, able  to move all 4 extremities, left knee dressing in place with no erythema. Skin: no rashes or lesions appreciated  Labs/imaging that I havepersonally reviewed  (right click and "Reselect all SmartList Selections" daily)   Hgb 10  5/17: Na 132, K 3.4, Mag 4.3 BUN 83 from 94, Creatinine 2.85 from 3.51, GFR 22  Resolved Hospital Problem list   n/a  Assessment & Plan:  Acute hypoxemic respiratory failure: Likely due to ileus with aspiration pneumonitis and atelectasis. OSA on CPAP Plan: - Supplemental O2 as needed for sats >90% - Continue to hold CPAP due to NG tube and ileus - Nocturnal pulse oximetry - continue Ceftriaxone 5 days (Ends 5/20) - Repeat CXR in am - Pulmonary Toiletry with IS   ileus  Plan: - NGT in place - Continue Reglan - consider redosing relistor? - Plan for CT abdomen/pelvis to evaluate if decompressive colonoscopy is an option - Consider TPN - Avoid constipating meds  Septic and hypovolemic shock- stable Plan:  - Off pressors since 5/17 - Continue LR 125 cc/hr - MAP goal > 65 - Consider removing subclavian tomorrow if patient remains off pressors  S/p Left TKR Plan: - PT/OT following - Ortho following - Continue mobility as tolerated  Acute kidney injury related to poor PO, relative hypotension and losses with ileus Hypokalemia Plan: - Creatinine and BUN improving - Potassium given this morning: will recheck BMP this afternoon - Continue LR fluids - Avoid nephrotoxic agents and renal dose meds - Trend BMP  Afib RVR  Plan: - Continue Amio and Heparin - Intermittent digoxin - Will add prn lopressor  DM Plan: -SSI and basal insulin -CBG monitoring    Best practice (right click and "Reselect all SmartList Selections" daily)  Diet:  NPO Pain/Anxiety/Delirium protocol (if indicated): No VAP protocol (if indicated): Not indicated DVT prophylaxis: Systemic AC GI prophylaxis: PPI Glucose control:  SSI Yes and Basal insulin Yes Central  venous access:  Yes, and it is still needed Arterial line:  N/A Foley:  Yes, and it is still needed Mobility:  bed rest  PT consulted: Yes Last date of multidisciplinary goals of care discussion [n/a] Code Status:  full code Disposition: ICU pending shock resolution  Critical Care Time: 39  JD Rexene Agent [place] 01/23/2021, 8:27 AM  Please see [email or web address] for pager details.  From 7A-7P if no response, please call [phone]. After hours, please call ELink [phone].

## 2021-01-23 NOTE — Progress Notes (Signed)
PHARMACY - TOTAL PARENTERAL NUTRITION CONSULT NOTE   Indication: Prolonged ileus  Patient Measurements: Height: 5\' 8"  (172.7 cm) Weight: 104.4 kg (230 lb 2.6 oz) IBW/kg (Calculated) : 68.4 TPN AdjBW (KG): 77.4 Body mass index is 35 kg/m. 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 Post op course complicated by Afib, aspiration PNA and prolonged ileus.  Pharmacy consulted to manage TPN for nutrition support  Glucose / Insulin: DM2, on metformin PTA, currently on lantus 8 qday & SSI, Solucortef 100 IV q12; CBGs 104-180; 11 units SSI/24 hrs Electrolytes: K 3.1 - 3 runs given by CCM; phos 3,  Mg elevated 3.9; CoCa 9.74 Renal: AKI improving, Scr down to 1.95, BUN peak 94>>73 today Hepatic: LFT WNL 5/15 x T bili = 2 Intake / Output; MIVF: LR @ 125 ml/hr per MD S/p Relistor 5/15 & 5/17; on scheduled IV reglan & bisacodyl suppository daily I/O +  526.9; NGO 1300 ml/24 hrs; UOP 1665 ml/24 hrs GI Imaging: 5/18 CT A/P: Dilated small bowel loops are noted in the pelvis and lower abdomen concerning for ileus or distal small bowel obstruction GI Surgeries / Procedures:   Central access: has CVC, PICC ordered for TPN 5/18 TPN start date: 01/23/2021  Nutritional Goals (per RD recommendation pending): kCal: , Protein:, Fluid:  Rph goals 5/18: est Kcal 1456; est protein 136 gm Goal TPN rate is 100 mL/hr (provides 1824 g of protein and 132 kcals per day)  Current Nutrition:  NPO  Plan:  Start TPN at 40 mL/hr at 1800  Electrolytes in TPN: Na 7mEq/L, K 95mEq/L, Ca 76mEq/L, Mg 49mEq/L, and Phos 57mmol/L. Cl:Ac 1:1 Add standard MVI and trace elements to TPN continue Moderate q4h SSI and adjust as needed  Continue lantus 8 unitds qday per TRH Reduce MIVF to 85 mL/hr at 1800 to keep total IVF 125 ml/hr or per MD Monitor TPN labs on Mon/Thurs  Eudelia Bunch, Pharm.D 01/23/2021 11:42 AM

## 2021-01-23 NOTE — Progress Notes (Signed)
[place] for Heparin Indication: AFib  Allergies  Allergen Reactions  . Penicillins Hives and Other (See Comments)    "cillins" Has patient had a PCN reaction causing immediate rash, facial/tongue/throat swelling, SOB or lightheadedness with hypotension: unknown Has patient had a PCN reaction causing severe rash involving mucus membranes or skin necrosis: unknown Has patient had a PCN reaction that required hospitalization no Has patient had a PCN reaction occurring within the last 10 years:no If all of the above answers are "NO", then may proceed with Cephalosporin use.     Patient Measurements: Height: 5\' 8"  (172.7 cm) Weight: 104.4 kg (230 lb 2.6 oz) IBW/kg (Calculated) : 68.4 Heparin Dosing Weight: 91 kg  Vital Signs: Temp: 98.1 F (36.7 C) (05/18 1151) Temp Source: Oral (05/18 1151) BP: 150/56 (05/18 1200) Pulse Rate: 93 (05/18 1200)  Labs: Recent Labs    01/20/21 2131 01/21/21 0330 01/21/21 0811 01/22/21 0330 01/23/21 0454 01/23/21 0730 01/23/21 1410  HGB  --  10.8*  --  9.7* 10.0*  --   --   HCT  --  31.6*  --  28.5* 29.9*  --   --   PLT  --  159  --  138* 156  --   --   LABPROT  --  15.5*  --   --   --   --   --   INR  --  1.2  --   --   --   --   --   HEPARINUNFRC  --   --    < > 0.36 0.25*  --  0.32  CREATININE 3.53* 3.51*  --  2.85*  --  1.95* 1.86*  TROPONINIHS 23*  --   --   --   --   --   --    < > = values in this interval not displayed.    Estimated Creatinine Clearance: 40.2 mL/min (A) (by C-G formula based on SCr of 1.86 mg/dL (H)).  Medications:   No oral anticoagulation PTA  Heparin 5000 units sq q8h for VTE prophylaxis started 01/20/2021 (last dose charted @ 23:00 on 5/15)  Assessment:  75 y.o. male known to pharmacy for Vancomycin dosing   patient with new onset AFib with RVR.  Elevated D-Dimer  Concern for possible pulmonary embolism; pharmacy consulted to dose IV heparin  S/P  Left TKA on  5/11  01/23/2021  HL 0.32 therapeutic after heparin drip increased to 1900 units/hr this morning Hgb 10 plts up to WNL Per RN no bleeding or line interruptions   Goal of Therapy:  Heparin level 0.3-0.7 units/ml Monitor platelets by anticoagulation protocol: Yes   Plan:   continue heparin drip at 1900 units/hr  Monitor for signs and symptoms of bleeding  Daily CBC and heparin level   Eudelia Bunch, Pharm.D 01/23/2021 2:45 PM

## 2021-01-23 NOTE — Progress Notes (Signed)
Initial Nutrition Assessment  DOCUMENTATION CODES:   Obesity unspecified  INTERVENTION:  - TPN initiation and advancement per Pharmacist. - diet advancement as medically feasible.   NUTRITION DIAGNOSIS:   Inadequate oral intake related to inability to eat as evidenced by NPO status.  GOAL:   Patient will meet greater than or equal to 90% of their needs  MONITOR:   Diet advancement,Labs,Weight trends,I & O's,Other (Comment) (TPN regimen)  REASON FOR ASSESSMENT:   Consult New TPN/TNA  ASSESSMENT:   75 year old male with medical history of DM, HLD, lymphoma, HTN, GERD, hypotestosteronemia, and sleep apnea. 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 He underwent L TKA on 5/11.  Patient laying in bed with wife and son at bedside. Patient was discussed in rounds this AM; RD requested TPN initiation for patient who remains NPO post-op and who continues to show SBO vs ileus on repeat CT. Able to talk with Pharmacist after rounds. Able to talk with RN prior to entering patient's room.  RN reports patient is tired from CT. Two providers were in patient's room talking with him and family prior to RD visit. Patient was fairly drowsy during RD visit but did report throat pain and burning eyes.  Rationale for TPN explained to patient and family; family reports understanding and appreciation.   Weight on 5/15 was 230 lb and weight on 5/11 was 226 lb. Weight has been stable since 04/09/18. Non-pitting edema to BLE documented in the edema section of flow sheet.   Plan for L IJ central line to be exchanged for a PICC and for TPN to begin this evening.   Labs reviewed; CBGs: 104, 119, 127 mg/dl, K: 3.1 mmol/l, BUN: 73 mg/dl, creatinine: 1.95 mg/dl, Ca: 8.7 mg/dl, GFR: 35 ml/min. Medications reviewed; 100 mg solu-cortef BID, sliding scale novolog, 8 units lantus/day, 10 mg reglan QID, 40 mg IV protonix/day, 17 g miralax BID, 10 mEq IV KCl x3 runs 5/18.  IVF; LR @ 125 ml/hr  to decrease to 85 ml/hr today at 1800.     NUTRITION - FOCUSED PHYSICAL EXAM:  completed to upper body only; no muscle or fat depletions.   Diet Order:   Diet Order            Diet NPO time specified Except for: Ice Chips  Diet effective now           Diet - low sodium heart healthy           Diet - low sodium heart healthy                 EDUCATION NEEDS:   Education needs have been addressed  Skin:  Skin Assessment: Skin Integrity Issues: Skin Integrity Issues:: Incisions Incisions: L knee (5/11)  Last BM:  5/14  Height:   Ht Readings from Last 1 Encounters:  01/20/21 5\' 8"  (1.727 m)    Weight:   Wt Readings from Last 1 Encounters:  01/20/21 104.4 kg    Estimated Nutritional Needs:  Kcal:  1880-2090 kcal Protein:  90-100 grams Fluid:  >/= 2 L/day       Jarome Matin, MS, RD, LDN, CNSC Inpatient Clinical Dietitian RD pager # available in [place]  After hours/weekend pager # available in [facility]

## 2021-01-23 NOTE — Progress Notes (Signed)
Physical Therapy Treatment Patient Details Name: [name] MRN: [id number] DOB: Jul 02, 1946 Today's Date: 01/23/2021    History of Present Illness Pt is a 75 year old male s/p left TKA on 01/16/21. Transferred to ICU on 5/15 due to respiratory failure, ileus, kidney failure    PT Comments    Noted CT showed probable ileus today, and mild atelectasis L more than R. +2 assist for supine to sit and then to take several pivotal steps to recliner. Pt performed L TKA exercises with min assist. Pt was somewhat lethargic, eyes were closed much of session, but he was responsive to commands and put forth good effort. Instructed pt/spouse and RN/NT in knee precautions, esp to keep foot of bed flat to minimize risk of knee flexion contracture.    Follow Up Recommendations  Follow surgeon's recommendation for DC plan and follow-up therapies;SNF (may need SNF)     Equipment Recommendations  Rolling walker with 5" wheels;Wheelchair (measurements PT)    Recommendations for Other Services       Precautions / Restrictions Precautions Precautions: Fall;Knee Precaution Comments: NG tube, monitor HR/BP, on pressors; instructed pt/spouse in no pillow under knee. Legs of bed were elevated upon my arrival so L knee was in flexed position. Instructed pt/spouse and RN/NT to keep foot of bed flat. Sign hung at head of bed and written on white board to keep foot of bed flat. Required Braces or Orthoses: Knee Immobilizer - Left Knee Immobilizer - Left: On when out of bed or walking Restrictions LLE Weight Bearing: Weight bearing as tolerated    Mobility  Bed Mobility Overal bed mobility: Needs Assistance       Supine to sit: +2 for physical assistance;Max assist     General bed mobility comments: HOB elevated, needing assist to manage L LE to edge of bed as well as max A  x2 for raising trunk    Transfers Overall transfer level: Needs assistance Equipment used: Rolling walker (2  wheeled) Transfers: Sit to/from [facility] Sit to Stand: Mod assist;+2 safety/equipment;+2 physical assistance;From elevated surface Stand pivot transfers: Min assist;+2 safety/equipment;+2 physical assistance       General transfer comment: assist to rise, VCs hand placement, VCs sequencing with RW for taking a few pivotal steps to recliner  Ambulation/Gait Ambulation/Gait assistance: Min guard Gait Distance (Feet): 3 Feet Assistive device: Rolling walker (2 wheeled) Gait Pattern/deviations: Step-to pattern;Decreased stance time - left;Antalgic;Trunk flexed Gait velocity: decr   General Gait Details: verbal cues for sequence, RW positioning, posture; improved gait pattern compared to yesterday; pt reports pain is tolerable and denies dizziness; distance limited by fatigue, pt kept eyes closed most of session, frequent VCs to open his eyes   Stairs             Wheelchair Mobility    Modified Rankin (Stroke Patients Only)       Balance Overall balance assessment: Needs assistance Sitting-balance support: Feet supported;Single extremity supported Sitting balance-Leahy Scale: Fair Sitting balance - Comments: UE support   Standing balance support: Bilateral upper extremity supported Standing balance-Leahy Scale: Poor Standing balance comment: reliant on support                            Cognition Arousal/Alertness: Lethargic Behavior During Therapy: WFL for tasks assessed/performed Overall Cognitive Status: Within Functional Limits for tasks assessed  General Comments: kept eyes closed much of the time, but verbally responsive, is oriented and follows commands      Exercises Total Joint Exercises Ankle Circles/Pumps: AROM;10 reps;Both;Supine Quad Sets: AROM;Left;10 reps;Supine Short Arc Quad: AAROM;Left;10 reps;Supine Heel Slides: AAROM;Left;Supine;15 reps Hip ABduction/ADduction:  AROM;Left;10 reps;Supine Straight Leg Raises: AAROM;Left;10 reps;Supine    General Comments        Pertinent Vitals/Pain Pain Score: 0-No pain    Home Living                      Prior Function            PT Goals (current goals can now be found in the care plan section) Acute Rehab PT Goals Patient Stated Goal: agreeable getting to chair, puts forth good effort PT Goal Formulation: With patient Time For Goal Achievement: 02/05/21 Potential to Achieve Goals: Good Progress towards PT goals: Progressing toward goals    Frequency    7X/week      PT Plan Discharge plan needs to be updated    Co-evaluation              AM-PAC PT "6 Clicks" Mobility   Outcome Measure  Help needed turning from your back to your side while in a flat bed without using bedrails?: A Lot Help needed moving from lying on your back to sitting on the side of a flat bed without using bedrails?: Total Help needed moving to and from a bed to a chair (including a wheelchair)?: Total Help needed standing up from a chair using your arms (e.g., wheelchair or bedside chair)?: Total Help needed to walk in hospital room?: Total Help needed climbing 3-5 steps with a railing? : Total 6 Click Score: 7    End of Session Equipment Utilized During Treatment: Gait belt;Left knee immobilizer Activity Tolerance: Patient tolerated treatment well;Patient limited by fatigue Patient left: in chair;with call bell/phone within reach;with family/visitor present;with chair alarm set;with nursing/sitter in room Nurse Communication: Mobility status PT Visit Diagnosis: Other abnormalities of gait and mobility (R26.89)     Time: 7209-4709 PT Time Calculation (min) (ACUTE ONLY): 35 min  Charges:  $Therapeutic Exercise: 8-22 mins $Therapeutic Activity: 8-22 mins                     Blondell Reveal Kistler PT 01/23/2021  Acute Rehabilitation Services Pager [phone] Office [phone]

## 2021-01-24 ENCOUNTER — Inpatient Hospital Stay (HOSPITAL_COMMUNITY): Payer: Medicare Other

## 2021-01-24 DIAGNOSIS — N179 Acute kidney failure, unspecified: Secondary | ICD-10-CM | POA: Diagnosis not present

## 2021-01-24 DIAGNOSIS — K567 Ileus, unspecified: Secondary | ICD-10-CM | POA: Diagnosis not present

## 2021-01-24 LAB — COMPREHENSIVE METABOLIC PANEL
ALT: 18 U/L (ref 0–44)
AST: 17 U/L (ref 15–41)
Albumin: 2.7 g/dL — ABNORMAL LOW (ref 3.5–5.0)
Alkaline Phosphatase: 42 U/L (ref 38–126)
Anion gap: 7 (ref 5–15)
BUN: 62 mg/dL — ABNORMAL HIGH (ref 8–23)
CO2: 29 mmol/L (ref 22–32)
Calcium: 8.6 mg/dL — ABNORMAL LOW (ref 8.9–10.3)
Chloride: 102 mmol/L (ref 98–111)
Creatinine, Ser: 1.63 mg/dL — ABNORMAL HIGH (ref 0.61–1.24)
GFR, Estimated: 44 mL/min — ABNORMAL LOW (ref >60)
Glucose, Bld: 160 mg/dL — ABNORMAL HIGH (ref 70–99)
Potassium: 3 mmol/L — ABNORMAL LOW (ref 3.5–5.1)
Sodium: 138 mmol/L (ref 135–145)
Total Bilirubin: 1.1 mg/dL (ref 0.3–1.2)
Total Protein: 5.6 g/dL — ABNORMAL LOW (ref 6.5–8.1)

## 2021-01-24 LAB — DIFFERENTIAL
Abs Immature Granulocytes: 2.46 10*3/uL — ABNORMAL HIGH (ref 0.00–0.07)
Basophils Absolute: 0 10*3/uL (ref 0.0–0.1)
Basophils Relative: 0 %
Eosinophils Absolute: 0 10*3/uL (ref 0.0–0.5)
Eosinophils Relative: 0 %
Immature Granulocytes: 18 %
Lymphocytes Relative: 9 %
Lymphs Abs: 1.2 10*3/uL (ref 0.7–4.0)
Monocytes Absolute: 0.7 10*3/uL (ref 0.1–1.0)
Monocytes Relative: 5 %
Neutro Abs: 9.2 10*3/uL — ABNORMAL HIGH (ref 1.7–7.7)
Neutrophils Relative %: 68 %
WBC Morphology: ABNORMAL

## 2021-01-24 LAB — PREALBUMIN: Prealbumin: 10 mg/dL — ABNORMAL LOW (ref 18–38)

## 2021-01-24 LAB — MAGNESIUM: Magnesium: 3.1 mg/dL — ABNORMAL HIGH (ref 1.7–2.4)

## 2021-01-24 LAB — CBC
HCT: 31.5 % — ABNORMAL LOW (ref 39.0–52.0)
Hemoglobin: 10.5 g/dL — ABNORMAL LOW (ref 13.0–17.0)
MCH: 32 pg (ref 26.0–34.0)
MCHC: 33.3 g/dL (ref 30.0–36.0)
MCV: 96 fL (ref 80.0–100.0)
Platelets: 176 10*3/uL (ref 150–400)
RBC: 3.28 MIL/uL — ABNORMAL LOW (ref 4.22–5.81)
RDW: 14.7 % (ref 11.5–15.5)
WBC: 13.7 10*3/uL — ABNORMAL HIGH (ref 4.0–10.5)
nRBC: 0 % (ref 0.0–0.2)

## 2021-01-24 LAB — GLUCOSE, CAPILLARY
Glucose-Capillary: 152 mg/dL — ABNORMAL HIGH (ref 70–99)
Glucose-Capillary: 161 mg/dL — ABNORMAL HIGH (ref 70–99)
Glucose-Capillary: 164 mg/dL — ABNORMAL HIGH (ref 70–99)
Glucose-Capillary: 152 mg/dL — ABNORMAL HIGH (ref 70–99)
Glucose-Capillary: 209 mg/dL — ABNORMAL HIGH (ref 70–99)

## 2021-01-24 LAB — TRIGLYCERIDES: Triglycerides: 98 mg/dL (ref <150)

## 2021-01-24 LAB — HEPARIN LEVEL (UNFRACTIONATED): Heparin Unfractionated: 0.3 IU/mL (ref 0.30–0.70)

## 2021-01-24 LAB — PHOSPHORUS: Phosphorus: 2.5 mg/dL (ref 2.5–4.6)

## 2021-01-24 MED ORDER — HEPARIN (PORCINE) 25000 UT/250ML-% IV SOLN
2050.0000 [IU]/h | INTRAVENOUS | Status: DC
  Administered 2021-01-24: 1950 [IU]/h via INTRAVENOUS
  Administered 2021-01-25: 2050 [IU]/h via INTRAVENOUS
  Filled 2021-01-24 (×2): qty 250

## 2021-01-24 MED ORDER — SODIUM CHLORIDE 0.9 % IV SOLN: INTRAVENOUS | Status: DC

## 2021-01-24 MED ORDER — SODIUM CHLORIDE 0.9% FLUSH
10.0000 mL | Freq: Two times a day (BID) | INTRAVENOUS | Status: DC
  Administered 2021-01-24: 10 mL
  Administered 2021-01-25: 40 mL
  Administered 2021-01-25: 20 mL
  Administered 2021-01-26: 40 mL
  Administered 2021-01-26: 10 mL
  Administered 2021-01-27: 40 mL
  Administered 2021-01-27: 30 mL
  Administered 2021-01-28: 10 mL
  Administered 2021-01-28 – 2021-01-29 (×2): 40 mL
  Administered 2021-01-29 – 2021-01-30 (×2): 10 mL
  Administered 2021-01-30: 20 mL
  Administered 2021-01-31 – 2021-02-01 (×3): 10 mL
  Administered 2021-02-02: 20 mL
  Administered 2021-02-03: 10 mL
  Administered 2021-02-05: 30 mL

## 2021-01-24 MED ORDER — DIATRIZOATE MEGLUMINE & SODIUM 66-10 % PO SOLN
90.0000 mL | Freq: Once | ORAL | Status: AC
  Administered 2021-01-24: 90 mL via NASOGASTRIC
  Filled 2021-01-24: qty 90

## 2021-01-24 MED ORDER — SODIUM CHLORIDE 0.9% FLUSH
10.0000 mL | INTRAVENOUS | Status: DC | PRN
Start: 2021-01-24 — End: 2021-02-06
  Administered 2021-01-25 – 2021-01-30 (×2): 10 mL
  Administered 2021-02-02: 30 mL

## 2021-01-24 MED ORDER — HYDROXYZINE HCL 25 MG PO TABS
25.0000 mg | ORAL_TABLET | Freq: Once | ORAL | Status: AC
  Administered 2021-01-24: 25 mg via ORAL
  Filled 2021-01-24: qty 1

## 2021-01-24 MED ORDER — POTASSIUM CHLORIDE 10 MEQ/50ML IV SOLN
10.0000 meq | INTRAVENOUS | Status: AC
  Administered 2021-01-24 (×4): 10 meq via INTRAVENOUS
  Filled 2021-01-24 (×7): qty 50

## 2021-01-24 MED ORDER — POTASSIUM CHLORIDE 10 MEQ/50ML IV SOLN: 10.0000 meq | INTRAVENOUS | Status: DC

## 2021-01-24 MED ORDER — TRAVASOL 10 % IV SOLN
INTRAVENOUS | Status: AC
  Filled 2021-01-24: qty 1056

## 2021-01-24 NOTE — Progress Notes (Signed)
PHARMACY - TOTAL PARENTERAL NUTRITION CONSULT NOTE   Indication: Prolonged ileus  Patient Measurements: Height: 5\' 8"  (172.7 cm) Weight: 109.1 kg (240 lb 8.4 oz) IBW/kg (Calculated) : 68.4 TPN AdjBW (KG): 77.4 Body mass index is 36.57 kg/m. 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 Post op course complicated by Afib, aspiration PNA and prolonged ileus. Pharmacy consulted to manage TPN for nutrition support.  Glucose / Insulin: Hx DM2 on only metformin PTA  Currently on lantus 8 qday & moderate SSI; also getting Solumedrol 100 mg bid  CBGs well controlled today despite steroids and DM  Required 10 units SSI yesterday Electrolytes: K remains low despite repletion yesterday (30 meq); Mg still elevated but trending down; Phos decreased to borderline low with improved CrCl; Na, Cl, Ca all stable WNL Renal: Postop AKI w/ SCr peaking at 4 (baseline 1.4) continues to improve but still slightly elevated above BL; BUN elevated but trending down; bicarb WNL but trending up; UOP improved to >1 ml/kg/hr Hepatic: LFTs stable WNL; mild bump in tbili resolved I/O:  MIVF: LR @ 85 ml/hr  Relistor x 2 doses; scheduled IV reglan q6h & miralax bid  NG output down from previous; only clamping after meds GI Imaging:  - 5/18 CT A/P: ileus vs distal SBO GI Surgeries / Procedures: none  Central access: CVC; PICC ordered but not placed yesterday; IV team planning to place today TPN start date: 01/23/21  Nutritional Goals (RD recommendations 5/18): Kcal:  1880-2090 kcal Protein:  90-100 grams Fluid:  >/= 2 L/day  Current TPN at goal rate of 80 mL/hr provides 106 g of protein, 1978 kcals per day  Keep K >/= 4 and Mg >/=2 with ileus  Current Nutrition:  NPO  Plan:   KCl 10 mEq IV x 6 per CCM  Hold off on any additional K and Phos given increasing TPN rate   Increase TPN to 82mL/hr at 1800  Electrolytes in TPN:  Na - 51mEq/L  K - 96mEq/L  Ca -  81mEq/L  Mg - 66mEq/L  Phos - 39mmol/L  Cl:Ac ratio: max Cl  Add standard MVI and trace elements to TPN  Continue moderate scale SSI q4h and adjust as needed   Reduce MIVF to NS at [facility] at 1800  Monitor TPN labs on Mon/Thurs  Bmet, Mg, Phos tomorrow  Reuel Boom, PharmD, BCPS [phone] 01/24/2021, 8:39 AM

## 2021-01-24 NOTE — Progress Notes (Signed)
Occupational Therapy Treatment Patient Details Name: [name] MRN: [id number] DOB: 1946/01/15 Today's Date: 01/24/2021    History of present illness Pt is a 75 year old male s/p left TKA on 01/16/21. Transferred to ICU on 5/15 due to respiratory failure, ileus, kidney failure   OT comments  Treatment focused on promoting any activity of upper extremities, assistance with transfer out of bed, functional mobility and grooming task in chair. Patient able to stand and ambulate with +2 assistance and chair follow to outside of doorway with encouragement, verbal cues for technique and hand placement. Improved ability to groom self and feed self ice chips and move arms. Continue to recommend SNF at discharge.   Follow Up Recommendations  SNF    Equipment Recommendations  3 in 1 bedside commode    Recommendations for Other Services      Precautions / Restrictions Precautions Precautions: Fall;Knee Precaution Comments: NG tube, monitor HR/BP Required Braces or Orthoses: Knee Immobilizer - Right Knee Immobilizer - Left: On when out of bed or walking Restrictions Weight Bearing Restrictions: No LLE Weight Bearing: Weight bearing as tolerated       Mobility Bed Mobility Overal bed mobility: Needs Assistance Bed Mobility: Supine to Sit     Supine to sit: Mod assist;+2 for physical assistance;HOB elevated;+2 for safety/equipment     General bed mobility comments: Assistance for LEs, trunk negotiation and use of bed pad to pivot to side of bed. [place] using bed rails to assist.    Transfers Overall transfer level: Needs assistance Equipment used: Rolling walker (2 wheeled) Transfers: Sit to/from Stand Sit to Stand: Mod assist;+2 safety/equipment;+2 physical assistance;From elevated surface         General transfer comment: Mod x 2 for standing from bed height and from recliner. Able to ambulate in room with RW with +2 assistants and chair follow for safety. Verbal cues for  hand placement and to keep his eyes open. Fatigues quicky. vittal signs monitored and WFL.    Balance Overall balance assessment: Needs assistance Sitting-balance support: Feet supported;Single extremity supported Sitting balance-Leahy Scale: Fair Sitting balance - Comments: UE support   Standing balance support: Bilateral upper extremity supported Standing balance-Leahy Scale: Poor Standing balance comment: reliant on support                           ADL either performed or assessed with clinical judgement   ADL Overall ADL's : Needs assistance/impaired     Grooming: Set up;Sitting Grooming Details (indicate cue type and reason): washed face grossly with wash cloth after ambulating - limited by fatigue                                     Vision Patient Visual Report: No change from baseline     Perception     Praxis      Cognition Arousal/Alertness: Awake/alert Behavior During Therapy: WFL for tasks assessed/performed Overall Cognitive Status: Within Functional Limits for tasks assessed                                 General Comments: has a tendency to keep eyes closed, but verbally responsive, is oriented and follows commands        Exercises Other Exercises Other Exercises: Overhead arm raises x 10 bilaterally   Shoulder Instructions  General Comments      Pertinent Vitals/ Pain       Pain Assessment: Faces Faces Pain Scale: Hurts even more Pain Location: left knee (after ambulation) Pain Descriptors / Indicators: Aching;Sore;Grimacing Pain Intervention(s): Limited activity within patient's tolerance;Repositioned  Home Living                                          Prior Functioning/Environment              Frequency  Min 2X/week        Progress Toward Goals  OT Goals(current goals can now be found in the care plan section)  Progress towards OT goals: Progressing toward  goals  Acute Rehab OT Goals Patient Stated Goal: to go home to wife OT Goal Formulation: With patient Time For Goal Achievement: 02/05/21 Potential to Achieve Goals: Good  Plan Discharge plan remains appropriate    Co-evaluation    PT/OT/SLP Co-Evaluation/Treatment: Yes Reason for Co-Treatment: For patient/therapist safety;To address functional/ADL transfers;Complexity of the patient's impairments (multi-system involvement) PT goals addressed during session: Mobility/safety with mobility OT goals addressed during session: Strengthening/ROM;ADL's and self-care (functional mobility)      AM-PAC OT "6 Clicks" Daily Activity     Outcome Measure   Help from another person eating meals?: None (NPO but able to feed himself ice chips) Help from another person taking care of personal grooming?: A Little Help from another person toileting, which includes using toliet, bedpan, or urinal?: Total Help from another person bathing (including washing, rinsing, drying)?: A Lot Help from another person to put on and taking off regular upper body clothing?: A Little Help from another person to put on and taking off regular lower body clothing?: Total 6 Click Score: 14    End of Session Equipment Utilized During Treatment: Gait belt;Rolling walker;Left knee immobilizer;Oxygen  OT Visit Diagnosis: Unsteadiness on feet (R26.81);Other abnormalities of gait and mobility (R26.89);Muscle weakness (generalized) (M62.81);Pain Pain - Right/Left: Left   Activity Tolerance Patient tolerated treatment well   Patient Left in chair;with call bell/phone within reach;with chair alarm set;with nursing/sitter in room;with family/visitor present   Nurse Communication Mobility status        Time: 4680-3212 OT Time Calculation (min): 31 min  Charges: OT General Charges $OT Visit: 1 Visit OT Treatments $Therapeutic Activity: 8-22 mins  Hayle Parisi, OTR/L [place]  Office [phone] Pager:  [place] 01/24/2021, 2:22 PM

## 2021-01-24 NOTE — Progress Notes (Signed)
PROGRESS NOTE  [name] YSA:[id number] DOB: 1946-01-22 DOA: 01/16/2021 PCP: Bing Neighbors, NP  HPI/Recap of past 26 hours: 75 year old man with hx of SBO presenting for TKR complicated by ileus and now respiratory failure.  Patient has been having slowly worsening respiratory status over last 24h.  Renal function also deteriorating.  As hypoxemia has worsened patient was sent to the ICU with PCCM evaluation.  TRH was consulted to assist with the medical management.  01/24/21: Patient was seen and examined at bedside.  He was somnolent but easily arousable to voices.  At the time of this visit he denied any left knee pain.  NG tube was in place to suction.  Assessment/Plan: Active Problems:   S/P TKR (total knee replacement) using cement   Acute respiratory distress   AKI (acute kidney injury) ([place])   Acute renal failure (HCC)   Constipation   Benign prostatic hyperplasia   OSA (obstructive sleep apnea)   Hypotension   Type 2 diabetes mellitus without complication (HCC)   Ileus (HCC)  Status post total knee replacement Management per primary  Small bowel obstruction NG tube to suction Continue IV fluid Replace electrolytes as indicated.  Acute hypoxic respiratory failure secondary to atelectasis versus infiltrates. Maintain O2 saturation greater than 92%. Treat underlying conditions Elevated procalcitonin, WBC 13,000 He is on Rocephin empirically. Repeat procalcitonin level and CBC in the morning  Permanent A. fib with RVR Rate is currently controlled N.p.o. IV Lopressor as needed with parameters IV heparin as CVA prevention since NPO  Electrolyte derangement on TPN Hypokalemia, repleted with TPN Continue TPN  Elevated troponin Peaked at 23 Is currently on heparin drip  Prediabetes with hyperglycemia likely exacerbated by IV steroids Hemoglobin A1c is 5.7 Continue current management with Lantus 8 units daily and insulin sliding scale. Is currently on IV  Solu-Cortef 100 mg twice daily Continue to monitor CBGs and treat hyperglycemia as indicated  Gout/GERD Stable Resume home regimen  Essential hypertension BP is currently not at goal He is n.p.o. Added IV Lopressor as needed with parameters. Continue to monitor vital signs.   Code Status: Full code.  Family Communication: None at bedside.  Disposition Plan: Defer to primary team.   Thank you for allowing [place] to participate in the care of this patient.  We will continue to follow along with you.     Objective: Vitals:   01/24/21 0900 01/24/21 1000 01/24/21 1100 01/24/21 1200  BP: (!) 158/70  (!) 168/63   Pulse: 90 93 95 97  Resp: 20 17 18  (!) 24  Temp:    97.7 F (36.5 C)  TempSrc:    Oral  SpO2: 95% 95% 96% 95%  Weight:      Height:        Intake/Output Summary (Last 24 hours) at 01/24/2021 1602 Last data filed at 01/24/2021 1200 Gross per 24 hour  Intake 3757.17 ml  Output 2200 ml  Net 1557.17 ml   Filed Weights   01/16/21 0650 01/20/21 1300 01/23/21 1122  Weight: 102.5 kg 104.4 kg 109.1 kg    Exam:  . General: 75 y.o. year-old male well developed well nourished in no acute distress.  Somnolent but easily arousable to voices.  NG tube in place. . Cardiovascular: Irregular rate and rhythm with no rubs or gallops.  No thyromegaly or JVD noted.   Marland Kitchen Respiratory: Clear to auscultation with no wheezes or rales. Good inspiratory effort. . Abdomen: Soft nontender nondistended with normal bowel sounds x4  quadrants. . Musculoskeletal: Surgical dressing on left knee. . Skin: No ulcerative lesions noted or rashes . Psychiatry: Unable to assess mood due to somnolence.   Data Reviewed: CBC: Recent Labs  Lab 01/20/21 0401 01/20/21 1220 01/20/21 1730 01/21/21 0330 01/22/21 0330 01/23/21 0454 01/24/21 0338  WBC 6.6 7.4 8.1 6.1 6.0 8.5 13.7*  NEUTROABS 4.6 5.2  --  4.6  --   --  9.2*  HGB 14.4 13.4 12.0* 10.8* 9.7* 10.0* 10.5*  HCT 42.6 39.1 34.7* 31.6* 28.5*  29.9* 31.5*  MCV 94.7 94.2 93.3 94.3 95.6 93.7 96.0  PLT 227 223 226 159 138* 156 195   Basic Metabolic Panel: Recent Labs  Lab 01/20/21 0401 01/20/21 1217 01/20/21 1220 01/20/21 2131 01/21/21 0330 01/22/21 0330 01/23/21 0730 01/23/21 1410 01/24/21 0338  NA 132*   < >  --    < > 131* 132* 137 139 138  K 4.3   < >  --    < > 3.8 3.4* 3.1* 3.5 3.0*  CL 93*   < >  --    < > 94* 95* 98 99 102  CO2 26   < >  --    < > 23 25 27 26 29   GLUCOSE 208*   < >  --    < > 221* 161* 121* 125* 160*  BUN 79*   < >  --    < > 94* 83* 73* 66* 62*  CREATININE 3.24*   < >  --    < > 3.51* 2.85* 1.95* 1.86* 1.63*  CALCIUM 9.6   < >  --    < > 8.7* 8.6* 8.7* 8.9 8.6*  MG 3.6*  --  4.1*  --   --  4.3* 3.9*  --  3.1*  PHOS 6.0*  --   --   --   --   --  3.0  --  2.5   < > = values in this interval not displayed.   GFR: Estimated Creatinine Clearance: 46.9 mL/min (A) (by C-G formula based on SCr of 1.63 mg/dL (H)). Liver Function Tests: Recent Labs  Lab 01/20/21 0401 01/20/21 1217 01/24/21 0338  AST  --  40 17  ALT  --  36 18  ALKPHOS  --  53 42  BILITOT  --  2.0* 1.1  PROT  --  5.8* 5.6*  ALBUMIN 3.4* 2.7* 2.7*   No results for input(s): LIPASE, AMYLASE in the last 168 hours. No results for input(s): AMMONIA in the last 168 hours. Coagulation Profile: Recent Labs  Lab 01/21/21 0330  INR 1.2   Cardiac Enzymes: No results for input(s): CKTOTAL, CKMB, CKMBINDEX, TROPONINI in the last 168 hours. BNP (last 3 results) No results for input(s): PROBNP in the last 8760 hours. HbA1C: No results for input(s): HGBA1C in the last 72 hours. CBG: Recent Labs  Lab 01/23/21 1951 01/23/21 2323 01/24/21 0338 01/24/21 0810 01/24/21 1227  GLUCAP 147* 171* 152* 161* 164*   Lipid Profile: Recent Labs    01/24/21 0338  TRIG 98   Thyroid Function Tests: No results for input(s): TSH, T4TOTAL, FREET4, T3FREE, THYROIDAB in the last 72 hours. Anemia Panel: No results for input(s): VITAMINB12,  FOLATE, FERRITIN, TIBC, IRON, RETICCTPCT in the last 72 hours. Urine analysis:    Component Value Date/Time   COLORURINE AMBER (A) 01/19/2021 1622   APPEARANCEUR CLOUDY (A) 01/19/2021 1622   LABSPEC 1.018 01/19/2021 1622   PHURINE 5.0 01/19/2021 1622   GLUCOSEU NEGATIVE  01/19/2021 1622   HGBUR MODERATE (A) 01/19/2021 1622   BILIRUBINUR NEGATIVE 01/19/2021 1622   KETONESUR NEGATIVE 01/19/2021 1622   PROTEINUR 100 (A) 01/19/2021 1622   NITRITE NEGATIVE 01/19/2021 1622   LEUKOCYTESUR NEGATIVE 01/19/2021 1622   Sepsis Labs: @LABRCNTIP (procalcitonin:4,lacticidven:4)  )No results found for this or any previous visit (from the past 240 hour(s)).    Studies: DG Chest Port 1 View  Result Date: 01/24/2021 CLINICAL DATA:  Aspiration pneumonia. EXAM: PORTABLE CHEST 1 VIEW COMPARISON:  01/20/2021. FINDINGS: NG tube and left IJ line in stable position. Mediastinal prominence, this may be related to AP portable technique. Follow-up PA and lateral chest x-ray suggested. Stable cardiomegaly. Low lung volumes. Low lung volumes with left base atelectasis/infiltrate again noted. Mild left perihilar infiltrate/edema. Small left pleural effusion can not be excluded. No pneumothorax. IMPRESSION: 1.  NG tube left IJ line stable position. 2. Mediastinal prominence, this may be related to AP portable technique. Follow-up PA and lateral chest x-ray suggested. 3.  Stable cardiomegaly. 4. Low lung volumes with left base atelectasis/infiltrate again noted. Mild left perihilar infiltrates/edema. Small left pleural effusion cannot be excluded. Electronically Signed   By: Marcello Moores  Register   On: 01/24/2021 05:52   DG Abd 2 Views  Result Date: 01/24/2021 CLINICAL DATA:  Ileus EXAM: ABDOMEN - 2 VIEW COMPARISON:  01/21/2021 FINDINGS: Nasogastric tube is seen looped within the gastric lumen with its tip in the expected gastric antrum. Multiple gas-filled dilated loops of small bowel are again identified throughout the  abdomen with a relative paucity of gas within the colon in keeping with a distal small bowel obstruction. No free intraperitoneal gas. Inferior pelvis excluded from view. No organomegaly. IMPRESSION: Nasogastric tube tip within the a gastric antrum. Persistent small bowel obstruction. Electronically Signed   By: Fidela Salisbury MD   On: 01/24/2021 05:52   DG Abd Portable 1V-Small Bowel Obstruction Protocol-initial, 8 hr delay  Result Date: 01/24/2021 CLINICAL DATA:  Small bowel obstruction. EXAM: PORTABLE ABDOMEN - 1 VIEW COMPARISON:  01/24/2021, earlier same day FINDINGS: Diffuse gaseous bowel distension persists without substantial interval change. NG tube again noted in the stomach. IMPRESSION: No substantial interval change. Electronically Signed   By: Misty Stanley M.D.   On: 01/24/2021 14:00    Scheduled Meds: . allopurinol  100 mg Per Tube QHS  . Chlorhexidine Gluconate Cloth  6 each Topical Daily  . hydrocortisone sod succinate (SOLU-CORTEF) inj  100 mg Intravenous Q12H  . insulin aspart  0-15 Units Subcutaneous Q4H  . insulin glargine  8 Units Subcutaneous Daily  . mouth rinse  15 mL Mouth Rinse BID  . metoCLOPramide (REGLAN) injection  10 mg Intravenous Q6H  . pantoprazole (PROTONIX) IV  40 mg Intravenous Daily  . polyethylene glycol  17 g Per Tube BID    Continuous Infusions: . sodium chloride    . amiodarone 30 mg/hr (01/24/21 1200)  . cefTRIAXone (ROCEPHIN)  IV Stopped (01/23/21 1553)  . heparin 1,950 Units/hr (01/24/21 1123)  . lactated ringers 20 mL/hr at 01/24/21 1200  . TPN ADULT (ION) 40 mL/hr at 01/24/21 1200  . TPN ADULT (ION)       LOS: 8 days     Kayleen Memos, MD [facility] Pager [phone]  If 7PM-7AM, please contact night-coverage [email or web address] Password [facility] 01/24/2021, 4:02 PM

## 2021-01-24 NOTE — Progress Notes (Signed)
[facility] Progress Note Patient Name: [name] DOB: [birth year] MRN: [id number]   Date of Service  01/24/2021  HPI/Events of Note  K+ 3.0, creatinine 1.6.  eICU Interventions  K+ replaced per modified E-Link adult K+ replacement protocol.        Kerry Kass Joory Gough 01/24/2021, 5:50 AM

## 2021-01-24 NOTE — Progress Notes (Signed)
[place] for Heparin Indication: AFib  Allergies  Allergen Reactions  . Penicillins Hives and Other (See Comments)    "cillins" Has patient had a PCN reaction causing immediate rash, facial/tongue/throat swelling, SOB or lightheadedness with hypotension: unknown Has patient had a PCN reaction causing severe rash involving mucus membranes or skin necrosis: unknown Has patient had a PCN reaction that required hospitalization no Has patient had a PCN reaction occurring within the last 10 years:no If all of the above answers are "NO", then may proceed with Cephalosporin use.     Patient Measurements: Height: 5\' 8"  (172.7 cm) Weight: 109.1 kg (240 lb 8.4 oz) IBW/kg (Calculated) : 68.4 Heparin Dosing Weight: 91 kg  Vital Signs: Temp: 97.9 F (36.6 C) (05/19 0800) Temp Source: Oral (05/19 0800) BP: 163/62 (05/19 0700) Pulse Rate: 88 (05/19 0700)  Labs: Recent Labs    01/22/21 0330 01/23/21 0454 01/23/21 0730 01/23/21 1410 01/24/21 0338  HGB 9.7* 10.0*  --   --  10.5*  HCT 28.5* 29.9*  --   --  31.5*  PLT 138* 156  --   --  176  HEPARINUNFRC 0.36 0.25*  --  0.32 0.30  CREATININE 2.85*  --  1.95* 1.86* 1.63*    Estimated Creatinine Clearance: 46.9 mL/min (A) (by C-G formula based on SCr of 1.63 mg/dL (H)).  Medications:   No oral anticoagulation PTA  Heparin 5000 units sq q8h for VTE prophylaxis started 01/20/2021 (last dose charted @ 23:00 on 5/15)  Assessment: 1 yoM admitted for L TKA, done 5/11. Postop course complicated by ileus and respiratory failure from likely aspiration. Additionally, on 5/15, noted to be in [place] and with elevated D-dimer. Pharmacy consulted to dose IV heparin for Afib and r/o VTE. VQ scan and LE Doppler negative for clot.  01/24/2021   HL therapeutic but borderline low on 1900 units/hr  Hgb low but stable; Plt improved to WNL  Per RN no bleeding or infusion issues  Goal of Therapy:  Heparin  level 0.3-0.7 units/ml Monitor platelets by anticoagulation protocol: Yes   Plan:   Increase heparin slightly to 1950 units/hr - f/u plans for continued anticoagulation  Monitor for signs and symptoms of bleeding  Daily CBC and heparin level  Reuel Boom, PharmD, BCPS [phone] 01/24/2021, 8:21 AM

## 2021-01-24 NOTE — Progress Notes (Signed)
Subjective: 8 Days Post-Op Procedure(s) (LRB): TOTAL KNEE ARTHROPLASTY (Left) Patient reports pain as 2 on 0-10 scale.   Denies CP or SOB.  Voiding without difficulty. Positive flatus. Objective: Vital signs in last 24 hours: Temp:  [97.7 F (36.5 C)-98.1 F (36.7 C)] 97.9 F (36.6 C) (05/19 0800) Pulse Rate:  [88-103] 88 (05/19 0700) Resp:  [16-25] 19 (05/19 0700) BP: (147-174)/(52-74) 163/62 (05/19 0700) SpO2:  [92 %-98 %] 94 % (05/19 0700) Weight:  [109.1 kg] 109.1 kg (05/18 1122)  Intake/Output from previous day: 05/18 0701 - 05/19 0700 In: 3812.4 [I.V.:3542.4; NG/GT:170; IV Piggyback:100] Out: 3200 [Urine:3050; Emesis/NG output:150] Intake/Output this shift: No intake/output data recorded.  Recent Labs    01/22/21 0330 01/23/21 0454 01/24/21 0338  HGB 9.7* 10.0* 10.5*   Recent Labs    01/23/21 0454 01/24/21 0338  WBC 8.5 13.7*  RBC 3.19* 3.28*  HCT 29.9* 31.5*  PLT 156 176   Recent Labs    01/23/21 1410 01/24/21 0338  NA 139 138  K 3.5 3.0*  CL 99 102  CO2 26 29  BUN 66* 62*  CREATININE 1.86* 1.63*  GLUCOSE 125* 160*  CALCIUM 8.9 8.6*   No results for input(s): LABPT, INR in the last 72 hours.  Neurologically intact Sensation intact distally Intact pulses distally Dorsiflexion/Plantar flexion intact Incision: dressing C/D/I  No DVT Abd still distended.  Assessment/Plan:  8 Days Post-Op Procedure(s) (LRB): TOTAL KNEE ARTHROPLASTY (Left) Up with therapy Appreciate ICU team Knee doing fine Continue GI augmentation  Active Problems:   S/P TKR (total knee replacement) using cement   Acute respiratory distress   AKI (acute kidney injury) ([place])   Acute renal failure (HCC)   Constipation   Benign prostatic hyperplasia   OSA (obstructive sleep apnea)   Hypotension   Type 2 diabetes mellitus without complication ([place])   Ileus ([place])      [name] 01/24/2021, @NOW 

## 2021-01-24 NOTE — Progress Notes (Signed)
Peripherally Inserted Central Catheter Placement  The IV Nurse has discussed with the patient and/or persons authorized to consent for the patient, the purpose of this procedure and the potential benefits and risks involved with this procedure.  The benefits include less needle sticks, lab draws from the catheter, and the patient may be discharged home with the catheter. Risks include, but not limited to, infection, bleeding, blood clot (thrombus formation), and puncture of an artery; nerve damage and irregular heartbeat and possibility to perform a PICC exchange if needed/ordered by physician.  Alternatives to this procedure were also discussed.  Bard Power PICC patient education guide, fact sheet on infection prevention and patient information card has been provided to patient /or left at bedside.    PICC Placement Documentation  PICC Double Lumen 40/76/80 PICC Right Basilic 43 cm 1 cm (Active)  Indication for Insertion or Continuance of Line Administration of hyperosmolar/irritating solutions (i.e. TPN, Vancomycin, etc.) 01/24/21 1651  Exposed Catheter (cm) 1 cm 01/24/21 1651  Site Assessment Clean;Dry;Intact 01/24/21 1651  Lumen #1 Status Flushed;Blood return noted 01/24/21 1651  Lumen #2 Status Flushed;Blood return noted 01/24/21 1651  Dressing Type Transparent 01/24/21 1651  Dressing Status Clean;Dry;Intact 01/24/21 1651  Antimicrobial disc in place? Yes 01/24/21 1651  Dressing Intervention New dressing;Other (Comment) 01/24/21 1651  Dressing Change Due 01/31/21 01/24/21 1651   Consent signed by wife at bedside    [name] 01/24/2021, 4:52 PM

## 2021-01-24 NOTE — TOC Progression Note (Signed)
Transition of Care [facility]) - Progression Note    Patient Details  Name: [name] MRN: [id number] Date of Birth: [birth year]  Transition of Care [facility] CM/SW Contact  Leeroy Cha, RN Phone Number: 01/24/2021, 7:22 AM  Clinical Narrative:    7 Days Post-Op Procedure(s) (LRB): TOTAL KNEE ARTHROPLASTY (Left) Patient reports pain as mild.  Reports his abdomen still feels full and uncomfortable, not passing gas following for progression, possible poist op ileus? Following for toc needs, hhc and dme   Expected Discharge Plan: [place] Barriers to Discharge: Barriers Resolved  Expected Discharge Plan and Services Expected Discharge Plan: [place] In-house Referral: Clinical Social Work   Post Acute Care Choice: [place] Living arrangements for the past 2 months: [place] Expected Discharge Date: 01/20/21               DME Arranged: 3-N-1,[place] rolling DME Agency: [employer] Date DME Agency Contacted: 01/19/21 Time DME Agency Contacted: [id number] Representative spoke with at DME Agency: [place]: PT [place]: [facility] [place] office) Date [place]: 01/18/21 Time [place]: 1000 Representative spoke with at [place]: [place] (ph# [phone]; fax # [phone])   Social Determinants of Health ([place]) Interventions    Readmission Risk Interventions Readmission Risk Prevention Plan 01/17/2021  Transportation Screening Complete  PCP or Specialist Appt within 5-7 Days Complete  Home Care Screening Complete  Some recent data might be hidden

## 2021-01-24 NOTE — Progress Notes (Addendum)
Patient ID: [name], male   DOB: 06/09/1946, 75 y.o.   MRN: [id number]    Progress Note   Subjective   Day # 8  CC; Ileus post op left knee replacement  Prealbumin 10 Potassium 3/magnesium 3.1/Phos 2.5  WBC 13.7/hemoglobin 10.5/hematocrit 31.5  KUB-multiple gas-filled dilated loops of small bowel with positive gas in the colon more consistent with small bowel obstruction Chest x-ray-low lung volumes with left base atelectasis/infiltrate again noted mild left perihilar infiltrate/edema   Patient somewhat pleasantly confused as he was yesterday, says lower abdomen remains tender.  He feels that he had good results with the second enema yesterday   NG about 200 out thus far today    Objective   Vital signs in last 24 hours: Temp:  [97.7 F (36.5 C)-98.1 F (36.7 C)] 97.9 F (36.6 C) (05/19 0800) Pulse Rate:  [88-103] 88 (05/19 0700) Resp:  [16-25] 19 (05/19 0700) BP: (147-174)/(52-74) 163/62 (05/19 0700) SpO2:  [92 %-98 %] 94 % (05/19 0700) Weight:  [109.1 kg] 109.1 kg (05/18 1122) Last BM Date: 01/23/21 General:    Elderly white male in NAD, NG in place Heart:  Regular rate and rhythm; no murmurs Lungs: Respirations even and unlabored, lungs CTA bilaterally Abdomen: Large, protuberant, distended, tympanitic bowel sounds decreased but present.  No focal tenderness he has some mild tenderness across the lower abdomen Extremities:  Without edema. Neurologic:  Alert and oriented,  grossly normal neurologically. Psych:  Cooperative. Normal mood and affect.  Intake/Output from previous day: 05/18 0701 - 05/19 0700 In: 3812.4 [I.V.:3542.4; NG/GT:170; IV Piggyback:100] Out: 6948 [Urine:3050; Emesis/NG output:150] Intake/Output this shift: No intake/output data recorded.  Lab Results: Recent Labs    01/22/21 0330 01/23/21 0454 01/24/21 0338  WBC 6.0 8.5 13.7*  HGB 9.7* 10.0* 10.5*  HCT 28.5* 29.9* 31.5*  PLT 138* 156 176   BMET Recent Labs    01/23/21 0730  01/23/21 1410 01/24/21 0338  NA 137 139 138  K 3.1* 3.5 3.0*  CL 98 99 102  CO2 27 26 29   GLUCOSE 121* 125* 160*  BUN 73* 66* 62*  CREATININE 1.95* 1.86* 1.63*  CALCIUM 8.7* 8.9 8.6*   LFT Recent Labs    01/24/21 0338  PROT 5.6*  ALBUMIN 2.7*  AST 17  ALT 18  ALKPHOS 42  BILITOT 1.1   DG Abd Portable 1V-Small Bowel Obstruction Protocol-initial, 8 hr delay CLINICAL DATA:  Small bowel obstruction.  EXAM: PORTABLE ABDOMEN - 1 VIEW  COMPARISON:  01/24/2021, earlier same day  FINDINGS: Diffuse gaseous bowel distension persists without substantial interval change. NG tube again noted in the stomach.  IMPRESSION: No substantial interval change.  Electronically Signed   By: Misty Stanley M.D.   On: 01/24/2021 14:00 DG Chest Port 1 View CLINICAL DATA:  Aspiration pneumonia.  EXAM: PORTABLE CHEST 1 VIEW  COMPARISON:  01/20/2021.  FINDINGS: NG tube and left IJ line in stable position. Mediastinal prominence, this may be related to AP portable technique. Follow-up PA and lateral chest x-ray suggested. Stable cardiomegaly. Low lung volumes. Low lung volumes with left base atelectasis/infiltrate again noted. Mild left perihilar infiltrate/edema. Small left pleural effusion can not be excluded. No pneumothorax.  IMPRESSION: 1.  NG tube left IJ line stable position.  2. Mediastinal prominence, this may be related to AP portable technique. Follow-up PA and lateral chest x-ray suggested.  3.  Stable cardiomegaly.  4. Low lung volumes with left base atelectasis/infiltrate again noted. Mild left perihilar infiltrates/edema. Small left  pleural effusion cannot be excluded.  Electronically Signed   By: Marcello Moores  Register   On: 01/24/2021 05:52 DG Abd 2 Views CLINICAL DATA:  Ileus  EXAM: ABDOMEN - 2 VIEW  COMPARISON:  01/21/2021  FINDINGS: Nasogastric tube is seen looped within the gastric lumen with its tip in the expected gastric antrum. Multiple gas-filled  dilated loops of small bowel are again identified throughout the abdomen with a relative paucity of gas within the colon in keeping with a distal small bowel obstruction. No free intraperitoneal gas. Inferior pelvis excluded from view. No organomegaly.  IMPRESSION: Nasogastric tube tip within the a gastric antrum.  Persistent small bowel obstruction.  Electronically Signed   By: Fidela Salisbury MD   On: 01/24/2021 05:52        Assessment / Plan:     #41 75 year old male with postop ileus versus partial small bowel obstruction. Higher suspicion for persistent ileus as symptoms occurred postoperatively in setting of complicated left knee replacement 1 week ago.  Plain films today more suggestive of distal small bowel obstruction though he did have some deep compression of his colon yesterday after enemas which may account for changes on imaging.  #2 hypokalemia-persisting despite correction yesterday #3 adult onset diabetes mellitus 4.  Hypertension 5.  Coronary artery disease 6.  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  I think this is still acting more like an ileus.  In any rate management is the same for now.  We will continue to follow.  Gatha Mayer, MD, [facility] 01/24/2021 4:13  PM

## 2021-01-24 NOTE — Progress Notes (Signed)
Physical Therapy Treatment Patient Details Name: [name] MRN: [id number] DOB: 24-Feb-1946 Today's Date: 01/24/2021    History of Present Illness Pt is a 75 year old male s/p left TKA on 01/16/21. Transferred to ICU on 5/15 due to respiratory failure, ileus, kidney failure    PT Comments    Patient making steady progress with acute therapy and ambulated 2 short bouts of ~14' and 10' with RW and min assist today. He continues to require +2 Mod assist for bed mobility and transfers and close chair follow for safety with gait. Repeated verbal cues required for sequencing all tasks and for safe positioning of RW during gait. EOS pt fatigued and c/o Lt knee pain. Acute PT will continue to progress as able and follow up for gait training and exercises to facilitate ROM and strengthening.    Follow Up Recommendations  Follow surgeon's recommendation for DC plan and follow-up therapies;SNF (per pt, SNF upon d/c)     Equipment Recommendations  Rolling walker with 5" wheels;Wheelchair (measurements PT)    Recommendations for Other Services       Precautions / Restrictions Precautions Precautions: Fall;Knee Precaution Comments: NG tube, monitor HR/BP Required Braces or Orthoses: Knee Immobilizer - Right Knee Immobilizer - Left: On when out of bed or walking Restrictions Weight Bearing Restrictions: No LLE Weight Bearing: Weight bearing as tolerated    Mobility  Bed Mobility Overal bed mobility: Needs Assistance Bed Mobility: Supine to Sit     Supine to sit: Mod assist;+2 for physical assistance;HOB elevated;+2 for safety/equipment     General bed mobility comments: Mod +2 assist for Bil LE's to pivot to EOB, pt did initiate walking LE's towards EOB today. cues for use of bed rail and assist to raise trunk needed.    Transfers Overall transfer level: Needs assistance Equipment used: Rolling walker (2 wheeled) Transfers: Sit to/from Stand Sit to Stand: Mod assist;+2  safety/equipment;+2 physical assistance;From elevated surface         General transfer comment: Mod+2 assist for power up from elevated EOB and recliner. Patient required repeated ceus for safe hand placement with power up.  Ambulation/Gait Ambulation/Gait assistance: Min assist;+2 safety/equipment Gait Distance (Feet): 24 Feet (14,10) Assistive device: Rolling walker (2 wheeled) Gait Pattern/deviations: Step-to pattern;Decreased stance time - left;Trunk flexed;Decreased weight shift to left;Decreased stride length Gait velocity: decr   General Gait Details: pt ambulated short bout in room wtih close chair follow for safety. Min +2 assist for safety, cues needed for walker positioning as pt has tendency to move walker too far ahead very quickly. HR max of 124 bpm with activitey and seated rest after ~14' and pt ambulated additional 10' to hallway.   Stairs             Wheelchair Mobility    Modified Rankin (Stroke Patients Only)       Balance Overall balance assessment: Needs assistance Sitting-balance support: Feet supported;Single extremity supported Sitting balance-Leahy Scale: Fair Sitting balance - Comments: UE support   Standing balance support: Bilateral upper extremity supported;During functional activity Standing balance-Leahy Scale: Poor Standing balance comment: reliant on support of RW and therapists                            Cognition Arousal/Alertness: Awake/alert Behavior During Therapy: WFL for tasks assessed/performed Overall Cognitive Status: Within Functional Limits for tasks assessed  General Comments: has a tendency to keep eyes closed but verbally responsive an opens eyes when directed. pt oriented and follows commands.      Exercises Other Exercises Other Exercises: Overhead arm raises x 10 bilaterally    General Comments        Pertinent Vitals/Pain Pain Assessment:  Faces Faces Pain Scale: Hurts even more Pain Location: left knee (after ambulation) Pain Descriptors / Indicators: Aching;Sore;Grimacing Pain Intervention(s): Limited activity within patient's tolerance;Monitored during session;Repositioned    Home Living                      Prior Function            PT Goals (current goals can now be found in the care plan section) Acute Rehab PT Goals Patient Stated Goal: to go home to wife PT Goal Formulation: With patient Time For Goal Achievement: 02/05/21 Potential to Achieve Goals: Good Progress towards PT goals: Progressing toward goals    Frequency    7X/week      PT Plan Current plan remains appropriate    Co-evaluation PT/OT/SLP Co-Evaluation/Treatment: Yes Reason for Co-Treatment: For patient/therapist safety;To address functional/ADL transfers PT goals addressed during session: Mobility/safety with mobility;Balance;Proper use of DME OT goals addressed during session: ADL's and self-care;Proper use of Adaptive equipment and DME;Strengthening/ROM      AM-PAC PT "6 Clicks" Mobility   Outcome Measure  Help needed turning from your back to your side while in a flat bed without using bedrails?: A Lot Help needed moving from lying on your back to sitting on the side of a flat bed without using bedrails?: A Lot Help needed moving to and from a bed to a chair (including a wheelchair)?: Total Help needed standing up from a chair using your arms (e.g., wheelchair or bedside chair)?: A Lot Help needed to walk in hospital room?: Total Help needed climbing 3-5 steps with a railing? : Total 6 Click Score: 9    End of Session Equipment Utilized During Treatment: Gait belt;Left knee immobilizer Activity Tolerance: Patient tolerated treatment well;Patient limited by fatigue Patient left: in chair;with call bell/phone within reach;with chair alarm set Nurse Communication: Mobility status PT Visit Diagnosis: Other  abnormalities of gait and mobility (R26.89)     Time: 8338-2505 PT Time Calculation (min) (ACUTE ONLY): 35 min  Charges:  $Gait Training: 8-22 mins                     Verner Mould, DPT Acute Rehabilitation Services Office [phone] Pager [place] 01/24/2021, 4:43 PM

## 2021-01-24 NOTE — Progress Notes (Signed)
Physical Therapy Treatment Patient Details Name: [name] MRN: [id number] DOB: [birth year] Today's Date: 01/24/2021    History of Present Illness Pt is a 75 year old male s/p left TKA on 01/16/21. Transferred to ICU on 5/15 due to respiratory failure, ileus, kidney failure.    PT Comments    Session focused on ROM and strengthening exercises for Lt knee. Immobilizer removed for exercises and educated pt/family to keep brace off if resting in bed. Pt completed exercises with min assist for AAROM on heel slides and tactile cues for form on all exercises. Noted moderate pitting edema in bil LE and some times spent on retrograde massage to reduce edema. Educated pt and family on proper technique for edema massage and to complete distal to proximal on bil LE's. RN notified and ted hose requested to reduce edema. Acute PT will continue to progress patient as able with functional mobility and exercises to improve independence.    Follow Up Recommendations  Follow surgeon's recommendation for DC plan and follow-up therapies;SNF (per pt, SNF upon d/c)     Equipment Recommendations  Rolling walker with 5" wheels;Wheelchair (measurements PT)    Recommendations for Other Services       Precautions / Restrictions Precautions Precautions: Fall;Knee Precaution Comments: NG tube, monitor HR/BP Required Braces or Orthoses: Knee Immobilizer - Right Knee Immobilizer - Left: On when out of bed or walking Restrictions Weight Bearing Restrictions: No LLE Weight Bearing: Weight bearing as tolerated          Cognition Arousal/Alertness: Awake/alert Behavior During Therapy: WFL for tasks assessed/performed Overall Cognitive Status: Within Functional Limits for tasks assessed                                 General Comments: has a tendency to keep eyes closed but verbally responsive an opens eyes when directed. pt oriented and follows commands.      Exercises Total Joint  Exercises Ankle Circles/Pumps: AROM;Both;20 reps;Supine Short Arc Quad: AROM;Left;20 reps;Supine (2x10) Heel Slides: AAROM;Left;10 reps;Supine Hip ABduction/ADduction: AROM;Left;10 reps;Supine Other Exercises Other Exercises: Retrograde massage to Lt foot and lower leg to address edema. Also to Rt foot as pt has bil pitting edema. Educated pt's wife on proper technique for massage.    General Comments        Pertinent Vitals/Pain Pain Assessment: Faces Faces Pain Scale: Hurts little more Pain Location: left knee (after ambulation) Pain Descriptors / Indicators: Aching;Sore;Grimacing Pain Intervention(s): Limited activity within patient's tolerance;Monitored during session;Repositioned;Ice applied           PT Goals (current goals can now be found in the care plan section) Acute Rehab PT Goals Patient Stated Goal: to go home to wife PT Goal Formulation: With patient Time For Goal Achievement: 02/05/21 Potential to Achieve Goals: Good Progress towards PT goals: Progressing toward goals    Frequency    7X/week      PT Plan Current plan remains appropriate    Co-evaluation PT/OT/SLP Co-Evaluation/Treatment: Yes Reason for Co-Treatment: For patient/therapist safety;To address functional/ADL transfers PT goals addressed during session: Mobility/safety with mobility;Balance;Proper use of DME OT goals addressed during session: ADL's and self-care;Proper use of Adaptive equipment and DME;Strengthening/ROM      AM-PAC PT "6 Clicks" Mobility   Outcome Measure  Help needed turning from your back to your side while in a flat bed without using bedrails?: A Lot Help needed moving from lying on your back to sitting on  the side of a flat bed without using bedrails?: A Lot Help needed moving to and from a bed to a chair (including a wheelchair)?: Total Help needed standing up from a chair using your arms (e.g., wheelchair or bedside chair)?: A Lot Help needed to walk in hospital  room?: Total Help needed climbing 3-5 steps with a railing? : Total 6 Click Score: 9    End of Session Equipment Utilized During Treatment: Gait belt;Left knee immobilizer Activity Tolerance: Patient tolerated treatment well;Patient limited by fatigue Patient left: with call bell/phone within reach;in bed;with nursing/sitter in room;with family/visitor present Nurse Communication: Mobility status (requesting ted hose) PT Visit Diagnosis: Other abnormalities of gait and mobility (R26.89)     Time: 5436-0677 PT Time Calculation (min) (ACUTE ONLY): 21 min  Charges:  $Gait Training: 8-22 mins $Therapeutic Exercise: 8-22 mins                     Verner Mould, DPT Acute Rehabilitation Services Office [phone] Pager [phone]     Jacques Navy 01/24/2021, 6:41 PM

## 2021-01-24 NOTE — Progress Notes (Signed)
Pt still has NG tube in and unable to tolerate CPAP.

## 2021-01-25 ENCOUNTER — Inpatient Hospital Stay (HOSPITAL_COMMUNITY): Payer: Medicare Other

## 2021-01-25 DIAGNOSIS — N179 Acute kidney failure, unspecified: Secondary | ICD-10-CM | POA: Diagnosis not present

## 2021-01-25 DIAGNOSIS — K567 Ileus, unspecified: Secondary | ICD-10-CM | POA: Diagnosis not present

## 2021-01-25 LAB — CBC
HCT: 28.8 % — ABNORMAL LOW (ref 39.0–52.0)
Hemoglobin: 9.4 g/dL — ABNORMAL LOW (ref 13.0–17.0)
MCH: 31.8 pg (ref 26.0–34.0)
MCHC: 32.6 g/dL (ref 30.0–36.0)
MCV: 97.3 fL (ref 80.0–100.0)
Platelets: 189 10*3/uL (ref 150–400)
RBC: 2.96 MIL/uL — ABNORMAL LOW (ref 4.22–5.81)
RDW: 15.3 % (ref 11.5–15.5)
WBC: 18 10*3/uL — ABNORMAL HIGH (ref 4.0–10.5)
nRBC: 0.7 % — ABNORMAL HIGH (ref 0.0–0.2)

## 2021-01-25 LAB — OCCULT BLOOD GASTRIC / DUODENUM (SPECIMEN CUP)
Occult Blood, Gastric: POSITIVE — AB
pH, Gastric: 5

## 2021-01-25 LAB — BASIC METABOLIC PANEL
Anion gap: 6 (ref 5–15)
BUN: 65 mg/dL — ABNORMAL HIGH (ref 8–23)
CO2: 30 mmol/L (ref 22–32)
Calcium: 8.5 mg/dL — ABNORMAL LOW (ref 8.9–10.3)
Chloride: 108 mmol/L (ref 98–111)
Creatinine, Ser: 1.3 mg/dL — ABNORMAL HIGH (ref 0.61–1.24)
GFR, Estimated: 57 mL/min — ABNORMAL LOW (ref >60)
Glucose, Bld: 208 mg/dL — ABNORMAL HIGH (ref 70–99)
Potassium: 3.4 mmol/L — ABNORMAL LOW (ref 3.5–5.1)
Sodium: 144 mmol/L (ref 135–145)

## 2021-01-25 LAB — GLUCOSE, CAPILLARY
Glucose-Capillary: 176 mg/dL — ABNORMAL HIGH (ref 70–99)
Glucose-Capillary: 186 mg/dL — ABNORMAL HIGH (ref 70–99)
Glucose-Capillary: 191 mg/dL — ABNORMAL HIGH (ref 70–99)
Glucose-Capillary: 201 mg/dL — ABNORMAL HIGH (ref 70–99)
Glucose-Capillary: 210 mg/dL — ABNORMAL HIGH (ref 70–99)
Glucose-Capillary: 212 mg/dL — ABNORMAL HIGH (ref 70–99)
Glucose-Capillary: 217 mg/dL — ABNORMAL HIGH (ref 70–99)

## 2021-01-25 LAB — HEMOGLOBIN AND HEMATOCRIT, BLOOD
HCT: 25.7 % — ABNORMAL LOW (ref 39.0–52.0)
HCT: 25.1 % — ABNORMAL LOW (ref 39.0–52.0)
Hemoglobin: 8.5 g/dL — ABNORMAL LOW (ref 13.0–17.0)
Hemoglobin: 8.2 g/dL — ABNORMAL LOW (ref 13.0–17.0)

## 2021-01-25 LAB — PHOSPHORUS: Phosphorus: 1.9 mg/dL — ABNORMAL LOW (ref 2.5–4.6)

## 2021-01-25 LAB — HEPARIN LEVEL (UNFRACTIONATED): Heparin Unfractionated: 0.27 IU/mL — ABNORMAL LOW (ref 0.30–0.70)

## 2021-01-25 LAB — MAGNESIUM: Magnesium: 2.6 mg/dL — ABNORMAL HIGH (ref 1.7–2.4)

## 2021-01-25 LAB — PROCALCITONIN: Procalcitonin: 0.87 ng/mL

## 2021-01-25 MED ORDER — ACETAMINOPHEN 160 MG/5ML PO SOLN
325.0000 mg | Freq: Four times a day (QID) | ORAL | Status: DC | PRN
  Administered 2021-01-25: 650 mg
  Filled 2021-01-25: qty 20.3

## 2021-01-25 MED ORDER — PANTOPRAZOLE SODIUM 40 MG IV SOLR
40.0000 mg | Freq: Two times a day (BID) | INTRAVENOUS | Status: DC
  Administered 2021-01-25 – 2021-01-26 (×2): 40 mg via INTRAVENOUS
  Filled 2021-01-25 (×2): qty 40

## 2021-01-25 MED ORDER — HYDROCORTISONE NA SUCCINATE PF 100 MG IJ SOLR: 50.0000 mg | Freq: Two times a day (BID) | INTRAMUSCULAR | Status: DC

## 2021-01-25 MED ORDER — SODIUM CHLORIDE 0.9 % IV SOLN
30.0000 mmol | Freq: Once | INTRAVENOUS | Status: AC
  Administered 2021-01-25: 30 mmol via INTRAVENOUS
  Filled 2021-01-25: qty 10

## 2021-01-25 MED ORDER — HEPARIN (PORCINE) 25000 UT/250ML-% IV SOLN
2150.0000 [IU]/h | INTRAVENOUS | Status: DC
  Administered 2021-01-25 – 2021-01-26 (×2): 2050 [IU]/h via INTRAVENOUS
  Filled 2021-01-25: qty 250

## 2021-01-25 MED ORDER — TRAVASOL 10 % IV SOLN
INTRAVENOUS | Status: AC
  Filled 2021-01-25: qty 1056

## 2021-01-25 MED ORDER — LIP MEDEX EX OINT
TOPICAL_OINTMENT | Freq: Once | CUTANEOUS | Status: AC
  Administered 2021-01-25: 1 via TOPICAL
  Filled 2021-01-25: qty 7

## 2021-01-25 MED ORDER — METOPROLOL TARTRATE 5 MG/5ML IV SOLN
2.5000 mg | Freq: Three times a day (TID) | INTRAVENOUS | Status: DC
  Administered 2021-01-25 – 2021-01-26 (×3): 2.5 mg via INTRAVENOUS
  Filled 2021-01-25 (×3): qty 5

## 2021-01-25 MED ORDER — MELATONIN 5 MG PO TABS
5.0000 mg | ORAL_TABLET | Freq: Every evening | ORAL | Status: DC | PRN
  Administered 2021-01-26: 5 mg via ORAL
  Filled 2021-01-25: qty 1

## 2021-01-25 NOTE — Progress Notes (Addendum)
Patient ID: [name], male   DOB: December 09, 1945, 75 y.o.   MRN: [id number]    Progress Note   Subjective   Day # 9  CC; Ileus post op Left Knee  KUB pending  Potassium 3.4/creatinine 1.3 WBC 18,000/hemoglobin 9.4/hematocrit 28 point  Procalcitonin 0.87 normal  NG/750 out during night-NG drainage very dark/blood-tinged today  Heparin off since about 830  Abdominal films pending  Patient says his lower abdomen is still tender is not complaining of any severe pain.  He did have results from soapsuds enema yesterday afternoon He says he would like to get out of bed and sit on the bedside commode as he feels urgent able to have bowel movements.     Objective   Vital signs in last 24 hours: Temp:  [97.7 F (36.5 C)-98 F (36.7 C)] 97.7 F (36.5 C) (05/20 0800) Pulse Rate:  [90-104] 95 (05/20 0600) Resp:  [17-28] 21 (05/20 0600) BP: (158-186)/(61-85) 182/72 (05/20 0600) SpO2:  [92 %-97 %] 96 % (05/20 0600) Last BM Date: 01/23/21 General: Elderly white male in NAD Heart:  Regular rate and rhythm; no murmurs Lungs: Respirations even and unlabored, lungs CTA bilaterally Abdomen: Remains distended, protuberant, perhaps a bit softer, no focal tenderness, bowel sounds quiet, tympanitic Extremities:  Without edema. Neurologic:  Alert and oriented,  grossly normal neurologically. Psych:  Cooperative. Normal mood and affect.  Intake/Output from previous day: 05/19 0701 - 05/20 0700 In: 3574.7 [I.V.:3257.9; IV Piggyback:316.8] Out: 1400 [Urine:650; Emesis/NG output:750] Intake/Output this shift: No intake/output data recorded.  Lab Results: Recent Labs    01/23/21 0454 01/24/21 0338 01/25/21 0525  WBC 8.5 13.7* 18.0*  HGB 10.0* 10.5* 9.4*  HCT 29.9* 31.5* 28.8*  PLT 156 176 189   BMET Recent Labs    01/23/21 1410 01/24/21 0338 01/25/21 0525  NA 139 138 144  K 3.5 3.0* 3.4*  CL 99 102 108  CO2 26 29 30   GLUCOSE 125* 160* 208*  BUN 66* 62* 65*  CREATININE  1.86* 1.63* 1.30*  CALCIUM 8.9 8.6* 8.5*   LFT Recent Labs    01/24/21 0338  PROT 5.6*  ALBUMIN 2.7*  AST 17  ALT 18  ALKPHOS 42  BILITOT 1.1        Assessment / Plan:    #29 75 year old white male with persistent ileus versus partial small bowel obstruction onset postop left knee replacement 8 days ago  NG in place, having some results with enemas but has not opened up as yet  Small bowel protocol initiated-today's films pending  Continue IV Reglan Twice daily MiraLAX  NG output quite dark, suspect oozing from stomach in setting of heparin   #2 malnutrition-multifactorial postop TPN has been started  #3 status post left knee replacement 1 week ago-on IV heparin currently on hold this morning  #4 adult onset diabetes mellitus #5 acute hypoxic respiratory failure secondary to atelectasis versus infiltrates On Rocephin Procalcitonin within normal limits WBC on the rise, up to 18,000 today  #6 atrial fibrillation #7 chronic kidney disease  Plan; continue NG Follow-up today's abdominal films, repeat films in a.m. Serial hemoglobins Continue IV PPI  Okay to resume heparin from GI perspective and monitor closely for any increase in bleeding    LOS: 9 days   Amy EsterwoodPA-C  01/25/2021, 8:54 AM    [facility] Attending   I have taken an interval history, reviewed the chart and examined the patient. I agree with the Advanced Practitioner's note, impression and recommendations.  I think the dark fluid could be from small bowel and stasis related to ileus vs SBO as opposed to blood. Often really difficult to tell and can be either or both.  At any rate about the same though to my eyes the small bowel distention looks a bit less on xray.  I hope things open up as he is not a good candidate for laparotomy  Gatha Mayer, MD, [facility] 01/25/2021 5:49 PM

## 2021-01-25 NOTE — Progress Notes (Signed)
Subjective: 9 Days Post-Op Procedure(s) (LRB): TOTAL KNEE ARTHROPLASTY (Left) Patient reports pain as mild.   Reports belly is feeling more comfortable  Objective: Vital signs in last 24 hours: Temp:  [97.7 F (36.5 C)-98 F (36.7 C)] 98 F (36.7 C) (05/20 0400) Pulse Rate:  [90-104] 95 (05/20 0600) Resp:  [17-28] 21 (05/20 0600) BP: (158-186)/(61-85) 182/72 (05/20 0600) SpO2:  [92 %-97 %] 96 % (05/20 0600)  Intake/Output from previous day: 05/19 0701 - 05/20 0700 In: 3574.7 [I.V.:3257.9; IV Piggyback:316.8] Out: 1400 [Urine:650; Emesis/NG output:750] Intake/Output this shift: No intake/output data recorded.  Recent Labs    01/23/21 0454 01/24/21 0338 01/25/21 0525  HGB 10.0* 10.5* 9.4*   Recent Labs    01/24/21 0338 01/25/21 0525  WBC 13.7* 18.0*  RBC 3.28* 2.96*  HCT 31.5* 28.8*  PLT 176 189   Recent Labs    01/24/21 0338 01/25/21 0525  NA 138 144  K 3.0* 3.4*  CL 102 108  CO2 29 30  BUN 62* 65*  CREATININE 1.63* 1.30*  GLUCOSE 160* 208*  CALCIUM 8.6* 8.5*   No results for input(s): LABPT, INR in the last 72 hours.  Neurologically intact ABD soft Neurovascular intact Sensation intact distally Incision clean and dry  Assessment/Plan: 9 Days Post-Op Procedure(s) (LRB): TOTAL KNEE ARTHROPLASTY (Left) Ileus per hospitalist/critical care team  Cecilie Kicks 01/25/2021, 7:53 AM

## 2021-01-25 NOTE — Progress Notes (Addendum)
[facility] for Heparin Indication: AFib  Allergies  Allergen Reactions  . Penicillins Hives and Other (See Comments)    "cillins" Has patient had a PCN reaction causing immediate rash, facial/tongue/throat swelling, SOB or lightheadedness with hypotension: unknown Has patient had a PCN reaction causing severe rash involving mucus membranes or skin necrosis: unknown Has patient had a PCN reaction that required hospitalization no Has patient had a PCN reaction occurring within the last 10 years:no If all of the above answers are "NO", then may proceed with Cephalosporin use.     Patient Measurements: Height: 5\' 8"  (172.7 cm) Weight: 109.1 kg (240 lb 8.4 oz) IBW/kg (Calculated) : 68.4 Heparin Dosing Weight: 91 kg  Vital Signs: Temp: 98 F (36.7 C) (05/20 0400) Temp Source: Axillary (05/20 0400) BP: 177/71 (05/20 0400) Pulse Rate: 91 (05/20 0400)  Labs: Recent Labs    01/23/21 0454 01/23/21 0730 01/23/21 1410 01/24/21 0338 01/25/21 0525  HGB 10.0*  --   --  10.5* 9.4*  HCT 29.9*  --   --  31.5* 28.8*  PLT 156  --   --  176 189  HEPARINUNFRC 0.25*  --  0.32 0.30 0.27*  CREATININE  --    < > 1.86* 1.63* 1.30*   < > = values in this interval not displayed.    Estimated Creatinine Clearance: 58.8 mL/min (A) (by C-G formula based on SCr of 1.3 mg/dL (H)).  Medications:   No oral anticoagulation PTA  Heparin 5000 units sq q8h for VTE prophylaxis started 01/20/2021 (last dose charted @ 23:00 on 5/15)  Assessment: 4 yoM admitted for L TKA, done 5/11. Postop course complicated by ileus and respiratory failure from likely aspiration. Additionally, on 5/15, noted to be in [place] and with elevated D-dimer. Pharmacy consulted to dose IV heparin for Afib and r/o VTE. VQ scan and LE Doppler negative for clot.  01/25/2021   HL subtherapeutic on 1950 units/hr  Hgb low but stable; Plt improved to WNL   no bleeding or infusion issues  reported  Goal of Therapy:  Heparin level 0.3-0.7 units/ml Monitor platelets by anticoagulation protocol: Yes   Plan:   Increase heparin to 2050 units/hr units/hr - f/u plans for continued anticoagulation  Heparin level in 8 hours  Monitor for signs and symptoms of bleeding  Daily CBC and heparin level  Dolly Rias RPh 01/25/2021, 6:10 AM    Addendum: heparin drip stopped this morning for bleeding from NGT.  Pt seen by GI.  Per GI note "Okay to resume heparin from GI perspective and monitor closely for any increase in bleeding"  Plan: resume heparin drip at 2050 units/hr and check 8 hr heparin level Continue daily CBC, heparin level Monitor closely for s/sx of bleeding  Eudelia Bunch, Pharm.D 01/25/2021 11:49 AM

## 2021-01-25 NOTE — Progress Notes (Signed)
Physical Therapy Treatment Patient Details Name: [name] MRN: [id number] DOB: [birth year] Today's Date: 01/25/2021       PT Comments    Patient progressing slowly with acute therapy but remains motivated. Patient limited this date by fatigue and tachycardia with Max HR of 150 bpm during gait. He continues to require min-mod assist +2 for transfers and gait. After return to sitting HR returned to 110's-120's. Seated exercises for Lt knee ROM and strength completed, pt was able to initiate heel slide with AAROM at end range for increased flexion ROM. HE will continue to benefit from skilled PT interventions to progress mobility as able to improve independence.      01/25/21 1300  PT Visit Information  Last PT Received On 01/25/21  Assistance Needed +2  History of Present Illness Pt is a 75 year old male s/p left TKA on 01/16/21. Transferred to ICU on 5/15 due to respiratory failure, ileus, kidney failure  Subjective Data  Patient Stated Goal to go home to wife  Precautions  Precautions Fall;Knee  Precaution Comments NG tube, monitor HR/BP  Required Braces or Orthoses Knee Immobilizer - Right  Knee Immobilizer - Left On when out of bed or walking  Restrictions  Weight Bearing Restrictions No  LLE Weight Bearing WBAT  Pain Assessment  Pain Assessment Faces  Faces Pain Scale 6  Pain Location left knee (after ambulation)  Pain Descriptors / Indicators Aching;Sore;Grimacing  Pain Intervention(s) Limited activity within patient's tolerance;Monitored during session;Repositioned;Ice applied  Cognition  Arousal/Alertness Awake/alert  Behavior During Therapy WFL for tasks assessed/performed  Overall Cognitive Status Within Functional Limits for tasks assessed  General Comments has a tendency to keep eyes closed but verbally responsive an opens eyes when directed. extra time to follow commands today and pt perseverating on NGT being on suction today.  Bed Mobility  General bed mobility  comments pt OOB in recliner, just completed using BSC with RN/NT assist.  Transfers  Overall transfer level Needs assistance  Equipment used Rolling walker (2 wheeled)  Transfers Sit to/from Stand  Sit to Stand Mod assist;+2 safety/equipment;+2 physical assistance;From elevated surface  General transfer comment Mod+2 assist for power up from recliner. Patient required repeated cues for safe hand placement with power up and bil UE's needed on armrest to initiate. Assist needed to sequence reach back. performed 2x from recliner.  Ambulation/Gait  Ambulation/Gait assistance Min assist;+2 safety/equipment  Gait Distance (Feet) 10 Feet (6, 4)  Assistive device Rolling walker (2 wheeled)  Gait Pattern/deviations Step-to pattern;Decreased stance time - left;Trunk flexed;Decreased weight shift to left;Decreased stride length  General Gait Details Pt more fatigued today and HR elevated reaching 144 bpm with just 6' of gait. pt returned to sitting and HR recoverd to 110's then attempted additional gait and HRup to 149 bpm with another 4'. Patient required min+2 assist to steady and close chair follow for safety.  Gait velocity decr  Balance  Overall balance assessment Needs assistance  Sitting-balance support Feet supported;Single extremity supported  Sitting balance-Leahy Scale Fair  Standing balance support Bilateral upper extremity supported;During functional activity  Standing balance-Leahy Scale Poor  Standing balance comment reliant on support of RW and therapists  Exercises  Exercises Total Joint  Total Joint Exercises  Ankle Circles/Pumps AROM;Both;20 reps;Seated  Quad Sets AROM;Left;15 reps;Seated  Short Arc Quad AROM;Left;15 reps;Seated  Heel Slides AAROM;Left;15 reps;Seated  PT - End of Session  Equipment Utilized During Treatment Gait belt;Left knee immobilizer  Activity Tolerance Patient tolerated treatment well;Patient limited by fatigue  Patient left in chair;with call bell/phone  within reach;with chair alarm set  Nurse Communication Mobility status   PT - Assessment/Plan  PT Plan Current plan remains appropriate  PT Visit Diagnosis Other abnormalities of gait and mobility (R26.89)  PT Frequency (ACUTE ONLY) 7X/week  Follow Up Recommendations Follow surgeon's recommendation for DC plan and follow-up therapies;SNF (per pt, SNF upon d/c)  PT equipment Rolling walker with 5" wheels;Wheelchair (measurements PT)  AM-PAC PT "6 Clicks" Mobility Outcome Measure (Version 2)  Help needed turning from your back to your side while in a flat bed without using bedrails? 2  Help needed moving from lying on your back to sitting on the side of a flat bed without using bedrails? 2  Help needed moving to and from a bed to a chair (including a wheelchair)? 2  Help needed standing up from a chair using your arms (e.g., wheelchair or bedside chair)? 2  Help needed to walk in hospital room? 2  Help needed climbing 3-5 steps with a railing?  1  6 Click Score 11  Consider Recommendation of Discharge To: CIR/SNF/LTACH  PT Goal Progression  Progress towards PT goals Progressing toward goals  Acute Rehab PT Goals  PT Goal Formulation With patient  Time For Goal Achievement 02/05/21  Potential to Achieve Goals Good  PT Time Calculation  PT Start Time (ACUTE ONLY) 0955  PT Stop Time (ACUTE ONLY) 1028  PT Time Calculation (min) (ACUTE ONLY) 33 min  PT General Charges  $$ ACUTE PT VISIT 1 Visit  PT Treatments  $Gait Training 8-22 mins  $Therapeutic Exercise 8-22 mins     Verner Mould, DPT Acute Rehabilitation Services Office [phone] Pager [phone]

## 2021-01-25 NOTE — Plan of Care (Signed)
  Problem: Elimination: Goal: Will not experience complications related to bowel motility Outcome: Progressing   Problem: Safety: Goal: Ability to remain free from injury will improve Outcome: Progressing   Problem: Pain Managment: Goal: General experience of comfort will improve Outcome: Progressing   

## 2021-01-25 NOTE — Progress Notes (Addendum)
PROGRESS NOTE  [name] UXL:[id number] DOB: 1946/01/29 DOA: 01/16/2021 PCP: Jesse Neighbors, NP  HPI/Recap of past 39 hours: 75 year old man with hx of SBO presenting for TKR complicated by ileus and now respiratory failure.  Patient has been having slowly worsening respiratory status over last 24h.  Renal function also deteriorating.  As hypoxemia has worsened patient was sent to the ICU with PCCM evaluation.  TRH was consulted to assist with the medical management.  01/25/21: Patient was seen and examined at his bedside.  He is more alert and interactive this morning.  Denies any significant abdominal pain.  NG tube gastric content with concern for possible upper GI bleed.  Seen by GI.  Okay to resume heparin from GI perspective.  Assessment/Plan: Active Problems:   S/P TKR (total knee replacement) using cement   Acute respiratory distress   AKI (acute kidney injury) ([place])   Acute renal failure (HCC)   Constipation   Benign prostatic hyperplasia   OSA (obstructive sleep apnea)   Hypotension   Type 2 diabetes mellitus without complication (HCC)   Ileus (HCC)  Status post total knee replacement Management per primary  Small bowel obstruction NG tube to suction Continue IV fluid Replace electrolytes as indicated. Abdominal x-ray done on 01/25/2021 shows persistent stable small bowel dilatation. Mobilize if able.  Acute blood loss anemia with suspected upper GI bleed Send gastric content to rule out occult blood Drop in hemoglobin down to 8.5 from 10.5 Monitor H&H Transfuse as indicated Seen by GI, recommended to continue heparin drip. 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 Continue to avoid nephrotoxic agents Continue to avoid dehydration  Monitor urine output  Resolved acute hypoxic respiratory failure secondary to atelectasis versus infiltrates. No longer requiring oxygen  supplementation, 93% on room air. Completed course of Rocephin.  Permanent A. fib with RVR Start scheduled IV Lopressor 2.5 mg 3 times daily. IV heparin as CVA prevention since NPO  Uncontrolled hypertension DC Solu-Cortef, stress dose steroids on 01/25/2021 Started IV Lopressor scheduled Monitor vital signs  Electrolyte derangement on TPN Hypokalemia, repleted with TPN Hyperglycemia, dose adjustment by pharmacy Hypophosphatemia, repleted with TPN Appreciate pharmacy's assistance  Elevated troponin Peaked at 23 Is currently on heparin drip  Prediabetes with hyperglycemia likely exacerbated by IV steroids Hemoglobin A1c is 5.7 Continue current management with Lantus 8 units daily and insulin sliding scale. Is currently on IV Solu-Cortef 100 mg twice daily Continue to monitor CBGs and treat hyperglycemia as indicated  Gout/GERD Stable Resume home regimen   Code Status: Full code.  Family Communication: None at bedside.  Disposition Plan: Defer to primary team.   Thank you for allowing [place] to participate in the care of this patient.  We will continue to follow along with you.     Objective: Vitals:   01/25/21 1213 01/25/21 1300 01/25/21 1400 01/25/21 1500  BP: (!) 165/78 (!) 132/50 (!) 161/64 (!) 164/73  Pulse: 96 92 96 (!) 102  Resp: 19 (!) 22 (!) 27 (!) 21  Temp:      TempSrc:      SpO2: 97% (!) 87% 93% 93%  Weight:      Height:        Intake/Output Summary (Last 24 hours) at 01/25/2021 1551 Last data filed at 01/25/2021 0951 Gross per 24 hour  Intake 2409.26 ml  Output 1800 ml  Net 609.26 ml  Filed Weights   01/16/21 0650 01/20/21 1300 01/23/21 1122  Weight: 102.5 kg 104.4 kg 109.1 kg    Exam:  . General: 75 y.o. year-old male with appropriate nourished in no acute distress.  He is alert and oriented x3. . Cardiovascular: Irregular rate and rhythm no rubs or gallops.   Marland Kitchen Respiratory: Clear to auscultation no wheezes or rales.  Poor inspiratory  effort.  .  Abdomen: Obese distended hypoactive bowel sounds.   . Musculoskeletal: Surgical dressing on left knee. . Skin: No ulcerative lesions noted. Marland Kitchen Psychiatry: Mood is appropriate for condition and setting.   Data Reviewed: CBC: Recent Labs  Lab 01/20/21 0401 01/20/21 1220 01/20/21 1730 01/21/21 0330 01/22/21 0330 01/23/21 0454 01/24/21 0338 01/25/21 0525 01/25/21 1224  WBC 6.6 7.4   < > 6.1 6.0 8.5 13.7* 18.0*  --   NEUTROABS 4.6 5.2  --  4.6  --   --  9.2*  --   --   HGB 14.4 13.4   < > 10.8* 9.7* 10.0* 10.5* 9.4* 8.5*  HCT 42.6 39.1   < > 31.6* 28.5* 29.9* 31.5* 28.8* 25.7*  MCV 94.7 94.2   < > 94.3 95.6 93.7 96.0 97.3  --   PLT 227 223   < > 159 138* 156 176 189  --    < > = values in this interval not displayed.   Basic Metabolic Panel: Recent Labs  Lab 01/20/21 0401 01/20/21 1217 01/20/21 1220 01/20/21 2131 01/22/21 0330 01/23/21 0730 01/23/21 1410 01/24/21 0338 01/25/21 0525  NA 132*   < >  --    < > 132* 137 139 138 144  K 4.3   < >  --    < > 3.4* 3.1* 3.5 3.0* 3.4*  CL 93*   < >  --    < > 95* 98 99 102 108  CO2 26   < >  --    < > 25 27 26 29 30   GLUCOSE 208*   < >  --    < > 161* 121* 125* 160* 208*  BUN 79*   < >  --    < > 83* 73* 66* 62* 65*  CREATININE 3.24*   < >  --    < > 2.85* 1.95* 1.86* 1.63* 1.30*  CALCIUM 9.6   < >  --    < > 8.6* 8.7* 8.9 8.6* 8.5*  MG 3.6*  --  4.1*  --  4.3* 3.9*  --  3.1* 2.6*  PHOS 6.0*  --   --   --   --  3.0  --  2.5 1.9*   < > = values in this interval not displayed.   GFR: Estimated Creatinine Clearance: 58.8 mL/min (A) (by C-G formula based on SCr of 1.3 mg/dL (H)). Liver Function Tests: Recent Labs  Lab 01/20/21 0401 01/20/21 1217 01/24/21 0338  AST  --  40 17  ALT  --  36 18  ALKPHOS  --  53 42  BILITOT  --  2.0* 1.1  PROT  --  5.8* 5.6*  ALBUMIN 3.4* 2.7* 2.7*   No results for input(s): LIPASE, AMYLASE in the last 168 hours. No results for input(s): AMMONIA in the last 168  hours. Coagulation Profile: Recent Labs  Lab 01/21/21 0330  INR 1.2   Cardiac Enzymes: No results for input(s): CKTOTAL, CKMB, CKMBINDEX, TROPONINI in the last 168 hours. BNP (last 3 results) No results for input(s): PROBNP in the  last 8760 hours. HbA1C: No results for input(s): HGBA1C in the last 72 hours. CBG: Recent Labs  Lab 01/24/21 1934 01/25/21 0020 01/25/21 0342 01/25/21 0744 01/25/21 1145  GLUCAP 209* 212* 186* 217* 201*   Lipid Profile: Recent Labs    01/24/21 0338  TRIG 98   Thyroid Function Tests: No results for input(s): TSH, T4TOTAL, FREET4, T3FREE, THYROIDAB in the last 72 hours. Anemia Panel: No results for input(s): VITAMINB12, FOLATE, FERRITIN, TIBC, IRON, RETICCTPCT in the last 72 hours. Urine analysis:    Component Value Date/Time   COLORURINE AMBER (A) 01/19/2021 1622   APPEARANCEUR CLOUDY (A) 01/19/2021 1622   LABSPEC 1.018 01/19/2021 1622   PHURINE 5.0 01/19/2021 1622   GLUCOSEU NEGATIVE 01/19/2021 1622   HGBUR MODERATE (A) 01/19/2021 1622   BILIRUBINUR NEGATIVE 01/19/2021 1622   KETONESUR NEGATIVE 01/19/2021 1622   PROTEINUR 100 (A) 01/19/2021 1622   NITRITE NEGATIVE 01/19/2021 1622   LEUKOCYTESUR NEGATIVE 01/19/2021 1622   Sepsis Labs: @LABRCNTIP (procalcitonin:4,lacticidven:4)  )No results found for this or any previous visit (from the past 240 hour(s)).    Studies: DG Abd Portable 1V-Small Bowel Obstruction Protocol-24 hr delay  Result Date: 01/25/2021 CLINICAL DATA:  A 24 hour delay film for small-bowel obstruction. EXAM: PORTABLE ABDOMEN - 1 VIEW COMPARISON:  Film from the previous day. FINDINGS: Gastric catheter is again noted coiled within the stomach. Persistent small bowel dilatation is noted although colonic gas is again seen. By history contrast was administered 24 hours previous and contrast is noted within the distal small bowel predominately within the right lower quadrant. No significant colonic contrast is noted. No free  air is seen. IMPRESSION: Persistent and stable small bowel dilatation when compared with the prior exam. Previously administered contrast material lies in the distal aspect of the small bowel. Electronically Signed   By: Inez Catalina M.D.   On: 01/25/2021 15:19    Scheduled Meds: . allopurinol  100 mg Per Tube QHS  . Chlorhexidine Gluconate Cloth  6 each Topical Daily  . insulin aspart  0-15 Units Subcutaneous Q4H  . insulin glargine  8 Units Subcutaneous Daily  . mouth rinse  15 mL Mouth Rinse BID  . metoCLOPramide (REGLAN) injection  10 mg Intravenous Q6H  . metoprolol tartrate  2.5 mg Intravenous Q8H  . pantoprazole (PROTONIX) IV  40 mg Intravenous Q12H  . polyethylene glycol  17 g Per Tube BID  . sodium chloride flush  10-40 mL Intracatheter Q12H    Continuous Infusions: . sodium chloride Stopped (01/24/21 1744)  . amiodarone 30 mg/hr (01/24/21 2051)  . heparin 2,050 Units/hr (01/25/21 1156)  . potassium PHOSPHATE IVPB (in mmol) 30 mmol (01/25/21 1147)  . TPN ADULT (ION) 80 mL/hr at 01/24/21 1759  . TPN ADULT (ION)       LOS: 9 days     Kayleen Memos, MD [facility] Pager [phone]  If 7PM-7AM, please contact night-coverage [email or web address] Password [facility] 01/25/2021, 3:51 PM

## 2021-01-25 NOTE — Progress Notes (Signed)
This nurse noticed blood being suctioned out of patients NGT. Patient is alert and oriented. GI messaged. Heparin infusion on hold till further orders.

## 2021-01-25 NOTE — Progress Notes (Signed)
NG tube is still in place, CPAP will be held at this time per pt wishes.

## 2021-01-25 NOTE — Progress Notes (Signed)
PHARMACY - TOTAL PARENTERAL NUTRITION CONSULT NOTE   Indication: Prolonged ileus  Patient Measurements: Height: 5\' 8"  (172.7 cm) Weight: 109.1 kg (240 lb 8.4 oz) IBW/kg (Calculated) : 68.4 TPN AdjBW (KG): 77.4 Body mass index is 36.57 kg/m. 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 Post op course complicated by Afib, aspiration PNA and prolonged ileus. Pharmacy consulted to manage TPN for nutrition support.  Glucose / Insulin: Hx DM2 on only metformin PTA  Currently on lantus 8 qday & moderate SSI; also getting Solucortef 100 mg bid> decreased to 50 bid to start 5/20 PM  CBGs 152-212; CBGs 186-209 after TPN increased to 80 ml/hr  Required 22 units SSI yesterday Electrolytes: K 3.4 after repletion yesterday (40 meq); Mg 2.6; still elevated but trending down; Phos low @ 1.9; CoCa 9.54; Co2 remains 30 despite change to Max Cl in TPN Renal: Postop AKI w/ SCr peaking at 4 (baseline 1.4) continues to improve but still slightly elevated above BL; BUN elevated/stable; bicarb WNL but trending up; UOP improved to >1 ml/kg/hr Hepatic: LFTs stable WNL; mild bump in tbili resolved; Trig 98 (5/10) Preabumin 10 5/19 I/O:  MIVF: NS 10 ml/hr  I/O + 2174.7 mls  Relistor x 2 doses; scheduled IV reglan q6h & miralax bid  gastrograffin x 1 5/19 for SBO protocol, tap water enemas   NG output 750 ml/24 hrs; only clamping after meds  UOP 650 ml/24 hrs GI Imaging:  - 5/18 CT A/P: ileus vs distal SBO 5/19 KUB: diffuse gaseous bowel distention persists GI Surgeries / Procedures: none  Central access: CVC; PICC placed 5/19 for TPN TPN start date: 01/23/21  Nutritional Goals (RD recommendations 5/18): Kcal:  1880-2090 kcal Protein:  90-100 grams Fluid:  >/= 2 L/day  Current TPN at goal rate of 80 mL/hr provides 106 g of protein, 1978 kcals per day  Keep K >/= 4 and Mg >/=2 with ileus  Current Nutrition:  NPO, TPN  Plan:   Now:  Kphos 30 mMol (44  meq K)   continue TPN @ 36mL/hr at 1800- provides 100% of goals  Electrolytes in TPN:  Na - 69mEq/L  Increase K -9mEq/L  Ca - 19mEq/L  Mg - 45mEq/L  Phos - 76mmol/L  Cl:Ac ratio: max Cl  Add standard MVI and trace elements to TPN  Continue moderate scale SSI q4h and adjust as needed   Continue Lantus 8 units qday per TRH  Will not add insulin to TPN today d/t solucortef dose decreased today  [facility]F [facility] at [facility]  Monitor TPN labs on Mon/Thurs  Bmet, Mg, Phos tomorrow  Eudelia Bunch, Pharm.D 01/25/2021 8:40 AM

## 2021-01-26 ENCOUNTER — Inpatient Hospital Stay (HOSPITAL_COMMUNITY): Payer: Medicare Other

## 2021-01-26 ENCOUNTER — Encounter (HOSPITAL_COMMUNITY): Payer: Self-pay | Admitting: Specialist

## 2021-01-26 DIAGNOSIS — N179 Acute kidney failure, unspecified: Secondary | ICD-10-CM | POA: Diagnosis not present

## 2021-01-26 DIAGNOSIS — Z8719 Personal history of other diseases of the digestive system: Secondary | ICD-10-CM

## 2021-01-26 DIAGNOSIS — E440 Moderate protein-calorie malnutrition: Secondary | ICD-10-CM

## 2021-01-26 DIAGNOSIS — Z860101 Personal history of adenomatous and serrated colon polyps: Secondary | ICD-10-CM | POA: Insufficient documentation

## 2021-01-26 DIAGNOSIS — E861 Hypovolemia: Secondary | ICD-10-CM

## 2021-01-26 DIAGNOSIS — I4891 Unspecified atrial fibrillation: Secondary | ICD-10-CM

## 2021-01-26 DIAGNOSIS — K567 Ileus, unspecified: Secondary | ICD-10-CM | POA: Diagnosis not present

## 2021-01-26 DIAGNOSIS — K219 Gastro-esophageal reflux disease without esophagitis: Secondary | ICD-10-CM | POA: Diagnosis present

## 2021-01-26 DIAGNOSIS — K5730 Diverticulosis of large intestine without perforation or abscess without bleeding: Secondary | ICD-10-CM | POA: Insufficient documentation

## 2021-01-26 DIAGNOSIS — I9589 Other hypotension: Secondary | ICD-10-CM | POA: Diagnosis not present

## 2021-01-26 DIAGNOSIS — Z86010 Personal history of colonic polyps: Secondary | ICD-10-CM | POA: Insufficient documentation

## 2021-01-26 DIAGNOSIS — R197 Diarrhea, unspecified: Secondary | ICD-10-CM | POA: Insufficient documentation

## 2021-01-26 DIAGNOSIS — I480 Paroxysmal atrial fibrillation: Secondary | ICD-10-CM | POA: Diagnosis not present

## 2021-01-26 DIAGNOSIS — D62 Acute posthemorrhagic anemia: Secondary | ICD-10-CM

## 2021-01-26 DIAGNOSIS — K922 Gastrointestinal hemorrhage, unspecified: Secondary | ICD-10-CM

## 2021-01-26 DIAGNOSIS — K580 Irritable bowel syndrome with diarrhea: Secondary | ICD-10-CM

## 2021-01-26 LAB — CBC
HCT: 23.5 % — ABNORMAL LOW (ref 39.0–52.0)
Hemoglobin: 7.5 g/dL — ABNORMAL LOW (ref 13.0–17.0)
MCH: 31.4 pg (ref 26.0–34.0)
MCHC: 31.9 g/dL (ref 30.0–36.0)
MCV: 98.3 fL (ref 80.0–100.0)
Platelets: 204 10*3/uL (ref 150–400)
RBC: 2.39 MIL/uL — ABNORMAL LOW (ref 4.22–5.81)
RDW: 15.8 % — ABNORMAL HIGH (ref 11.5–15.5)
WBC: 17.3 10*3/uL — ABNORMAL HIGH (ref 4.0–10.5)
nRBC: 1 % — ABNORMAL HIGH (ref 0.0–0.2)

## 2021-01-26 LAB — HEPARIN LEVEL (UNFRACTIONATED)
Heparin Unfractionated: 0.28 IU/mL — ABNORMAL LOW (ref 0.30–0.70)
Heparin Unfractionated: 0.59 IU/mL (ref 0.30–0.70)

## 2021-01-26 LAB — BASIC METABOLIC PANEL
Anion gap: 11 (ref 5–15)
BUN: 72 mg/dL — ABNORMAL HIGH (ref 8–23)
CO2: 26 mmol/L (ref 22–32)
Calcium: 8.5 mg/dL — ABNORMAL LOW (ref 8.9–10.3)
Chloride: 106 mmol/L (ref 98–111)
Creatinine, Ser: 1.3 mg/dL — ABNORMAL HIGH (ref 0.61–1.24)
GFR, Estimated: 57 mL/min — ABNORMAL LOW (ref >60)
Glucose, Bld: 340 mg/dL — ABNORMAL HIGH (ref 70–99)
Potassium: 3.4 mmol/L — ABNORMAL LOW (ref 3.5–5.1)
Sodium: 143 mmol/L (ref 135–145)

## 2021-01-26 LAB — GLUCOSE, CAPILLARY
Glucose-Capillary: 199 mg/dL — ABNORMAL HIGH (ref 70–99)
Glucose-Capillary: 197 mg/dL — ABNORMAL HIGH (ref 70–99)
Glucose-Capillary: 199 mg/dL — ABNORMAL HIGH (ref 70–99)
Glucose-Capillary: 195 mg/dL — ABNORMAL HIGH (ref 70–99)
Glucose-Capillary: 171 mg/dL — ABNORMAL HIGH (ref 70–99)
Glucose-Capillary: 166 mg/dL — ABNORMAL HIGH (ref 70–99)

## 2021-01-26 LAB — MAGNESIUM: Magnesium: 2 mg/dL (ref 1.7–2.4)

## 2021-01-26 LAB — HEMOGLOBIN AND HEMATOCRIT, BLOOD
HCT: 21.3 % — ABNORMAL LOW (ref 39.0–52.0)
HCT: 23.9 % — ABNORMAL LOW (ref 39.0–52.0)
Hemoglobin: 6.8 g/dL — CL (ref 13.0–17.0)
Hemoglobin: 7.8 g/dL — ABNORMAL LOW (ref 13.0–17.0)

## 2021-01-26 LAB — PREPARE RBC (CROSSMATCH)

## 2021-01-26 LAB — PHOSPHORUS: Phosphorus: 2.4 mg/dL — ABNORMAL LOW (ref 2.5–4.6)

## 2021-01-26 LAB — ABO/RH: ABO/RH(D): O POS

## 2021-01-26 MED ORDER — INSULIN GLARGINE 100 UNIT/ML ~~LOC~~ SOLN
8.0000 [IU] | Freq: Two times a day (BID) | SUBCUTANEOUS | Status: DC
  Administered 2021-01-26 – 2021-01-30 (×9): 8 [IU] via SUBCUTANEOUS
  Filled 2021-01-26 (×9): qty 0.08

## 2021-01-26 MED ORDER — POTASSIUM CHLORIDE 10 MEQ/50ML IV SOLN
10.0000 meq | INTRAVENOUS | Status: DC
  Filled 2021-01-26: qty 50

## 2021-01-26 MED ORDER — BISACODYL 10 MG RE SUPP
10.0000 mg | Freq: Every day | RECTAL | Status: DC
  Administered 2021-01-26 – 2021-01-30 (×4): 10 mg via RECTAL
  Filled 2021-01-26 (×9): qty 1

## 2021-01-26 MED ORDER — LACTATED RINGERS IV BOLUS: 1000.0000 mL | Freq: Three times a day (TID) | INTRAVENOUS | Status: AC | PRN

## 2021-01-26 MED ORDER — PANTOPRAZOLE SODIUM 40 MG IV SOLR
40.0000 mg | Freq: Two times a day (BID) | INTRAVENOUS | Status: DC
  Administered 2021-01-30 – 2021-02-02 (×8): 40 mg via INTRAVENOUS
  Filled 2021-01-26 (×9): qty 40

## 2021-01-26 MED ORDER — SALINE SPRAY 0.65 % NA SOLN
1.0000 | Freq: Four times a day (QID) | NASAL | Status: DC | PRN
  Filled 2021-01-26: qty 44

## 2021-01-26 MED ORDER — METOPROLOL TARTRATE 5 MG/5ML IV SOLN
5.0000 mg | Freq: Two times a day (BID) | INTRAVENOUS | Status: DC
  Administered 2021-01-26: 5 mg via INTRAVENOUS
  Filled 2021-01-26: qty 5

## 2021-01-26 MED ORDER — ALUM & MAG HYDROXIDE-SIMETH 200-200-20 MG/5ML PO SUSP
30.0000 mL | Freq: Four times a day (QID) | ORAL | Status: DC | PRN
  Administered 2021-01-28 – 2021-02-02 (×4): 30 mL via ORAL
  Filled 2021-01-26 (×5): qty 30

## 2021-01-26 MED ORDER — POTASSIUM CHLORIDE 10 MEQ/100ML IV SOLN
10.0000 meq | INTRAVENOUS | Status: AC
  Administered 2021-01-26 (×3): 10 meq via INTRAVENOUS
  Filled 2021-01-26 (×3): qty 100

## 2021-01-26 MED ORDER — SODIUM CHLORIDE 0.9 % IV SOLN
8.0000 mg | Freq: Four times a day (QID) | INTRAVENOUS | Status: DC | PRN
  Filled 2021-01-26: qty 4

## 2021-01-26 MED ORDER — SIMETHICONE 40 MG/0.6ML PO SUSP: 80.0000 mg | Freq: Four times a day (QID) | ORAL | Status: DC | PRN

## 2021-01-26 MED ORDER — MAGIC MOUTHWASH
15.0000 mL | Freq: Four times a day (QID) | ORAL | Status: DC | PRN
  Administered 2021-01-26: 15 mL via ORAL
  Filled 2021-01-26 (×2): qty 15

## 2021-01-26 MED ORDER — PHENOL 1.4 % MT LIQD
2.0000 | OROMUCOSAL | Status: DC | PRN
  Administered 2021-02-04: 2 via OROMUCOSAL
  Filled 2021-01-26: qty 177

## 2021-01-26 MED ORDER — DIPHENHYDRAMINE HCL 50 MG/ML IJ SOLN: 12.5000 mg | Freq: Four times a day (QID) | INTRAMUSCULAR | Status: DC | PRN

## 2021-01-26 MED ORDER — SODIUM CHLORIDE 0.9% IV SOLUTION: Freq: Once | INTRAVENOUS | Status: AC

## 2021-01-26 MED ORDER — LIP MEDEX EX OINT
1.0000 "application " | TOPICAL_OINTMENT | Freq: Two times a day (BID) | CUTANEOUS | Status: DC
  Administered 2021-01-26 – 2021-02-06 (×20): 1 via TOPICAL
  Filled 2021-01-26 (×2): qty 7

## 2021-01-26 MED ORDER — ONDANSETRON HCL 4 MG/2ML IJ SOLN: 4.0000 mg | Freq: Four times a day (QID) | INTRAMUSCULAR | Status: DC | PRN

## 2021-01-26 MED ORDER — PANTOPRAZOLE SODIUM 40 MG IV SOLR
40.0000 mg | Freq: Once | INTRAVENOUS | Status: AC
  Administered 2021-01-26: 40 mg via INTRAVENOUS
  Filled 2021-01-26: qty 40

## 2021-01-26 MED ORDER — SODIUM CHLORIDE 0.9 % IV SOLN
8.0000 mg/h | INTRAVENOUS | Status: AC
  Administered 2021-01-26 – 2021-01-29 (×7): 8 mg/h via INTRAVENOUS
  Filled 2021-01-26 (×8): qty 80

## 2021-01-26 MED ORDER — NAPHAZOLINE-GLYCERIN 0.012-0.25 % OP SOLN
1.0000 [drp] | Freq: Four times a day (QID) | OPHTHALMIC | Status: DC | PRN
Start: 2021-01-26 — End: 2021-02-06
  Filled 2021-01-26: qty 15

## 2021-01-26 MED ORDER — METOPROLOL TARTRATE 5 MG/5ML IV SOLN
5.0000 mg | Freq: Four times a day (QID) | INTRAVENOUS | Status: DC
  Administered 2021-01-26 – 2021-01-27 (×3): 5 mg via INTRAVENOUS
  Filled 2021-01-26 (×3): qty 5

## 2021-01-26 MED ORDER — MENTHOL 3 MG MT LOZG: 1.0000 | LOZENGE | OROMUCOSAL | Status: DC | PRN

## 2021-01-26 MED ORDER — ACETAMINOPHEN 650 MG RE SUPP
650.0000 mg | Freq: Four times a day (QID) | RECTAL | Status: DC | PRN
  Administered 2021-01-30: 650 mg via RECTAL
  Filled 2021-01-26: qty 1

## 2021-01-26 MED ORDER — SIMETHICONE 40 MG/0.6ML PO SUSP
80.0000 mg | Freq: Four times a day (QID) | ORAL | Status: DC
  Administered 2021-01-26 – 2021-01-28 (×7): 80 mg
  Filled 2021-01-26 (×8): qty 1.2

## 2021-01-26 MED ORDER — SODIUM CHLORIDE 0.9 % IV SOLN
20.0000 mmol | Freq: Once | INTRAVENOUS | Status: AC
  Administered 2021-01-26: 20 mmol via INTRAVENOUS
  Filled 2021-01-26: qty 6.67

## 2021-01-26 MED ORDER — TRAVASOL 10 % IV SOLN
INTRAVENOUS | Status: AC
  Filled 2021-01-26: qty 1056

## 2021-01-26 MED ORDER — ACETAMINOPHEN 650 MG RE SUPP: 325.0000 mg | Freq: Four times a day (QID) | RECTAL | Status: DC | PRN

## 2021-01-26 NOTE — Progress Notes (Signed)
   Subjective: 10 Days Post-Op Procedure(s) (LRB): TOTAL KNEE ARTHROPLASTY (Left)  Pt in ICU with small bowel obstruction and possible upper GI bleed Minimal pain/soreness in the left knee currently Continued pain/discomfort in the abdomen - ultrasound performed this moring Pt feels weak and HGB continues to drop Patient reports pain as mild.  Objective:   VITALS:   Vitals:   01/26/21 0855 01/26/21 0900  BP:  (!) 141/48  Pulse: (!) 103 (!) 104  Resp: (!) 32 (!) 31  Temp:    SpO2: 92% 92%    Left knee incision healing well Mild stiffness and guarding with rom nv intact distally abd - diffuse tenderness and distension  LABS Recent Labs    01/24/21 0338 01/25/21 0525 01/25/21 1224 01/25/21 2104 01/26/21 0427  HGB 10.5* 9.4* 8.5* 8.2* 7.5*  HCT 31.5* 28.8* 25.7* 25.1* 23.5*  WBC 13.7* 18.0*  --   --  17.3*  PLT 176 189  --   --  204    Recent Labs    01/24/21 0338 01/25/21 0525 01/26/21 0427  NA 138 144 143  K 3.0* 3.4* 3.4*  BUN 62* 65* 72*  CREATININE 1.63* 1.30* 1.30*  GLUCOSE 160* 208* 340*     Assessment/Plan: 10 Days Post-Op Procedure(s) (LRB): TOTAL KNEE ARTHROPLASTY (Left) Appreciate critical care team management  Will defer treatment of anemia to medical team Left total knee stable at this point and gentle therapy will continue Will continue to monitor his progress    Merla Riches PA-C, [facility] [employer] [facility] [address], [place], [place], [place] [address] Phone: [phone] [email or web address] Facebook  [employer]

## 2021-01-26 NOTE — Progress Notes (Signed)
[facility] for Heparin Indication: AFib  Allergies  Allergen Reactions  . Penicillins Hives and Other (See Comments)    "cillins" Has patient had a PCN reaction causing immediate rash, facial/tongue/throat swelling, SOB or lightheadedness with hypotension: unknown Has patient had a PCN reaction causing severe rash involving mucus membranes or skin necrosis: unknown Has patient had a PCN reaction that required hospitalization no Has patient had a PCN reaction occurring within the last 10 years:no If all of the above answers are "NO", then may proceed with Cephalosporin use.     Patient Measurements: Height: 5\' 8"  (172.7 cm) Weight: 109.1 kg (240 lb 8.4 oz) IBW/kg (Calculated) : 68.4 Heparin Dosing Weight: 91 kg  Vital Signs: Temp: 99.6 F (37.6 C) (05/20 2000) Temp Source: Axillary (05/20 2000) BP: 168/63 (05/20 1840) Pulse Rate: 101 (05/20 1840)  Labs: Recent Labs    01/23/21 0454 01/23/21 0730 01/23/21 1410 01/24/21 0338 01/25/21 0525 01/25/21 1224 01/25/21 2104  HGB 10.0*  --   --  10.5* 9.4* 8.5* 8.2*  HCT 29.9*  --   --  31.5* 28.8* 25.7* 25.1*  PLT 156  --   --  176 189  --   --   HEPARINUNFRC 0.25*  --  0.32 0.30 0.27*  --  0.28*  CREATININE  --    < > 1.86* 1.63* 1.30*  --   --    < > = values in this interval not displayed.    Estimated Creatinine Clearance: 58.8 mL/min (A) (by C-G formula based on SCr of 1.3 mg/dL (H)).  Medications:   No oral anticoagulation PTA  Heparin 5000 units sq q8h for VTE prophylaxis started 01/20/2021 (last dose charted @ 23:00 on 5/15)  Assessment: 40 yoM admitted for L TKA, done 5/11. Postop course complicated by ileus and respiratory failure from likely aspiration. Additionally, on 5/15, noted to be in [place] and with elevated D-dimer. Pharmacy consulted to dose IV heparin for Afib and r/o VTE. VQ scan and LE Doppler negative for clot.  Significant Events: heparin drip stopped 5/20  am for bleeding from NGT.  Pt seen by GI.  Per GI note "Okay to resume heparin from GI perspective and monitor closely for any increase in bleeding"  01/26/2021   HL subtherapeutic on 2050 units/hr  Hgb down to 8.2;    per RN bloody drainage still coming from NGT  Goal of Therapy:  Heparin level 0.3-0.7 units/ml Monitor platelets by anticoagulation protocol: Yes   Plan:   Increase heparin to 2150 units/hr units/hr - f/u plans for continued anticoagulation  Heparin level in 8 hours  Monitor for signs and symptoms of bleeding  Daily CBC and heparin level  Dolly Rias RPh 01/26/2021, 2:53 AM

## 2021-01-26 NOTE — Progress Notes (Signed)
Physical Therapy Treatment Patient Details Name: [name] MRN: [id number] DOB: 13-Nov-1945 Today's Date: 01/26/2021    History of Present Illness Pt is a 75 year old male s/p left TKA on 01/16/21. Transferred to ICU on 5/15 due to respiratory failure, ileus, kidney failure    PT Comments    POD # 10 L TKR post Op ileus/A Fib Pt seen in ICU.  Spouse and Son at bedside.  Assisted OOB to amb was difficult.  General bed mobility comments: Required increased assist due to weakness and ABD girth inhibiting self mobility to sit to EOB. General transfer comment: pt required increased asist this session due to increased c/o weakness.  Assisted from elevated bed.  Used B platform EVA walker for increased support and to attempt greater upright activity time. General Gait Details: decreased amb distance this session due to increased c/o wealness/fatigue "I have had no food for days".  Used B Platform EVA walker this session for increased support and to attempt a greater amb distance.  Limited by generalized weakness and increased c/o dizziness. Recliner following for safety. Then performed a few TKR TE's.  Pain was only 3/10 with knee flex which pt did well approx 75 degrees.  Positioned in recliner to comfort.   Follow Up Recommendations  Follow surgeon's recommendation for DC plan and follow-up therapies;SNF     Equipment Recommendations  Rolling walker with 5" wheels;Wheelchair (measurements PT)    Recommendations for Other Services       Precautions / Restrictions Precautions Precautions: Fall;Knee Precaution Comments: NG tube, flexiseal, monitor HR/BP Required Braces or Orthoses: Knee Immobilizer - Right Knee Immobilizer - Left: On when out of bed or walking Restrictions Weight Bearing Restrictions: No LLE Weight Bearing: Weight bearing as tolerated    Mobility  Bed Mobility Overal bed mobility: Needs Assistance Bed Mobility: Supine to Sit     Supine to sit: +2 for physical  assistance;HOB elevated;+2 for safety/equipment;Max assist     General bed mobility comments: Required increased assist due to weakness and ABD girth inhibiting self mobility to sit to EOB.    Transfers Overall transfer level: Needs assistance Equipment used: Rolling walker (2 wheeled) Transfers: Sit to/from Stand Sit to Stand: Max assist;+2 physical assistance;From elevated surface;+2 safety/equipment         General transfer comment: pt required increased asist this session due to increased c/o weakness.  Assisted from elevated bed.  Used B platform EVA walker for increased support and to attempt greater upright activity time.  Ambulation/Gait Ambulation/Gait assistance: Max assist;+2 physical assistance;+2 safety/equipment Gait Distance (Feet): 5 Feet Assistive device: Rolling walker (2 wheeled) Gait Pattern/deviations: Step-to pattern;Decreased stance time - left;Trunk flexed;Decreased weight shift to left;Decreased stride length Gait velocity: decreased   General Gait Details: decreased amb distance this session due to increased c/o wealness/fatigue "I have had no food for days".  Used B Platform EVA walker this session for increased support and to attempt a greater amb distance.  Limited by generalized weakness and increased c/o dizziness. Recliner following for safety.   Stairs             Wheelchair Mobility    Modified Rankin (Stroke Patients Only)       Balance                                            Cognition Arousal/Alertness: Awake/alert Behavior During  Therapy: WFL for tasks assessed/performed Overall Cognitive Status: Within Functional Limits for tasks assessed                                 General Comments: AxO x 3 [place].  Extended LOS due to GI issues feeling very weak and drained of enegry.      Exercises   Total Knee Replacement TE's following HEP handout 10 reps B LE ankle pumps 05 reps towel  squeezes 05 reps knee presses 05 reps heel slides AAROM approx to 75" 05 reps SLR's AAROM 05 reps ABD AAROM     General Comments        Pertinent Vitals/Pain Pain Assessment: Faces Faces Pain Scale: Hurts a little bit Pain Location: ABD distention and L knee 3/10 Pain Descriptors / Indicators: Discomfort Pain Intervention(s): Monitored during session;Premedicated before session;Repositioned    Home Living                      Prior Function            PT Goals (current goals can now be found in the care plan section) Progress towards PT goals: Progressing toward goals    Frequency    7X/week      PT Plan Current plan remains appropriate    Co-evaluation              AM-PAC PT "6 Clicks" Mobility   Outcome Measure  Help needed turning from your back to your side while in a flat bed without using bedrails?: A Lot Help needed moving from lying on your back to sitting on the side of a flat bed without using bedrails?: A Lot Help needed moving to and from a bed to a chair (including a wheelchair)?: A Lot Help needed standing up from a chair using your arms (e.g., wheelchair or bedside chair)?: A Lot Help needed to walk in hospital room?: A Lot Help needed climbing 3-5 steps with a railing? : Total 6 Click Score: 11    End of Session Equipment Utilized During Treatment: Gait belt;Left knee immobilizer Activity Tolerance: Patient limited by fatigue Patient left: in chair;with call bell/phone within reach;with chair alarm set Nurse Communication: Mobility status PT Visit Diagnosis: Other abnormalities of gait and mobility (R26.89)     Time: 9390-3009 PT Time Calculation (min) (ACUTE ONLY): 40 min  Charges:  $Gait Training: 8-22 mins $Therapeutic Exercise: 8-22 mins $Therapeutic Activity: 8-22 mins                     Rica Koyanagi  PTA Acute  Rehabilitation Services Pager      [phone] Office      [phone]

## 2021-01-26 NOTE — Consult Note (Addendum)
I believe request made for general surgery consultation this evening, HD#10.  I received a page request.  No answer not able to reach hospitalist.  Notes from the hospitalist suggest they were going to request general surgery consultation.  Obese male with multiple medical problems with history of IBS and prior gastritis followed by [facility] gastroenterology.  Sounds like had an episode of ileus versus partial obstruction resolved nonoperatively when he was up in [place], [place].  I could not find these records through Care Everywhere  Concern for ileus versus small bowel obstruction after knee surgery 10 days ago.  Has had many postoperative issues including respiratory failure ileus and decline.  In reviewing the initial CT scan 5/15, the small bowel diffusely dilated and there is gas in the colon.  This looks more like an ileus picture than true obstruction.  Small bowel protocol started 2 days ago, 5/19.  Delayed film today shows contrast in cecum, arguing against true obstruction at this time.  Patient did have bowel movement today but is distended.  Try per rectal regimen only at this point with bisacodyl daily.  Sounds like he had prior enema.  Patient on scheduled Reglan for presumed ileus etiology - defer to GI  There is no strong evidence of peritonitis or decline, back on pressors, worsening films.  Contrast going to colon argues against any obstruction and even if there was one, it seems to be resolved.   Nasogastric tube coiled within itself in the stomach.  Pull back 15 cm to help straighten out.  I wrote my usual NG tube flushes and evaluation.  Add simethicone for bloating to see if calm down.  Do not know if Carafate is of much use and would defer to gastroenterology. Not much coming out of the NG tube at this point given how fragile and difficult his course has been, I would go more slowly and start low volume clears & nasogastric tube clamping trial.  If he has high gastric residuals or  worsening pain/nausea, return back to low intermittent wall suction .  General surgery will evaluate and at the very least, formal note in the morning.  If better in the morning with bowel movements and improved films, can consider removing NG tube and doing clears/dysphagia 1.  Continue TNA until ileus resolved with adequate PO.  Often is helpful to get the NG tube out when there is gastric irritation/bleeding.  However I want to make sure his ileus resolved clinically more convincingly before pulling it.  More pressing issue seems to be worsening symptomatic anemia.  He had postoperative atrial fibrillation & was on full anticoagulation with heparin.  Had dark NG tube output so anticoagulation held yesterday morning.  Seemed perhaps bilious so heparin restarted.  His hemoglobin has fallen again.  Atrial fibrillation now requiring more aggressive control by cardiology.  They are holding anticoagulation again.    I think it would be reasonable to do more aggressive proton pump inhibitor drip with extra bolus and continuous rate for 48 hours.  Gastroenterology can adjust in the morning.  Given the fact the patient had drop in hemoglobin on twice daily PPIs makes me feel inclined to be more aggressive.  Could consider EGD if worse but would hold off.  History of acute gastritis 2019 by prior upper endoscopy that was Hpylori negative.  Defer to GI if they wish to repeat

## 2021-01-26 NOTE — Progress Notes (Addendum)
Date and time results received: 01/26/21  1240  (use smartphrase ".now" to insert current time)  Test: HGB  Critical Value: 6.8  Name of Provider Notified: Stephani Police, and [place]  Orders Received? Or Actions Taken?: Orders Received - See Orders for details

## 2021-01-26 NOTE — Plan of Care (Signed)
  Problem: Activity: Goal: Risk for activity intolerance will decrease Outcome: Progressing   Problem: Pain Managment: Goal: General experience of comfort will improve Outcome: Progressing   Problem: Safety: Goal: Ability to remain free from injury will improve Outcome: Progressing   

## 2021-01-26 NOTE — Consult Note (Signed)
CARDIOLOGY CONSULT NOTE     Primary Care Physician: Bing Neighbors, NP Referring Physician:  Dr Nevada Crane Primary Cardiologist:  Rockey Situ Admit Date: 01/16/2021  Reason for consultation:  afib  [name] is a 75 y.o. 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  The patient had developed ilius vs SBO.  The patient was also noted to have post operative afib.  He denies prior h/o afib.  He was given IV lopressor and started on IV heparin.  He has subsequently developed GI bleeding.    Today, he denies symptoms of palpitations, chest pain, shortness of breath,  lower extremity edema, or syncope.  Past Medical History:  Diagnosis Date  . Cancer ([place]) 2011   lymphoma L leg  . Diabetes mellitus without complication ([place])   . GERD (gastroesophageal reflux disease)   . Hx of lymphoma   . Hyperlipidemia   . Hypertension   . Hypotestosteronemia   . Sleep apnea 2013   wears CPAP   Past Surgical History:  Procedure Laterality Date  . CARDIAC CATHETERIZATION     Dr. Sabra Heck  . RIGHT/LEFT HEART CATH AND CORONARY ANGIOGRAPHY N/A 01/02/2017   Procedure: Right/Left Heart Cath and Coronary Angiography;  Surgeon: Jolaine Artist, MD;  Location: [facility];  Service: Cardiovascular;  Laterality: N/A;  . TOTAL KNEE ARTHROPLASTY Left 01/16/2021   Procedure: TOTAL KNEE ARTHROPLASTY;  Surgeon: Susa Day, MD;  Location: [facility];  Service: Orthopedics;  Laterality: Left;  2.5 hrs General vs Spinal    . allopurinol  100 mg Per Tube QHS  . Chlorhexidine Gluconate Cloth  6 each Topical Daily  . insulin aspart  0-15 Units Subcutaneous Q4H  . insulin glargine  8 Units Subcutaneous BID  . mouth rinse  15 mL Mouth Rinse BID  . metoCLOPramide (REGLAN) injection  10 mg Intravenous Q6H  . metoprolol tartrate  5 mg  Intravenous Q6H  . pantoprazole (PROTONIX) IV  40 mg Intravenous Q12H  . polyethylene glycol  17 g Per Tube BID  . sodium chloride flush  10-40 mL Intracatheter Q12H   . sodium chloride Stopped (01/24/21 1744)  . amiodarone 30 mg/hr (01/26/21 0859)  . TPN ADULT (ION) 80 mL/hr at 01/26/21 9381  . TPN ADULT (ION)      Allergies  Allergen Reactions  . Penicillins Hives and Other (See Comments)    "cillins" Has patient had a PCN reaction causing immediate rash, facial/tongue/throat swelling, SOB or lightheadedness with hypotension: unknown Has patient had a PCN reaction causing severe rash involving mucus membranes or skin necrosis: unknown Has patient had a PCN reaction that required hospitalization no Has patient had a PCN reaction occurring within the last 10 years:no If all of the above answers are "NO", then may proceed with Cephalosporin use.     Social History   Socioeconomic History  . Marital status: Married    Spouse name: Not on file  . Number of children: Not on file  . Years of education: Not on file  . Highest education level: Not on file  Occupational History  . Not on file  Tobacco Use  . Smoking status: Never Smoker  . Smokeless tobacco: Never Used  Vaping Use  . Vaping Use: Never used  Substance and Sexual Activity  . Alcohol use: No  . Drug use: No  .  Sexual activity: Not on file  Other Topics Concern  . Not on file  Social History Narrative  . Not on file   Social Determinants of Health   Financial Resource Strain: Not on file  Food Insecurity: Not on file  Transportation Needs: Not on file  Physical Activity: Not on file  Stress: Not on file  Social Connections: Not on file  Intimate Partner Violence: Not on file    Family History  Problem Relation Age of Onset  . Heart disease Father   . Heart attack Father        x3  . Colon cancer Neg Hx   . Rectal cancer Neg Hx   . Stomach cancer Neg Hx   . Esophageal cancer Neg Hx     ROS- All  systems are reviewed and negative except as per the HPI above  Physical Exam: Telemetry:  Currently in sinus,  afib with RVR noted Vitals:   01/26/21 1500 01/26/21 1535 01/26/21 1600 01/26/21 1630  BP: (!) 140/48 (!) 124/41 (!) 130/34 (!) 141/63  Pulse: 100 (!) 102 (!) 101 99  Resp: (!) 26 (!) 27 (!) 29 (!) 27  Temp:      TempSrc:      SpO2: 95% 94% 93% 95%  Weight:      Height:        GEN- The patient is ill appearing, alert and oriented x 3 today.   Head- normocephalic, atraumatic Eyes-  Sclera clear, conjunctiva pink Ears- hearing intact Oropharynx- clear Neck- supple, no JVP Lungs- Clear to ausculation bilaterally, normal work of breathing Heart- Regular rate and rhythm GI- + distended Extremities- + dependant edema MS- no significant deformity or atrophy Skin- no rash or lesion Psych- euthymic mood, full affect Neuro- strength and sensation are intact  EKG: 5//15/22- afib with RBBB, 146 bpm  Labs:   Lab Results  Component Value Date   WBC 17.3 (H) 01/26/2021   HGB 6.8 (LL) 01/26/2021   HCT 21.3 (L) 01/26/2021   MCV 98.3 01/26/2021   PLT 204 01/26/2021    Recent Labs  Lab 01/24/21 0338 01/25/21 0525 01/26/21 0427  NA 138   < > 143  K 3.0*   < > 3.4*  CL 102   < > 106  CO2 29   < > 26  BUN 62*   < > 72*  CREATININE 1.63*   < > 1.30*  CALCIUM 8.6*   < > 8.5*  PROT 5.6*  --   --   BILITOT 1.1  --   --   ALKPHOS 42  --   --   ALT 18  --   --   AST 17  --   --   GLUCOSE 160*   < > 340*   < > = values in this interval not displayed.   No results found for: CKTOTAL, CKMB, CKMBINDEX, TROPONINI   Echo 01/21/21- EF 50-55%, moderate RV dysfunction  ASSESSMENT AND PLAN:   1. afib Occurring in the setting of acute medical illness/ post operative state. Continue IV amiodarone for now Stop IV heparin.  Not currently a candidate for anticoagulation given active bleeding.  We will consider resumption of anticoagulation once source of bleeding has been  assessed Can use metoprolol prn for RVR Could also consider IV dilt drip if AF returns and sustains. Hopefully AF issues will self correct once his medical illness is improved.  2. Acute blood loss anemia Stop heparin (as above)  3. HTN  Stable No change required today  4. CAD  No ischemic symptoms   Thompson Grayer, MD 01/26/2021  4:43 PM

## 2021-01-26 NOTE — Progress Notes (Addendum)
   Patient Name: [name] Date of Encounter: 01/26/2021, 7:21 AM    Subjective  "I fell a little better, less pain"   Objective  BP 140/60   Pulse 95   Temp 98.9 F (37.2 C) (Axillary)   Resp (!) 29   Ht 5\' 8"  (1.727 m)   Wt 109.1 kg   SpO2 93%   BMI 36.57 kg/m  Obese elderly WM w/ NGT draining dark brown liquid abd is distended, tympanitic, mildly tender LUQ and BS decreased  CBC Latest Ref Rng & Units 01/26/2021 01/25/2021 01/25/2021  WBC 4.0 - 10.5 K/uL 17.3(H) - -  Hemoglobin 13.0 - 17.0 g/dL 7.5(L) 8.2(L) 8.5(L)  Hematocrit 39.0 - 52.0 % 23.5(L) 25.1(L) 25.7(L)  Platelets 150 - 400 K/uL 204 - -   Lab Results  Component Value Date   CREATININE 1.30 (H) 01/26/2021   BUN 72 (H) 01/26/2021   NA 143 01/26/2021   K 3.4 (L) 01/26/2021   CL 106 01/26/2021   CO2 26 01/26/2021   Mg++ = 2  DG Abd Portable 1V-Small Bowel Obstruction Protocol-24 hr delay CLINICAL DATA:  A 24 hour delay film for small-bowel obstruction.  EXAM: PORTABLE ABDOMEN - 1 VIEW  COMPARISON:  Film from the previous day.  FINDINGS: Gastric catheter is again noted coiled within the stomach. Persistent small bowel dilatation is noted although colonic gas is again seen. By history contrast was administered 24 hours previous and contrast is noted within the distal small bowel predominately within the right lower quadrant. No significant colonic contrast is noted. No free air is seen.  IMPRESSION: Persistent and stable small bowel dilatation when compared with the prior exam. Previously administered contrast material lies in the distal aspect of the small bowel.  Electronically Signed   By: Inez Catalina M.D.   On: 01/25/2021 15:19    Intake/Output Summary (Last 24 hours) at 01/26/2021 0737 Last data filed at 01/26/2021 4132 Gross per 24 hour  Intake 3316.92 ml  Output 5200 ml  Net -1883.08 ml      Assessment and Plan  Ileus vs SBO - not progressing Hypokalemia Brown NG output -  I do not think major bleeding Slight decrease Hgb - not surprising overall   Can we supplement K more than we are? Still not at about 4 where it would be better  Await films  Move to chair  Depending upon what abd films show today will consider a repeat CT abd/pelvis ? Try to put contrast down NG tube?      Gatha Mayer, MD, [facility] 01/26/2021 7:21 AM

## 2021-01-26 NOTE — Progress Notes (Signed)
PROGRESS NOTE  [name] [id number] DOB: November 16, 1945 DOA: 01/16/2021 PCP: Bing Neighbors, NP  HPI/Recap of past 16 hours: 75 year old man with hx of SBO, hypertension, hyperlipidemia, GERD, type 2 diabetes, presenting for TKR complicated by ileus, LLL PNA, new onset perioperative A. fib with RVR, noted on 01/20/2021 and started on amiodarone drip and heparin drip in the ICU.  He had acute hypoxic respiratory failure and was transferred to the ICU on 01/20/2021 and was seen by PCCM.  TRH, hospitalist team was initially consulted for hypotension requiring vasopressors, acute kidney injury with a creatinine of 4 and constipation with work-up of ileus versus SBO.  NG tube placed on 01/20/2021.  On 01/25/2021 NGT output was noted to be coffee-ground for which GI was consulted.  Was also noted to have acute blood loss anemia.  On 01/26/2021 he was noted to have symptomatic anemia 2 unit PRBC were ordered to be transfused.  Heparin drip held.  Discussed with cardiology Dr. Rayann Heman through curbside.  We will continue to hold off on heparin drip until the source of bleeding is controlled.    01/26/21: Seen and examined with his wife and son at bedside.  He feels weak.    Assessment/Plan: Active Problems:   S/P TKR (total knee replacement) using cement   Acute respiratory distress   AKI (acute kidney injury) ([place])   Acute renal failure (HCC)   Constipation   Benign prostatic hyperplasia   OSA (obstructive sleep apnea)   Hypotension   Type 2 diabetes mellitus without complication (HCC)   Ileus (HCC)  Status post total left knee replacement 01/16/21 by Dr. [name] Management per primary, orthopedic surgery.  Small bowel obstruction History of small bowel obstruction per medical records NG tube to low intermittent suction Continue IV fluid hydration Replace electrolytes as indicated. Goal potassium greater than 4.0, goal magnesium greater than 2.0. Abdominal x-ray done on 01/25/2021 shows  persistent stable small bowel dilatation. Mobilize as able. PT to alternate with OT daily. Is currently on TPN No significant changes from abdominal x-ray done on 01/26/2021 Repeat abdominal x-ray in the morning. Consult general surgery  Acute blood loss anemia secondary to upper GI bleed. Gastric content sent on 01/25/2021 positive for occult blood. Drop of hemoglobin 6.8 on 01/26/2021 2 unit PRBCs ordered to be transfused. Hemoglobin 15.1 on 01/07/2021. Transfuse as indicated Seen by GI Currently on IV Protonix 40 mg twice daily.  New onset perioperative A. fib with RVR CHA2DS2-VASc of 4 Was noted on 01/20/2021: Patient was transferred to the ICU Was started on amiodarone drip and heparin drip on 01/20/2021 in the ICU. Currently on amiodarone drip and IV Lopressor for rate control Heparin drip has been DC'd on 01/26/2021 due to upper GI bleed. Curbsided with cardiology Dr. Rayann Heman on 01/26/2021, okay to hold off on heparin drip until source of bleeding is controlled. Repeat twelve-lead EKG. Continue to closely monitor on telemetry.  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 Continue to avoid nephrotoxic agents Continue to avoid dehydration from GI losses Monitor urine output Repeat chemistry panel in the morning  Resolved acute hypoxic respiratory failure secondary to left lower lobe pneumonia Completed IV antibiotics, course on Rocephin x5 days on 01/25/2021.   No longer requiring oxygen supplementation, 93% on room air. Mobilize as tolerated  OSA on CPAP Avoid use of CPAP and BiPAP at this time with NG tube in place with low intermittent suction Continue to  closely monitor  Uncontrolled hypertension DC Solu-Cortef, stress dose steroids on 01/25/2021 Started IV Lopressor scheduled since NPO Continue to monitor vital signs  Hypokalemia Potassium 3.4 Repleted with TPN Add IV potassium Goal potassium greater than 4.0 Goal magnesium  greater than 2.0.  Prediabetes with hyperglycemia likely contributed by IV steroid which was discontinued on 01/25/2021. Hemoglobin A1c 5.7 on 01/16/2021 Insulin sliding scale if needed Avoid hypoglycemia  Electrolyte derangement on TPN Hypokalemia, repleted with TPN Hyperglycemia, dose adjustment by pharmacy Hypophosphatemia, repleted with TPN Appreciate pharmacy's assistance  Elevated troponin Peaked at 23 No reported anginal symptoms. Heparin drip DC'd on 01/26/2021 due to upper GI bleed.  GERD Continue PPI Protonix 40 mg twice daily  Gout Hold off home regimen due to n.p.o.   Code Status: Full code.  Family Communication: Updated his wife and son at bedside.  Disposition Plan: Defer to primary team.   Thank you for allowing [place] to participate in the care of this patient.  We will continue to follow along with you.     Objective: Vitals:   01/26/21 1418 01/26/21 1430 01/26/21 1500 01/26/21 1535  BP: 140/72 125/60 (!) 140/48 (!) 124/41  Pulse: 100 (!) 101 100 (!) 102  Resp: (!) 29 (!) 32 (!) 26 (!) 27  Temp: 98.4 F (36.9 C)     TempSrc: Axillary     SpO2: 94% 93% 95% 94%  Weight:      Height:        Intake/Output Summary (Last 24 hours) at 01/26/2021 1547 Last data filed at 01/26/2021 1515 Gross per 24 hour  Intake 4937.21 ml  Output 4800 ml  Net 137.21 ml   Filed Weights   01/16/21 0650 01/20/21 1300 01/23/21 1122  Weight: 102.5 kg 104.4 kg 109.1 kg    Exam:  . General: 75 y.o. year-old male weak appearing in no acute distress.  Sitting up in a chair.  He is alert and oriented x3.  Hard of hearing.   . Cardiovascular: Irregular rate and rhythm no rubs or gallops.  Marland Kitchen Respiratory: Mild rales at bases no wheezing noted.  Good inspiratory effort. . Abdomen: Obese distended hypoactive bowel sounds present.  Nontender. . Musculoskeletal: Surgical dressing on left knee.  Left lower extremity edema noted. . Skin: No ulcerative lesions noted. Marland Kitchen Psychiatry:  Mood is appropriate for condition and setting.   Data Reviewed: CBC: Recent Labs  Lab 01/20/21 0401 01/20/21 1220 01/20/21 1730 01/21/21 0330 01/22/21 0330 01/23/21 0454 01/24/21 8242 01/25/21 0525 01/25/21 1224 01/25/21 2104 01/26/21 0427 01/26/21 1208  WBC 6.6 7.4   < > 6.1 6.0 8.5 13.7* 18.0*  --   --  17.3*  --   NEUTROABS 4.6 5.2  --  4.6  --   --  9.2*  --   --   --   --   --   HGB 14.4 13.4   < > 10.8* 9.7* 10.0* 10.5* 9.4* 8.5* 8.2* 7.5* 6.8*  HCT 42.6 39.1   < > 31.6* 28.5* 29.9* 31.5* 28.8* 25.7* 25.1* 23.5* 21.3*  MCV 94.7 94.2   < > 94.3 95.6 93.7 96.0 97.3  --   --  98.3  --   PLT 227 223   < > 159 138* 156 176 189  --   --  204  --    < > = values in this interval not displayed.   Basic Metabolic Panel: Recent Labs  Lab 01/20/21 0401 01/20/21 1217 01/22/21 0330 01/23/21 0730 01/23/21 1410 01/24/21  8119 01/25/21 0525 01/26/21 0427  NA 132*   < > 132* 137 139 138 144 143  K 4.3   < > 3.4* 3.1* 3.5 3.0* 3.4* 3.4*  CL 93*   < > 95* 98 99 102 108 106  CO2 26   < > 25 27 26 29 30 26   GLUCOSE 208*   < > 161* 121* 125* 160* 208* 340*  BUN 79*   < > 83* 73* 66* 62* 65* 72*  CREATININE 3.24*   < > 2.85* 1.95* 1.86* 1.63* 1.30* 1.30*  CALCIUM 9.6   < > 8.6* 8.7* 8.9 8.6* 8.5* 8.5*  MG 3.6*   < > 4.3* 3.9*  --  3.1* 2.6* 2.0  PHOS 6.0*  --   --  3.0  --  2.5 1.9* 2.4*   < > = values in this interval not displayed.   GFR: Estimated Creatinine Clearance: 58.8 mL/min (A) (by C-G formula based on SCr of 1.3 mg/dL (H)). Liver Function Tests: Recent Labs  Lab 01/20/21 0401 01/20/21 1217 01/24/21 0338  AST  --  40 17  ALT  --  36 18  ALKPHOS  --  53 42  BILITOT  --  2.0* 1.1  PROT  --  5.8* 5.6*  ALBUMIN 3.4* 2.7* 2.7*   No results for input(s): LIPASE, AMYLASE in the last 168 hours. No results for input(s): AMMONIA in the last 168 hours. Coagulation Profile: Recent Labs  Lab 01/21/21 0330  INR 1.2   Cardiac Enzymes: No results for input(s):  CKTOTAL, CKMB, CKMBINDEX, TROPONINI in the last 168 hours. BNP (last 3 results) No results for input(s): PROBNP in the last 8760 hours. HbA1C: No results for input(s): HGBA1C in the last 72 hours. CBG: Recent Labs  Lab 01/25/21 1922 01/25/21 2301 01/26/21 0352 01/26/21 0746 01/26/21 1127  GLUCAP 176* 210* 199* 197* 199*   Lipid Profile: Recent Labs    01/24/21 0338  TRIG 98   Thyroid Function Tests: No results for input(s): TSH, T4TOTAL, FREET4, T3FREE, THYROIDAB in the last 72 hours. Anemia Panel: No results for input(s): VITAMINB12, FOLATE, FERRITIN, TIBC, IRON, RETICCTPCT in the last 72 hours. Urine analysis:    Component Value Date/Time   COLORURINE AMBER (A) 01/19/2021 1622   APPEARANCEUR CLOUDY (A) 01/19/2021 1622   LABSPEC 1.018 01/19/2021 1622   PHURINE 5.0 01/19/2021 1622   GLUCOSEU NEGATIVE 01/19/2021 1622   HGBUR MODERATE (A) 01/19/2021 1622   BILIRUBINUR NEGATIVE 01/19/2021 1622   KETONESUR NEGATIVE 01/19/2021 1622   PROTEINUR 100 (A) 01/19/2021 1622   NITRITE NEGATIVE 01/19/2021 1622   LEUKOCYTESUR NEGATIVE 01/19/2021 1622   Sepsis Labs: @LABRCNTIP (procalcitonin:4,lacticidven:4)  )No results found for this or any previous visit (from the past 240 hour(s)).    Studies: DG Abd 2 Views  Result Date: 01/26/2021 CLINICAL DATA:  Abdominal distension. EXAM: ABDOMEN - 2 VIEW COMPARISON:  01/25/2021 FINDINGS: An NG tube is again noted with tip overlying the mid stomach. Gaseous distension of colon and small bowel are not significantly changed. Oral contrast administered on 01/24/2021 appears to lie within the cecum. IMPRESSION: Unchanged gaseous distension of colon and small bowel. Oral contrast administered 2 days ago lies within the cecum. Electronically Signed   By: Margarette Canada M.D.   On: 01/26/2021 10:41    Scheduled Meds: . allopurinol  100 mg Per Tube QHS  . Chlorhexidine Gluconate Cloth  6 each Topical Daily  . insulin aspart  0-15 Units Subcutaneous  Q4H  . insulin glargine  8 Units Subcutaneous BID  . mouth rinse  15 mL Mouth Rinse BID  . metoCLOPramide (REGLAN) injection  10 mg Intravenous Q6H  . metoprolol tartrate  5 mg Intravenous Q6H  . pantoprazole (PROTONIX) IV  40 mg Intravenous Q12H  . polyethylene glycol  17 g Per Tube BID  . sodium chloride flush  10-40 mL Intracatheter Q12H    Continuous Infusions: . sodium chloride Stopped (01/24/21 1744)  . amiodarone 30 mg/hr (01/26/21 0859)  . potassium PHOSPHATE IVPB (in mmol) 20 mmol (01/26/21 0947)  . TPN ADULT (ION) 80 mL/hr at 01/26/21 2233  . TPN ADULT (ION)       LOS: 10 days     Kayleen Memos, MD [facility] Pager [phone]  If 7PM-7AM, please contact night-coverage [email or web address] Password [email or web address] 01/26/2021, 3:47 PM

## 2021-01-26 NOTE — Progress Notes (Signed)
PHARMACY - TOTAL PARENTERAL NUTRITION CONSULT NOTE   Indication: Prolonged ileus  Patient Measurements: Height: 5\' 8"  (172.7 cm) Weight: 109.1 kg (240 lb 8.4 oz) IBW/kg (Calculated) : 68.4 TPN AdjBW (KG): 77.4 Body mass index is 36.57 kg/m. 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 Post op course complicated by Afib, aspiration PNA and prolonged ileus. Pharmacy consulted to manage TPN for nutrition support.  Glucose / Insulin: Hx DM2 on only metformin PTA  Currently on lantus 8 qday & moderate SSI; also getting Solucortef 100 mg bid dc'd 5/20. Last dose 5/20 @ 5 am  Serum CBG 340 but fingerstick 199  CBGs 175-210   Required 24 units SSI yesterday Electrolytes: K 3.4 after repletion yesterday (44 meq); Mg 2.0 (no Mag in TPN)); Phos 2.4 after 30 mM yesterday; CoCa 9.54; CO2 down to 26;  Max Cl in TPN Renal: Postop AKI w/ SCr peaking at 4 (baseline 1.4) still slightly elevated above BL; BUN elevated 72; bicarb WNL; UOP improved  Hepatic: LFTs stable WNL; mild bump in tbili resolved; Trig 98 (5/10) Preabumin 10 5/19 I/O:  MIVF: NS 10 ml/hr  I/O -1883  mls  Relistor x 2 doses; scheduled IV reglan q6h & miralax bid  gastrograffin x 1 5/19 for SBO protocol, tap water enemas   NG output 2650 ml/24 hrs;   2 stools recorded 5/20  UOP 2550 ml/24 hrs GI Imaging:  - 5/18 CT A/P: ileus vs distal SBO 5/19 KUB: diffuse gaseous bowel distention persists 5/20 KUB: persistent & stable SBO 5/21 KUB: ip GI Surgeries / Procedures: none  Central access: CVC; PICC placed 5/19 for TPN TPN start date: 01/23/21  Nutritional Goals (RD recommendations 5/18): Kcal:  1880-2090 kcal Protein:  90-100 grams Fluid:  >/= 2 L/day  Current TPN at goal rate of 80 mL/hr provides 106 g of protein, 1978 kcals per day  Keep K >/= 4 and Mg >/=2 with ileus  Current Nutrition:  NPO, TPN  Plan:   Now:  Kphos 20 mMol (29 meq K) and 3 runs K (30 meq K)     continue TPN @ 35mL/hr at 1800- provides 100% of goals  Electrolytes in TPN:  Na - 36mEq/L  Increase K -65 mEq/L  Ca - 77mEq/L  Increase Mg - 5 mEq/L  Phos - 38mmol/L  Cl:Ac ratio: max Cl  Add standard MVI and trace elements to TPN  Continue moderate scale SSI q4h and adjust as needed  Add insulin 15 units to TPN   Continue Lantus 8 units qday per TRH  MIVF NS at [facility] per MD  Monitor TPN labs on Mon/Thurs, Bmet, Mg, Phos tomorrow  Eudelia Bunch, Pharm.D 01/26/2021 7:16 AM

## 2021-01-26 NOTE — Progress Notes (Addendum)
[place] for Heparin Indication: AFib  Allergies  Allergen Reactions  . Penicillins Hives and Other (See Comments)    "cillins" Has patient had a PCN reaction causing immediate rash, facial/tongue/throat swelling, SOB or lightheadedness with hypotension: unknown Has patient had a PCN reaction causing severe rash involving mucus membranes or skin necrosis: unknown Has patient had a PCN reaction that required hospitalization no Has patient had a PCN reaction occurring within the last 10 years:no If all of the above answers are "NO", then may proceed with Cephalosporin use.     Patient Measurements: Height: 5\' 8"  (172.7 cm) Weight: 109.1 kg (240 lb 8.4 oz) IBW/kg (Calculated) : 68.4 Heparin Dosing Weight: 91 kg  Vital Signs: Temp: 98.2 F (36.8 C) (05/21 0800) Temp Source: Oral (05/21 0800) BP: 141/48 (05/21 0900) Pulse Rate: 104 (05/21 0900)  Labs: Recent Labs    01/24/21 0338 01/25/21 0525 01/25/21 1224 01/25/21 2104 01/26/21 0427  HGB 10.5* 9.4* 8.5* 8.2* 7.5*  HCT 31.5* 28.8* 25.7* 25.1* 23.5*  PLT 176 189  --   --  204  HEPARINUNFRC 0.30 0.27*  --  0.28*  --   CREATININE 1.63* 1.30*  --   --  1.30*    Estimated Creatinine Clearance: 58.8 mL/min (A) (by C-G formula based on SCr of 1.3 mg/dL (H)).  Medications:   No oral anticoagulation PTA  Heparin 5000 units sq q8h for VTE prophylaxis started 01/20/2021 (last dose charted @ 23:00 on 5/15)  Assessment: 43 yoM admitted for L TKA, done 5/11. Postop course complicated by ileus and respiratory failure from likely aspiration. Additionally, on 5/15, noted to be in [place] and with elevated D-dimer. Pharmacy consulted to dose IV heparin for Afib and r/o VTE. VQ scan and LE Doppler negative for clot.  Significant Events: heparin drip stopped 5/20 am for bleeding from NGT.  Pt seen by GI.  Per GI note "Okay to resume heparin from GI perspective and monitor closely for any increase  in bleeding"  01/26/2021   HL 5/21@ 2104 subtherapeutic 0.28 on 2050 units/hr- rate increased to 2150 units/hr @ 0410 am  8 hr heparin level 0.59 is therapeutic on 2150 units/hr  0427 AM Hgb, 7.5, noon Hgb down to 6.8    per RN bloody drainage still coming from NGT  5/21 GI " NGT draining dark brown liquid, I do not think this is major bleeding"   Goal of Therapy:  Heparin level 0.3-0.7 units/ml Monitor platelets by anticoagulation protocol: Yes   Plan:   continue heparin @ 2150 units/hr units/hr - f/u plans for continued anticoagulation with dropping Hg  Monitor for signs and symptoms of bleeding  Daily CBC and heparin level  Eudelia Bunch, Pharm.D 01/26/2021 1:19 PM  Addendum:  Heparin on hold per TRH, 2 U PRBCs ordered; getting cards c/o for anticoag recs  Eudelia Bunch, Pharm.D 01/26/2021 1:22 PM

## 2021-01-27 ENCOUNTER — Inpatient Hospital Stay (HOSPITAL_COMMUNITY): Payer: Medicare Other

## 2021-01-27 DIAGNOSIS — D500 Iron deficiency anemia secondary to blood loss (chronic): Secondary | ICD-10-CM | POA: Diagnosis not present

## 2021-01-27 DIAGNOSIS — K567 Ileus, unspecified: Secondary | ICD-10-CM | POA: Diagnosis not present

## 2021-01-27 DIAGNOSIS — K922 Gastrointestinal hemorrhage, unspecified: Secondary | ICD-10-CM | POA: Diagnosis not present

## 2021-01-27 LAB — CBC
HCT: 25.4 % — ABNORMAL LOW (ref 39.0–52.0)
Hemoglobin: 7.9 g/dL — ABNORMAL LOW (ref 13.0–17.0)
MCH: 29.8 pg (ref 26.0–34.0)
MCHC: 31.1 g/dL (ref 30.0–36.0)
MCV: 95.8 fL (ref 80.0–100.0)
Platelets: 178 10*3/uL (ref 150–400)
RBC: 2.65 MIL/uL — ABNORMAL LOW (ref 4.22–5.81)
RDW: 19.3 % — ABNORMAL HIGH (ref 11.5–15.5)
WBC: 17.4 10*3/uL — ABNORMAL HIGH (ref 4.0–10.5)
nRBC: 1.5 % — ABNORMAL HIGH (ref 0.0–0.2)

## 2021-01-27 LAB — BASIC METABOLIC PANEL
Anion gap: 11 (ref 5–15)
BUN: 83 mg/dL — ABNORMAL HIGH (ref 8–23)
CO2: 22 mmol/L (ref 22–32)
Calcium: 8.3 mg/dL — ABNORMAL LOW (ref 8.9–10.3)
Chloride: 111 mmol/L (ref 98–111)
Creatinine, Ser: 1.29 mg/dL — ABNORMAL HIGH (ref 0.61–1.24)
GFR, Estimated: 58 mL/min — ABNORMAL LOW (ref >60)
Glucose, Bld: 333 mg/dL — ABNORMAL HIGH (ref 70–99)
Potassium: 3.9 mmol/L (ref 3.5–5.1)
Sodium: 144 mmol/L (ref 135–145)

## 2021-01-27 LAB — GLUCOSE, CAPILLARY
Glucose-Capillary: 153 mg/dL — ABNORMAL HIGH (ref 70–99)
Glucose-Capillary: 176 mg/dL — ABNORMAL HIGH (ref 70–99)
Glucose-Capillary: 171 mg/dL — ABNORMAL HIGH (ref 70–99)
Glucose-Capillary: 209 mg/dL — ABNORMAL HIGH (ref 70–99)
Glucose-Capillary: 230 mg/dL — ABNORMAL HIGH (ref 70–99)
Glucose-Capillary: 148 mg/dL — ABNORMAL HIGH (ref 70–99)
Glucose-Capillary: 151 mg/dL — ABNORMAL HIGH (ref 70–99)

## 2021-01-27 LAB — HEMOGLOBIN AND HEMATOCRIT, BLOOD
HCT: 24.4 % — ABNORMAL LOW (ref 39.0–52.0)
HCT: 23.3 % — ABNORMAL LOW (ref 39.0–52.0)
Hemoglobin: 7.8 g/dL — ABNORMAL LOW (ref 13.0–17.0)
Hemoglobin: 7.3 g/dL — ABNORMAL LOW (ref 13.0–17.0)

## 2021-01-27 LAB — PHOSPHORUS: Phosphorus: 3.4 mg/dL (ref 2.5–4.6)

## 2021-01-27 LAB — MAGNESIUM: Magnesium: 2.1 mg/dL (ref 1.7–2.4)

## 2021-01-27 MED ORDER — POTASSIUM CHLORIDE 10 MEQ/50ML IV SOLN
10.0000 meq | INTRAVENOUS | Status: AC
  Administered 2021-01-27 (×2): 10 meq via INTRAVENOUS
  Filled 2021-01-27 (×2): qty 50

## 2021-01-27 MED ORDER — TRAVASOL 10 % IV SOLN
INTRAVENOUS | Status: AC
  Filled 2021-01-27: qty 1056

## 2021-01-27 NOTE — Progress Notes (Signed)
Subjective: 11 Days Post-Op Procedure(s) (LRB): TOTAL KNEE ARTHROPLASTY (Left) Patient reports pain as mild.   Patient seen in rounds for Dr. [name]. Patient is in the ICU with ileus. NG tube in place.  Patient reports minimal discomfort in his knee. He is resting in bed with nurse at the bedside.   Objective: Vital signs in last 24 hours: Temp:  [97.2 F (36.2 C)-100 F (37.8 C)] 98.8 F (37.1 C) (05/22 0716) Pulse Rate:  [87-133] 90 (05/22 0100) Resp:  [21-59] 25 (05/22 0100) BP: (110-174)/(34-141) 141/62 (05/22 0100) SpO2:  [80 %-99 %] 94 % (05/22 0100)  Intake/Output from previous day:  Intake/Output Summary (Last 24 hours) at 01/27/2021 0946 Last data filed at 01/27/2021 0659 Gross per 24 hour  Intake 3502.77 ml  Output 1400 ml  Net 2102.77 ml     Intake/Output this shift: No intake/output data recorded.  Labs: Recent Labs    01/25/21 2104 01/26/21 0427 01/26/21 1208 01/26/21 2300 01/27/21 0541  HGB 8.2* 7.5* 6.8* 7.8* 7.9*   Recent Labs    01/26/21 0427 01/26/21 1208 01/26/21 2300 01/27/21 0541  WBC 17.3*  --   --  17.4*  RBC 2.39*  --   --  2.65*  HCT 23.5*   < > 23.9* 25.4*  PLT 204  --   --  178   < > = values in this interval not displayed.   Recent Labs    01/26/21 0427 01/27/21 0509  NA 143 144  K 3.4* 3.9  CL 106 111  CO2 26 22  BUN 72* 83*  CREATININE 1.30* 1.29*  GLUCOSE 340* 333*  CALCIUM 8.5* 8.3*   No results for input(s): LABPT, INR in the last 72 hours.  Exam: General - Patient is Alert and Oriented Extremity - Neurologically intact Sensation intact distally Intact pulses distally Dorsiflexion/Plantar flexion intact Dressing - dressing C/D/I Motor Function - intact, moving foot and toes well on exam.   Past Medical History:  Diagnosis Date  . 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 Diabetes mellitus without complication ([place])   . GERD (gastroesophageal reflux disease)   . Hx of lymphoma   . Hyperlipidemia   .  Hypertension   . Hypotestosteronemia   . Sleep apnea 2013   wears CPAP    Assessment/Plan: 11 Days Post-Op Procedure(s) (LRB): TOTAL KNEE ARTHROPLASTY (Left) Principal Problem:   Ileus (HCC) Active Problems:   Type 2 diabetes mellitus without complication, without long-term current use of insulin (HCC)   Essential hypertension   Obesity (BMI 30-39.9)   CAD (coronary artery disease)   Pulmonary HTN (HCC)   S/P TKR (total knee replacement) using cement   Acute respiratory distress   AKI (acute kidney injury) ([place])   Acute renal failure (HCC)   Benign prostatic hyperplasia   Obstructive sleep apnea treated with continuous positive airway pressure (CPAP)   Hypotension   GERD (gastroesophageal reflux disease)   Protein-calorie malnutrition, moderate (HCC)   Gastrointestinal bleed   Atrial fibrillation (HCC)   Acute blood loss anemia   History of gastritis   Irritable bowel syndrome with intermittent diarrhea  Estimated body mass index is 36.57 kg/m as calculated from the following:   Height as of this encounter: 5\' 8"  (1.727 m).   Weight as of this encounter: 109.1 kg. Up with therapy  Weight bearing as tolerated.  Appreciate critical care team management  Will defer treatment of anemia to medical team Left total knee stable at this  point and gentle therapy will continue Available for assistance as needed, will continue to monitor   Griffith Citron, PA-C Orthopedic Surgery [phone] 01/27/2021, 9:46 AM

## 2021-01-27 NOTE — Progress Notes (Signed)
Cardiology Progress Note  Patient ID: [name] MRN: [id number] DOB: Jun 17, 1946 Date of Encounter: 01/27/2021  Primary Cardiologist: Ida Rogue, MD  Subjective   Chief Complaint: Abdominal pain  HPI: Maintaining sinus rhythm.  Now with small bowel obstruction.  N.p.o. with NG tube to suction.  General surgery following.  Hemoglobin has dropped with concerns for GI bleed.  ROS:  All other ROS reviewed and negative. Pertinent positives noted in the HPI.     Inpatient Medications  Scheduled Meds: . allopurinol  100 mg Per Tube QHS  . bisacodyl  10 mg Rectal Daily  . Chlorhexidine Gluconate Cloth  6 each Topical Daily  . insulin aspart  0-15 Units Subcutaneous Q4H  . insulin glargine  8 Units Subcutaneous BID  . lip balm  1 application Topical BID  . mouth rinse  15 mL Mouth Rinse BID  . metoCLOPramide (REGLAN) injection  10 mg Intravenous Q6H  . [START ON 01/30/2021] pantoprazole  40 mg Intravenous Q12H  . simethicone  80 mg Per Tube QID  . sodium chloride flush  10-40 mL Intracatheter Q12H   Continuous Infusions: . sodium chloride Stopped (01/24/21 1744)  . amiodarone 30 mg/hr (01/26/21 2232)  . lactated ringers    . ondansetron (ZOFRAN) IV    . pantoprozole (PROTONIX) infusion 8 mg/hr (01/27/21 0513)  . potassium chloride    . TPN ADULT (ION) 80 mL/hr at 01/27/21 0522   PRN Meds: acetaminophen, alum & mag hydroxide-simeth, diphenhydrAMINE, HYDROmorphone (DILAUDID) injection, lactated ringers, magic mouthwash, menthol-cetylpyridinium, metoprolol tartrate, naphazoline-glycerin, ondansetron (ZOFRAN) IV **OR** ondansetron (ZOFRAN) IV, phenol, sodium chloride, sodium chloride flush   Vital Signs   Vitals:   01/27/21 0000 01/27/21 0100 01/27/21 0400 01/27/21 0716  BP: (!) 151/60 (!) 141/62    Pulse: 90 90    Resp: (!) 32 (!) 25    Temp:   98.6 F (37 C) 98.8 F (37.1 C)  TempSrc:   Oral Oral  SpO2: 93% 94%    Weight:      Height:        Intake/Output Summary  (Last 24 hours) at 01/27/2021 0752 Last data filed at 01/27/2021 0659 Gross per 24 hour  Intake 3702.77 ml  Output 1400 ml  Net 2302.77 ml   Last 3 Weights 01/23/2021 01/20/2021 01/16/2021  Weight (lbs) 240 lb 8.4 oz 230 lb 2.6 oz 226 lb  Weight (kg) 109.1 kg 104.4 kg 102.513 kg      Telemetry  Overnight telemetry shows sinus rhythm in the 80s, which I personally reviewed.   ECG  The most recent ECG shows sinus rhythm heart rate 98, right bundle branch block, which I personally reviewed.   Physical Exam   Vitals:   01/27/21 0000 01/27/21 0100 01/27/21 0400 01/27/21 0716  BP: (!) 151/60 (!) 141/62    Pulse: 90 90    Resp: (!) 32 (!) 25    Temp:   98.6 F (37 C) 98.8 F (37.1 C)  TempSrc:   Oral Oral  SpO2: 93% 94%    Weight:      Height:         Intake/Output Summary (Last 24 hours) at 01/27/2021 0752 Last data filed at 01/27/2021 0659 Gross per 24 hour  Intake 3702.77 ml  Output 1400 ml  Net 2302.77 ml    Last 3 Weights 01/23/2021 01/20/2021 01/16/2021  Weight (lbs) 240 lb 8.4 oz 230 lb 2.6 oz 226 lb  Weight (kg) 109.1 kg 104.4 kg 102.513 kg  Body mass index is 36.57 kg/m.   General: Ill-appearing Head: Atraumatic, normal size  Eyes: PEERLA, EOMI  Neck: Supple, no JVD Endocrine: No thryomegaly Cardiac: Normal S1, S2; RRR; 2 out of 6 holosystolic murmur Lungs: Diminished breath sounds bilaterally Abd: Distended abdomen, tender to palpation Ext: No edema, pulses 2+ Musculoskeletal: No deformities, BUE and BLE strength normal and equal Skin: Warm and dry, no rashes   Neuro: Alert, oriented to person and place  Labs  High Sensitivity Troponin:   Recent Labs  Lab 01/20/21 1220 01/20/21 2131  TROPONINIHS 21* 23*     Cardiac EnzymesNo results for input(s): TROPONINI in the last 168 hours. No results for input(s): TROPIPOC in the last 168 hours.  Chemistry Recent Labs  Lab 01/20/21 1217 01/20/21 2131 01/24/21 0338 01/25/21 0525 01/26/21 0427 01/27/21 0509   NA 131*   < > 138 144 143 144  K 4.5   < > 3.0* 3.4* 3.4* 3.9  CL 94*   < > 102 108 106 111  CO2 24   < > 29 30 26 22   GLUCOSE 197*   < > 160* 208* 340* 333*  BUN 89*   < > 62* 65* 72* 83*  CREATININE 4.01*   < > 1.63* 1.30* 1.30* 1.29*  CALCIUM 9.2   < > 8.6* 8.5* 8.5* 8.3*  PROT 5.8*  --  5.6*  --   --   --   ALBUMIN 2.7*  --  2.7*  --   --   --   AST 40  --  17  --   --   --   ALT 36  --  18  --   --   --   ALKPHOS 53  --  42  --   --   --   BILITOT 2.0*  --  1.1  --   --   --   GFRNONAA 15*   < > 44* 57* 57* 58*  ANIONGAP 13   < > 7 6 11 11    < > = values in this interval not displayed.    Hematology Recent Labs  Lab 01/25/21 0525 01/25/21 1224 01/26/21 0427 01/26/21 1208 01/26/21 2300 01/27/21 0541  WBC 18.0*  --  17.3*  --   --  17.4*  RBC 2.96*  --  2.39*  --   --  2.65*  HGB 9.4*   < > 7.5* 6.8* 7.8* 7.9*  HCT 28.8*   < > 23.5* 21.3* 23.9* 25.4*  MCV 97.3  --  98.3  --   --  95.8  MCH 31.8  --  31.4  --   --  29.8  MCHC 32.6  --  31.9  --   --  31.1  RDW 15.3  --  15.8*  --   --  19.3*  PLT 189  --  204  --   --  178   < > = values in this interval not displayed.   BNP Recent Labs  Lab 01/20/21 1221  BNP 34.6    DDimer  Recent Labs  Lab 01/20/21 2131  DDIMER 3.12*     Radiology  CT HEAD WO CONTRAST  Result Date: 01/26/2021 CLINICAL DATA:  Nonspecific dizziness.  Vision changes for 5 days. EXAM: CT HEAD WITHOUT CONTRAST TECHNIQUE: Contiguous axial images were obtained from the base of the skull through the vertex without intravenous contrast. COMPARISON:  None. FINDINGS: Brain: Dental artifact causes streak through the skull base. No intracranial hemorrhage,  mass effect, or midline shift. No hydrocephalus. The basilar cisterns are patent. No evidence of territorial infarct or acute ischemia. No extra-axial or intracranial fluid collection. Vascular: Atherosclerosis of skullbase vasculature without hyperdense vessel or abnormal calcification. Skull: No  fracture or focal lesion. Sinuses/Orbits: Nasogastric tube in place.  No acute findings. Other: None. IMPRESSION: No acute intracranial abnormality. Electronically Signed   By: Keith Rake M.D.   On: 01/26/2021 19:20   DG CHEST PORT 1 VIEW  Result Date: 01/27/2021 CLINICAL DATA:  Shortness of breath. EXAM: PORTABLE CHEST 1 VIEW.  AP portable semi erect. COMPARISON:  Chest x-ray 01/24/2021, CT chest 01/26/2021 FINDINGS: Right PICC with tip overlying the right atrium. Enteric tube coursing below the hemidiaphragm with tip and side port collimated off view. The tube is noted to loop overlying the gastric lumen. The heart size and mediastinal contours are unchanged. Calcifications of the aorta. Left base streaky airspace opacity likely representing atelectasis. No pulmonary edema. Interval improvement/resolution of a non existing versus trace left pleural effusion. No right pleural effusion. No pneumothorax. No acute osseous abnormality. IMPRESSION: 1. Streaky airspace opacity at the left base with elevated left hemidiaphragm likely representing atelectasis. 2. Improved/possible complete resolution of a left pleural effusion. 3. Enteric tube courses below the hemidiaphragm with tip and side port collimated off view. Enteric tube is noted to be coiled overlying the gastric lumen. Consider retracting by 5 cm. Electronically Signed   By: Iven Finn M.D.   On: 01/27/2021 06:52   DG Abd 2 Views  Result Date: 01/26/2021 CLINICAL DATA:  Abdominal distension. EXAM: ABDOMEN - 2 VIEW COMPARISON:  01/25/2021 FINDINGS: An NG tube is again noted with tip overlying the mid stomach. Gaseous distension of colon and small bowel are not significantly changed. Oral contrast administered on 01/24/2021 appears to lie within the cecum. IMPRESSION: Unchanged gaseous distension of colon and small bowel. Oral contrast administered 2 days ago lies within the cecum. Electronically Signed   By: Margarette Canada M.D.   On: 01/26/2021  10:41   DG Abd Portable 1V-Small Bowel Obstruction Protocol-24 hr delay  Result Date: 01/25/2021 CLINICAL DATA:  A 24 hour delay film for small-bowel obstruction. EXAM: PORTABLE ABDOMEN - 1 VIEW COMPARISON:  Film from the previous day. FINDINGS: Gastric catheter is again noted coiled within the stomach. Persistent small bowel dilatation is noted although colonic gas is again seen. By history contrast was administered 24 hours previous and contrast is noted within the distal small bowel predominately within the right lower quadrant. No significant colonic contrast is noted. No free air is seen. IMPRESSION: Persistent and stable small bowel dilatation when compared with the prior exam. Previously administered contrast material lies in the distal aspect of the small bowel. Electronically Signed   By: Inez Catalina M.D.   On: 01/25/2021 15:19   CT CHEST ABDOMEN PELVIS WO CONTRAST  Result Date: 01/26/2021 CLINICAL DATA:  Abdominal distension.  Anemia. EXAM: CT CHEST, ABDOMEN AND PELVIS WITHOUT CONTRAST TECHNIQUE: Multidetector CT imaging of the chest, abdomen and pelvis was performed following the standard protocol without IV contrast. COMPARISON:  Recent chest and abdominal radiographs. Abdominopelvic CT 01/23/2021 FINDINGS: CT CHEST FINDINGS Cardiovascular: Right upper extremity PICC tip at the atrial caval junction. Aortic atherosclerosis and tortuosity without aneurysm. There is dilatation of the main pulmonary artery at 4.3 cm. Mild cardiomegaly. Coronary artery calcifications. No pericardial effusion. Mediastinum/Nodes: Small mediastinal lymph nodes are not enlarged by size criteria. Limited assessment for hilar adenopathy on this noncontrast exam. Nasogastric  tube in the esophagus, which is mildly patulous despite decompression. No suspicious thyroid nodule. Lungs/Pleura: Small bilateral pleural effusions. Associated basilar atelectasis. Additional compressive atelectasis in the left lower lobe related to  elevated hemidiaphragm. Motion artifact limits assessment of pulmonary parenchyma. Linear platelike atelectasis in the lingula. No septal thickening or findings of pulmonary edema. No pneumothorax. Musculoskeletal: Diffuse degenerative change throughout the thoracic spine. No acute osseous abnormalities are seen. No chest wall soft tissue abnormality. CT ABDOMEN PELVIS FINDINGS Hepatobiliary: Motion artifact through the liver and gallbladder limiting assessment. No focal hepatic abnormality allowing for motion and lack of IV contrast. Liver is prominent size spanning 20 cm cranial caudal. No calcified gallstones. Pancreas: Motion artifact through the pancreas. No obvious ductal dilatation or inflammation. Spleen: Upper normal in size spanning 13.1 cm. No focal abnormality allowing for motion and lack of IV contrast. Splenule inferiorly. Adrenals/Urinary Tract: No adrenal nodule. Motion artifact through the kidneys limits detailed assessment. There is no hydronephrosis. Bilateral renal cysts. More detailed assessment is limited. The urinary bladder is minimally distended. Mild bladder wall thickening. Stomach/Bowel: Enteric tube tip in the stomach. Stomach is decompressed. Small bowel loops are diffusely dilated measuring up to 3.9 cm, fluid and contrast filled. No obstruction, as enteric contrast reaches the colon. No evidence of small bowel inflammation. Appendix is not well seen on this current exam due to motion. Administered enteric contrast reaches the sigmoid colon. Sigmoid colonic diverticulosis without diverticulitis. Sigmoid colonic redundancy. Small volume of colonic stool. No evidence of colonic inflammation. Vascular/Lymphatic: Moderate aortic atherosclerosis. No aortic aneurysm. Motion limits assessment for adenopathy, no obvious enlarged lymph nodes in the abdomen or pelvis. Reproductive: Enlarged prostate gland spanning 5.4 cm. Other: Trace free fluid in the pelvis. Perihepatic ascites in prior exam  has resolved. Generalized subcutaneous and flank soft tissue edema. No free air or focal fluid collection. Musculoskeletal: Degenerative change throughout the lumbar spine and both hips. There are no acute or suspicious osseous abnormalities. IMPRESSION: 1. Small bilateral pleural effusions with adjacent basilar atelectasis. Additional compressive atelectasis in the left lower lobe related to elevated hemidiaphragm. 2. Diffusely dilated fluid and contrast filled small bowel loops, suggesting generalized ileus. No obstruction, as enteric contrast reaches the colon. 3. Colonic diverticulosis without diverticulitis. 4. Enlarged prostate gland. Mild bladder wall thickening may be due to chronic bladder outlet obstruction. 5. Enlarged main pulmonary artery suggesting pulmonary arterial hypertension. 6. Aortic atherosclerosis and coronary artery calcifications. 7. Generalized body wall edema. Trace free fluid/ascites in the pelvis. Aortic Atherosclerosis (ICD10-I70.0). Electronically Signed   By: Keith Rake M.D.   On: 01/26/2021 19:46    Cardiac Studies  TTE 01/21/2021 1. Poor acoustic windows limit study even with Definity. Overall LVEF  appears normal with no signficant wall motion abnormalities . Left  ventricular ejection fraction, by estimation, is 50 to 55%. The left  ventricle has low normal function. There is  mild left ventricular hypertrophy.  2. Right ventricular systolic function is moderately reduced. The right  ventricular size is mildly enlarged. There is moderately elevated  pulmonary artery systolic pressure.  3. Trivial mitral valve regurgitation.  4. The aortic valve was not well visualized. Aortic valve regurgitation  is not visualized. Mild to moderate aortic valve sclerosis/calcification  is present, without any evidence of aortic stenosis.  5. The inferior vena cava is dilated in size with <50% respiratory  variability, suggesting right atrial pressure of 15 mmHg.    Patient Profile  [name] is a 75 y.o. 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  Course has been complicated on [id number] by dehydration and hypotension.  He subsequently developed an ileus and SBO.  He developed respiratory failure on 01/20/2021 due to aspiration.  VQ scan negative for PE.  He developed A. fib with RVR at that time.  Cardiology was consulted on 01/26/2021 for A. fib management in the setting of GI bleed and acute blood loss anemia.  Assessment & Plan   1.  Persistent atrial fibrillation -Secondary to acute illness. -He is n.p.o. due to small bowel obstruction and ileus.  Continue amiodarone drip to maintain rhythm control. -Recent drop in hemoglobin.  Would recommend to hold heparin.  Gastroenterology and general surgery are following. -If he develops A. fib with RVR can pursue as needed metoprolol. -Hopefully amiodarone will keep him in sinus rhythm.  2.  Acute blood loss anemia -Presumed GI source.  No anticoagulation.  3.  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  He had a left heart catheterization that demonstrated nonobstructive CAD.  He also underwent right heart catheterization which showed Qp/Qs of 1.4.  He does have a dilated right ventricle.  Most recent echocardiogram difficult to assess that. -He is 75 years of age.  I doubt this will be an issue for him. -His most recent echocardiogram does show dilated right ventricle with dilated IVC. -At some point he will need diuresis.  Now is not the time given his acute blood loss anemia. -Would recommend with supportive care for now.  Chest x-ray is clear. -He likely will need a repeat echocardiogram at some point just to get a better look at things. -With his rising WBC  and concern for small bowel obstruction and ileus and now with possible GI bleed would recommend to watch him conservatively from a cardiovascular standpoint. -VQ scan negative.  No evidence of PE.  I do not think this explains his RV failure. -He did not have RV failure in 2018 from his PAPVR.  4.  Nonobstructive CAD -No significant CAD on left heart cath in 2018  For questions or updates, please contact [place] Please consult [email or web address] for contact info under   Time Spent with Patient: I have spent a total of 25 minutes with patient reviewing hospital notes, telemetry, EKGs, labs and examining the patient as well as establishing an assessment and plan that was discussed with the patient.  > 50% of time was spent in direct patient care.    Signed, Addison Naegeli. Audie Box, MD, [place]  01/27/2021 7:52 AM

## 2021-01-27 NOTE — Progress Notes (Signed)
PHARMACY - TOTAL PARENTERAL NUTRITION CONSULT NOTE   Indication: Prolonged ileus  Patient Measurements: Height: 5\' 8"  (172.7 cm) Weight: 109.1 kg (240 lb 8.4 oz) IBW/kg (Calculated) : 68.4 TPN AdjBW (KG): 77.4 Body mass index is 36.57 kg/m. 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 Post op course complicated by Afib, aspiration PNA and prolonged ileus. Pharmacy consulted to manage TPN for nutrition support.  Glucose / Insulin: Hx DM2 on only metformin PTA  TRH increased lantus 8 qday to 8 q12 5/22; on moderate SSI  Solucortef 100 mg bid dc'd 5/20. Last dose 5/20 @ 5 am  Serum CBG 333 but fingerstick 153  CBGs after TPN hung with 15 units insulin: 171, 155, 153  Required 18 units SSI yesterday Electrolytes: K 3.9  after repletion yesterday (59 meq), goal >=4; Mg 2.1; Phos 3.4 after 20 mM yesterday; CoCa 9.44; CO2 down to 22;  Max Cl in TPN Renal: Postop AKI w/ SCr peaking at 4 (baseline 1.4) still slightly elevated above BL; BUN elevated 83; bicarb WNL; UOP 700/24  Hepatic: LFTs stable WNL; mild bump in tbili resolved; Trig 98 (5/10) Preabumin 10 5/19 I/O:  MIVF: NS 10 ml/hr  I/O +2403 mls  Relistor x 2 doses; scheduled IV reglan q6h & miralax bid  gastrograffin x 1 5/19 for SBO protocol, tap water enemas   NG output 700 ml/24 hrs;   2 stools recorded 5/20 & 5/21  UOP 700 ml/24 hrs GI Imaging:  - 5/18 CT A/P: ileus vs distal SBO 5/19 KUB: diffuse gaseous bowel distention persists 5/20 KUB: persistent & stable SBO 5/21 KUB: unchanged gaseous distention of colon & SB, contrast from 2 days ago in cecum 5/22 KUB: ordered GI Surgeries / Procedures: none  Central access: CVC; PICC placed 5/19 for TPN TPN start date: 01/23/21  Nutritional Goals (RD recommendations 5/18): Kcal:  1880-2090 kcal Protein:  90-100 grams Fluid:  >/= 2 L/day  Current TPN at goal rate of 80 mL/hr provides 106 g of protein, 1978 kcals per day  Keep K  >/= 4 and Mg >/=2 with ileus  Current Nutrition:  NPO, TPN  Plan:   Now: 2 K runs   continue TPN @ 32mL/hr at 1800- provides 100% of goals  Electrolytes in TPN:  Na - 10mEq/L  K -65 mEq/L  Ca - 31mEq/L  Mg - 5 mEq/L  Phos - 23mmol/L  Cl:Ac ratio: change to 1:1  Add standard MVI and trace elements to TPN  Continue moderate scale SSI q4h and adjust as needed  continue insulin 15 units in TPN   Continue Lantus 8 units q12 per TRH  MIVF NS at [facility] per MD  Monitor TPN labs on Mon/Thurs  Eudelia Bunch, Pharm.D 01/27/2021 7:40 AM

## 2021-01-27 NOTE — Progress Notes (Signed)
Pt got NG tube so CPAP is on hold. Once NG tube is removed we can start the CPAP. RN aware.

## 2021-01-27 NOTE — Progress Notes (Signed)
PROGRESS NOTE  Guhan Chizmar [id number] DOB: Mar 19, 1946 DOA: 01/16/2021 PCP: Bing Neighbors, NP  HPI/Recap of past 13 hours: 75 year old man with hx of SBO, hypertension, hyperlipidemia, GERD, type 2 diabetes, presenting for TKR complicated by ileus, LLL PNA, new onset perioperative A. fib with RVR, noted on 01/20/2021 and started on amiodarone drip and heparin drip in the ICU.  He had acute hypoxic respiratory failure and was transferred to the ICU on 01/20/2021 and was seen by PCCM.  TRH, hospitalist team was initially consulted for hypotension requiring vasopressors, acute kidney injury with a creatinine of 4 and constipation with work-up of ileus versus SBO.  NG tube placed on 01/20/2021.  On 01/25/2021 NGT output was noted to be coffee-ground for which GI was consulted.  Was also noted to have acute blood loss anemia.  On 01/26/2021 he was noted to have symptomatic anemia 2 unit PRBC were ordered to be transfused.  Heparin drip held.  Discussed with cardiology Dr. Rayann Heman through curbside.  We will continue to hold off on heparin drip until the source of bleeding is controlled.    01/27/21: Patient was seen and examined at his bedside.  He was somnolent but easily arousable to voices.  When asked if he is hurting anywhere he states he does not hurt anywhere.  He had bowel movements overnight.  NG tube is in place.  He denies having any nausea.  Tolerating clear liquid diet per night bedside RN.  Likely improving ileus.    Assessment/Plan: Principal Problem:   Ileus ([place]) Active Problems:   Type 2 diabetes mellitus without complication, without long-term current use of insulin (HCC)   Essential hypertension   Obesity (BMI 30-39.9)   CAD (coronary artery disease)   Pulmonary HTN (HCC)   S/P TKR (total knee replacement) using cement   Acute respiratory distress   AKI (acute kidney injury) ([place])   Acute renal failure (HCC)   Benign prostatic hyperplasia   Obstructive sleep apnea treated  with continuous positive airway pressure (CPAP)   Hypotension   GERD (gastroesophageal reflux disease)   Protein-calorie malnutrition, moderate (HCC)   Gastrointestinal bleed   Atrial fibrillation (HCC)   Acute blood loss anemia   History of gastritis   Irritable bowel syndrome with intermittent diarrhea  Status post total left knee replacement 01/16/21 by Dr. [name] POD #11 Management per primary, orthopedic surgery.  Ileus, partial small bowel obstruction History of small bowel obstruction treated medically at outside facility in [place] in 2019. NG tube has been clamped and he has tolerated a clear liquid diet per Night bedside RN Bowel movements overnight Protonix drip in place by general surgery in the setting of upper GI bleed with prior history of gastritis seen on EGD done on 06/18/2018. NG tube is still in place until ileus has resolved completely Follow abdominal x-ray. Replete electrolytes as indicated Goal magnesium greater than 2.0, goal potassium greater than 4.0. Mobilize as tolerated with PT OT alternating daily. Continue Dulcolax 10 mg daily Continue simethicone 80 mg 4 times daily. Appreciate general surgery consultative services.  Acute blood loss anemia secondary to upper GI bleed. History of gastritis. Gastric content sent on 01/25/2021 positive for occult blood. Drop of hemoglobin 6.8 on 01/26/2021, transfused 2 unit PRBCs. Repeat hemoglobin 7.9 on 01/27/2021. Received Protonix bolus on 01/26/2021.  Currently on Protonix drip. Hemoglobin 15.1 on 01/07/2021. GI following.  New onset perioperative A. fib with RVR CHA2DS2-VASc of 4 Was noted on 01/20/2021: Patient was transferred  to the ICU Was started on amiodarone drip and heparin drip on 01/20/2021 in the ICU. Currently on amiodarone drip and IV Lopressor for rate control Heparin drip has been DC'd on 01/26/2021 due to upper GI bleed. 2D echo was done on 01/21/2021 left atrium normal in size.  EF  50-55%. 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 Continue to avoid nephrotoxic agents Continue to avoid dehydration from GI losses Monitor urine output Repeat chemistry panel in the morning  Resolved acute hypoxic respiratory failure secondary to left lower lobe pneumonia Completed IV antibiotics, course on Rocephin x5 days on 01/25/2021.   No longer requiring oxygen supplementation, 95% on room air. Personally reviewed chest x-ray done on 01/27/2021 which shows improvement of left lower lobe infiltrates. Continue to mobilize as tolerated  OSA on CPAP Avoid use of CPAP and BiPAP at this time with NG tube in place with low intermittent suction Continue to closely monitor  Essential hypertension DC Solu-Cortef, stress dose steroids on 01/25/2021 IV Lopressor as needed with parameters. Continue to monitor vital signs  Resolved post repletion: Hypokalemia Potassium 3.4>> 3.9. Repleted with TPN  Prediabetes with hyperglycemia likely contributed by IV steroid which was discontinued on 01/25/2021. Hemoglobin A1c 5.7 on 01/16/2021 Insulin sliding scale if needed Avoid hypoglycemia  Electrolyte derangement on TPN Hypokalemia, repleted with TPN Hyperglycemia, dose adjustment by pharmacy Hypophosphatemia, repleted with TPN Appreciate pharmacy's assistance  Elevated troponin Peaked at 23 No reported anginal symptoms. Heparin drip DC'd on 01/26/2021 due to upper GI bleed.  GERD Protonix drip.  Received Protonix bolus.  Gout    Code Status: Full code.  Family Communication: Updated his wife and son at bedside.  Disposition Plan: Defer to primary team.   Thank you for allowing [place] to participate in the care of this patient.  We will continue to follow along with you.     Objective: Vitals:   01/27/21 0000 01/27/21 0100 01/27/21 0400 01/27/21 0716  BP: (!) 151/60 (!) 141/62    Pulse: 90 90     Resp: (!) 32 (!) 25    Temp:   98.6 F (37 C) 98.8 F (37.1 C)  TempSrc:   Oral Oral  SpO2: 93% 94%    Weight:      Height:        Intake/Output Summary (Last 24 hours) at 01/27/2021 0829 Last data filed at 01/27/2021 6962 Gross per 24 hour  Intake 3702.77 ml  Output 1400 ml  Net 2302.77 ml   Filed Weights   01/16/21 0650 01/20/21 1300 01/23/21 1122  Weight: 102.5 kg 104.4 kg 109.1 kg    Exam:  . General: 75 y.o. year-old male weak appearing in no acute distress.  NG tube in place.  He is alert and oriented x3.   . Cardiovascular: Regular rate and rhythm no rubs or gallops. Marland Kitchen Respiratory: Mild rales at bases no wheezing noted.  Poor inspiratory effort.   . Abdomen: Obese distended hypoactive bowel sounds present.  Nontender.   . Musculoskeletal: Surgical dressing to left knee.  Left lower extremity edema noted.   . Skin: No ulcers or lesions noted. Marland Kitchen Psychiatry: Mood is appropriate for condition and setting.   Data Reviewed: CBC: Recent Labs  Lab 01/20/21 1220 01/20/21 1730 01/21/21 0330 01/22/21 0330 01/23/21 0454 01/24/21 9528 01/25/21 0525 01/25/21 1224 01/25/21 2104 01/26/21 0427 01/26/21 1208 01/26/21 2300 01/27/21 0541  WBC 7.4   < >  6.1   < > 8.5 13.7* 18.0*  --   --  17.3*  --   --  17.4*  NEUTROABS 5.2  --  4.6  --   --  9.2*  --   --   --   --   --   --   --   HGB 13.4   < > 10.8*   < > 10.0* 10.5* 9.4*   < > 8.2* 7.5* 6.8* 7.8* 7.9*  HCT 39.1   < > 31.6*   < > 29.9* 31.5* 28.8*   < > 25.1* 23.5* 21.3* 23.9* 25.4*  MCV 94.2   < > 94.3   < > 93.7 96.0 97.3  --   --  98.3  --   --  95.8  PLT 223   < > 159   < > 156 176 189  --   --  204  --   --  178   < > = values in this interval not displayed.   Basic Metabolic Panel: Recent Labs  Lab 01/23/21 0730 01/23/21 1410 01/24/21 0338 01/25/21 0525 01/26/21 0427 01/27/21 0509  NA 137 139 138 144 143 144  K 3.1* 3.5 3.0* 3.4* 3.4* 3.9  CL 98 99 102 108 106 111  CO2 27 26 29 30 26 22   GLUCOSE  121* 125* 160* 208* 340* 333*  BUN 73* 66* 62* 65* 72* 83*  CREATININE 1.95* 1.86* 1.63* 1.30* 1.30* 1.29*  CALCIUM 8.7* 8.9 8.6* 8.5* 8.5* 8.3*  MG 3.9*  --  3.1* 2.6* 2.0 2.1  PHOS 3.0  --  2.5 1.9* 2.4* 3.4   GFR: Estimated Creatinine Clearance: 59.3 mL/min (A) (by C-G formula based on SCr of 1.29 mg/dL (H)). Liver Function Tests: Recent Labs  Lab 01/20/21 1217 01/24/21 0338  AST 40 17  ALT 36 18  ALKPHOS 53 42  BILITOT 2.0* 1.1  PROT 5.8* 5.6*  ALBUMIN 2.7* 2.7*   No results for input(s): LIPASE, AMYLASE in the last 168 hours. No results for input(s): AMMONIA in the last 168 hours. Coagulation Profile: Recent Labs  Lab 01/21/21 0330  INR 1.2   Cardiac Enzymes: No results for input(s): CKTOTAL, CKMB, CKMBINDEX, TROPONINI in the last 168 hours. BNP (last 3 results) No results for input(s): PROBNP in the last 8760 hours. HbA1C: No results for input(s): HGBA1C in the last 72 hours. CBG: Recent Labs  Lab 01/26/21 1614 01/26/21 1933 01/26/21 2315 01/27/21 0325 01/27/21 0749  GLUCAP 195* 171* 166* 153* 176*   Lipid Profile: No results for input(s): CHOL, HDL, LDLCALC, TRIG, CHOLHDL, LDLDIRECT in the last 72 hours. Thyroid Function Tests: No results for input(s): TSH, T4TOTAL, FREET4, T3FREE, THYROIDAB in the last 72 hours. Anemia Panel: No results for input(s): VITAMINB12, FOLATE, FERRITIN, TIBC, IRON, RETICCTPCT in the last 72 hours. Urine analysis:    Component Value Date/Time   COLORURINE AMBER (A) 01/19/2021 1622   APPEARANCEUR CLOUDY (A) 01/19/2021 1622   LABSPEC 1.018 01/19/2021 1622   PHURINE 5.0 01/19/2021 1622   GLUCOSEU NEGATIVE 01/19/2021 1622   HGBUR MODERATE (A) 01/19/2021 1622   BILIRUBINUR NEGATIVE 01/19/2021 1622   KETONESUR NEGATIVE 01/19/2021 1622   PROTEINUR 100 (A) 01/19/2021 1622   NITRITE NEGATIVE 01/19/2021 1622   LEUKOCYTESUR NEGATIVE 01/19/2021 1622   Sepsis Labs: @LABRCNTIP (procalcitonin:4,lacticidven:4)  )No results found  for this or any previous visit (from the past 240 hour(s)).    Studies: CT HEAD WO CONTRAST  Result Date: 01/26/2021 CLINICAL DATA:  Nonspecific  dizziness.  Vision changes for 5 days. EXAM: CT HEAD WITHOUT CONTRAST TECHNIQUE: Contiguous axial images were obtained from the base of the skull through the vertex without intravenous contrast. COMPARISON:  None. FINDINGS: Brain: Dental artifact causes streak through the skull base. No intracranial hemorrhage, mass effect, or midline shift. No hydrocephalus. The basilar cisterns are patent. No evidence of territorial infarct or acute ischemia. No extra-axial or intracranial fluid collection. Vascular: Atherosclerosis of skullbase vasculature without hyperdense vessel or abnormal calcification. Skull: No fracture or focal lesion. Sinuses/Orbits: Nasogastric tube in place.  No acute findings. Other: None. IMPRESSION: No acute intracranial abnormality. Electronically Signed   By: Keith Rake M.D.   On: 01/26/2021 19:20   DG CHEST PORT 1 VIEW  Result Date: 01/27/2021 CLINICAL DATA:  Shortness of breath. EXAM: PORTABLE CHEST 1 VIEW.  AP portable semi erect. COMPARISON:  Chest x-ray 01/24/2021, CT chest 01/26/2021 FINDINGS: Right PICC with tip overlying the right atrium. Enteric tube coursing below the hemidiaphragm with tip and side port collimated off view. The tube is noted to loop overlying the gastric lumen. The heart size and mediastinal contours are unchanged. Calcifications of the aorta. Left base streaky airspace opacity likely representing atelectasis. No pulmonary edema. Interval improvement/resolution of a non existing versus trace left pleural effusion. No right pleural effusion. No pneumothorax. No acute osseous abnormality. IMPRESSION: 1. Streaky airspace opacity at the left base with elevated left hemidiaphragm likely representing atelectasis. 2. Improved/possible complete resolution of a left pleural effusion. 3. Enteric tube courses below the  hemidiaphragm with tip and side port collimated off view. Enteric tube is noted to be coiled overlying the gastric lumen. Consider retracting by 5 cm. Electronically Signed   By: Iven Finn M.D.   On: 01/27/2021 06:52   DG Abd 2 Views  Result Date: 01/26/2021 CLINICAL DATA:  Abdominal distension. EXAM: ABDOMEN - 2 VIEW COMPARISON:  01/25/2021 FINDINGS: An NG tube is again noted with tip overlying the mid stomach. Gaseous distension of colon and small bowel are not significantly changed. Oral contrast administered on 01/24/2021 appears to lie within the cecum. IMPRESSION: Unchanged gaseous distension of colon and small bowel. Oral contrast administered 2 days ago lies within the cecum. Electronically Signed   By: Margarette Canada M.D.   On: 01/26/2021 10:41   CT CHEST ABDOMEN PELVIS WO CONTRAST  Result Date: 01/26/2021 CLINICAL DATA:  Abdominal distension.  Anemia. EXAM: CT CHEST, ABDOMEN AND PELVIS WITHOUT CONTRAST TECHNIQUE: Multidetector CT imaging of the chest, abdomen and pelvis was performed following the standard protocol without IV contrast. COMPARISON:  Recent chest and abdominal radiographs. Abdominopelvic CT 01/23/2021 FINDINGS: CT CHEST FINDINGS Cardiovascular: Right upper extremity PICC tip at the atrial caval junction. Aortic atherosclerosis and tortuosity without aneurysm. There is dilatation of the main pulmonary artery at 4.3 cm. Mild cardiomegaly. Coronary artery calcifications. No pericardial effusion. Mediastinum/Nodes: Small mediastinal lymph nodes are not enlarged by size criteria. Limited assessment for hilar adenopathy on this noncontrast exam. Nasogastric tube in the esophagus, which is mildly patulous despite decompression. No suspicious thyroid nodule. Lungs/Pleura: Small bilateral pleural effusions. Associated basilar atelectasis. Additional compressive atelectasis in the left lower lobe related to elevated hemidiaphragm. Motion artifact limits assessment of pulmonary parenchyma.  Linear platelike atelectasis in the lingula. No septal thickening or findings of pulmonary edema. No pneumothorax. Musculoskeletal: Diffuse degenerative change throughout the thoracic spine. No acute osseous abnormalities are seen. No chest wall soft tissue abnormality. CT ABDOMEN PELVIS FINDINGS Hepatobiliary: Motion artifact through the liver and gallbladder  limiting assessment. No focal hepatic abnormality allowing for motion and lack of IV contrast. Liver is prominent size spanning 20 cm cranial caudal. No calcified gallstones. Pancreas: Motion artifact through the pancreas. No obvious ductal dilatation or inflammation. Spleen: Upper normal in size spanning 13.1 cm. No focal abnormality allowing for motion and lack of IV contrast. Splenule inferiorly. Adrenals/Urinary Tract: No adrenal nodule. Motion artifact through the kidneys limits detailed assessment. There is no hydronephrosis. Bilateral renal cysts. More detailed assessment is limited. The urinary bladder is minimally distended. Mild bladder wall thickening. Stomach/Bowel: Enteric tube tip in the stomach. Stomach is decompressed. Small bowel loops are diffusely dilated measuring up to 3.9 cm, fluid and contrast filled. No obstruction, as enteric contrast reaches the colon. No evidence of small bowel inflammation. Appendix is not well seen on this current exam due to motion. Administered enteric contrast reaches the sigmoid colon. Sigmoid colonic diverticulosis without diverticulitis. Sigmoid colonic redundancy. Small volume of colonic stool. No evidence of colonic inflammation. Vascular/Lymphatic: Moderate aortic atherosclerosis. No aortic aneurysm. Motion limits assessment for adenopathy, no obvious enlarged lymph nodes in the abdomen or pelvis. Reproductive: Enlarged prostate gland spanning 5.4 cm. Other: Trace free fluid in the pelvis. Perihepatic ascites in prior exam has resolved. Generalized subcutaneous and flank soft tissue edema. No free air or  focal fluid collection. Musculoskeletal: Degenerative change throughout the lumbar spine and both hips. There are no acute or suspicious osseous abnormalities. IMPRESSION: 1. Small bilateral pleural effusions with adjacent basilar atelectasis. Additional compressive atelectasis in the left lower lobe related to elevated hemidiaphragm. 2. Diffusely dilated fluid and contrast filled small bowel loops, suggesting generalized ileus. No obstruction, as enteric contrast reaches the colon. 3. Colonic diverticulosis without diverticulitis. 4. Enlarged prostate gland. Mild bladder wall thickening may be due to chronic bladder outlet obstruction. 5. Enlarged main pulmonary artery suggesting pulmonary arterial hypertension. 6. Aortic atherosclerosis and coronary artery calcifications. 7. Generalized body wall edema. Trace free fluid/ascites in the pelvis. Aortic Atherosclerosis (ICD10-I70.0). Electronically Signed   By: Keith Rake M.D.   On: 01/26/2021 19:46    Scheduled Meds: . allopurinol  100 mg Per Tube QHS  . bisacodyl  10 mg Rectal Daily  . Chlorhexidine Gluconate Cloth  6 each Topical Daily  . insulin aspart  0-15 Units Subcutaneous Q4H  . insulin glargine  8 Units Subcutaneous BID  . lip balm  1 application Topical BID  . mouth rinse  15 mL Mouth Rinse BID  . metoCLOPramide (REGLAN) injection  10 mg Intravenous Q6H  . [START ON 01/30/2021] pantoprazole  40 mg Intravenous Q12H  . simethicone  80 mg Per Tube QID  . sodium chloride flush  10-40 mL Intracatheter Q12H    Continuous Infusions: . sodium chloride Stopped (01/24/21 1744)  . amiodarone 30 mg/hr (01/26/21 2232)  . lactated ringers    . ondansetron (ZOFRAN) IV    . pantoprozole (PROTONIX) infusion 8 mg/hr (01/27/21 0513)  . potassium chloride    . TPN ADULT (ION) 80 mL/hr at 01/27/21 0522     LOS: 11 days     Kayleen Memos, MD [facility] Pager [phone]  If 7PM-7AM, please contact  night-coverage [email or web address] Password [facility] 01/27/2021, 8:29 AM

## 2021-01-27 NOTE — Consult Note (Signed)
Demarea Mahony  [birth year] [id number]  CARE TEAM:  PCP: Bing Neighbors, NP  Outpatient Care Team: Patient Care Team: Bing Neighbors, NP as PCP - General (Family Medicine) Rockey Situ Kathlene November, MD as PCP - Cardiology (Cardiology) Rockey Situ Kathlene November, MD as Consulting Physician (Cardiology) Corey Harold, MD as Referring Physician (Hematology and Oncology) Milus Banister, MD as Attending Physician (Gastroenterology) Susa Day, MD as Consulting Physician (Orthopedic Surgery)  Inpatient Treatment Team: Treatment Team: Attending Provider: Susa Day, MD; Consulting Physician: Gatha Mayer, MD; Rounding Team: Redmond Baseman, MD; Rounding Team: Lbcardiology, Michae Kava, MD; Consulting Physician: Nolon Nations, MD; Physical Therapist: Junius Argyle, PT; Utilization Review: Claudie Leach, RN; Pharmacist: Eudelia Bunch, [facility]; Registered Nurse: Quintella Baton, RN; Social Worker: Lowella Curb, LCSW   This patient is a 75 y.o.male who presents today for surgical evaluation at the request of Irene Pap, MD.   Chief complaint / Reason for evaluation: Persistent abdominal distention.  Ileus versus bowel obstruction.  Obese male with multiple medical problems with history of IBS and prior gastritis followed by [facility] gastroenterology.  Sounds like had an episode of ileus versus partial obstruction resolved nonoperatively when he was up in [place], [place].  I could not find these records through Care Everywhere  Concern for ileus versus small bowel obstruction after knee surgery 10 days ago.  Has had many postoperative issues including respiratory failure ileus and decline.  In reviewing the initial CT scan 5/15, the small bowel diffusely dilated and there is gas in the colon.  This looks more like an ileus picture than true obstruction.  Small bowel protocol started , 5/19.  Delayed film today shows contrast in cecum, arguing against true obstruction at this time.  Patient did  have bowel movement today but is distended.  Sounds like he had prior enema.  Patient on scheduled Reglan for presumed ileus etiology - defer to GI  ICU nursing and wife at bedside.  Patient still feels rather [place] but no nausea or belching.  Had bowel movement and feeling a little better.  Has limited ability with numerous health and cardiac issues.  No hematemesis or hematochezia.  No melena.      Assessment  [name]  75 y.o. male  11 Days Post-Op  Procedure(s): TOTAL KNEE ARTHROPLASTY  Problem List:  Principal Problem:   Ileus ([place]) Active Problems:   Gastrointestinal bleed   Type 2 diabetes mellitus without complication, without long-term current use of insulin (HCC)   Essential hypertension   Obesity (BMI 30-39.9)   CAD (coronary artery disease)   Pulmonary HTN (HCC)   S/P TKR (total knee replacement) using cement   Acute respiratory distress   AKI (acute kidney injury) ([place])   Acute renal failure (HCC)   Benign prostatic hyperplasia   Obstructive sleep apnea treated with continuous positive airway pressure (CPAP)   Hypotension   GERD (gastroesophageal reflux disease)   Protein-calorie malnutrition, moderate (HCC)   Atrial fibrillation (HCC)   Acute blood loss anemia   History of gastritis   Irritable bowel syndrome with intermittent diarrhea   Probable ileus after orthopedic surgery in this patient with multiple medical problems.  Appears to be gradually resolving.  Plan:  Retry clamping trial started last night.  Had some residual that is bilious but it just thinned.  Check residuals every 6 hours.  If does well consider removing nasogastric tube advancing diet.  If worse then consider repeat CAT scan to rule out something  else.  Repeat plain films today.  Trying to get up and mobilize as tolerated.  A challenge in this medically fragile patient.  Concern for upper GI bleeding gastritis.  Defer to gastroenterology.  Plan switch to more continuous PPIs  for now in the hopes of stabilizing hemoglobin in the setting of chronic need for anticoagulation  -VTE prophylaxis- SCDs, etc  -mobilize as tolerated to help recovery  35 minutes spent in review, evaluation, examination, counseling, and coordination of care.  More than 50% of that time was spent in counseling.  Adin Hector, MD, FACS, MASCRS  Esophageal, Gastrointestinal & Colorectal Surgery Robotic and Minimally Invasive Surgery [facility] [address], [place], [place] [address] [phone] Fax [phone] Main/Paging  CONTACT INFORMATION: Weekday (9AM-5PM) concerns: Call [facility] main office at [phone] Weeknight (5PM-9AM) or Weekend/Holiday concerns: Check [email or web address] for General Surgery [facility] coverage (Please, do not use SecureChat as it is not reliable communication to operating surgeons for immediate patient care)      01/27/2021      Past Medical History:  Diagnosis Date  . 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 Diabetes mellitus without complication ([place])   . GERD (gastroesophageal reflux disease)   . Hx of lymphoma   . Hyperlipidemia   . Hypertension   . Hypotestosteronemia   . Sleep apnea 2013   wears CPAP    Past Surgical History:  Procedure Laterality Date  . CARDIAC CATHETERIZATION     Dr. Sabra Heck  . RIGHT/LEFT HEART CATH AND CORONARY ANGIOGRAPHY N/A 01/02/2017   Procedure: Right/Left Heart Cath and Coronary Angiography;  Surgeon: Jolaine Artist, MD;  Location: [facility];  Service: Cardiovascular;  Laterality: N/A;  . TOTAL KNEE ARTHROPLASTY Left 01/16/2021   Procedure: TOTAL KNEE ARTHROPLASTY;  Surgeon: Susa Day, MD;  Location: [facility];  Service: Orthopedics;  Laterality: Left;  2.5 hrs General vs Spinal    Social History   Socioeconomic History  . Marital status: Married    Spouse name: Not on file  . Number of children: Not on file  . Years of education: Not on file  . Highest education  level: Not on file  Occupational History  . Not on file  Tobacco Use  . Smoking status: Never Smoker  . Smokeless tobacco: Never Used  Vaping Use  . Vaping Use: Never used  Substance and Sexual Activity  . Alcohol use: No  . Drug use: No  . Sexual activity: Not on file  Other Topics Concern  . Not on file  Social History Narrative  . Not on file   Social Determinants of Health   Financial Resource Strain: Not on file  Food Insecurity: Not on file  Transportation Needs: Not on file  Physical Activity: Not on file  Stress: Not on file  Social Connections: Not on file  Intimate Partner Violence: Not on file    Family History  Problem Relation Age of Onset  . Heart disease Father   . Heart attack Father        x3  . Colon cancer Neg Hx   . Rectal cancer Neg Hx   . Stomach cancer Neg Hx   . Esophageal cancer Neg Hx     Current Facility-Administered Medications  Medication Dose Route Frequency Provider Last Rate Last Admin  . 0.9 %  sodium chloride infusion   Intravenous Continuous Polly Cobia, RPH   Paused at 01/24/21 1744  . acetaminophen (  TYLENOL) suppository 650 mg  650 mg Rectal Q6H PRN Michael Boston, MD      . allopurinol (ZYLOPRIM) tablet 100 mg  100 mg Per Tube QHS Susa Day, MD   100 mg at 01/26/21 2234  . alum & mag hydroxide-simeth (MAALOX/MYLANTA) 200-200-20 MG/5ML suspension 30 mL  30 mL Oral Q6H PRN Michael Boston, MD      . amiodarone (NEXTERONE PREMIX) 360-4.14 MG/200ML-% (1.8 mg/mL) IV infusion  30 mg/hr Intravenous Continuous Cecilie Lowers T, MD 16.67 mL/hr at 01/26/21 2232 30 mg/hr at 01/26/21 2232  . bisacodyl (DULCOLAX) suppository 10 mg  10 mg Rectal Daily Michael Boston, MD   10 mg at 01/26/21 1838  . Chlorhexidine Gluconate Cloth 2 % PADS 6 each  6 each Topical Daily Susa Day, MD   6 each at 01/26/21 1100  . diphenhydrAMINE (BENADRYL) injection 12.5-25 mg  12.5-25 mg Intravenous Q6H PRN Michael Boston, MD      . HYDROmorphone (DILAUDID)  injection 0.5-1 mg  0.5-1 mg Intravenous Q4H PRN Susa Day, MD   1 mg at 01/17/21 1947  . insulin aspart (novoLOG) injection 0-15 Units  0-15 Units Subcutaneous Q4H Elmer Sow, MD   3 Units at 01/27/21 [phone]  . insulin glargine (LANTUS) injection 8 Units  8 Units Subcutaneous BID Kayleen Memos, DO   8 Units at 01/27/21 0737  . lactated ringers bolus 1,000 mL  1,000 mL Intravenous Q8H PRN Michael Boston, MD      . lip balm (CARMEX) ointment 1 application  1 application Topical BID Michael Boston, MD   1 application at 10/62/69 [phone]  . magic mouthwash  15 mL Oral QID PRN Michael Boston, MD   15 mL at 01/26/21 1838  . MEDLINE mouth rinse  15 mL Mouth Rinse BID Susa Day, MD   15 mL at 01/26/21 2349  . menthol-cetylpyridinium (CEPACOL) lozenge 3 mg  1 lozenge Oral PRN Michael Boston, MD      . metoCLOPramide (REGLAN) injection 10 mg  10 mg Intravenous Q6H Candee Furbish, MD   10 mg at 01/27/21 0518  . metoprolol tartrate (LOPRESSOR) injection 2.5-5 mg  2.5-5 mg Intravenous Q3H PRN Andres Labrum D, PA-C   5 mg at 01/25/21 6270  . naphazoline-glycerin (CLEAR EYES REDNESS) ophth solution 1-2 drop  1-2 drop Both Eyes QID PRN Michael Boston, MD      . ondansetron [facility]) injection 4 mg  4 mg Intravenous Q6H PRN Michael Boston, MD       Or  . ondansetron (ZOFRAN) 8 mg in sodium chloride 0.9 % 50 mL IVPB  8 mg Intravenous Q6H PRN Michael Boston, MD      . pantoprazole (PROTONIX) 80 mg in sodium chloride 0.9 % 100 mL (0.8 mg/mL) infusion  8 mg/hr Intravenous Continuous Michael Boston, MD 10 mL/hr at 01/27/21 0513 8 mg/hr at 01/27/21 0513  . [START ON 01/30/2021] pantoprazole (PROTONIX) injection 40 mg  40 mg Intravenous Gorden Harms, MD      . phenol (CHLORASEPTIC) mouth spray 2 spray  2 spray Mouth/Throat PRN Michael Boston, MD      . potassium chloride 10 mEq in 50 mL *CENTRAL LINE* IVPB  10 mEq Intravenous Q1 Hr x 2 Eudelia Bunch, RPH   Stopped at 01/27/21 1014  . simethicone (MYLICON) 40  JJ/0.0XF suspension 80 mg  80 mg Per Tube QID Michael Boston, MD   80 mg at 01/27/21 0912  . sodium chloride (OCEAN) 0.65 % nasal spray  1-2 spray  1-2 spray Each Nare Q6H PRN Michael Boston, MD      . sodium chloride flush (NS) 0.9 % injection 10-40 mL  10-40 mL Intracatheter Q12H Susa Day, MD   30 mL at 01/27/21 0904  . sodium chloride flush (NS) 0.9 % injection 10-40 mL  10-40 mL Intracatheter PRN Susa Day, MD   10 mL at 01/25/21 1814  . TPN ADULT (ION)   Intravenous Continuous TPN Eudelia Bunch, RPH 80 mL/hr at 01/27/21 0522 Infusion Verify at 01/27/21 0522  . TPN ADULT (ION)   Intravenous Continuous TPN Leodis Sias T, RPH         Allergies  Allergen Reactions  . Penicillins Hives and Other (See Comments)    "cillins" Has patient had a PCN reaction causing immediate rash, facial/tongue/throat swelling, SOB or lightheadedness with hypotension: unknown Has patient had a PCN reaction causing severe rash involving mucus membranes or skin necrosis: unknown Has patient had a PCN reaction that required hospitalization no Has patient had a PCN reaction occurring within the last 10 years:no If all of the above answers are "NO", then may proceed with Cephalosporin use.   . Nsaids Other (See Comments)    Gastritis & Upper GI bleed    ROS:   All other systems reviewed & are negative except per HPI or as noted below: Constitutional:  No fevers, chills, sweats.  Weight stable Eyes:  No vision changes, No discharge HENT:  No sore throats, nasal drainage Lymph: No neck swelling, No bruising easily Pulmonary:  No cough, productive sputum CV: No orthopnea, PND.   No exertional chest/neck/shoulder/arm pain. GI:  No personal nor family history of GI/colon cancer, inflammatory bowel disease, allergy such as Celiac Sprue, dietary/dairy problems, colitis, ulcers.  No recent sick contacts/gastroenteritis.  No travel outside the country.  No changes in diet. Renal: No UTIs, No  hematuria Genital:  No drainage, bleeding, masses Musculoskeletal: No severe joint pain.  Good ROM major joints Skin:  No sores or lesions.  No rashes Heme/Lymph:  No easy bleeding.  No swollen lymph nodes Neuro: No focal weakness/numbness.  No seizures Psych: No suicidal ideation.  No hallucinations  BP (!) 141/62   Pulse 90   Temp 98.8 F (37.1 C) (Oral)   Resp (!) 25   Ht 5\' 8"  (1.727 m)   Wt 109.1 kg   SpO2 94%   BMI 36.57 kg/m   Physical Exam: Constitutional: Not cachectic.  Hygeine adequate.  Vitals signs as above.  Smiling and chatty today. Eyes: Pupils reactive, normal extraocular movements. Sclera nonicteric Neuro: CN II-XII intact.  No major focal sensory defects.  No major motor deficits. Lymph: No head/neck/groin lymphadenopathy Psych:  No severe agitation.  No severe anxiety.  Judgment & insight Adequate, Oriented x3, HENT: Normocephalic, Mucus membranes moist.  No thrush.   Neck: Supple, No tracheal deviation.  No obvious thyromegaly Chest: No pain to chest wall compression.  Good respiratory excursion.  No audible wheezing CV:  Pulses intact.  Regular rhythm.  No major extremity edema Abdomen:  Somewhat firm.  Moderately distended.  Nontender. No incarcerated hernias.  No hepatomegaly.  No splenomegaly Gen:  No inguinal hernias.  No inguinal lymphadenopathy.   Ext: No obvious deformity or contracture no significant edema.  No cyanosis Skin: No major subcutaneous nodules.  Warm and dry Musculoskeletal: Severe joint rigidity not present.  No obvious clubbing.  No digital petechiae.     Results:   Labs: Results for orders placed  or performed during the hospital encounter of 01/16/21 (from the past 48 hour(s))  Occult bld gastric/duodenum (cup to lab)     Status: Abnormal   Collection Time: 01/25/21 11:19 AM  Result Value Ref Range   pH, Gastric 5    Occult Blood, Gastric POSITIVE (A) NEGATIVE    Comment: Performed at [facility], [place]  [address], [place], [place] [address]  Glucose, capillary     Status: Abnormal   Collection Time: 01/25/21 11:45 AM  Result Value Ref Range   Glucose-Capillary 201 (H) 70 - 99 mg/dL    Comment: Glucose reference range applies only to samples taken after fasting for at least 8 hours.  Hemoglobin and hematocrit, blood     Status: Abnormal   Collection Time: 01/25/21 12:24 PM  Result Value Ref Range   Hemoglobin 8.5 (L) 13.0 - 17.0 g/dL   HCT 25.7 (L) 39.0 - 52.0 %    Comment: Performed at [facility], [place] [address], [place], [place] [address]  Glucose, capillary     Status: Abnormal   Collection Time: 01/25/21  3:55 PM  Result Value Ref Range   Glucose-Capillary 191 (H) 70 - 99 mg/dL    Comment: Glucose reference range applies only to samples taken after fasting for at least 8 hours.  Glucose, capillary     Status: Abnormal   Collection Time: 01/25/21  7:22 PM  Result Value Ref Range   Glucose-Capillary 176 (H) 70 - 99 mg/dL    Comment: Glucose reference range applies only to samples taken after fasting for at least 8 hours.  Hemoglobin and hematocrit, blood     Status: Abnormal   Collection Time: 01/25/21  9:04 PM  Result Value Ref Range   Hemoglobin 8.2 (L) 13.0 - 17.0 g/dL   HCT 25.1 (L) 39.0 - 52.0 %    Comment: Performed at [facility], [place] [address] [place], [place] [address]  Heparin level (unfractionated)     Status: Abnormal   Collection Time: 01/25/21  9:04 PM  Result Value Ref Range   Heparin Unfractionated 0.28 (L) 0.30 - 0.70 IU/mL    Comment: (NOTE) The clinical reportable range upper limit is being lowered to >1.10 to align with the FDA approved guidance for the current laboratory assay.  If heparin results are below expected values, and patient dosage has  been confirmed, suggest follow up testing of antithrombin III levels. Performed at [facility], [place] [address], [place], [place] [address]    Glucose, capillary     Status: Abnormal   Collection Time: 01/25/21 11:01 PM  Result Value Ref Range   Glucose-Capillary 210 (H) 70 - 99 mg/dL    Comment: Glucose reference range applies only to samples taken after fasting for at least 8 hours.  Glucose, capillary     Status: Abnormal   Collection Time: 01/26/21  3:52 AM  Result Value Ref Range   Glucose-Capillary 199 (H) 70 - 99 mg/dL    Comment: Glucose reference range applies only to samples taken after fasting for at least 8 hours.  Basic metabolic panel     Status: Abnormal   Collection Time: 01/26/21  4:27 AM  Result Value Ref Range   Sodium 143 135 - 145 mmol/L   Potassium 3.4 (L) 3.5 - 5.1 mmol/L   Chloride 106 98 - 111 mmol/L   CO2 26 22 - 32 mmol/L   Glucose, Bld 340 (H) 70 - 99 mg/dL  Comment: Glucose reference range applies only to samples taken after fasting for at least 8 hours.   BUN 72 (H) 8 - 23 mg/dL   Creatinine, Ser 1.30 (H) 0.61 - 1.24 mg/dL   Calcium 8.5 (L) 8.9 - 10.3 mg/dL   GFR, Estimated 57 (L) >60 mL/min    Comment: (NOTE) Calculated using the CKD-EPI Creatinine Equation (2021)    Anion gap 11 5 - 15    Comment: Performed at [facility], [place] [address], [place], [place] [address]  Magnesium     Status: None   Collection Time: 01/26/21  4:27 AM  Result Value Ref Range   Magnesium 2.0 1.7 - 2.4 mg/dL    Comment: Performed at [facility], [place] [address] [place], [place] [address]  Phosphorus     Status: Abnormal   Collection Time: 01/26/21  4:27 AM  Result Value Ref Range   Phosphorus 2.4 (L) 2.5 - 4.6 mg/dL    Comment: Performed at [facility], [place] [address], [place], [place] [address]  CBC     Status: Abnormal   Collection Time: 01/26/21  4:27 AM  Result Value Ref Range   WBC 17.3 (H) 4.0 - 10.5 K/uL   RBC 2.39 (L) 4.22 - 5.81 MIL/uL   Hemoglobin 7.5 (L) 13.0 - 17.0 g/dL   HCT 23.5 (L) 39.0 - 52.0 %   MCV 98.3 80.0 - 100.0 fL    MCH 31.4 26.0 - 34.0 pg   MCHC 31.9 30.0 - 36.0 g/dL   RDW 15.8 (H) 11.5 - 15.5 %   Platelets 204 150 - 400 K/uL   nRBC 1.0 (H) 0.0 - 0.2 %    Comment: Performed at [facility], [place] [address] [place], [place] [address]  ABO/Rh     Status: None   Collection Time: 01/26/21  4:27 AM  Result Value Ref Range   ABO/RH(D)      O POS Performed at [facility], [place] [address], [place], [place] [address]   Glucose, capillary     Status: Abnormal   Collection Time: 01/26/21  7:46 AM  Result Value Ref Range   Glucose-Capillary 197 (H) 70 - 99 mg/dL    Comment: Glucose reference range applies only to samples taken after fasting for at least 8 hours.  Glucose, capillary     Status: Abnormal   Collection Time: 01/26/21 11:27 AM  Result Value Ref Range   Glucose-Capillary 199 (H) 70 - 99 mg/dL    Comment: Glucose reference range applies only to samples taken after fasting for at least 8 hours.  Hemoglobin and hematocrit, blood     Status: Abnormal   Collection Time: 01/26/21 12:08 PM  Result Value Ref Range   Hemoglobin 6.8 (LL) 13.0 - 17.0 g/dL    Comment: This critical result has verified and been called to [place] by Marvell Fuller on 05 21 2022 at 1238, and has been read back. CRITICAL RESULT VERIFIED   HCT 21.3 (L) 39.0 - 52.0 %    Comment: Performed at [facility], [place] [address] [place], [place] [address]  Heparin level (unfractionated)     Status: None   Collection Time: 01/26/21 12:08 PM  Result Value Ref Range   Heparin Unfractionated 0.59 0.30 - 0.70 IU/mL    Comment: (NOTE) The clinical reportable range upper limit is being lowered to >1.10 to align with the FDA approved guidance for the current  laboratory assay.  If heparin results are below expected values, and patient dosage has  been confirmed, suggest follow up testing of antithrombin III levels. Performed at [facility], [place] [address], [place], [place] [address]   Type and screen [place]     Status: None (Preliminary result)   Collection Time: 01/26/21 12:08 PM  Result Value Ref Range   ABO/RH(D) O POS    Antibody Screen NEG    Sample Expiration 01/29/2021,2359    Unit Number [id number]    Blood Component Type RED CELLS,LR    Unit division 00    Status of Unit ISSUED    Transfusion Status OK TO TRANSFUSE    Crossmatch Result Compatible    Unit Number [id number]    Blood Component Type RED CELLS,LR    Unit division 00    Status of Unit ISSUED    Transfusion Status OK TO TRANSFUSE    Crossmatch Result      Compatible Performed at [facility], [place] [address] [place], [place] [address]   Prepare RBC (crossmatch)     Status: None   Collection Time: 01/26/21 12:08 PM  Result Value Ref Range   Order Confirmation      ORDER PROCESSED BY BLOOD BANK Performed at [facility], [place] [address], [place], [place] [address]   Glucose, capillary     Status: Abnormal   Collection Time: 01/26/21  4:14 PM  Result Value Ref Range   Glucose-Capillary 195 (H) 70 - 99 mg/dL    Comment: Glucose reference range applies only to samples taken after fasting for at least 8 hours.  Glucose, capillary     Status: Abnormal   Collection Time: 01/26/21  7:33 PM  Result Value Ref Range   Glucose-Capillary 171 (H) 70 - 99 mg/dL    Comment: Glucose reference range applies only to samples taken after fasting for at least 8 hours.  Hemoglobin and hematocrit, blood     Status: Abnormal   Collection Time: 01/26/21 11:00 PM  Result Value Ref Range   Hemoglobin 7.8 (L) 13.0 - 17.0 g/dL   HCT 23.9 (L) 39.0 - 52.0 %    Comment: Performed at [facility], [place] [address] [place], [place] [address]  Glucose, capillary     Status: Abnormal   Collection Time: 01/26/21 11:15 PM  Result Value Ref Range   Glucose-Capillary 166 (H) 70 - 99 mg/dL    Comment:  Glucose reference range applies only to samples taken after fasting for at least 8 hours.  Glucose, capillary     Status: Abnormal   Collection Time: 01/27/21  3:25 AM  Result Value Ref Range   Glucose-Capillary 153 (H) 70 - 99 mg/dL    Comment: Glucose reference range applies only to samples taken after fasting for at least 8 hours.  Basic metabolic panel     Status: Abnormal   Collection Time: 01/27/21  5:09 AM  Result Value Ref Range   Sodium 144 135 - 145 mmol/L   Potassium 3.9 3.5 - 5.1 mmol/L   Chloride 111 98 - 111 mmol/L   CO2 22 22 - 32 mmol/L   Glucose, Bld 333 (H) 70 - 99 mg/dL    Comment: Glucose reference range applies only to samples taken after fasting for at least 8 hours.   BUN 83 (H) 8 - 23 mg/dL   Creatinine, Ser 1.29 (H) 0.61 - 1.24 mg/dL   Calcium  8.3 (L) 8.9 - 10.3 mg/dL   GFR, Estimated 58 (L) >60 mL/min    Comment: (NOTE) Calculated using the CKD-EPI Creatinine Equation (2021)    Anion gap 11 5 - 15    Comment: Performed at [facility], [address] [place], [place] [address]  Magnesium     Status: None   Collection Time: 01/27/21  5:09 AM  Result Value Ref Range   Magnesium 2.1 1.7 - 2.4 mg/dL    Comment: Performed at [facility], [address] [place], [place] [address]  Phosphorus     Status: None   Collection Time: 01/27/21  5:09 AM  Result Value Ref Range   Phosphorus 3.4 2.5 - 4.6 mg/dL    Comment: Performed at [facility], [address] [place], [place] [address]  CBC     Status: Abnormal   Collection Time: 01/27/21  5:41 AM  Result Value Ref Range   WBC 17.4 (H) 4.0 - 10.5 K/uL   RBC 2.65 (L) 4.22 - 5.81 MIL/uL   Hemoglobin 7.9 (L) 13.0 - 17.0 g/dL   HCT 21.325.4 (L) 08.639.0 - 57.852.0 %   MCV 95.8 80.0 - 100.0 fL   MCH 29.8 26.0 - 34.0 pg   MCHC 31.1 30.0 - 36.0 g/dL   RDW 46.919.3 (H) 62.911.5 - 52.815.5 %   Platelets 178 150 - 400 K/uL   nRBC 1.5 (H) 0.0 - 0.2 %    Comment: Performed at  [facility], [address] [place], [place] [address]  Glucose, capillary     Status: Abnormal   Collection Time: 01/27/21  7:49 AM  Result Value Ref Range   Glucose-Capillary 176 (H) 70 - 99 mg/dL    Comment: Glucose reference range applies only to samples taken after fasting for at least 8 hours.   Comment 1 Notify RN    Comment 2 Document in Chart     Imaging / Studies: CT ABDOMEN PELVIS WO CONTRAST  Result Date: 01/23/2021 CLINICAL DATA:  Abdominal distension. EXAM: CT ABDOMEN AND PELVIS WITHOUT CONTRAST TECHNIQUE: Multidetector CT imaging of the abdomen and pelvis was performed following the standard protocol without IV contrast. COMPARISON:  March 05, 2020. FINDINGS: Lower chest: Mild bibasilar subsegmental atelectasis is noted, left greater than right. Hepatobiliary: No focal liver abnormality is seen. No gallstones, gallbladder wall thickening, or biliary dilatation. Pancreas: Unremarkable. No pancreatic ductal dilatation or surrounding inflammatory changes. Spleen: Normal in size without focal abnormality. Adrenals/Urinary Tract: Adrenal glands appear normal. Bilateral renal cysts are noted. No renal or ureteral calculi are noted. No hydronephrosis or renal obstruction is noted. Urinary bladder is decompressed secondary to Foley catheter. Stomach/Bowel: Nasogastric tube tip is seen in distal stomach. Stomach and appendix are unremarkable. Dilated small bowel loops are noted in the pelvis concerning for ileus or distal small bowel obstruction. No colonic dilatation is noted. Vascular/Lymphatic: Aortic atherosclerosis. No enlarged abdominal or pelvic lymph nodes. Reproductive: Prostate is unremarkable. Other: No abdominal wall hernia or abnormality. No abdominopelvic ascites. Musculoskeletal: No acute or significant osseous findings. IMPRESSION: Dilated small bowel loops are noted in the pelvis and lower abdomen concerning for ileus or distal small bowel obstruction. Mild  bibasilar subsegmental atelectasis, left greater than right. Aortic Atherosclerosis (ICD10-I70.0). Electronically Signed   By: Lupita RaiderJames  Green Jr M.D.   On: 01/23/2021 10:04   DG Chest 1 View  Result Date: 01/20/2021 CLINICAL DATA:  Central catheter placement EXAM: CHEST  1 VIEW COMPARISON:  Chest CT March 19, 2017; abdominal radiographs Jan 20, 2021 FINDINGS: Nasogastric tube tip and side port in stomach. Central catheter tip in superior vena cava near cavoatrial junction. No pneumothorax. There is airspace opacity in the left base. Lungs elsewhere are clear. Heart is upper normal in size with pulmonary vascularity normal. No adenopathy. No bone lesions. Loops of dilated bowel again noted in upper abdomen region. IMPRESSION: Tube and catheter positions as described without pneumothorax. Airspace opacity consistent with pneumonia left base. Lungs elsewhere clear. Heart upper normal in size. Loops of bowel dilated in upper abdomen, demonstrated on abdomen radiographs from earlier in the day. Electronically Signed   By: Lowella Grip III M.D.   On: 01/20/2021 15:45   DG Knee 2 Views Left  Result Date: 01/16/2021 CLINICAL DATA:  Status post total knee replacement EXAM: LEFT KNEE - 1-2 VIEW COMPARISON:  None. FINDINGS: Frontal and lateral views were obtained. 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 No fracture or dislocation. No erosive changes. There is a spur along the anterior patella. There is soft tissue air in the joint. There are overlying skin staples. There is a sclerotic lesion in the proximal left tibia measuring 4.9 x 3.6 cm. 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 No fracture or dislocation. Acute postoperative changes noted. 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 Electronically Signed   By: Lowella Grip III M.D.   On: 01/16/2021 12:37   DG Abd  1 View  Result Date: 01/21/2021 CLINICAL DATA:  Ileus EXAM: ABDOMEN - 1 VIEW COMPARISON:  01/20/2021 FINDINGS: The patient was reportedly not able to be rolled into the left lateral decubitus position for a decubitus projection. A nasogastric tube is coiled in the left upper quadrant, presumably in a relatively collapsed stomach. Gas-filled loops of small and large bowel observed, with small bowel loops measuring up to 4.8 cm on today's examination. This compares to up to 5.5 cm on the 01/20/2021 exam. The visible gas-filled small bowel loops are primarily in the left abdomen. Lumbar spondylosis noted. IMPRESSION: 1. Gaseous prominence of large bowel, with small bowel loops currently dilated up to 4.8 cm (previously 5.5 cm yesterday). The appearance is abnormal but nonspecific and certainly may reflect the patient's clinical diagnosis of ileus. A nasogastric tube is coiled in the stomach. Electronically Signed   By: Van Clines M.D.   On: 01/21/2021 13:55   DG Abd 1 View  Result Date: 01/20/2021 CLINICAL DATA:  NG tube placement EXAM: ABDOMEN - 1 VIEW COMPARISON:  01/20/2021 12:48 p.m. FINDINGS: Interval placement of esophagogastric tube, tip and side port below the diaphragm. The included small bowel and colon remain diffusely gas distended, largest loops included colon measuring up to 8.9 cm. IMPRESSION: 1. Interval placement of esophagogastric tube, tip and side port below the diaphragm. 2. The included small bowel and colon remain diffusely gas distended, largest loops included colon measuring up to 8.9 cm. Electronically Signed   By: Eddie Candle M.D.   On: 01/20/2021 14:21   DG Abd 1 View  Result Date: 01/19/2021 CLINICAL DATA:  Abdominal pain and constipation EXAM: ABDOMEN - 1 VIEW COMPARISON:  CT abdomen and pelvis March 05, 2020 FINDINGS: There is generalized bowel dilatation without appreciable air-fluid levels. No free air appreciable. Tiny pelvic calcifications likely represent  phleboliths. IMPRESSION: Bowel dilatation without air-fluid levels. No free air evident on supine examination. Bowel-gas pattern is suggestive of ileus.  Bowel obstruction felt to be less likely although not excluded. Electronically Signed   By: Lowella Grip III M.D.   On: 01/19/2021 19:49   CT HEAD WO CONTRAST  Result Date: 01/26/2021 CLINICAL DATA:  Nonspecific dizziness.  Vision changes for 5 days. EXAM: CT HEAD WITHOUT CONTRAST TECHNIQUE: Contiguous axial images were obtained from the base of the skull through the vertex without intravenous contrast. COMPARISON:  None. FINDINGS: Brain: Dental artifact causes streak through the skull base. No intracranial hemorrhage, mass effect, or midline shift. No hydrocephalus. The basilar cisterns are patent. No evidence of territorial infarct or acute ischemia. No extra-axial or intracranial fluid collection. Vascular: Atherosclerosis of skullbase vasculature without hyperdense vessel or abnormal calcification. Skull: No fracture or focal lesion. Sinuses/Orbits: Nasogastric tube in place.  No acute findings. Other: None. IMPRESSION: No acute intracranial abnormality. Electronically Signed   By: Keith Rake M.D.   On: 01/26/2021 19:20   NM Pulmonary Perfusion  Result Date: 01/21/2021 CLINICAL DATA:  Post knee surgery. Shortness of breath. Low O2 sats. EXAM: NUCLEAR MEDICINE PERFUSION LUNG SCAN TECHNIQUE: Perfusion images were obtained in multiple projections after intravenous injection of radiopharmaceutical. Ventilation scans intentionally deferred if perfusion scan and chest x-ray adequate for interpretation during COVID 19 epidemic. RADIOPHARMACEUTICALS:  4.12 mCi Tc-69m MAA IV COMPARISON:  Chest x-ray 01/20/2021 FINDINGS: There are no segmental or subsegmental perfusion defects to suggest pulmonary embolus. Minimal patchy nonsegmental defects. IMPRESSION: No evidence of pulmonary embolus. Electronically Signed   By: Rolm Baptise M.D.   On: 01/21/2021  10:53   US RENAL  Result Date: 01/20/2021 CLINICAL DATA:  Acute kidney injury EXAM: RENAL / URINARY TRACT ULTRASOUND COMPLETE COMPARISON:  03/05/2020 FINDINGS: Right Kidney: Renal measurements: 12.9 x 5.5 x 4.7 cm = volume: 175 ML. Upper pole cyst measures 4.6 x 4.5 x 3.4 cm. There is no hydronephrosis or mass. Normal parenchymal echogenicity. Left Kidney: Renal measurements: 12.4 by 5.4 x 3.2 cm = volume: 112.2 mL. Inferior pole cyst measures 4.3 x 4.2 x 3.7 cm. There is no mass or hydronephrosis. Normal parenchymal echogenicity. Bladder: Bladder appears collapsed. Other: None. IMPRESSION: 1. No acute findings.  No signs of obstructive uropathy. 2. Bilateral kidney cysts. Electronically Signed   By: Kerby Moors M.D.   On: 01/20/2021 09:19   DG CHEST PORT 1 VIEW  Result Date: 01/27/2021 CLINICAL DATA:  Shortness of breath. EXAM: PORTABLE CHEST 1 VIEW.  AP portable semi erect. COMPARISON:  Chest x-ray 01/24/2021, CT chest 01/26/2021 FINDINGS: Right PICC with tip overlying the right atrium. Enteric tube coursing below the hemidiaphragm with tip and side port collimated off view. The tube is noted to loop overlying the gastric lumen. The heart size and mediastinal contours are unchanged. Calcifications of the aorta. Left base streaky airspace opacity likely representing atelectasis. No pulmonary edema. Interval improvement/resolution of a non existing versus trace left pleural effusion. No right pleural effusion. No pneumothorax. No acute osseous abnormality. IMPRESSION: 1. Streaky airspace opacity at the left base with elevated left hemidiaphragm likely representing atelectasis. 2. Improved/possible complete resolution of a left pleural effusion. 3. Enteric tube courses below the hemidiaphragm with tip and side port collimated off view. Enteric tube is noted to be coiled overlying the gastric lumen. Consider retracting by 5 cm. Electronically Signed   By: Iven Finn M.D.   On: 01/27/2021 06:52   DG  Chest Port 1 View  Result Date: 01/24/2021 CLINICAL DATA:  Aspiration pneumonia. EXAM: PORTABLE CHEST 1 VIEW COMPARISON:  01/20/2021. FINDINGS: NG  tube and left IJ line in stable position. Mediastinal prominence, this may be related to AP portable technique. Follow-up PA and lateral chest x-ray suggested. Stable cardiomegaly. Low lung volumes. Low lung volumes with left base atelectasis/infiltrate again noted. Mild left perihilar infiltrate/edema. Small left pleural effusion can not be excluded. No pneumothorax. IMPRESSION: 1.  NG tube left IJ line stable position. 2. Mediastinal prominence, this may be related to AP portable technique. Follow-up PA and lateral chest x-ray suggested. 3.  Stable cardiomegaly. 4. Low lung volumes with left base atelectasis/infiltrate again noted. Mild left perihilar infiltrates/edema. Small left pleural effusion cannot be excluded. Electronically Signed   By: Marcello Moores  Register   On: 01/24/2021 05:52   DG Abd 2 Views  Result Date: 01/26/2021 CLINICAL DATA:  Abdominal distension. EXAM: ABDOMEN - 2 VIEW COMPARISON:  01/25/2021 FINDINGS: An NG tube is again noted with tip overlying the mid stomach. Gaseous distension of colon and small bowel are not significantly changed. Oral contrast administered on 01/24/2021 appears to lie within the cecum. IMPRESSION: Unchanged gaseous distension of colon and small bowel. Oral contrast administered 2 days ago lies within the cecum. Electronically Signed   By: Margarette Canada M.D.   On: 01/26/2021 10:41   DG Abd 2 Views  Result Date: 01/24/2021 CLINICAL DATA:  Ileus EXAM: ABDOMEN - 2 VIEW COMPARISON:  01/21/2021 FINDINGS: Nasogastric tube is seen looped within the gastric lumen with its tip in the expected gastric antrum. Multiple gas-filled dilated loops of small bowel are again identified throughout the abdomen with a relative paucity of gas within the colon in keeping with a distal small bowel obstruction. No free intraperitoneal gas. Inferior  pelvis excluded from view. No organomegaly. IMPRESSION: Nasogastric tube tip within the a gastric antrum. Persistent small bowel obstruction. Electronically Signed   By: Fidela Salisbury MD   On: 01/24/2021 05:52   DG ABD ACUTE 2+V W 1V CHEST  Result Date: 01/20/2021 CLINICAL DATA:  Decreased oxygen saturations and increased abdominal distention. EXAM: DG ABDOMEN ACUTE WITH 1 VIEW CHEST COMPARISON:  01/19/2021 FINDINGS: There is significant gaseous distension of large and small bowel loops. Small bowel loop in the LEFT UPPER QUADRANT is 5.5 centimeters. No free intraperitoneal air beneath the diaphragm. Heart size is normal. Low lung volumes. Patchy parenchymal opacities are identified throughout the lungs bilaterally, particularly involving the apices and LEFT LOWER lobe. IMPRESSION: Findings consistent with ileus or partial small bowel obstruction. Multifocal infiltrates in the lungs. Electronically Signed   By: Nolon Nations M.D.   On: 01/20/2021 14:27   DG Abd Portable 1V-Small Bowel Obstruction Protocol-24 hr delay  Result Date: 01/25/2021 CLINICAL DATA:  A 24 hour delay film for small-bowel obstruction. EXAM: PORTABLE ABDOMEN - 1 VIEW COMPARISON:  Film from the previous day. FINDINGS: Gastric catheter is again noted coiled within the stomach. Persistent small bowel dilatation is noted although colonic gas is again seen. By history contrast was administered 24 hours previous and contrast is noted within the distal small bowel predominately within the right lower quadrant. No significant colonic contrast is noted. No free air is seen. IMPRESSION: Persistent and stable small bowel dilatation when compared with the prior exam. Previously administered contrast material lies in the distal aspect of the small bowel. Electronically Signed   By: Inez Catalina M.D.   On: 01/25/2021 15:19   DG Abd Portable 1V-Small Bowel Obstruction Protocol-initial, 8 hr delay  Result Date: 01/24/2021 CLINICAL DATA:  Small  bowel obstruction. EXAM: PORTABLE ABDOMEN - 1 VIEW  COMPARISON:  01/24/2021, earlier same day FINDINGS: Diffuse gaseous bowel distension persists without substantial interval change. NG tube again noted in the stomach. IMPRESSION: No substantial interval change. Electronically Signed   By: Misty Stanley M.D.   On: 01/24/2021 14:00   ECHOCARDIOGRAM COMPLETE  Result Date: 01/21/2021    ECHOCARDIOGRAM REPORT   Patient Name:   KHAALID TRUXILLO Date of Exam: 01/21/2021 Medical Rec #:  [id number]    Height:       68.0 in Accession #:    [id number]   Weight:       230.2 lb Date of Birth:  [birth year]   BSA:          2.169 m Patient Age:    75 years     BP:           114/52 mmHg Patient Gender: M            HR:           117 bpm. Exam Location:  Inpatient Procedure: 2D Echo, Cardiac Doppler, Color Doppler and Intracardiac            Opacification Agent Indications:    I48.91* Unspeicified atrial fibrillation  History:        Patient has prior history of Echocardiogram examinations, most                 recent 09/23/2016. CAD, Abnormal ECG, Pulmonary HTN,                 Arrythmias:Atrial Fibrillation; Risk Factors:Hypertension,                 Diabetes and Dyslipidemia. Lymphoma.  Sonographer:    Roseanna Rainbow RDCS Referring Phys: [id number] Oletha Blend [facility]  Sonographer Comments: Technically difficult study due to poor echo windows and patient is morbidly obese. Image acquisition challenging due to patient body habitus. Supine. IMPRESSIONS  1. Poor acoustic windows limit study even with Definity. Overall LVEF appears normal with no signficant wall motion abnormalities . Left ventricular ejection fraction, by estimation, is 50 to 55%. The left ventricle has low normal function. There is mild left ventricular hypertrophy.  2. Right ventricular systolic function is moderately reduced. The right ventricular size is mildly enlarged. There is moderately elevated pulmonary artery systolic pressure.  3. Trivial mitral valve regurgitation.  4.  The aortic valve was not well visualized. Aortic valve regurgitation is not visualized. Mild to moderate aortic valve sclerosis/calcification is present, without any evidence of aortic stenosis.  5. The inferior vena cava is dilated in size with <50% respiratory variability, suggesting right atrial pressure of 15 mmHg. Comparison(s): The left ventricular function is unchanged. The right ventricular systolic function mildly worse. FINDINGS  Left Ventricle: Poor acoustic windows limit study even with Definity. Overall LVEF appears normal with no signficant wall motion abnormalities. Left ventricular ejection fraction, by estimation, is 50 to 55%. The left ventricle has low normal function. Definity contrast agent was given IV to delineate the left ventricular endocardial borders. The left ventricular internal cavity size was normal in size. There is mild left ventricular hypertrophy. Right Ventricle: The right ventricular size is mildly enlarged. Right ventricular systolic function is moderately reduced. There is moderately elevated pulmonary artery systolic pressure. The tricuspid regurgitant velocity is 3.27 m/s, and with an assumed right atrial pressure of 15 mmHg, the estimated right ventricular systolic pressure is [id number] mmHg. Left Atrium: Left atrial size was normal in size. Right Atrium: Right atrial size was normal in size.  Pericardium: Trivial pericardial effusion is present. Mitral Valve: There is mild thickening of the mitral valve leaflet(s). Mild mitral annular calcification. Trivial mitral valve regurgitation. Tricuspid Valve: The tricuspid valve is not well visualized. Tricuspid valve regurgitation is trivial. Aortic Valve: The aortic valve was not well visualized. Aortic valve regurgitation is not visualized. Mild to moderate aortic valve sclerosis/calcification is present, without any evidence of aortic stenosis. Aortic valve mean gradient measures 7.0 mmHg.  Aortic valve peak gradient measures 12.2  mmHg. Aortic valve area, by VTI measures 1.32 cm. Pulmonic Valve: The pulmonic valve was not well visualized. Pulmonic valve regurgitation is not visualized. No evidence of pulmonic stenosis. Aorta: The aortic root is normal in size and structure. Venous: The inferior vena cava is dilated in size with less than 50% respiratory variability, suggesting right atrial pressure of 15 mmHg. IAS/Shunts: No atrial level shunt detected by color flow Doppler.  LEFT VENTRICLE PLAX 2D LVIDd:         3.80 cm LVIDs:         2.70 cm LV PW:         1.40 cm LV IVS:        1.10 cm LVOT diam:     1.70 cm LV SV:         32 LV SV Index:   15 LVOT Area:     2.27 cm  IVC IVC diam: 3.10 cm LEFT ATRIUM             Index       RIGHT ATRIUM           Index LA diam:        3.90 cm 1.80 cm/m  RA Area:     20.60 cm LA Vol (A2C):   26.7 ml 12.31 ml/m RA Volume:   62.50 ml  28.81 ml/m LA Vol (A4C):   35.3 ml 16.27 ml/m LA Biplane Vol: 31.6 ml 14.57 ml/m  AORTIC VALVE AV Area (Vmax):    1.39 cm AV Area (Vmean):   1.26 cm AV Area (VTI):     1.32 cm AV Vmax:           175.00 cm/s AV Vmean:          120.400 cm/s AV VTI:            0.244 m AV Peak Grad:      12.2 mmHg AV Mean Grad:      7.0 mmHg LVOT Vmax:         107.00 cm/s LVOT Vmean:        66.600 cm/s LVOT VTI:          0.142 m LVOT/AV VTI ratio: 0.58  AORTA Ao Root diam: 3.10 cm Ao Asc diam:  3.50 cm MITRAL VALVE                TRICUSPID VALVE MV Area (PHT): 4.06 cm     TR Peak grad:   42.8 mmHg MV Decel Time: 187 msec     TR Vmax:        327.00 cm/s MV E velocity: 112.33 cm/s                             SHUNTS                             Systemic VTI:  0.14 m  Systemic Diam: 1.70 cm Dorris Carnes MD Electronically signed by Dorris Carnes MD Signature Date/Time: 01/21/2021/8:19:54 PM    Final    VAS [place] LOWER EXTREMITY VENOUS (DVT)  Result Date: 01/21/2021  Lower Venous DVT Study Patient Name:  ZBIGNIEW ANDIS  Date of Exam:   01/21/2021 Medical Rec #: [id number]      Accession #:    [id number] Date of Birth: 1945/09/20    Patient Gender: M Patient Age:   74Y Exam Location:  [facility] Procedure:      VAS [place] LOWER EXTREMITY VENOUS (DVT) Referring Phys: [id number] Candee Furbish --------------------------------------------------------------------------------  Indications: Edema.  Limitations: Poor ultrasound/tissue interface. Comparison Study: No prior studies. Performing Technologist: Oliver Hum RVT  Examination Guidelines: A complete evaluation includes B-mode imaging, spectral Doppler, color Doppler, and power Doppler as needed of all accessible portions of each vessel. Bilateral testing is considered an integral part of a complete examination. Limited examinations for reoccurring indications may be performed as noted. The reflux portion of the exam is performed with the patient in reverse Trendelenburg.  +---------+---------------+---------+-----------+----------+--------------+ RIGHT    CompressibilityPhasicitySpontaneityPropertiesThrombus Aging +---------+---------------+---------+-----------+----------+--------------+ CFV      Full           Yes      Yes                                 +---------+---------------+---------+-----------+----------+--------------+ SFJ      Full                                                        +---------+---------------+---------+-----------+----------+--------------+ FV Prox  Full                                                        +---------+---------------+---------+-----------+----------+--------------+ FV Mid   Full                                                        +---------+---------------+---------+-----------+----------+--------------+ FV DistalFull                                                        +---------+---------------+---------+-----------+----------+--------------+ PFV      Full                                                         +---------+---------------+---------+-----------+----------+--------------+ POP      Full           Yes      Yes                                 +---------+---------------+---------+-----------+----------+--------------+  PTV      Full                                                        +---------+---------------+---------+-----------+----------+--------------+ PERO     Full                                                        +---------+---------------+---------+-----------+----------+--------------+   +---------+---------------+---------+-----------+----------+-------------------+ LEFT     CompressibilityPhasicitySpontaneityPropertiesThrombus Aging      +---------+---------------+---------+-----------+----------+-------------------+ CFV      Full           Yes      Yes                                      +---------+---------------+---------+-----------+----------+-------------------+ SFJ      Full                                                             +---------+---------------+---------+-----------+----------+-------------------+ FV Prox  Full                                                             +---------+---------------+---------+-----------+----------+-------------------+ FV Mid   Full                                                             +---------+---------------+---------+-----------+----------+-------------------+ FV DistalFull                                                             +---------+---------------+---------+-----------+----------+-------------------+ PFV      Full                                                             +---------+---------------+---------+-----------+----------+-------------------+ POP      Full           Yes      Yes                                      +---------+---------------+---------+-----------+----------+-------------------+ PTV  Full                                                              +---------+---------------+---------+-----------+----------+-------------------+ PERO                                                  Not well visualized +---------+---------------+---------+-----------+----------+-------------------+     Summary: RIGHT: - There is no evidence of deep vein thrombosis in the lower extremity. However, portions of this examination were limited- see technologist comments above.  - No cystic structure found in the popliteal fossa.  LEFT: - There is no evidence of deep vein thrombosis in the lower extremity. However, portions of this examination were limited- see technologist comments above.  - No cystic structure found in the popliteal fossa.  *See table(s) above for measurements and observations. Electronically signed by Monica Martinez MD on 01/21/2021 at 4:57:23 PM.    Final    [place] EKG SITE RITE  Result Date: 01/23/2021 If Site Rite image not attached, placement could not be confirmed due to current cardiac rhythm.  CT CHEST ABDOMEN PELVIS WO CONTRAST  Result Date: 01/26/2021 CLINICAL DATA:  Abdominal distension.  Anemia. EXAM: CT CHEST, ABDOMEN AND PELVIS WITHOUT CONTRAST TECHNIQUE: Multidetector CT imaging of the chest, abdomen and pelvis was performed following the standard protocol without IV contrast. COMPARISON:  Recent chest and abdominal radiographs. Abdominopelvic CT 01/23/2021 FINDINGS: CT CHEST FINDINGS Cardiovascular: Right upper extremity PICC tip at the atrial caval junction. Aortic atherosclerosis and tortuosity without aneurysm. There is dilatation of the main pulmonary artery at 4.3 cm. Mild cardiomegaly. Coronary artery calcifications. No pericardial effusion. Mediastinum/Nodes: Small mediastinal lymph nodes are not enlarged by size criteria. Limited assessment for hilar adenopathy on this noncontrast exam. Nasogastric tube in the esophagus, which is mildly patulous despite decompression. No suspicious thyroid  nodule. Lungs/Pleura: Small bilateral pleural effusions. Associated basilar atelectasis. Additional compressive atelectasis in the left lower lobe related to elevated hemidiaphragm. Motion artifact limits assessment of pulmonary parenchyma. Linear platelike atelectasis in the lingula. No septal thickening or findings of pulmonary edema. No pneumothorax. Musculoskeletal: Diffuse degenerative change throughout the thoracic spine. No acute osseous abnormalities are seen. No chest wall soft tissue abnormality. CT ABDOMEN PELVIS FINDINGS Hepatobiliary: Motion artifact through the liver and gallbladder limiting assessment. No focal hepatic abnormality allowing for motion and lack of IV contrast. Liver is prominent size spanning 20 cm cranial caudal. No calcified gallstones. Pancreas: Motion artifact through the pancreas. No obvious ductal dilatation or inflammation. Spleen: Upper normal in size spanning 13.1 cm. No focal abnormality allowing for motion and lack of IV contrast. Splenule inferiorly. Adrenals/Urinary Tract: No adrenal nodule. Motion artifact through the kidneys limits detailed assessment. There is no hydronephrosis. Bilateral renal cysts. More detailed assessment is limited. The urinary bladder is minimally distended. Mild bladder wall thickening. Stomach/Bowel: Enteric tube tip in the stomach. Stomach is decompressed. Small bowel loops are diffusely dilated measuring up to 3.9 cm, fluid and contrast filled. No obstruction, as enteric contrast reaches the colon. No evidence of small bowel inflammation. Appendix is not well seen on this current exam due to motion. Administered enteric contrast reaches the sigmoid  colon. Sigmoid colonic diverticulosis without diverticulitis. Sigmoid colonic redundancy. Small volume of colonic stool. No evidence of colonic inflammation. Vascular/Lymphatic: Moderate aortic atherosclerosis. No aortic aneurysm. Motion limits assessment for adenopathy, no obvious enlarged lymph  nodes in the abdomen or pelvis. Reproductive: Enlarged prostate gland spanning 5.4 cm. Other: Trace free fluid in the pelvis. Perihepatic ascites in prior exam has resolved. Generalized subcutaneous and flank soft tissue edema. No free air or focal fluid collection. Musculoskeletal: Degenerative change throughout the lumbar spine and both hips. There are no acute or suspicious osseous abnormalities. IMPRESSION: 1. Small bilateral pleural effusions with adjacent basilar atelectasis. Additional compressive atelectasis in the left lower lobe related to elevated hemidiaphragm. 2. Diffusely dilated fluid and contrast filled small bowel loops, suggesting generalized ileus. No obstruction, as enteric contrast reaches the colon. 3. Colonic diverticulosis without diverticulitis. 4. Enlarged prostate gland. Mild bladder wall thickening may be due to chronic bladder outlet obstruction. 5. Enlarged main pulmonary artery suggesting pulmonary arterial hypertension. 6. Aortic atherosclerosis and coronary artery calcifications. 7. Generalized body wall edema. Trace free fluid/ascites in the pelvis. Aortic Atherosclerosis (ICD10-I70.0). Electronically Signed   By: Keith Rake M.D.   On: 01/26/2021 19:46    Medications / Allergies: per chart  Antibiotics: Anti-infectives (From admission, onward)   Start     Dose/Rate Route Frequency Ordered Stop   01/20/21 1830  vancomycin (VANCOREADY) IVPB 2000 mg/400 mL        2,000 mg 200 mL/hr over 120 Minutes Intravenous  Once 01/20/21 1738 01/20/21 2005   01/20/21 1737  vancomycin variable dose per unstable renal function (pharmacist dosing)  Status:  Discontinued         Does not apply See admin instructions 01/20/21 1738 01/22/21 0729   01/20/21 1400  cefTRIAXone (ROCEPHIN) 2 g in sodium chloride 0.9 % 100 mL IVPB        2 g 200 mL/hr over 30 Minutes Intravenous Every 24 hours 01/20/21 1347 01/25/21 1359   01/16/21 2000  vancomycin (VANCOREADY) IVPB 1250 mg/250 mL        Note to Pharmacy: Per pharmacy   1,250 mg 166.7 mL/hr over 90 Minutes Intravenous Every 12 hours 01/16/21 1329 01/16/21 2155   01/16/21 0600  vancomycin (VANCOREADY) IVPB 1500 mg/300 mL        1,500 mg 150 mL/hr over 120 Minutes Intravenous On call to O.R. 01/15/21 0839 01/16/21 1000   01/09/21 0600  vancomycin (VANCOREADY) IVPB 1500 mg/300 mL        1,500 mg 150 mL/hr over 120 Minutes Intravenous On call to O.R. 01/08/21 0806 01/10/21 0559   01/09/21 0600  gentamicin (GARAMYCIN) 410 mg in dextrose 5 % 100 mL IVPB        5 mg/kg  82.8 kg (Adjusted) 220.5 mL/hr over 30 Minutes Intravenous On call to O.R. 01/08/21 0813 01/10/21 0559        Note: Portions of this report may have been transcribed using voice recognition software. Every effort was made to ensure accuracy; however, inadvertent computerized transcription errors may be present.   Any transcriptional errors that result from this process are unintentional.    Adin Hector, MD, FACS, MASCRS  Esophageal, Gastrointestinal & Colorectal Surgery Robotic and Minimally Invasive Surgery [facility] [address] [place], [place] [address] [phone] Fax [phone] Main/Paging  CONTACT INFORMATION: Weekday (9AM-5PM) concerns: Call [facility] main office at [phone] Weeknight (5PM-9AM) or Weekend/Holiday concerns: Check [email or web address] for General Surgery [facility] coverage (Please, do not use  SecureChat as it is not reliable communication to operating surgeons for immediate patient care)      01/27/2021  10:19 AM

## 2021-01-27 NOTE — Progress Notes (Signed)
RN checked residuals at 1200 and got back 50 cc and refed.  RN checked residuals at 1800 and got back 40 cc and refed.

## 2021-01-27 NOTE — Progress Notes (Signed)
Physical Therapy Treatment Patient Details Name: [name] MRN: [id number] DOB: Mar 10, 1946 Today's Date: 01/27/2021    History of Present Illness Pt is a 75 year old male s/p left TKA on 01/16/21. Transferred to ICU on 5/15 due to respiratory failure, ileus, kidney failure    PT Comments    Pt assisted with transferring to [facility] and then recliner.  Pt fatigues very quickly.  Pt reports he ate lunch today and requested to use BSC and had small amount of loose dark stool (RN notified and nurse tech in room assisting).  Pt with increased LOS and plan is for SNF so decreased frequency today however will attempt to follow PT/OT order request for at least switching days, so pt still gets mobility each day.   Follow Up Recommendations  Follow surgeon's recommendation for DC plan and follow-up therapies;SNF     Equipment Recommendations  Rolling walker with 5" wheels;Wheelchair (measurements PT)    Recommendations for Other Services       Precautions / Restrictions Precautions Precautions: Fall;Knee Precaution Comments: NG tube, monitor vitals Restrictions LLE Weight Bearing: Weight bearing as tolerated    Mobility  Bed Mobility Overal bed mobility: Needs Assistance Bed Mobility: Supine to Sit     Supine to sit: Mod assist;+2 for safety/equipment;HOB elevated     General bed mobility comments: assist for trunk upright, pt able to assist with bringing LEs over EOB    Transfers Overall transfer level: Needs assistance Equipment used: Rolling walker (2 wheeled) Transfers: Sit to/from Stand Sit to Stand: Mod assist;From elevated surface;+2 physical assistance Stand pivot transfers: Mod assist;+2 physical assistance       General transfer comment: multimodal cues for technique, assist to rise and steady, cues for weight shifting and hand placement, assisted to [facility] per pt request; pt had small loose stool; pt assistd with pericare and then switched out BSC for recliner as pt  fatigued quickly; HR up to 120s, SPO2 93-95% on 2L O2 [place]  Ambulation/Gait                 Stairs             Wheelchair Mobility    Modified Rankin (Stroke Patients Only)       Balance Overall balance assessment: Needs assistance         Standing balance support: Bilateral upper extremity supported;During functional activity Standing balance-Leahy Scale: Poor Standing balance comment: reliant on support of RW and therapists                            Cognition Arousal/Alertness: Awake/alert Behavior During Therapy: WFL for tasks assessed/performed Overall Cognitive Status: Within Functional Limits for tasks assessed                                 General Comments: difficult to understand at times however respond appropriately to questions and cues      Exercises      General Comments        Pertinent Vitals/Pain Pain Assessment: Faces Faces Pain Scale: Hurts a little bit Pain Location: abdominal distention Pain Descriptors / Indicators: Discomfort Pain Intervention(s): Repositioned;Monitored during session    Home Living                      Prior Function  PT Goals (current goals can now be found in the care plan section) Progress towards PT goals: Progressing toward goals    Frequency    Min 3X/week      PT Plan Frequency needs to be updated    Co-evaluation              AM-PAC PT "6 Clicks" Mobility   Outcome Measure  Help needed turning from your back to your side while in a flat bed without using bedrails?: A Lot Help needed moving from lying on your back to sitting on the side of a flat bed without using bedrails?: A Lot Help needed moving to and from a bed to a chair (including a wheelchair)?: A Lot Help needed standing up from a chair using your arms (e.g., wheelchair or bedside chair)?: A Lot Help needed to walk in hospital room?: A Lot Help needed climbing 3-5 steps  with a railing? : Total 6 Click Score: 11    End of Session Equipment Utilized During Treatment: Gait belt Activity Tolerance: Patient limited by fatigue Patient left: in chair;with call bell/phone within reach;with chair alarm set;with family/visitor present Nurse Communication: Mobility status PT Visit Diagnosis: Other abnormalities of gait and mobility (R26.89)     Time: 0762-2633 PT Time Calculation (min) (ACUTE ONLY): 30 min  Charges:  $Therapeutic Activity: 23-37 mins          Jannette Spanner PT, DPT Acute Rehabilitation Services Pager: [phone] Office: [phone]    York Ram E 01/27/2021, 3:31 PM

## 2021-01-27 NOTE — Progress Notes (Addendum)
[facility] Progress Note  CC:  No complaints today  Assessment / Plan: Left TKA 01/16/21 with postoperative course complicated by respiratory failure and pneumonia, ileus requiring NG tube placement 01/20/21, afib with RVR requiring anticoagulation, and subsequent acute GI bleeding thought to be due to NG tube trauma with associated progressive anemia. NG tube output was coffee ground appearing 01/25/21. Bright red blood in the NG tube yesterday when heparin was resumed with associated progressive anemia. Hemoglobin improved following 2 units of PRBCs. Seen by General Surgery yesterday. CT yesterday showed no bowel obstruction.  NG tube output is now bilious since discontinuation of the heparin. On exam this morning, the ileus appears to be improving.   Recommendations: - Continue to hold heparin if okay with cardiology - Continue NG clamping trial - Agree with trial of suppositories - Clear liquids as tolerated - Continue PPI IV  - Serial hgb/hct with transfusion as indicated    Subjective: Denies abdominal pain or nausea. Bowel movements overnight. Tolerating clear liquid diet. No family present at the time of my evaluation.   Objective:  Vital signs in last 24 hours: Temp:  [97.2 F (36.2 C)-100 F (37.8 C)] 98.8 F (37.1 C) (05/22 0716) Pulse Rate:  [87-133] 90 (05/22 0100) Resp:  [21-59] 25 (05/22 0100) BP: (110-174)/(34-141) 141/62 (05/22 0100) SpO2:  [80 %-99 %] 94 % (05/22 0100) Last BM Date: 01/26/21 General:   Alert, in NAD. NG tube present. Output in the tube and the canister is bilious. There is no blood.  Heart:  Regular rate and rhythm; no murmurs Pulm: Clear anteriorly; no wheezing Abdomen:  Protuberent but soft. Tympanic but bowel sounds are present. Nontender. No rebound or guarding.  Intake/Output from previous day: 05/21 0701 - 05/22 0700 In: 3802.8 [I.V.:2076.8; Blood:649; NG/GT:270; IV Piggyback:806.9] Out: 1400 [Urine:700; Emesis/NG  output:700]  Lab Results: Recent Labs    01/25/21 0525 01/25/21 1224 01/26/21 0427 01/26/21 1208 01/26/21 2300 01/27/21 0541  WBC 18.0*  --  17.3*  --   --  17.4*  HGB 9.4*   < > 7.5* 6.8* 7.8* 7.9*  HCT 28.8*   < > 23.5* 21.3* 23.9* 25.4*  PLT 189  --  204  --   --  178   < > = values in this interval not displayed.   BMET Recent Labs    01/25/21 0525 01/26/21 0427 01/27/21 0509  NA 144 143 144  K 3.4* 3.4* 3.9  CL 108 106 111  CO2 30 26 22   GLUCOSE 208* 340* 333*  BUN 65* 72* 83*  CREATININE 1.30* 1.30* 1.29*  CALCIUM 8.5* 8.5* 8.3*    CT HEAD WO CONTRAST  Result Date: 01/26/2021 CLINICAL DATA:  Nonspecific dizziness.  Vision changes for 5 days. EXAM: CT HEAD WITHOUT CONTRAST TECHNIQUE: Contiguous axial images were obtained from the base of the skull through the vertex without intravenous contrast. COMPARISON:  None. FINDINGS: Brain: Dental artifact causes streak through the skull base. No intracranial hemorrhage, mass effect, or midline shift. No hydrocephalus. The basilar cisterns are patent. No evidence of territorial infarct or acute ischemia. No extra-axial or intracranial fluid collection. Vascular: Atherosclerosis of skullbase vasculature without hyperdense vessel or abnormal calcification. Skull: No fracture or focal lesion. Sinuses/Orbits: Nasogastric tube in place.  No acute findings. Other: None. IMPRESSION: No acute intracranial abnormality. Electronically Signed   By: Keith Rake M.D.   On: 01/26/2021 19:20   DG CHEST PORT 1 VIEW  Result Date: 01/27/2021 CLINICAL DATA:  Shortness of breath. EXAM: PORTABLE CHEST 1 VIEW.  AP portable semi erect. COMPARISON:  Chest x-ray 01/24/2021, CT chest 01/26/2021 FINDINGS: Right PICC with tip overlying the right atrium. Enteric tube coursing below the hemidiaphragm with tip and side port collimated off view. The tube is noted to loop overlying the gastric lumen. The heart size and mediastinal contours are unchanged.  Calcifications of the aorta. Left base streaky airspace opacity likely representing atelectasis. No pulmonary edema. Interval improvement/resolution of a non existing versus trace left pleural effusion. No right pleural effusion. No pneumothorax. No acute osseous abnormality. IMPRESSION: 1. Streaky airspace opacity at the left base with elevated left hemidiaphragm likely representing atelectasis. 2. Improved/possible complete resolution of a left pleural effusion. 3. Enteric tube courses below the hemidiaphragm with tip and side port collimated off view. Enteric tube is noted to be coiled overlying the gastric lumen. Consider retracting by 5 cm. Electronically Signed   By: Iven Finn M.D.   On: 01/27/2021 06:52   DG Abd 2 Views  Result Date: 01/26/2021 CLINICAL DATA:  Abdominal distension. EXAM: ABDOMEN - 2 VIEW COMPARISON:  01/25/2021 FINDINGS: An NG tube is again noted with tip overlying the mid stomach. Gaseous distension of colon and small bowel are not significantly changed. Oral contrast administered on 01/24/2021 appears to lie within the cecum. IMPRESSION: Unchanged gaseous distension of colon and small bowel. Oral contrast administered 2 days ago lies within the cecum. Electronically Signed   By: Margarette Canada M.D.   On: 01/26/2021 10:41   DG Abd Portable 1V-Small Bowel Obstruction Protocol-24 hr delay  Result Date: 01/25/2021 CLINICAL DATA:  A 24 hour delay film for small-bowel obstruction. EXAM: PORTABLE ABDOMEN - 1 VIEW COMPARISON:  Film from the previous day. FINDINGS: Gastric catheter is again noted coiled within the stomach. Persistent small bowel dilatation is noted although colonic gas is again seen. By history contrast was administered 24 hours previous and contrast is noted within the distal small bowel predominately within the right lower quadrant. No significant colonic contrast is noted. No free air is seen. IMPRESSION: Persistent and stable small bowel dilatation when compared with  the prior exam. Previously administered contrast material lies in the distal aspect of the small bowel. Electronically Signed   By: Inez Catalina M.D.   On: 01/25/2021 15:19   CT CHEST ABDOMEN PELVIS WO CONTRAST  Result Date: 01/26/2021 CLINICAL DATA:  Abdominal distension.  Anemia. EXAM: CT CHEST, ABDOMEN AND PELVIS WITHOUT CONTRAST TECHNIQUE: Multidetector CT imaging of the chest, abdomen and pelvis was performed following the standard protocol without IV contrast. COMPARISON:  Recent chest and abdominal radiographs. Abdominopelvic CT 01/23/2021 FINDINGS: CT CHEST FINDINGS Cardiovascular: Right upper extremity PICC tip at the atrial caval junction. Aortic atherosclerosis and tortuosity without aneurysm. There is dilatation of the main pulmonary artery at 4.3 cm. Mild cardiomegaly. Coronary artery calcifications. No pericardial effusion. Mediastinum/Nodes: Small mediastinal lymph nodes are not enlarged by size criteria. Limited assessment for hilar adenopathy on this noncontrast exam. Nasogastric tube in the esophagus, which is mildly patulous despite decompression. No suspicious thyroid nodule. Lungs/Pleura: Small bilateral pleural effusions. Associated basilar atelectasis. Additional compressive atelectasis in the left lower lobe related to elevated hemidiaphragm. Motion artifact limits assessment of pulmonary parenchyma. Linear platelike atelectasis in the lingula. No septal thickening or findings of pulmonary edema. No pneumothorax. Musculoskeletal: Diffuse degenerative change throughout the thoracic spine. No acute osseous abnormalities are seen. No chest wall soft tissue abnormality. CT ABDOMEN PELVIS FINDINGS Hepatobiliary: Motion artifact through the liver  and gallbladder limiting assessment. No focal hepatic abnormality allowing for motion and lack of IV contrast. Liver is prominent size spanning 20 cm cranial caudal. No calcified gallstones. Pancreas: Motion artifact through the pancreas. No obvious  ductal dilatation or inflammation. Spleen: Upper normal in size spanning 13.1 cm. No focal abnormality allowing for motion and lack of IV contrast. Splenule inferiorly. Adrenals/Urinary Tract: No adrenal nodule. Motion artifact through the kidneys limits detailed assessment. There is no hydronephrosis. Bilateral renal cysts. More detailed assessment is limited. The urinary bladder is minimally distended. Mild bladder wall thickening. Stomach/Bowel: Enteric tube tip in the stomach. Stomach is decompressed. Small bowel loops are diffusely dilated measuring up to 3.9 cm, fluid and contrast filled. No obstruction, as enteric contrast reaches the colon. No evidence of small bowel inflammation. Appendix is not well seen on this current exam due to motion. Administered enteric contrast reaches the sigmoid colon. Sigmoid colonic diverticulosis without diverticulitis. Sigmoid colonic redundancy. Small volume of colonic stool. No evidence of colonic inflammation. Vascular/Lymphatic: Moderate aortic atherosclerosis. No aortic aneurysm. Motion limits assessment for adenopathy, no obvious enlarged lymph nodes in the abdomen or pelvis. Reproductive: Enlarged prostate gland spanning 5.4 cm. Other: Trace free fluid in the pelvis. Perihepatic ascites in prior exam has resolved. Generalized subcutaneous and flank soft tissue edema. No free air or focal fluid collection. Musculoskeletal: Degenerative change throughout the lumbar spine and both hips. There are no acute or suspicious osseous abnormalities. IMPRESSION: 1. Small bilateral pleural effusions with adjacent basilar atelectasis. Additional compressive atelectasis in the left lower lobe related to elevated hemidiaphragm. 2. Diffusely dilated fluid and contrast filled small bowel loops, suggesting generalized ileus. No obstruction, as enteric contrast reaches the colon. 3. Colonic diverticulosis without diverticulitis. 4. Enlarged prostate gland. Mild bladder wall thickening may  be due to chronic bladder outlet obstruction. 5. Enlarged main pulmonary artery suggesting pulmonary arterial hypertension. 6. Aortic atherosclerosis and coronary artery calcifications. 7. Generalized body wall edema. Trace free fluid/ascites in the pelvis. Aortic Atherosclerosis (ICD10-I70.0). Electronically Signed   By: Keith Rake M.D.   On: 01/26/2021 19:46      LOS: 11 days   [place]  01/27/2021, 8:34 AM

## 2021-01-28 DIAGNOSIS — R0602 Shortness of breath: Secondary | ICD-10-CM | POA: Diagnosis not present

## 2021-01-28 DIAGNOSIS — I480 Paroxysmal atrial fibrillation: Secondary | ICD-10-CM | POA: Diagnosis not present

## 2021-01-28 DIAGNOSIS — D62 Acute posthemorrhagic anemia: Secondary | ICD-10-CM

## 2021-01-28 DIAGNOSIS — K567 Ileus, unspecified: Secondary | ICD-10-CM | POA: Diagnosis not present

## 2021-01-28 LAB — DIFFERENTIAL
Band Neutrophils: 4 %
Basophils Absolute: 0.1 10*3/uL (ref 0.0–0.1)
Basophils Relative: 1 %
Blasts: NONE SEEN %
Eosinophils Absolute: 0.4 10*3/uL (ref 0.0–0.5)
Eosinophils Relative: 5 %
Lymphocytes Relative: 4 %
Lymphs Abs: 0.9 10*3/uL (ref 0.7–4.0)
Metamyelocytes Relative: NONE SEEN %
Monocytes Absolute: 0.6 10*3/uL (ref 0.1–1.0)
Monocytes Relative: 4 %
Myelocytes: NONE SEEN %
Neutro Abs: 11.6 10*3/uL — ABNORMAL HIGH (ref 1.7–7.7)
Neutrophils Relative %: 85 %
Promyelocytes Relative: NONE SEEN %
RBC Morphology: NORMAL
WBC Morphology: NORMAL
nRBC: NONE SEEN /100 WBC

## 2021-01-28 LAB — HEMOGLOBIN AND HEMATOCRIT, BLOOD
HCT: 25.4 % — ABNORMAL LOW (ref 39.0–52.0)
HCT: 25.6 % — ABNORMAL LOW (ref 39.0–52.0)
Hemoglobin: 8.1 g/dL — ABNORMAL LOW (ref 13.0–17.0)
Hemoglobin: 8.1 g/dL — ABNORMAL LOW (ref 13.0–17.0)

## 2021-01-28 LAB — GLUCOSE, CAPILLARY
Glucose-Capillary: 153 mg/dL — ABNORMAL HIGH (ref 70–99)
Glucose-Capillary: 161 mg/dL — ABNORMAL HIGH (ref 70–99)
Glucose-Capillary: 162 mg/dL — ABNORMAL HIGH (ref 70–99)
Glucose-Capillary: 162 mg/dL — ABNORMAL HIGH (ref 70–99)
Glucose-Capillary: 165 mg/dL — ABNORMAL HIGH (ref 70–99)
Glucose-Capillary: 169 mg/dL — ABNORMAL HIGH (ref 70–99)

## 2021-01-28 LAB — COMPREHENSIVE METABOLIC PANEL
ALT: 49 U/L — ABNORMAL HIGH (ref 0–44)
AST: 41 U/L (ref 15–41)
Albumin: 2.3 g/dL — ABNORMAL LOW (ref 3.5–5.0)
Alkaline Phosphatase: 46 U/L (ref 38–126)
Anion gap: 5 (ref 5–15)
BUN: 95 mg/dL — ABNORMAL HIGH (ref 8–23)
CO2: 23 mmol/L (ref 22–32)
Calcium: 8.5 mg/dL — ABNORMAL LOW (ref 8.9–10.3)
Chloride: 116 mmol/L — ABNORMAL HIGH (ref 98–111)
Creatinine, Ser: 1.58 mg/dL — ABNORMAL HIGH (ref 0.61–1.24)
GFR, Estimated: 45 mL/min — ABNORMAL LOW (ref >60)
Glucose, Bld: 177 mg/dL — ABNORMAL HIGH (ref 70–99)
Potassium: 4.5 mmol/L (ref 3.5–5.1)
Sodium: 144 mmol/L (ref 135–145)
Total Bilirubin: 0.8 mg/dL (ref 0.3–1.2)
Total Protein: 5 g/dL — ABNORMAL LOW (ref 6.5–8.1)

## 2021-01-28 LAB — CBC
HCT: 22.5 % — ABNORMAL LOW (ref 39.0–52.0)
Hemoglobin: 7.1 g/dL — ABNORMAL LOW (ref 13.0–17.0)
MCH: 30.5 pg (ref 26.0–34.0)
MCHC: 31.6 g/dL (ref 30.0–36.0)
MCV: 96.6 fL (ref 80.0–100.0)
Platelets: 159 10*3/uL (ref 150–400)
RBC: 2.33 MIL/uL — ABNORMAL LOW (ref 4.22–5.81)
RDW: 19.6 % — ABNORMAL HIGH (ref 11.5–15.5)
WBC: 14.3 10*3/uL — ABNORMAL HIGH (ref 4.0–10.5)
nRBC: 1 % — ABNORMAL HIGH (ref 0.0–0.2)

## 2021-01-28 LAB — PREPARE RBC (CROSSMATCH)

## 2021-01-28 LAB — MAGNESIUM: Magnesium: 2.2 mg/dL (ref 1.7–2.4)

## 2021-01-28 LAB — PHOSPHORUS: Phosphorus: 3.7 mg/dL (ref 2.5–4.6)

## 2021-01-28 LAB — PREALBUMIN: Prealbumin: 13.5 mg/dL — ABNORMAL LOW (ref 18–38)

## 2021-01-28 LAB — TRIGLYCERIDES: Triglycerides: 123 mg/dL (ref <150)

## 2021-01-28 MED ORDER — METOPROLOL SUCCINATE ER 25 MG PO TB24
12.5000 mg | ORAL_TABLET | Freq: Every day | ORAL | Status: DC
  Administered 2021-01-28 – 2021-02-02 (×6): 12.5 mg via ORAL
  Filled 2021-01-28 (×6): qty 1

## 2021-01-28 MED ORDER — ALLOPURINOL 100 MG PO TABS
100.0000 mg | ORAL_TABLET | Freq: Every day | ORAL | Status: DC
  Administered 2021-01-28 – 2021-02-05 (×8): 100 mg via ORAL
  Filled 2021-01-28 (×8): qty 1

## 2021-01-28 MED ORDER — SIMETHICONE 40 MG/0.6ML PO SUSP
80.0000 mg | Freq: Four times a day (QID) | ORAL | Status: AC
  Administered 2021-01-28 – 2021-01-29 (×5): 80 mg via ORAL
  Filled 2021-01-28 (×5): qty 1.2

## 2021-01-28 MED ORDER — SODIUM CHLORIDE 0.9% IV SOLUTION: Freq: Once | INTRAVENOUS | Status: AC

## 2021-01-28 MED ORDER — FUROSEMIDE 10 MG/ML IJ SOLN
40.0000 mg | Freq: Once | INTRAMUSCULAR | Status: AC
  Administered 2021-01-28: 40 mg via INTRAVENOUS
  Filled 2021-01-28: qty 4

## 2021-01-28 MED ORDER — TRAVASOL 10 % IV SOLN
INTRAVENOUS | Status: AC
  Filled 2021-01-28: qty 1056

## 2021-01-28 MED ORDER — TRIMETHOBENZAMIDE HCL 100 MG/ML IM SOLN
200.0000 mg | Freq: Four times a day (QID) | INTRAMUSCULAR | Status: DC | PRN
  Filled 2021-01-28: qty 2

## 2021-01-28 NOTE — Progress Notes (Signed)
RN checked gastric residuals @ 0000, measured 25 cc and refed  RN checked gastric residuals @ 0600, measured 13 cc and refed

## 2021-01-28 NOTE — Progress Notes (Signed)
Subjective: 12 Days Post-Op Procedure(s) (LRB): TOTAL KNEE ARTHROPLASTY (Left) Patient reports pain as 2 on 0-10 scale.   Denies CP or SOB.  Voiding without difficulty. Positive flatus. Objective: Vital signs in last 24 hours: Temp:  [98.1 F (36.7 C)-99.1 F (37.3 C)] 98.6 F (37 C) (05/23 1200) Pulse Rate:  [83-96] 91 (05/23 1040) Resp:  [19-37] 26 (05/23 1040) BP: (103-161)/(40-60) 149/40 (05/23 1040) SpO2:  [94 %-98 %] 94 % (05/23 1025)  Intake/Output from previous day: 05/22 0701 - 05/23 0700 In: 2767.3 [I.V.:2767.3] Out: 1450 [Urine:1250; Emesis/NG output:200] Intake/Output this shift: Total I/O In: 315 [Blood:315] Out: -   Recent Labs    01/26/21 2300 01/27/21 0541 01/27/21 1154 01/27/21 1842 01/28/21 0422  HGB 7.8* 7.9* 7.8* 7.3* 7.1*   Recent Labs    01/27/21 0541 01/27/21 1154 01/27/21 1842 01/28/21 0422  WBC 17.4*  --   --  14.3*  RBC 2.65*  --   --  2.33*  HCT 25.4*   < > 23.3* 22.5*  PLT 178  --   --  159   < > = values in this interval not displayed.   Recent Labs    01/27/21 0509 01/28/21 0422  NA 144 144  K 3.9 4.5  CL 111 116*  CO2 22 23  BUN 83* 95*  CREATININE 1.29* 1.58*  GLUCOSE 333* 177*  CALCIUM 8.3* 8.5*   No results for input(s): LABPT, INR in the last 72 hours.  Neurologically intact ABD soft Dorsiflexion/Plantar flexion intact Incision: scant drainage Compartment soft  Assessment/Plan:  12 Days Post-Op Procedure(s) (LRB): TOTAL KNEE ARTHROPLASTY (Left) Advance diet Up with therapy Discharge to SNF Improving Check h/h  Principal Problem:   Ileus ([place]) Active Problems:   Type 2 diabetes mellitus without complication, without long-term current use of insulin (HCC)   Essential hypertension   Obesity (BMI 30-39.9)   CAD (coronary artery disease)   Pulmonary HTN (HCC)   S/P TKR (total knee replacement) using cement   Acute respiratory distress   AKI (acute kidney injury) ([place])   Acute renal failure (HCC)    Benign prostatic hyperplasia   Obstructive sleep apnea treated with continuous positive airway pressure (CPAP)   Hypotension   GERD (gastroesophageal reflux disease)   Protein-calorie malnutrition, moderate (HCC)   Gastrointestinal bleed   Atrial fibrillation (HCC)   Acute blood loss anemia   History of gastritis   Irritable bowel syndrome with intermittent diarrhea   SOB (shortness of breath)      [name] 01/28/2021, @NOW 

## 2021-01-28 NOTE — Progress Notes (Signed)
Progress Note  12 Days Post-Op  Subjective: CC: he states he is feeling about the same from yesterday. Per patient and family bedside (wife and son) his distension is about the same. He denies nausea or emesis on clears and reports had a BM yesterday.    Objective: Vital signs in last 24 hours: Temp:  [98.1 F (36.7 C)-99 F (37.2 C)] 98.1 F (36.7 C) (05/23 0400) Pulse Rate:  [85-96] 90 (05/23 0600) Resp:  [25-37] 27 (05/23 0600) BP: (103-146)/(40-60) 140/56 (05/23 0600) SpO2:  [94 %-98 %] 95 % (05/23 0600) Last BM Date: 01/27/21  Intake/Output from previous day: 05/22 0701 - 05/23 0700 In: 2767.3 [I.V.:2767.3] Out: 1450 [Urine:1250; Emesis/NG output:200] Intake/Output this shift: No intake/output data recorded.  PE: General: pleasant ill appearing male who is sitting up in bed in NAD HEENT: head is normocephalic, atraumatic. NGT in place clamped Heart: regular, rate, and rhythm. Palpable radial and pedal pulses bilaterally Lungs: CTAB, no wheezes, rhonchi, or rales noted.  Respiratory effort nonlabored on supplemental O2 via [place] Abd: distended and with lateral subcutaneous edema. Very mildly tender to palpation diffusely. +BS MS: edema in bilateral upper and lower extremities. Skin: warm and dry  Neuro: Cranial nerves 2-12 grossly intact, sensation is normal throughout Psych: A&Ox3 with an appropriate affect.    Lab Results:  Recent Labs    01/27/21 0541 01/27/21 1154 01/27/21 1842 01/28/21 0422  WBC 17.4*  --   --  14.3*  HGB 7.9*   < > 7.3* 7.1*  HCT 25.4*   < > 23.3* 22.5*  PLT 178  --   --  159   < > = values in this interval not displayed.   BMET Recent Labs    01/27/21 0509 01/28/21 0422  NA 144 144  K 3.9 4.5  CL 111 116*  CO2 22 23  GLUCOSE 333* 177*  BUN 83* 95*  CREATININE 1.29* 1.58*  CALCIUM 8.3* 8.5*   PT/INR No results for input(s): LABPROT, INR in the last 72 hours. CMP     Component Value Date/Time   NA 144 01/28/2021 0422    NA 141 10/06/2016 0830   K 4.5 01/28/2021 0422   CL 116 (H) 01/28/2021 0422   CO2 23 01/28/2021 0422   GLUCOSE 177 (H) 01/28/2021 0422   BUN 95 (H) 01/28/2021 0422   BUN 29 (H) 10/06/2016 0830   CREATININE 1.58 (H) 01/28/2021 0422   CALCIUM 8.5 (L) 01/28/2021 0422   PROT 5.0 (L) 01/28/2021 0422   PROT 7.4 09/22/2016 0913   ALBUMIN 2.3 (L) 01/28/2021 0422   ALBUMIN 4.7 09/22/2016 0913   AST 41 01/28/2021 0422   ALT 49 (H) 01/28/2021 0422   ALKPHOS 46 01/28/2021 0422   BILITOT 0.8 01/28/2021 0422   BILITOT 1.0 09/22/2016 0913   GFRNONAA 45 (L) 01/28/2021 0422   GFRAA 59 (L) 04/29/2018 1012   Lipase  No results found for: LIPASE     Studies/Results: CT HEAD WO CONTRAST  Result Date: 01/26/2021 CLINICAL DATA:  Nonspecific dizziness.  Vision changes for 5 days. EXAM: CT HEAD WITHOUT CONTRAST TECHNIQUE: Contiguous axial images were obtained from the base of the skull through the vertex without intravenous contrast. COMPARISON:  None. FINDINGS: Brain: Dental artifact causes streak through the skull base. No intracranial hemorrhage, mass effect, or midline shift. No hydrocephalus. The basilar cisterns are patent. No evidence of territorial infarct or acute ischemia. No extra-axial or intracranial fluid collection. Vascular: Atherosclerosis of skullbase vasculature without hyperdense  vessel or abnormal calcification. Skull: No fracture or focal lesion. Sinuses/Orbits: Nasogastric tube in place.  No acute findings. Other: None. IMPRESSION: No acute intracranial abnormality. Electronically Signed   By: Keith Rake M.D.   On: 01/26/2021 19:20   DG CHEST PORT 1 VIEW  Result Date: 01/27/2021 CLINICAL DATA:  Shortness of breath. EXAM: PORTABLE CHEST 1 VIEW.  AP portable semi erect. COMPARISON:  Chest x-ray 01/24/2021, CT chest 01/26/2021 FINDINGS: Right PICC with tip overlying the right atrium. Enteric tube coursing below the hemidiaphragm with tip and side port collimated off view. The tube  is noted to loop overlying the gastric lumen. The heart size and mediastinal contours are unchanged. Calcifications of the aorta. Left base streaky airspace opacity likely representing atelectasis. No pulmonary edema. Interval improvement/resolution of a non existing versus trace left pleural effusion. No right pleural effusion. No pneumothorax. No acute osseous abnormality. IMPRESSION: 1. Streaky airspace opacity at the left base with elevated left hemidiaphragm likely representing atelectasis. 2. Improved/possible complete resolution of a left pleural effusion. 3. Enteric tube courses below the hemidiaphragm with tip and side port collimated off view. Enteric tube is noted to be coiled overlying the gastric lumen. Consider retracting by 5 cm. Electronically Signed   By: Iven Finn M.D.   On: 01/27/2021 06:52   DG Abd Portable 2V  Result Date: 01/27/2021 CLINICAL DATA:  Abdominal distension EXAM: PORTABLE ABDOMEN - 2 VIEW COMPARISON:  Jan 26, 2021 FINDINGS: The NG tube terminates in the stomach. Air-filled loops of large and small bowel are identified. No free air, portal venous gas, or pneumatosis. IMPRESSION: Dilated air-filled loops of small bowel are again identified, with decreased distension in the interval. Air throughout the colon again identified. Findings suggest ileus versus partial small-bowel obstruction. Recommend attention on follow-up. Electronically Signed   By: Dorise Bullion III M.D   On: 01/27/2021 11:55   CT CHEST ABDOMEN PELVIS WO CONTRAST  Result Date: 01/26/2021 CLINICAL DATA:  Abdominal distension.  Anemia. EXAM: CT CHEST, ABDOMEN AND PELVIS WITHOUT CONTRAST TECHNIQUE: Multidetector CT imaging of the chest, abdomen and pelvis was performed following the standard protocol without IV contrast. COMPARISON:  Recent chest and abdominal radiographs. Abdominopelvic CT 01/23/2021 FINDINGS: CT CHEST FINDINGS Cardiovascular: Right upper extremity PICC tip at the atrial caval junction.  Aortic atherosclerosis and tortuosity without aneurysm. There is dilatation of the main pulmonary artery at 4.3 cm. Mild cardiomegaly. Coronary artery calcifications. No pericardial effusion. Mediastinum/Nodes: Small mediastinal lymph nodes are not enlarged by size criteria. Limited assessment for hilar adenopathy on this noncontrast exam. Nasogastric tube in the esophagus, which is mildly patulous despite decompression. No suspicious thyroid nodule. Lungs/Pleura: Small bilateral pleural effusions. Associated basilar atelectasis. Additional compressive atelectasis in the left lower lobe related to elevated hemidiaphragm. Motion artifact limits assessment of pulmonary parenchyma. Linear platelike atelectasis in the lingula. No septal thickening or findings of pulmonary edema. No pneumothorax. Musculoskeletal: Diffuse degenerative change throughout the thoracic spine. No acute osseous abnormalities are seen. No chest wall soft tissue abnormality. CT ABDOMEN PELVIS FINDINGS Hepatobiliary: Motion artifact through the liver and gallbladder limiting assessment. No focal hepatic abnormality allowing for motion and lack of IV contrast. Liver is prominent size spanning 20 cm cranial caudal. No calcified gallstones. Pancreas: Motion artifact through the pancreas. No obvious ductal dilatation or inflammation. Spleen: Upper normal in size spanning 13.1 cm. No focal abnormality allowing for motion and lack of IV contrast. Splenule inferiorly. Adrenals/Urinary Tract: No adrenal nodule. Motion artifact through the kidneys limits detailed  assessment. There is no hydronephrosis. Bilateral renal cysts. More detailed assessment is limited. The urinary bladder is minimally distended. Mild bladder wall thickening. Stomach/Bowel: Enteric tube tip in the stomach. Stomach is decompressed. Small bowel loops are diffusely dilated measuring up to 3.9 cm, fluid and contrast filled. No obstruction, as enteric contrast reaches the colon. No  evidence of small bowel inflammation. Appendix is not well seen on this current exam due to motion. Administered enteric contrast reaches the sigmoid colon. Sigmoid colonic diverticulosis without diverticulitis. Sigmoid colonic redundancy. Small volume of colonic stool. No evidence of colonic inflammation. Vascular/Lymphatic: Moderate aortic atherosclerosis. No aortic aneurysm. Motion limits assessment for adenopathy, no obvious enlarged lymph nodes in the abdomen or pelvis. Reproductive: Enlarged prostate gland spanning 5.4 cm. Other: Trace free fluid in the pelvis. Perihepatic ascites in prior exam has resolved. Generalized subcutaneous and flank soft tissue edema. No free air or focal fluid collection. Musculoskeletal: Degenerative change throughout the lumbar spine and both hips. There are no acute or suspicious osseous abnormalities. IMPRESSION: 1. Small bilateral pleural effusions with adjacent basilar atelectasis. Additional compressive atelectasis in the left lower lobe related to elevated hemidiaphragm. 2. Diffusely dilated fluid and contrast filled small bowel loops, suggesting generalized ileus. No obstruction, as enteric contrast reaches the colon. 3. Colonic diverticulosis without diverticulitis. 4. Enlarged prostate gland. Mild bladder wall thickening may be due to chronic bladder outlet obstruction. 5. Enlarged main pulmonary artery suggesting pulmonary arterial hypertension. 6. Aortic atherosclerosis and coronary artery calcifications. 7. Generalized body wall edema. Trace free fluid/ascites in the pelvis. Aortic Atherosclerosis (ICD10-I70.0). Electronically Signed   By: Keith Rake M.D.   On: 01/26/2021 19:46    Anti-infectives: Anti-infectives (From admission, onward)   Start     Dose/Rate Route Frequency Ordered Stop   01/20/21 1830  vancomycin (VANCOREADY) IVPB 2000 mg/400 mL        2,000 mg 200 mL/hr over 120 Minutes Intravenous  Once 01/20/21 1738 01/20/21 2005   01/20/21 1737   vancomycin variable dose per unstable renal function (pharmacist dosing)  Status:  Discontinued         Does not apply See admin instructions 01/20/21 1738 01/22/21 0729   01/20/21 1400  cefTRIAXone (ROCEPHIN) 2 g in sodium chloride 0.9 % 100 mL IVPB        2 g 200 mL/hr over 30 Minutes Intravenous Every 24 hours 01/20/21 1347 01/25/21 1359   01/16/21 2000  vancomycin (VANCOREADY) IVPB 1250 mg/250 mL       Note to Pharmacy: Per pharmacy   1,250 mg 166.7 mL/hr over 90 Minutes Intravenous Every 12 hours 01/16/21 1329 01/16/21 2155   01/16/21 0600  vancomycin (VANCOREADY) IVPB 1500 mg/300 mL        1,500 mg 150 mL/hr over 120 Minutes Intravenous On call to O.R. 01/15/21 0839 01/16/21 1000   01/09/21 0600  vancomycin (VANCOREADY) IVPB 1500 mg/300 mL        1,500 mg 150 mL/hr over 120 Minutes Intravenous On call to O.R. 01/08/21 0806 01/10/21 0559   01/09/21 0600  gentamicin (GARAMYCIN) 410 mg in dextrose 5 % 100 mL IVPB        5 mg/kg  82.8 kg (Adjusted) 220.5 mL/hr over 30 Minutes Intravenous On call to O.R. 01/08/21 0813 01/10/21 0559       Assessment/Plan   Ileus s/p orthopedic surgery - tolerating clears and did well with clamping trials - NGT out. Continue clears today -mobilize as tolerated to help recovery   Concern for upper  GI bleeding gastritis.   Defer to gastroenterology.  Plan switch to more continuous PPIs for now in the hopes of stabilizing hemoglobin in the setting of chronic need for anticoagulation  FEN: clears, TPN VTE prophylaxis: SCDs    LOS: 12 days    Winferd Humphrey, [facility] 01/28/2021, 9:09 AM Please see Amion for pager number during day hours 7:00am-4:30pm

## 2021-01-28 NOTE — Progress Notes (Signed)
Chaplain engaged in an initial visit with [name].  [name] stated he was feeling so much better.  Chaplain offered support and let him know chaplains were praying for him.    Chaplain will follow-up.    01/28/21 1200  Clinical Encounter Type  Visited With Patient  Visit Type Initial

## 2021-01-28 NOTE — Progress Notes (Signed)
PHARMACY - TOTAL PARENTERAL NUTRITION CONSULT NOTE   Indication: Prolonged ileus  Patient Measurements: Height: 5\' 8"  (172.7 cm) Weight: 109.1 kg (240 lb 8.4 oz) IBW/kg (Calculated) : 68.4 TPN AdjBW (KG): 77.4 Body mass index is 36.57 kg/m. 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 Post op course complicated by Afib, aspiration PNA and prolonged ileus. Pharmacy consulted to manage TPN for nutrition support.  Glucose / Insulin: Hx DM2 on only metformin PTA  Serum Glucose with AM labs: 177; CBG range 151-230.    Regular Insulin in TPN:  Added 15 units on 5/21  Lantus 8 qday to 8 q12 on 5/22 (managed by [facility])  Moderate SSI 21 units/24 hrs  Solucortef 100 mg bid dc'd 5/20. Last dose 5/20 @ 5 am  Electrolytes: K up to 4.5 (after repletion), goal >=4; Mg 2.2; Phos 3.7, CoCa 9.86; Cl up to 116, CO2 23 (changed to 1:1 Cl:Co2 yesterday) Renal: Postop AKI w/ SCr peaking at 4 (baseline 1.4) still increased, elevated above BL; BUN inc to 95  Hepatic: LFTs stable WNL; mild bump in tbili resolved; Trig 98 (5/10) Preabumin 10 (5/19), 13.5 (5/23) I/O, mIVF:  IVF NS 10 ml/hr, Amio gtt at 17 ml/hr, Protonix gtt @ 10 ml/hr (5/21-5/24), PRBC (5/23)  I/O +1300 mL; UOP 1250 mL, NG output dec to 200 mL  Relistor x 2 doses; scheduled IV reglan q6h & miralax bid, Bisacodyl daily (5/21 > ) GI Imaging:  - 5/18 CT A/P: ileus vs distal SBO 5/19 KUB: diffuse gaseous bowel distention persists 5/20 KUB: persistent & stable SBO 5/21 KUB: unchanged gaseous distention of colon & SB, contrast from 2 days ago in cecum 5/22 KUB: ordered GI Surgeries / Procedures: none  Central access: CVC; PICC placed 5/19 for TPN TPN start date: 01/23/21  Nutritional Goals (RD recommendations 5/18):  Kcal:  1880-2090 kcal, Protein:  90-100 grams, Fluid:  >/= 2 L/day  Current TPN at goal rate of 80 mL/hr provides 106 g of protein, 1978 kcals per day  Keep K >/= 4 and Mg >/=2 with  ileus  Current Nutrition:  TPN at goal rate Diet: CLD (5/21)  Plan:   Continue TPN @ 76mL/hr at 1800- provides 100% of goals  Electrolytes in TPN:  Na - 4mEq/L  K -65 mEq/L  Ca - 74mEq/L  Mg - 5 mEq/L  Phos - 8mmol/L  Cl:Ac ratio: change to 1:2  Add standard MVI and trace elements to TPN  Continue moderate scale SSI q4h and adjust as needed  Continue insulin 15 units in TPN   Note: Lantus 8 units q12 per TRH  MIVF NS at [facility] per MD  Monitor TPN labs on Mon/Thurs   Gretta Arab PharmD, BCPS Clinical Pharmacist [facility] main pharmacy [phone] 01/28/2021 7:16 AM

## 2021-01-28 NOTE — Progress Notes (Addendum)
Progress Note  Patient Name: [name] Date of Encounter: 01/28/2021  [facility] Cardiologist: Ida Rogue, MD   Subjective   Feels breathing is a little worse today. Uncomfortable with NG tube. Tolerating liquid diet without abdominal pain, nausea, or vomiting. Denies chest pain or palpitations.   Inpatient Medications    Scheduled Meds: . sodium chloride   Intravenous Once  . allopurinol  100 mg Per Tube QHS  . bisacodyl  10 mg Rectal Daily  . Chlorhexidine Gluconate Cloth  6 each Topical Daily  . insulin aspart  0-15 Units Subcutaneous Q4H  . insulin glargine  8 Units Subcutaneous BID  . lip balm  1 application Topical BID  . mouth rinse  15 mL Mouth Rinse BID  . [START ON 01/30/2021] pantoprazole  40 mg Intravenous Q12H  . simethicone  80 mg Per Tube QID  . sodium chloride flush  10-40 mL Intracatheter Q12H   Continuous Infusions: . sodium chloride Stopped (01/24/21 1744)  . amiodarone 30 mg/hr (01/28/21 0604)  . lactated ringers    . pantoprozole (PROTONIX) infusion 8 mg/hr (01/28/21 0656)  . TPN ADULT (ION) 80 mL/hr at 01/28/21 0604   PRN Meds: acetaminophen, alum & mag hydroxide-simeth, diphenhydrAMINE, HYDROmorphone (DILAUDID) injection, lactated ringers, magic mouthwash, menthol-cetylpyridinium, metoprolol tartrate, naphazoline-glycerin, phenol, sodium chloride, sodium chloride flush, trimethobenzamide   Vital Signs    Vitals:   01/28/21 0300 01/28/21 0400 01/28/21 0500 01/28/21 0600  BP: 128/60 (!) 146/57 (!) 136/59 (!) 140/56  Pulse: 85 87 94 90  Resp: (!) 28 (!) 29 (!) 29 (!) 27  Temp:  98.1 F (36.7 C)    TempSrc:  Oral    SpO2: 94% 95% 95% 95%  Weight:      Height:        Intake/Output Summary (Last 24 hours) at 01/28/2021 0909 Last data filed at 01/28/2021 0604 Gross per 24 hour  Intake 2767.27 ml  Output 1250 ml  Net 1517.27 ml   Last 3 Weights 01/23/2021 01/20/2021 01/16/2021  Weight (lbs) 240 lb 8.4 oz 230 lb 2.6 oz 226 lb  Weight  (kg) 109.1 kg 104.4 kg 102.513 kg      Telemetry    Sinus rhythm with rates predominately in the 80s with occasional brief runs of atrial tachycardia - Personally Reviewed  ECG    Sinus rhythm, rate 92 bpm, RBBB, PACs, no STE/D, no significant change from previous  - Personally Reviewed  Physical Exam   GEN: Ill appearing gentleman sitting upright in bed    Neck: No JVD Cardiac: RRR, no murmurs, rubs, or gallops.  Respiratory: mild crackles at lung bases GI: +BS, nontender, mildly distended  MS: 2+ LE edema L>R s/p L TKR with incision site dressing in place C/D/I Neuro:  Nonfocal  Psych: Normal affect   Labs    High Sensitivity Troponin:   Recent Labs  Lab 01/20/21 1220 01/20/21 2131  TROPONINIHS 21* 23*      Chemistry Recent Labs  Lab 01/24/21 0338 01/25/21 0525 01/26/21 0427 01/27/21 0509 01/28/21 0422  NA 138   < > 143 144 144  K 3.0*   < > 3.4* 3.9 4.5  CL 102   < > 106 111 116*  CO2 29   < > 26 22 23   GLUCOSE 160*   < > 340* 333* 177*  BUN 62*   < > 72* 83* 95*  CREATININE 1.63*   < > 1.30* 1.29* 1.58*  CALCIUM 8.6*   < >  8.5* 8.3* 8.5*  PROT 5.6*  --   --   --  5.0*  ALBUMIN 2.7*  --   --   --  2.3*  AST 17  --   --   --  41  ALT 18  --   --   --  49*  ALKPHOS 42  --   --   --  46  BILITOT 1.1  --   --   --  0.8  GFRNONAA 44*   < > 57* 58* 45*  ANIONGAP 7   < > 11 11 5    < > = values in this interval not displayed.     Hematology Recent Labs  Lab 01/26/21 0427 01/26/21 1208 01/27/21 0541 01/27/21 1154 01/27/21 1842 01/28/21 0422  WBC 17.3*  --  17.4*  --   --  14.3*  RBC 2.39*  --  2.65*  --   --  2.33*  HGB 7.5*   < > 7.9* 7.8* 7.3* 7.1*  HCT 23.5*   < > 25.4* 24.4* 23.3* 22.5*  MCV 98.3  --  95.8  --   --  96.6  MCH 31.4  --  29.8  --   --  30.5  MCHC 31.9  --  31.1  --   --  31.6  RDW 15.8*  --  19.3*  --   --  19.6*  PLT 204  --  178  --   --  159   < > = values in this interval not displayed.    BNPNo results for input(s):  BNP, PROBNP in the last 168 hours.   DDimer No results for input(s): DDIMER in the last 168 hours.   Radiology    CT HEAD WO CONTRAST  Result Date: 01/26/2021 CLINICAL DATA:  Nonspecific dizziness.  Vision changes for 5 days. EXAM: CT HEAD WITHOUT CONTRAST TECHNIQUE: Contiguous axial images were obtained from the base of the skull through the vertex without intravenous contrast. COMPARISON:  None. FINDINGS: Brain: Dental artifact causes streak through the skull base. No intracranial hemorrhage, mass effect, or midline shift. No hydrocephalus. The basilar cisterns are patent. No evidence of territorial infarct or acute ischemia. No extra-axial or intracranial fluid collection. Vascular: Atherosclerosis of skullbase vasculature without hyperdense vessel or abnormal calcification. Skull: No fracture or focal lesion. Sinuses/Orbits: Nasogastric tube in place.  No acute findings. Other: None. IMPRESSION: No acute intracranial abnormality. Electronically Signed   By: Keith Rake M.D.   On: 01/26/2021 19:20   DG CHEST PORT 1 VIEW  Result Date: 01/27/2021 CLINICAL DATA:  Shortness of breath. EXAM: PORTABLE CHEST 1 VIEW.  AP portable semi erect. COMPARISON:  Chest x-ray 01/24/2021, CT chest 01/26/2021 FINDINGS: Right PICC with tip overlying the right atrium. Enteric tube coursing below the hemidiaphragm with tip and side port collimated off view. The tube is noted to loop overlying the gastric lumen. The heart size and mediastinal contours are unchanged. Calcifications of the aorta. Left base streaky airspace opacity likely representing atelectasis. No pulmonary edema. Interval improvement/resolution of a non existing versus trace left pleural effusion. No right pleural effusion. No pneumothorax. No acute osseous abnormality. IMPRESSION: 1. Streaky airspace opacity at the left base with elevated left hemidiaphragm likely representing atelectasis. 2. Improved/possible complete resolution of a left pleural  effusion. 3. Enteric tube courses below the hemidiaphragm with tip and side port collimated off view. Enteric tube is noted to be coiled overlying the gastric lumen. Consider retracting by 5 cm. Electronically Signed  By: Iven Finn M.D.   On: 01/27/2021 06:52   DG Abd Portable 2V  Result Date: 01/27/2021 CLINICAL DATA:  Abdominal distension EXAM: PORTABLE ABDOMEN - 2 VIEW COMPARISON:  Jan 26, 2021 FINDINGS: The NG tube terminates in the stomach. Air-filled loops of large and small bowel are identified. No free air, portal venous gas, or pneumatosis. IMPRESSION: Dilated air-filled loops of small bowel are again identified, with decreased distension in the interval. Air throughout the colon again identified. Findings suggest ileus versus partial small-bowel obstruction. Recommend attention on follow-up. Electronically Signed   By: Dorise Bullion III M.D   On: 01/27/2021 11:55   CT CHEST ABDOMEN PELVIS WO CONTRAST  Result Date: 01/26/2021 CLINICAL DATA:  Abdominal distension.  Anemia. EXAM: CT CHEST, ABDOMEN AND PELVIS WITHOUT CONTRAST TECHNIQUE: Multidetector CT imaging of the chest, abdomen and pelvis was performed following the standard protocol without IV contrast. COMPARISON:  Recent chest and abdominal radiographs. Abdominopelvic CT 01/23/2021 FINDINGS: CT CHEST FINDINGS Cardiovascular: Right upper extremity PICC tip at the atrial caval junction. Aortic atherosclerosis and tortuosity without aneurysm. There is dilatation of the main pulmonary artery at 4.3 cm. Mild cardiomegaly. Coronary artery calcifications. No pericardial effusion. Mediastinum/Nodes: Small mediastinal lymph nodes are not enlarged by size criteria. Limited assessment for hilar adenopathy on this noncontrast exam. Nasogastric tube in the esophagus, which is mildly patulous despite decompression. No suspicious thyroid nodule. Lungs/Pleura: Small bilateral pleural effusions. Associated basilar atelectasis. Additional compressive  atelectasis in the left lower lobe related to elevated hemidiaphragm. Motion artifact limits assessment of pulmonary parenchyma. Linear platelike atelectasis in the lingula. No septal thickening or findings of pulmonary edema. No pneumothorax. Musculoskeletal: Diffuse degenerative change throughout the thoracic spine. No acute osseous abnormalities are seen. No chest wall soft tissue abnormality. CT ABDOMEN PELVIS FINDINGS Hepatobiliary: Motion artifact through the liver and gallbladder limiting assessment. No focal hepatic abnormality allowing for motion and lack of IV contrast. Liver is prominent size spanning 20 cm cranial caudal. No calcified gallstones. Pancreas: Motion artifact through the pancreas. No obvious ductal dilatation or inflammation. Spleen: Upper normal in size spanning 13.1 cm. No focal abnormality allowing for motion and lack of IV contrast. Splenule inferiorly. Adrenals/Urinary Tract: No adrenal nodule. Motion artifact through the kidneys limits detailed assessment. There is no hydronephrosis. Bilateral renal cysts. More detailed assessment is limited. The urinary bladder is minimally distended. Mild bladder wall thickening. Stomach/Bowel: Enteric tube tip in the stomach. Stomach is decompressed. Small bowel loops are diffusely dilated measuring up to 3.9 cm, fluid and contrast filled. No obstruction, as enteric contrast reaches the colon. No evidence of small bowel inflammation. Appendix is not well seen on this current exam due to motion. Administered enteric contrast reaches the sigmoid colon. Sigmoid colonic diverticulosis without diverticulitis. Sigmoid colonic redundancy. Small volume of colonic stool. No evidence of colonic inflammation. Vascular/Lymphatic: Moderate aortic atherosclerosis. No aortic aneurysm. Motion limits assessment for adenopathy, no obvious enlarged lymph nodes in the abdomen or pelvis. Reproductive: Enlarged prostate gland spanning 5.4 cm. Other: Trace free fluid in  the pelvis. Perihepatic ascites in prior exam has resolved. Generalized subcutaneous and flank soft tissue edema. No free air or focal fluid collection. Musculoskeletal: Degenerative change throughout the lumbar spine and both hips. There are no acute or suspicious osseous abnormalities. IMPRESSION: 1. Small bilateral pleural effusions with adjacent basilar atelectasis. Additional compressive atelectasis in the left lower lobe related to elevated hemidiaphragm. 2. Diffusely dilated fluid and contrast filled small bowel loops, suggesting generalized ileus. No obstruction, as  enteric contrast reaches the colon. 3. Colonic diverticulosis without diverticulitis. 4. Enlarged prostate gland. Mild bladder wall thickening may be due to chronic bladder outlet obstruction. 5. Enlarged main pulmonary artery suggesting pulmonary arterial hypertension. 6. Aortic atherosclerosis and coronary artery calcifications. 7. Generalized body wall edema. Trace free fluid/ascites in the pelvis. Aortic Atherosclerosis (ICD10-I70.0). Electronically Signed   By: Keith Rake M.D.   On: 01/26/2021 19:46    Cardiac Studies   TTE 01/21/2021 1. Poor acoustic windows limit study even with Definity. Overall LVEF  appears normal with no signficant wall motion abnormalities . Left  ventricular ejection fraction, by estimation, is 50 to 55%. The left  ventricle has low normal function. There is  mild left ventricular hypertrophy.  2. Right ventricular systolic function is moderately reduced. The right  ventricular size is mildly enlarged. There is moderately elevated  pulmonary artery systolic pressure.  3. Trivial mitral valve regurgitation.  4. The aortic valve was not well visualized. Aortic valve regurgitation  is not visualized. Mild to moderate aortic valve sclerosis/calcification  is present, without any evidence of aortic stenosis.  5. The inferior vena cava is dilated in size with <50% respiratory  variability,  suggesting right atrial pressure of 15 mmHg.   R/LHC 2018:  Dist LAD lesion, 20 %stenosed.  The left ventricular ejection fraction is 55-65% by visual estimate. Assessment: 1. Minimal coronary plaque 2. Normal LV function EF 55-60% 3. Very mildly elevated pulmonary pressures (much lower than echo) 4. 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 Will get CT chest to further evaluate anomalous pulmonary vein. Given Qp/Qs < 1.5 and lack of RV strain will likely continue to tolerate well.   Echocardiogram 2018: Study Conclusions   - Left ventricle: The cavity size was normal. Wall thickness was  normal. Systolic function was normal. The estimated ejection  fraction was in the range of 50% to 55%. Wall motion was normal;  there were no regional wall motion abnormalities. Doppler  parameters are consistent with abnormal left ventricular  relaxation (grade 1 diastolic dysfunction).  - Ventricular septum: The contour showed systolic flattening. These  changes are consistent with RV pressure overload.  - Left atrium: The atrium was mildly dilated.  - Right ventricle: The cavity size was moderately dilated. Wall  thickness was mildly increased. Systolic function was mildly  reduced.  - Pulmonary arteries: Systolic pressure was moderately to severely  increased. PA peak pressure: 60 mm Hg (S).  - Inferior vena cava: The vessel was dilated. The respirophasic  diameter changes were blunted (< 50%), consistent with elevated  central venous pressure.   Patient Profile     Jalani Rominger is a 75 y.o. 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  Course has been complicated on 2/35/3614 by dehydration and hypotension.  He  subsequently developed an ileus and SBO.  He developed respiratory failure on 01/20/2021 due to aspiration.  VQ scan negative for PE.  He developed A. fib with RVR at that time.  Cardiology was consulted on 01/26/2021 for A. fib management in the setting of GI bleed and acute blood loss anemia.  Assessment & Plan    1. Paroxysmal atrial fibrillation: patient found to have atrial fibrillation with RVR this admission in the setting of GIB, acute blood loss anemia, SBO,  and aspiration PNA. Echo showed EF 50-55%, no RWMA, and no significant valvular abnormalities. He was NPO and started on amiodarone gtt with conversion to sinus rhythm. Now tolerating CLD. Anticoagulation deferred in the setting of GIB despite CHA2DS2-VASc Score = 5 [CHF History: No, HTN History: Yes, Diabetes History: Yes, Stroke History: No, Vascular Disease History: Yes, Age Score: 2, Gender Score: 0], giving patient an annual risk of stroke 7.2 %. Hgb trickling back down to 7.1 today. He is maintaining sinus rhythm at this time - Continue amiodarone gtt until clear he is tolerating po and no further interventions necessary - Recommend starting eliquis 5mg  BID once Hgb stabilizes and he is cleared to start anticoagulation by GI  2. HTN: BP intermittently elevated. Home metoprolol succinate AND tartrate, lisinopril, spironolactone, HCTZ, lasix, and doxazosin on hold.  - Favor restarting in step wise fashion - may not require all of his antihypertensives initially. Would start with BBlocker and ACEi  3. 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 RV was not dilated at that time, though now is dilated with dilated IVC on echo this admission. Felt this would not be an issue for him on Dr. Kathalene Frames assessment.  - Consider repeat surveillance echo outpatient to re-evaluate RV function in the future  4. Non-obstructive CAD: minimal LAD stenosis noted on LHC in 2018. No anginal complaints.  HsTrop elevated to 23 this admission in the setting of Afib with RVR, GIB, SOB, and aspiration PNA. Not c/w ACS - Resume statin when tolerating po  5. Prolonged QTc: 534 on EKG this morning. Reglan and zofran discontinued.  - Continue to limit QT prolonging medications  6. Volume overload: suspect multifactorial in the setting of poor nutritional status and volume resuscitation efforts this admission. He has 2-3+ LE edema L>R s/p L TKR. He has received 2 uPRBC this admission with plans for a 3rd transfusion today - Will give IV lasix 40 mg today following transfusion - Continue to monitor volume status closely  Remainder of care per primary team: - GIB with ongoing anemia requiring transfusion - SBO - Aspiration PNA - S/p TKR      For questions or updates, please contact [facility] Please consult [email or web address] for contact info under        Signed, Abigail Butts, PA-C  01/28/2021, 9:09 AM    Patient seen and examined.  Agree with above documentation.  On exam, patient is alert and oriented, regular rate and rhythm, no murmurs, expiratory wheezing, 2+ LE edema.  Telemetry shows sinus rhythm with rate 80-90s.  Appears to be maintaining sinus rhythm.  Continue IV amiodarone, can plan to switch to p.o. if tolerating diet.  Hgb 7.1, holding anticoagulation, can restart once OK from [employer]  Appears volume overloaded and getting blood transfusion today, will give dose of IV lasix 40 mg and continue to monitor volume status.  Donato Heinz, MD

## 2021-01-28 NOTE — Progress Notes (Addendum)
[facility] Progress Note  CC:  Ileus, upper GI bleed   Subjective:  He is not sleeping well off CPAP during admission. No CP. Mild SOB at times. No N/V. No abdominal pain.  He stated his abdomen feels a ittle less distended today.  He passed a large brown soft BM on 5/22 as reported by his RN.  No rectal bleeding or melena.  He is passing gas per the rectum.  He was out of the bed to the chair yesterday.  His RN intends to get him out of bed to the chair later today. Today his wife and son are at the bedside.   Objective:  Vital signs in last 24 hours: Temp:  [98.1 F (36.7 C)-99 F (37.2 C)] 98.1 F (36.7 C) (05/23 0400) Pulse Rate:  [85-96] 90 (05/23 0600) Resp:  [25-37] 27 (05/23 0600) BP: (103-146)/(40-60) 140/56 (05/23 0600) SpO2:  [94 %-98 %] 95 % (05/23 0600) Last BM Date: 01/27/21 General:   Alert ill appearing male in NAD.  Heart: Rhythm was briefly irregular then consistently regular.  Very soft systolic murmur. Pulm: Breath sounds clear throughout. Abdomen: Distended, gaseous distension, tympanic to percussion. Hypoactive bowel sounds x 4 quads. NGT clamped, scant amount of bilious drainage in tube with minimal residual drainage per nursing staff. No coffee ground or bright red NG drainage.  Extremities: Upper and lower extremities with 2+ edema. Neurologic:  Alert and  oriented x4. He moves all extremities weakly. Speech is clear.  Psych:  Alert and cooperative. Normal mood and affect.  Intake/Output from previous day: 05/22 0701 - 05/23 0700 In: 2767.3 [I.V.:2767.3] Out: 1450 [Urine:1250; Emesis/NG output:200] Intake/Output this shift: No intake/output data recorded.  Lab Results: Recent Labs    01/26/21 0427 01/26/21 1208 01/27/21 0541 01/27/21 1154 01/27/21 1842 01/28/21 0422  WBC 17.3*  --  17.4*  --   --  14.3*  HGB 7.5*   < > 7.9* 7.8* 7.3* 7.1*  HCT 23.5*   < > 25.4* 24.4* 23.3* 22.5*  PLT 204  --  178  --   --  159   < > = values  in this interval not displayed.   BMET Recent Labs    01/26/21 0427 01/27/21 0509 01/28/21 0422  NA 143 144 144  K 3.4* 3.9 4.5  CL 106 111 116*  CO2 26 22 23   GLUCOSE 340* 333* 177*  BUN 72* 83* 95*  CREATININE 1.30* 1.29* 1.58*  CALCIUM 8.5* 8.3* 8.5*   LFT Recent Labs    01/28/21 0422  PROT 5.0*  ALBUMIN 2.3*  AST 41  ALT 49*  ALKPHOS 46  BILITOT 0.8   PT/INR No results for input(s): LABPROT, INR in the last 72 hours. Hepatitis Panel No results for input(s): HEPBSAG, HCVAB, HEPAIGM, HEPBIGM in the last 72 hours.  CT HEAD WO CONTRAST  Result Date: 01/26/2021 CLINICAL DATA:  Nonspecific dizziness.  Vision changes for 5 days. EXAM: CT HEAD WITHOUT CONTRAST TECHNIQUE: Contiguous axial images were obtained from the base of the skull through the vertex without intravenous contrast. COMPARISON:  None. FINDINGS: Brain: Dental artifact causes streak through the skull base. No intracranial hemorrhage, mass effect, or midline shift. No hydrocephalus. The basilar cisterns are patent. No evidence of territorial infarct or acute ischemia. No extra-axial or intracranial fluid collection. Vascular: Atherosclerosis of skullbase vasculature without hyperdense vessel or abnormal calcification. Skull: No fracture or focal lesion. Sinuses/Orbits: Nasogastric tube in place.  No acute findings. Other: None. IMPRESSION:  No acute intracranial abnormality. Electronically Signed   By: Keith Rake M.D.   On: 01/26/2021 19:20   DG CHEST PORT 1 VIEW  Result Date: 01/27/2021 CLINICAL DATA:  Shortness of breath. EXAM: PORTABLE CHEST 1 VIEW.  AP portable semi erect. COMPARISON:  Chest x-ray 01/24/2021, CT chest 01/26/2021 FINDINGS: Right PICC with tip overlying the right atrium. Enteric tube coursing below the hemidiaphragm with tip and side port collimated off view. The tube is noted to loop overlying the gastric lumen. The heart size and mediastinal contours are unchanged. Calcifications of the  aorta. Left base streaky airspace opacity likely representing atelectasis. No pulmonary edema. Interval improvement/resolution of a non existing versus trace left pleural effusion. No right pleural effusion. No pneumothorax. No acute osseous abnormality. IMPRESSION: 1. Streaky airspace opacity at the left base with elevated left hemidiaphragm likely representing atelectasis. 2. Improved/possible complete resolution of a left pleural effusion. 3. Enteric tube courses below the hemidiaphragm with tip and side port collimated off view. Enteric tube is noted to be coiled overlying the gastric lumen. Consider retracting by 5 cm. Electronically Signed   By: Iven Finn M.D.   On: 01/27/2021 06:52   DG Abd Portable 2V  Result Date: 01/27/2021 CLINICAL DATA:  Abdominal distension EXAM: PORTABLE ABDOMEN - 2 VIEW COMPARISON:  Jan 26, 2021 FINDINGS: The NG tube terminates in the stomach. Air-filled loops of large and small bowel are identified. No free air, portal venous gas, or pneumatosis. IMPRESSION: Dilated air-filled loops of small bowel are again identified, with decreased distension in the interval. Air throughout the colon again identified. Findings suggest ileus versus partial small-bowel obstruction. Recommend attention on follow-up. Electronically Signed   By: Dorise Bullion III M.D   On: 01/27/2021 11:55   CT CHEST ABDOMEN PELVIS WO CONTRAST  Result Date: 01/26/2021 CLINICAL DATA:  Abdominal distension.  Anemia. EXAM: CT CHEST, ABDOMEN AND PELVIS WITHOUT CONTRAST TECHNIQUE: Multidetector CT imaging of the chest, abdomen and pelvis was performed following the standard protocol without IV contrast. COMPARISON:  Recent chest and abdominal radiographs. Abdominopelvic CT 01/23/2021 FINDINGS: CT CHEST FINDINGS Cardiovascular: Right upper extremity PICC tip at the atrial caval junction. Aortic atherosclerosis and tortuosity without aneurysm. There is dilatation of the main pulmonary artery at 4.3 cm. Mild  cardiomegaly. Coronary artery calcifications. No pericardial effusion. Mediastinum/Nodes: Small mediastinal lymph nodes are not enlarged by size criteria. Limited assessment for hilar adenopathy on this noncontrast exam. Nasogastric tube in the esophagus, which is mildly patulous despite decompression. No suspicious thyroid nodule. Lungs/Pleura: Small bilateral pleural effusions. Associated basilar atelectasis. Additional compressive atelectasis in the left lower lobe related to elevated hemidiaphragm. Motion artifact limits assessment of pulmonary parenchyma. Linear platelike atelectasis in the lingula. No septal thickening or findings of pulmonary edema. No pneumothorax. Musculoskeletal: Diffuse degenerative change throughout the thoracic spine. No acute osseous abnormalities are seen. No chest wall soft tissue abnormality. CT ABDOMEN PELVIS FINDINGS Hepatobiliary: Motion artifact through the liver and gallbladder limiting assessment. No focal hepatic abnormality allowing for motion and lack of IV contrast. Liver is prominent size spanning 20 cm cranial caudal. No calcified gallstones. Pancreas: Motion artifact through the pancreas. No obvious ductal dilatation or inflammation. Spleen: Upper normal in size spanning 13.1 cm. No focal abnormality allowing for motion and lack of IV contrast. Splenule inferiorly. Adrenals/Urinary Tract: No adrenal nodule. Motion artifact through the kidneys limits detailed assessment. There is no hydronephrosis. Bilateral renal cysts. More detailed assessment is limited. The urinary bladder is minimally distended. Mild bladder wall  thickening. Stomach/Bowel: Enteric tube tip in the stomach. Stomach is decompressed. Small bowel loops are diffusely dilated measuring up to 3.9 cm, fluid and contrast filled. No obstruction, as enteric contrast reaches the colon. No evidence of small bowel inflammation. Appendix is not well seen on this current exam due to motion. Administered enteric  contrast reaches the sigmoid colon. Sigmoid colonic diverticulosis without diverticulitis. Sigmoid colonic redundancy. Small volume of colonic stool. No evidence of colonic inflammation. Vascular/Lymphatic: Moderate aortic atherosclerosis. No aortic aneurysm. Motion limits assessment for adenopathy, no obvious enlarged lymph nodes in the abdomen or pelvis. Reproductive: Enlarged prostate gland spanning 5.4 cm. Other: Trace free fluid in the pelvis. Perihepatic ascites in prior exam has resolved. Generalized subcutaneous and flank soft tissue edema. No free air or focal fluid collection. Musculoskeletal: Degenerative change throughout the lumbar spine and both hips. There are no acute or suspicious osseous abnormalities. IMPRESSION: 1. Small bilateral pleural effusions with adjacent basilar atelectasis. Additional compressive atelectasis in the left lower lobe related to elevated hemidiaphragm. 2. Diffusely dilated fluid and contrast filled small bowel loops, suggesting generalized ileus. No obstruction, as enteric contrast reaches the colon. 3. Colonic diverticulosis without diverticulitis. 4. Enlarged prostate gland. Mild bladder wall thickening may be due to chronic bladder outlet obstruction. 5. Enlarged main pulmonary artery suggesting pulmonary arterial hypertension. 6. Aortic atherosclerosis and coronary artery calcifications. 7. Generalized body wall edema. Trace free fluid/ascites in the pelvis. Aortic Atherosclerosis (ICD10-I70.0). Electronically Signed   By: Keith Rake M.D.   On: 01/26/2021 19:46    Assessment / Plan:  68. 75 year old male s/p left TKA 01/16/2021 who developed a post op Ileus, improving. Patient passed a large nonbloody BM yesterday.  K+ 4.5. Mg 2.2. Magnesium 2.2. -OOB to chair as tolerated -Turn Q 2 hours -Consider discontinuing NGT and start clear liquid diet.  -Continue TPN -Dulcolax suppository Q HS -KUB in am  2. Upper GI bleed, coffee ground output per NGT on 5/20.  UGIB likely due to NGT trauma. No hematochezia or melena. Heparin on hold. Hg dropped to 6.8 -> received 2 units of PRBCs on 5/21. Today Hg 7.3 -.>7.1.  On unit of PRBCs ordered this am.  -Continue PPI infusion  -Await post transfusion H/H -Transfuse as needed -No plans for endoscopic evaluation at this time -Further recommendations per Dr. Ardis Hughs  3. Anemia  -See plan in # 2  4. Afib with RVR on Amiodarone infusion. Heparin on hold. Cardiology plans on starting Eliquis when Hg stabilizes and when cleared by our GI service   5. Nonobstructive CAD  6. Respiratory failure, pneumonia, on oxygen 2L [place]   LOS: 12 days   Noralyn Pick  01/28/2021, 9:37 AM   ________________________________________________________________________  Velora Heckler GI MD note:  I personally examined the patient, reviewed the data and agree with the assessment and plan described above.  It is essential that he be mobilized as much as possible. Need to keep his electrolytes (K, mg, phos) within normal range.  He still had a dilaudid order in place, has not needed any in 10 days, I d/cd the order.    He is very relieved that the NG tube was removed about 3 hours ago.  RN reports a large, non-bloody, liquid stool this afternoon.  We will follow along.  Owens Loffler, MD [facility] Pager [phone]

## 2021-01-28 NOTE — Progress Notes (Signed)
PROGRESS NOTE  [name] JJK:[id number] DOB: [birth year] DOA: 01/16/2021 PCP: Bing Neighbors, NP  HPI/Recap of past 34 hours: 75 year old man with hx of SBO, hypertension, hyperlipidemia, GERD, type 2 diabetes, presenting for TKR complicated by ileus, LLL PNA, new onset perioperative A. fib with RVR, noted on 01/20/2021 and started on amiodarone drip and heparin drip in the ICU.  He had acute hypoxic respiratory failure and was transferred to the ICU on 01/20/2021 and was seen by PCCM.  TRH, hospitalist team was initially consulted for hypotension requiring vasopressors, acute kidney injury with a creatinine of 4 and constipation with work-up of ileus versus SBO.  NG tube placed on 01/20/2021.  On 01/25/2021 NGT output was noted to be coffee-ground for which GI was consulted.  Was also noted to have acute blood loss anemia.  On 01/26/2021 he was noted to have symptomatic anemia 2 unit PRBC were ordered to be transfused.  Heparin drip held.  Discussed with cardiology Dr. Rayann Heman through curbside.  We will continue to hold off on heparin drip until the source of bleeding is controlled.    01/28/21: Seen and examined at his bedside.  He has had minimal output from his NG tube.  States he feels very tired. Hemoglobin dropped this morning 7.1.  Will transfuse 1 unit PRBC.  He denies any abdominal pain.  He has had bowel movements overnight.  Bowel sounds appreciated on exam.  He denies having any chest pain.     Assessment/Plan: Principal Problem:   Ileus ([name]) Active Problems:   Type 2 diabetes mellitus without complication, without long-term current use of insulin (HCC)   Essential hypertension   Obesity (BMI 30-39.9)   CAD (coronary artery disease)   Pulmonary HTN (HCC)   S/P TKR (total knee replacement) using cement   Acute respiratory distress   AKI (acute kidney injury) ([name])   Acute renal failure (HCC)   Benign prostatic hyperplasia   Obstructive sleep apnea treated with continuous  positive airway pressure (CPAP)   Hypotension   GERD (gastroesophageal reflux disease)   Protein-calorie malnutrition, moderate (HCC)   Gastrointestinal bleed   Atrial fibrillation (HCC)   Acute blood loss anemia   History of gastritis   Irritable bowel syndrome with intermittent diarrhea  Status post total left knee replacement 01/16/21 by Dr. [name] POD #12 Management per primary, orthopedic surgery.  Ileus, partial small bowel obstruction History of small bowel obstruction treated medically at outside facility in [place] in 2019. NG tube has been clamped and he has tolerated a clear liquid diet per Night bedside RN Bowel movements overnight Minimal output from NG tube.  Denies any abdominal pain.  Bowel sounds appreciated on exam. Protonix drip in place by general surgery in the setting of upper GI bleed with prior history of gastritis seen on EGD done on 06/18/2018. NG tube is still in place until ileus has resolved completely Follow abdominal x-ray. Replete electrolytes as indicated Goal magnesium greater than 2.0, goal potassium greater than 4.0. Mobilize as tolerated with PT OT alternating daily. Continue Dulcolax 10 mg daily Continue simethicone 80 mg 4 times daily. Appreciate general surgery consultative services.  Acute blood loss symptomatic anemia secondary to upper GI bleed. History of gastritis. Gastric content sent on 01/25/2021 positive for occult blood. Drop of hemoglobin 6.8 on 01/26/2021, transfused 2 unit PRBCs. Repeat hemoglobin 7.9 on 01/27/2021. Drop of hemoglobin 7.1 on 01/28/2021.  1 unit PRBC ordered to be transfused.  Patient is agreeable.  Repeat  H&H post transfusion. Received Protonix bolus on 01/26/2021.  Currently on Protonix drip. Hemoglobin 15.1 on 01/07/2021. GI following.  QTC prolongation. Twelve-lead EKG 01/28/2021 QTC 534. Minimize QTC prolonging agents as possible. DC'd Reglan. DC'd Zofran and replace with Tigan for nausea On Protonix  drip, defer to GI.  New onset perioperative A. fib with RVR, converted to sinus rhythm CHA2DS2-VASc of 4 Was noted on 01/20/2021: Patient was transferred to the ICU Was started on amiodarone drip and heparin drip on 01/20/2021 in the ICU. Currently on amiodarone drip and IV Lopressor for rate control Heparin drip has been DC'd on 01/26/2021 due to upper GI bleed. 2D echo was done on 01/21/2021 left atrium normal in size.  EF 50-55%. Twelve-lead EKG on 01/28/2021, converted to sinus rhythm.  AKI on CKD 3A Baseline creatinine 1.29 with GFR 58. Creatinine uptrending this morning 1.58. Continue to avoid nephrotoxic agents, dehydration and hypotension. Good urine output, continue to monitor. Repeat chemistry panel in the morning  OSA on CPAP Resume CPAP once NG tube is removed. CPAP was held due to NG tube being placed.  Resolved acute hypoxic respiratory failure secondary to left lower lobe pneumonia Completed IV antibiotics, course on Rocephin x5 days on 01/25/2021.   No longer requiring oxygen supplementation, 95% on room air. Personally reviewed chest x-ray done on 01/27/2021 which shows improvement of left lower lobe infiltrates. Continue to mobilize as tolerated  Essential hypertension DC Solu-Cortef, stress dose steroids on 01/25/2021 IV Lopressor as needed with parameters. BP is currently stable. Avoid hypotension due to AKI. Continue to monitor vital signs  Resolved post repletion: Hypokalemia Potassium 3.4>> 3.9> 4.5 Repleted with TPN  Prediabetes with hyperglycemia likely contributed by IV steroid which was discontinued on 01/25/2021. Hemoglobin A1c 5.7 on 01/16/2021 Insulin sliding scale if needed Avoid hypoglycemia  Electrolyte derangement on TPN Hypokalemia, repleted with TPN Hyperglycemia, dose adjustment by pharmacy Hypophosphatemia, repleted with TPN Appreciate pharmacy's assistance  Elevated troponin Peaked at 23 Heparin drip DC'd on 01/26/2021 due to upper GI  bleed. Denies any anginal symptoms at the time of this exam.  GERD Protonix drip.  Received Protonix bolus. Defer to GI.  Gout Resume home regimen when can take adequate oral   Code Status: Full code.  Family Communication: Updated his wife and son at bedside.  Disposition Plan: Defer to primary team.   Thank you for allowing [place] to participate in the care of this patient.  We will continue to follow along with you.     Objective: Vitals:   01/28/21 0300 01/28/21 0400 01/28/21 0500 01/28/21 0600  BP: 128/60 (!) 146/57 (!) 136/59 (!) 140/56  Pulse: 85 87 94 90  Resp: (!) 28 (!) 29 (!) 29 (!) 27  Temp:  98.1 F (36.7 C)    TempSrc:  Oral    SpO2: 94% 95% 95% 95%  Weight:      Height:        Intake/Output Summary (Last 24 hours) at 01/28/2021 0743 Last data filed at 01/28/2021 0604 Gross per 24 hour  Intake 2767.27 ml  Output 1450 ml  Net 1317.27 ml   Filed Weights   01/16/21 0650 01/20/21 1300 01/23/21 1122  Weight: 102.5 kg 104.4 kg 109.1 kg    Exam:  . General: 75 y.o. year-old male weak appearing in no acute distress.  NG tube in place.  He is alert and wanted x3. . Cardiovascular: Regular rate and rhythm no rubs or gallops. Marland Kitchen Respiratory: Mild rales at bases no wheezing noted.  Poor inspiratory effort.   . Abdomen: Obese mildly distended bowel sounds present.  Nontender.  . Musculoskeletal: Surgical dressing to left knee.   . Skin: No ulcerative lesions noted. Marland Kitchen Psychiatry: Mood is appropriate for condition and setting.   Data Reviewed: CBC: Recent Labs  Lab 01/24/21 0338 01/25/21 0525 01/25/21 1224 01/26/21 0427 01/26/21 1208 01/26/21 2300 01/27/21 0541 01/27/21 1154 01/27/21 1842 01/28/21 0422  WBC 13.7* 18.0*  --  17.3*  --   --  17.4*  --   --  14.3*  NEUTROABS 9.2*  --   --   --   --   --   --   --   --  11.6*  HGB 10.5* 9.4*   < > 7.5*   < > 7.8* 7.9* 7.8* 7.3* 7.1*  HCT 31.5* 28.8*   < > 23.5*   < > 23.9* 25.4* 24.4* 23.3* 22.5*  MCV  96.0 97.3  --  98.3  --   --  95.8  --   --  96.6  PLT 176 189  --  204  --   --  178  --   --  159   < > = values in this interval not displayed.   Basic Metabolic Panel: Recent Labs  Lab 01/24/21 0338 01/25/21 0525 01/26/21 0427 01/27/21 0509 01/28/21 0422  NA 138 144 143 144 144  K 3.0* 3.4* 3.4* 3.9 4.5  CL 102 108 106 111 116*  CO2 29 30 26 22 23   GLUCOSE 160* 208* 340* 333* 177*  BUN 62* 65* 72* 83* 95*  CREATININE 1.63* 1.30* 1.30* 1.29* 1.58*  CALCIUM 8.6* 8.5* 8.5* 8.3* 8.5*  MG 3.1* 2.6* 2.0 2.1 2.2  PHOS 2.5 1.9* 2.4* 3.4 3.7   GFR: Estimated Creatinine Clearance: 48.4 mL/min (A) (by C-G formula based on SCr of 1.58 mg/dL (H)). Liver Function Tests: Recent Labs  Lab 01/24/21 0338 01/28/21 0422  AST 17 41  ALT 18 49*  ALKPHOS 42 46  BILITOT 1.1 0.8  PROT 5.6* 5.0*  ALBUMIN 2.7* 2.3*   No results for input(s): LIPASE, AMYLASE in the last 168 hours. No results for input(s): AMMONIA in the last 168 hours. Coagulation Profile: No results for input(s): INR, PROTIME in the last 168 hours. Cardiac Enzymes: No results for input(s): CKTOTAL, CKMB, CKMBINDEX, TROPONINI in the last 168 hours. BNP (last 3 results) No results for input(s): PROBNP in the last 8760 hours. HbA1C: No results for input(s): HGBA1C in the last 72 hours. CBG: Recent Labs  Lab 01/27/21 1400 01/27/21 1555 01/27/21 1935 01/27/21 2305 01/28/21 0324  GLUCAP 209* 230* 148* 151* 165*   Lipid Profile: Recent Labs    01/28/21 0422  TRIG 123   Thyroid Function Tests: No results for input(s): TSH, T4TOTAL, FREET4, T3FREE, THYROIDAB in the last 72 hours. Anemia Panel: No results for input(s): VITAMINB12, FOLATE, FERRITIN, TIBC, IRON, RETICCTPCT in the last 72 hours. Urine analysis:    Component Value Date/Time   COLORURINE AMBER (A) 01/19/2021 1622   APPEARANCEUR CLOUDY (A) 01/19/2021 1622   LABSPEC 1.018 01/19/2021 1622   PHURINE 5.0 01/19/2021 1622   GLUCOSEU NEGATIVE 01/19/2021  1622   HGBUR MODERATE (A) 01/19/2021 1622   BILIRUBINUR NEGATIVE 01/19/2021 1622   KETONESUR NEGATIVE 01/19/2021 1622   PROTEINUR 100 (A) 01/19/2021 1622   NITRITE NEGATIVE 01/19/2021 1622   LEUKOCYTESUR NEGATIVE 01/19/2021 1622   Sepsis Labs: @LABRCNTIP (UVOZDGUYQIHKV:4,QVZDGLOVFIE:3)  )No results found for this or any previous visit (from the past 240  hour(s)).    Studies: DG Abd Portable 2V  Result Date: 01/27/2021 CLINICAL DATA:  Abdominal distension EXAM: PORTABLE ABDOMEN - 2 VIEW COMPARISON:  Jan 26, 2021 FINDINGS: The NG tube terminates in the stomach. Air-filled loops of large and small bowel are identified. No free air, portal venous gas, or pneumatosis. IMPRESSION: Dilated air-filled loops of small bowel are again identified, with decreased distension in the interval. Air throughout the colon again identified. Findings suggest ileus versus partial small-bowel obstruction. Recommend attention on follow-up. Electronically Signed   By: Dorise Bullion III M.D   On: 01/27/2021 11:55    Scheduled Meds: . sodium chloride   Intravenous Once  . allopurinol  100 mg Per Tube QHS  . bisacodyl  10 mg Rectal Daily  . Chlorhexidine Gluconate Cloth  6 each Topical Daily  . insulin aspart  0-15 Units Subcutaneous Q4H  . insulin glargine  8 Units Subcutaneous BID  . lip balm  1 application Topical BID  . mouth rinse  15 mL Mouth Rinse BID  . [START ON 01/30/2021] pantoprazole  40 mg Intravenous Q12H  . simethicone  80 mg Per Tube QID  . sodium chloride flush  10-40 mL Intracatheter Q12H    Continuous Infusions: . sodium chloride Stopped (01/24/21 1744)  . amiodarone 30 mg/hr (01/28/21 0604)  . lactated ringers    . pantoprozole (PROTONIX) infusion 8 mg/hr (01/28/21 0656)  . TPN ADULT (ION) 80 mL/hr at 01/28/21 0604     LOS: 12 days     Kayleen Memos, MD [facility] Pager [phone]  If 7PM-7AM, please contact night-coverage [email or web address] Password [facility] 01/28/2021,  7:43 AM

## 2021-01-29 ENCOUNTER — Inpatient Hospital Stay (HOSPITAL_COMMUNITY): Payer: Medicare Other

## 2021-01-29 DIAGNOSIS — R601 Generalized edema: Secondary | ICD-10-CM | POA: Diagnosis not present

## 2021-01-29 DIAGNOSIS — I480 Paroxysmal atrial fibrillation: Secondary | ICD-10-CM | POA: Diagnosis not present

## 2021-01-29 DIAGNOSIS — K567 Ileus, unspecified: Secondary | ICD-10-CM | POA: Diagnosis not present

## 2021-01-29 LAB — BPAM RBC
Blood Product Expiration Date: 202206212359
Blood Product Expiration Date: 202206222359
Blood Product Expiration Date: 202206222359
ISSUE DATE / TIME: 202205211351
ISSUE DATE / TIME: 202205211715
ISSUE DATE / TIME: 202205231016
Unit Type and Rh: 5100
Unit Type and Rh: 5100
Unit Type and Rh: 5100

## 2021-01-29 LAB — CBC
HCT: 27.1 % — ABNORMAL LOW (ref 39.0–52.0)
Hemoglobin: 8.5 g/dL — ABNORMAL LOW (ref 13.0–17.0)
MCH: 29.8 pg (ref 26.0–34.0)
MCHC: 31.4 g/dL (ref 30.0–36.0)
MCV: 95.1 fL (ref 80.0–100.0)
Platelets: 189 10*3/uL (ref 150–400)
RBC: 2.85 MIL/uL — ABNORMAL LOW (ref 4.22–5.81)
RDW: 18.8 % — ABNORMAL HIGH (ref 11.5–15.5)
WBC: 11.2 10*3/uL — ABNORMAL HIGH (ref 4.0–10.5)
nRBC: 0.4 % — ABNORMAL HIGH (ref 0.0–0.2)

## 2021-01-29 LAB — BASIC METABOLIC PANEL
Anion gap: 7 (ref 5–15)
BUN: 86 mg/dL — ABNORMAL HIGH (ref 8–23)
CO2: 20 mmol/L — ABNORMAL LOW (ref 22–32)
Calcium: 8.4 mg/dL — ABNORMAL LOW (ref 8.9–10.3)
Chloride: 115 mmol/L — ABNORMAL HIGH (ref 98–111)
Creatinine, Ser: 1.69 mg/dL — ABNORMAL HIGH (ref 0.61–1.24)
GFR, Estimated: 42 mL/min — ABNORMAL LOW (ref >60)
Glucose, Bld: 180 mg/dL — ABNORMAL HIGH (ref 70–99)
Potassium: 4.8 mmol/L (ref 3.5–5.1)
Sodium: 142 mmol/L (ref 135–145)

## 2021-01-29 LAB — TYPE AND SCREEN
ABO/RH(D): O POS
Antibody Screen: NEGATIVE
Unit division: 0
Unit division: 0
Unit division: 0

## 2021-01-29 LAB — BRAIN NATRIURETIC PEPTIDE: B Natriuretic Peptide: 69.1 pg/mL (ref 0.0–100.0)

## 2021-01-29 LAB — HEMOGLOBIN AND HEMATOCRIT, BLOOD
HCT: 25.6 % — ABNORMAL LOW (ref 39.0–52.0)
HCT: 25.4 % — ABNORMAL LOW (ref 39.0–52.0)
Hemoglobin: 8 g/dL — ABNORMAL LOW (ref 13.0–17.0)
Hemoglobin: 8.1 g/dL — ABNORMAL LOW (ref 13.0–17.0)

## 2021-01-29 LAB — PHOSPHORUS: Phosphorus: 4.8 mg/dL — ABNORMAL HIGH (ref 2.5–4.6)

## 2021-01-29 LAB — GLUCOSE, CAPILLARY
Glucose-Capillary: 170 mg/dL — ABNORMAL HIGH (ref 70–99)
Glucose-Capillary: 143 mg/dL — ABNORMAL HIGH (ref 70–99)
Glucose-Capillary: 155 mg/dL — ABNORMAL HIGH (ref 70–99)
Glucose-Capillary: 149 mg/dL — ABNORMAL HIGH (ref 70–99)
Glucose-Capillary: 157 mg/dL — ABNORMAL HIGH (ref 70–99)

## 2021-01-29 LAB — MAGNESIUM: Magnesium: 2.2 mg/dL (ref 1.7–2.4)

## 2021-01-29 MED ORDER — HYDROMORPHONE HCL 1 MG/ML IJ SOLN
1.0000 mg | INTRAMUSCULAR | Status: DC | PRN
Start: 2021-01-29 — End: 2021-01-30
  Administered 2021-01-29 – 2021-01-30 (×2): 1 mg via INTRAVENOUS
  Filled 2021-01-29 (×2): qty 1

## 2021-01-29 MED ORDER — PROSOURCE PLUS PO LIQD
30.0000 mL | Freq: Two times a day (BID) | ORAL | Status: DC
  Administered 2021-01-29 – 2021-02-06 (×10): 30 mL via ORAL
  Filled 2021-01-29 (×10): qty 30

## 2021-01-29 MED ORDER — TRAVASOL 10 % IV SOLN
INTRAVENOUS | Status: AC
  Filled 2021-01-29: qty 1056

## 2021-01-29 MED ORDER — BOOST / RESOURCE BREEZE PO LIQD CUSTOM
1.0000 | Freq: Two times a day (BID) | ORAL | Status: DC
  Administered 2021-01-29 – 2021-02-06 (×10): 1 via ORAL

## 2021-01-29 MED ORDER — DOCUSATE SODIUM 100 MG PO CAPS
100.0000 mg | ORAL_CAPSULE | Freq: Every day | ORAL | Status: DC
  Administered 2021-01-29 – 2021-02-05 (×7): 100 mg via ORAL
  Filled 2021-01-29 (×8): qty 1

## 2021-01-29 MED ORDER — IOHEXOL 9 MG/ML PO SOLN
ORAL | Status: AC
  Filled 2021-01-29: qty 1000

## 2021-01-29 NOTE — Progress Notes (Signed)
Progress Note  Patient Name: [name] Date of Encounter: 01/29/2021  [facility] Cardiologist: Ida Rogue, MD   Subjective   Denies chest pain or dyspnea.  Reports abdominal pain but stable  Inpatient Medications    Scheduled Meds: . allopurinol  100 mg Oral QHS  . bisacodyl  10 mg Rectal Daily  . Chlorhexidine Gluconate Cloth  6 each Topical Daily  . docusate sodium  100 mg Oral Daily  . insulin aspart  0-15 Units Subcutaneous Q4H  . insulin glargine  8 Units Subcutaneous BID  . lip balm  1 application Topical BID  . mouth rinse  15 mL Mouth Rinse BID  . metoprolol succinate  12.5 mg Oral Daily  . [START ON 01/30/2021] pantoprazole  40 mg Intravenous Q12H  . simethicone  80 mg Oral QID  . sodium chloride flush  10-40 mL Intracatheter Q12H   Continuous Infusions: . sodium chloride 0 mL (01/29/21 0305)  . amiodarone 30 mg/hr (01/29/21 1012)  . pantoprozole (PROTONIX) infusion 8 mg/hr (01/29/21 1012)  . TPN ADULT (ION) 80 mL/hr at 01/29/21 1012  . TPN ADULT (ION)     PRN Meds: acetaminophen, alum & mag hydroxide-simeth, diphenhydrAMINE, magic mouthwash, menthol-cetylpyridinium, metoprolol tartrate, naphazoline-glycerin, phenol, sodium chloride, sodium chloride flush, trimethobenzamide   Vital Signs    Vitals:   01/29/21 0600 01/29/21 0753 01/29/21 0800 01/29/21 1000  BP: (!) 135/50  (!) 159/47 (!) 126/47  Pulse: 81  81 79  Resp: (!) 26  17 (!) 23  Temp:  98.4 F (36.9 C)    TempSrc:  Axillary    SpO2: 99%  100% 99%  Weight:      Height:        Intake/Output Summary (Last 24 hours) at 01/29/2021 1137 Last data filed at 01/29/2021 1012 Gross per 24 hour  Intake 4027 ml  Output 2950 ml  Net 1077 ml   Last 3 Weights 01/23/2021 01/20/2021 01/16/2021  Weight (lbs) 240 lb 8.4 oz 230 lb 2.6 oz 226 lb  Weight (kg) 109.1 kg 104.4 kg 102.513 kg      Telemetry    Sinus rhythm with rates 70-90s- Personally Reviewed  ECG    Sinus rhythm, rate 92 bpm,  RBBB, PACs, no STE/D, no significant change from previous  - Personally Reviewed  Physical Exam   GEN: Ill appearing gentleman sitting upright in bed    Neck: No JVD Cardiac: RRR, no murmurs, rubs, or gallops.  Respiratory: mild crackles at lung bases GI: nontender, mildly distended  MS: 2+ LE edema L>R s/p L TKR with incision site dressing in place C/D/I Neuro:  Nonfocal  Psych: Normal affect   Labs    High Sensitivity Troponin:   Recent Labs  Lab 01/20/21 1220 01/20/21 2131  TROPONINIHS 21* 23*      Chemistry Recent Labs  Lab 01/24/21 0338 01/25/21 0525 01/27/21 0509 01/28/21 0422 01/29/21 0402  NA 138   < > 144 144 142  K 3.0*   < > 3.9 4.5 4.8  CL 102   < > 111 116* 115*  CO2 29   < > 22 23 20*  GLUCOSE 160*   < > 333* 177* 180*  BUN 62*   < > 83* 95* 86*  CREATININE 1.63*   < > 1.29* 1.58* 1.69*  CALCIUM 8.6*   < > 8.3* 8.5* 8.4*  PROT 5.6*  --   --  5.0*  --   ALBUMIN 2.7*  --   --  2.3*  --   AST 17  --   --  41  --   ALT 18  --   --  49*  --   ALKPHOS 42  --   --  46  --   BILITOT 1.1  --   --  0.8  --   GFRNONAA 44*   < > 58* 45* 42*  ANIONGAP 7   < > 11 5 7    < > = values in this interval not displayed.     Hematology Recent Labs  Lab 01/27/21 0541 01/27/21 1154 01/28/21 0422 01/28/21 1516 01/28/21 2034 01/29/21 0402 01/29/21 0914  WBC 17.4*  --  14.3*  --   --   --  11.2*  RBC 2.65*  --  2.33*  --   --   --  2.85*  HGB 7.9*   < > 7.1*   < > 8.1* 8.0* 8.5*  HCT 25.4*   < > 22.5*   < > 25.6* 25.6* 27.1*  MCV 95.8  --  96.6  --   --   --  95.1  MCH 29.8  --  30.5  --   --   --  29.8  MCHC 31.1  --  31.6  --   --   --  31.4  RDW 19.3*  --  19.6*  --   --   --  18.8*  PLT 178  --  159  --   --   --  189   < > = values in this interval not displayed.    BNP Recent Labs  Lab 01/29/21 0402  BNP 69.1     DDimer No results for input(s): DDIMER in the last 168 hours.   Radiology    DG Abd Portable 1V  Result Date:  01/29/2021 CLINICAL DATA:  Abdominal distention. EXAM: PORTABLE ABDOMEN - 1 VIEW COMPARISON:  01/27/2021. FINDINGS: Interim removal of NG tube. Persistent dilated loops of small bowel noted. The colon is slightly distended. Findings again suggest adynamic ileus. Partial small bowel obstruction again cannot be excluded. Continued follow-up exams suggested to demonstrate resolution of bowel distention. No free air. Bibasilar atelectasis. Degenerative changes lumbar spine and both hips. Pelvic calcifications consistent phleboliths. IMPRESSION: 1.  Interim removal of NG tube. 2. Persistent dilated loops of small bowel noted. The colon is slightly distended. Findings again suggest adynamic ileus. Partial small bowel obstruction again cannot be excluded. Continued follow-up exam suggested to demonstrate resolution of bowel distention. Electronically Signed   By: Marcello Moores  Register   On: 01/29/2021 06:40    Cardiac Studies   TTE 01/21/2021 1. Poor acoustic windows limit study even with Definity. Overall LVEF  appears normal with no signficant wall motion abnormalities . Left  ventricular ejection fraction, by estimation, is 50 to 55%. The left  ventricle has low normal function. There is  mild left ventricular hypertrophy.  2. Right ventricular systolic function is moderately reduced. The right  ventricular size is mildly enlarged. There is moderately elevated  pulmonary artery systolic pressure.  3. Trivial mitral valve regurgitation.  4. The aortic valve was not well visualized. Aortic valve regurgitation  is not visualized. Mild to moderate aortic valve sclerosis/calcification  is present, without any evidence of aortic stenosis.  5. The inferior vena cava is dilated in size with <50% respiratory  variability, suggesting right atrial pressure of 15 mmHg.   R/LHC 2018:  Dist LAD lesion, 20 %stenosed.  The left ventricular ejection fraction is 55-65%  by visual estimate. Assessment: 1. Minimal  coronary plaque 2. Normal LV function EF 55-60% 3. Very mildly elevated pulmonary pressures (much lower than echo) 4. 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 Will get CT chest to further evaluate anomalous pulmonary vein. Given Qp/Qs < 1.5 and lack of RV strain will likely continue to tolerate well.   Echocardiogram 2018: Study Conclusions   - Left ventricle: The cavity size was normal. Wall thickness was  normal. Systolic function was normal. The estimated ejection  fraction was in the range of 50% to 55%. Wall motion was normal;  there were no regional wall motion abnormalities. Doppler  parameters are consistent with abnormal left ventricular  relaxation (grade 1 diastolic dysfunction).  - Ventricular septum: The contour showed systolic flattening. These  changes are consistent with RV pressure overload.  - Left atrium: The atrium was mildly dilated.  - Right ventricle: The cavity size was moderately dilated. Wall  thickness was mildly increased. Systolic function was mildly  reduced.  - Pulmonary arteries: Systolic pressure was moderately to severely  increased. PA peak pressure: 60 mm Hg (S).  - Inferior vena cava: The vessel was dilated. The respirophasic  diameter changes were blunted (< 50%), consistent with elevated  central venous pressure.   Patient Profile     Andrej Spagnoli is a 75 y.o. 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  Course has been complicated on 12/20/2438 by dehydration and hypotension.  He subsequently developed an ileus and SBO.  He developed respiratory failure on 01/20/2021 due to aspiration.  VQ scan negative for PE.  He developed A. fib with RVR at that time.  Cardiology was  consulted on 01/26/2021 for A. fib management in the setting of GI bleed and acute blood loss anemia.  Assessment & Plan    1. Paroxysmal atrial fibrillation: patient found to have atrial fibrillation with RVR this admission in the setting of GIB, acute blood loss anemia, SBO, and aspiration PNA. Echo showed EF 50-55%, no RWMA, and no significant valvular abnormalities. He was NPO and started on amiodarone gtt with conversion to sinus rhythm. Now tolerating CLD. Anticoagulation deferred in the setting of GIB despite CHA2DS2-VASc Score = 5 [CHF History: No, HTN History: Yes, Diabetes History: Yes, Stroke History: No, Vascular Disease History: Yes, Age Score: 2, Gender Score: 0], giving patient an annual risk of stroke 7.2 %. Hgb trickling back down to 7.1 today. He is maintaining sinus rhythm at this time - Continue amiodarone gtt until clear he is tolerating po and no further interventions necessary - Recommend starting eliquis 5mg  BID once Hgb stabilizes and he is cleared to start anticoagulation by GI  2. HTN: BP intermittently elevated. Home metoprolol succinate AND tartrate, lisinopril, spironolactone, HCTZ, lasix, and doxazosin on hold.  - Favor restarting in step wise fashion - may not require all of his antihypertensives initially. Would start with BBlocker and ACEi  3. 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 RV was not dilated at that time, though now is dilated with dilated IVC on echo this admission. Felt this would not be an issue for him on Dr. Kathalene Frames assessment.  - Consider repeat surveillance echo outpatient to re-evaluate RV function in the future  4.  Non-obstructive CAD: minimal LAD stenosis noted on LHC in 2018. No anginal complaints. HsTrop elevated to 23 this admission in the setting of Afib with RVR, GIB, SOB, and aspiration PNA. Not c/w ACS - Resume statin when tolerating po  5. Prolonged QTc: 534 on EKG this morning.  Reglan and zofran discontinued.  - Continue to limit QT prolonging medications  6. Volume overload: suspect multifactorial in the setting of poor nutritional status (albumin 2.3) and volume resuscitation efforts this admission. He has 2-3+ LE edema L>R s/p L TKR. He has received 3 u PRBCs - Continue to monitor volume status closely.   Remainder of care per primary team: - GIB with ongoing anemia requiring transfusion - SBO - Aspiration PNA - S/p TKR      For questions or updates, please contact [place] Please consult [email or web address] for contact info under        Signed, Donato Heinz, MD  01/29/2021, 11:37 AM

## 2021-01-29 NOTE — TOC Progression Note (Signed)
Transition of Care [facility]) - Progression Note    Patient Details  Name: [name] MRN: [id number] Date of Birth: [birth year]  [facility]) CM/SW Contact  Leeroy Cha, RN Phone Number: 01/29/2021, 9:18 AM  Clinical Narrative:    13 Days Post-Op  Subjective: CC: afebrile, hypertensive. He reports having a bowel movement yesterday and feels like he could have one again today if he is able to get up to a commode. He is motivated to move more though he does feel very weak. He is burping more today and with little appetite. Not sure if he is passing flatus. He denies nausea or emesis. He does have shortness of breath and is on supplemental O2  Objective: Vital signs in last 24 hours: Temp:  [98 F (36.7 C)-99.7 F (37.6 C)] 98.4 F (36.9 C) (05/24 0753) Pulse Rate:  [81-96] 81 (05/24 0600) Resp:  [26-29] 26 (05/24 0600) BP: (135-164)/(40-55) 135/50 (05/24 0600) SpO2:  [94 %-99 %] 99 % (05/24 0600) Last BM Date: 01/28/21 PLAN: continue following for progression at this point for dc needs.  Expected Discharge Plan: [place] Barriers to Discharge: Barriers Resolved  Expected Discharge Plan and Services Expected Discharge Plan: [place] In-house Referral: Clinical Social Work   Post Acute Care Choice: [place] Living arrangements for the past 2 months: [place] Expected Discharge Date: 01/20/21               DME Arranged: 3-N-1,[place] rolling DME Agency: [employer] Date DME Agency Contacted: 01/19/21 Time DME Agency Contacted: [id number] Representative spoke with at DME Agency: [place]: PT [place]: [facility] [place] office) Date [place]: 01/18/21 Time [place]: 1000 Representative spoke with at [place] (ph# [phone]; fax # [phone])   Social Determinants of Health ([place]) Interventions    Readmission Risk  Interventions Readmission Risk Prevention Plan 01/17/2021  Transportation Screening Complete  PCP or Specialist Appt within 5-7 Days Complete  Home Care Screening Complete  Some recent data might be hidden

## 2021-01-29 NOTE — Progress Notes (Addendum)
[facility] Progress Note  CC:  Ileus, upper GI bleed   Subjective: He is sitting up in the chair. NGT was discontinued yesterday. He is tolerating few sips of clear liquids. Some burping. No N/V. He feels less distended today. No abdominal pain "just some discomfort". He passed a moderate amount of dark brown to black loose stool this morning as reported by his RN. No CP or SOB. He used Cpap last night for a few hours. No family at the bedside.  Objective:   Abdominal Xray 1 view 01/29/2021: 1.  Interim removal of NG tube. 2. Persistent dilated loops of small bowel noted. The colon is slightly distended. Findings again suggest adynamic ileus. Partial small bowel obstruction again cannot be excluded.   Vital signs in last 24 hours: Temp:  [98 F (36.7 C)-99.7 F (37.6 C)] 98.4 F (36.9 C) (05/24 0753) Pulse Rate:  [81-96] 81 (05/24 0800) Resp:  [17-29] 17 (05/24 0800) BP: (135-164)/(40-55) 159/47 (05/24 0800) SpO2:  [94 %-100 %] 100 % (05/24 0800) Last BM Date: 01/27/21   General:   Alert fatigued appearing male in NAD. Sitting up in the chair.  Heart: RRR, no murmur.  Pulm:  Breath sounds clear throughout.  Abdomen: His upper abdomen appears more distended on exam today when sitting in the chair. Soft, nontender. Tympanic to percussion. Hypoactive bowel sounds x 4 quads.  Extremities:  Bilateral LEs with 2+ pitting edema.  Neurologic:  Alert and  oriented x 4. Moves all extremities weakly.  Psych:  Alert and cooperative. Normal mood and affect.  Lab Results: Recent Labs    01/27/21 0541 01/27/21 1154 01/28/21 0422 01/28/21 1516 01/28/21 2034 01/29/21 0402 01/29/21 0914  WBC 17.4*  --  14.3*  --   --   --  11.2*  HGB 7.9*   < > 7.1*   < > 8.1* 8.0* 8.5*  HCT 25.4*   < > 22.5*   < > 25.6* 25.6* 27.1*  PLT 178  --  159  --   --   --  189   < > = values in this interval not displayed.   BMET Recent Labs    01/27/21 0509 01/28/21 0422  01/29/21 0402  NA 144 144 142  K 3.9 4.5 4.8  CL 111 116* 115*  CO2 22 23 20*  GLUCOSE 333* 177* 180*  BUN 83* 95* 86*  CREATININE 1.29* 1.58* 1.69*  CALCIUM 8.3* 8.5* 8.4*   LFT Recent Labs    01/28/21 0422  PROT 5.0*  ALBUMIN 2.3*  AST 41  ALT 49*  ALKPHOS 46  BILITOT 0.8   PT/INR No results for input(s): LABPROT, INR in the last 72 hours. Hepatitis Panel No results for input(s): HEPBSAG, HCVAB, HEPAIGM, HEPBIGM in the last 72 hours.  DG Abd Portable 1V  Result Date: 01/29/2021 CLINICAL DATA:  Abdominal distention. EXAM: PORTABLE ABDOMEN - 1 VIEW COMPARISON:  01/27/2021. FINDINGS: Interim removal of NG tube. Persistent dilated loops of small bowel noted. The colon is slightly distended. Findings again suggest adynamic ileus. Partial small bowel obstruction again cannot be excluded. Continued follow-up exams suggested to demonstrate resolution of bowel distention. No free air. Bibasilar atelectasis. Degenerative changes lumbar spine and both hips. Pelvic calcifications consistent phleboliths. IMPRESSION: 1.  Interim removal of NG tube. 2. Persistent dilated loops of small bowel noted. The colon is slightly distended. Findings again suggest adynamic ileus. Partial small bowel obstruction again cannot be excluded. Continued follow-up exam suggested to demonstrate  resolution of bowel distention. Electronically Signed   By: Marcello Moores  Register   On: 01/29/2021 06:40   DG Abd Portable 2V  Result Date: 01/27/2021 CLINICAL DATA:  Abdominal distension EXAM: PORTABLE ABDOMEN - 2 VIEW COMPARISON:  Jan 26, 2021 FINDINGS: The NG tube terminates in the stomach. Air-filled loops of large and small bowel are identified. No free air, portal venous gas, or pneumatosis. IMPRESSION: Dilated air-filled loops of small bowel are again identified, with decreased distension in the interval. Air throughout the colon again identified. Findings suggest ileus versus partial small-bowel obstruction. Recommend  attention on follow-up. Electronically Signed   By: Dorise Bullion III M.D   On: 01/27/2021 11:55    Assessment / Plan:  75 year old male s/p left TKA 01/16/2021 who developed a post op Ileus, improving. Today K+ 4.8. Magnesium 2.2. Abdominal xray today showed persistent dilated loops of small bowel. The colon is slightly distended. NGT was removed 5/23. Tolerating few sips of clear liquids. No N/V. He is having some burping. He passed a loose dark brown to black loose stool this am.  -OOB to chair as tolerated -Turn Q 2 hours -Continue TPN -Clear liquids as tolerated  -Dulcolax suppository Q HS -Simethicone as needed -Repeat K+, Magnesium level in am.  -Further recommendations per Dr. Ardis Hughs   2. Upper GI bleed, coffee ground output per NGT on 5/20. UGIB likely due to NGT trauma. No hematochezia or melena. Heparin on hold. Hg dropped to 6.8 -> received 2 units of PRBCs on 5/21. Today Hg 7.3 -.>7.1. Transfused one unit of PRBCs -> Hg 8.1 -> Today Hg 8.0 -> 8.5. -Transfuse as needed -No plans for endoscopic evaluation at this time -Further recommendations per Dr. Ardis Hughs  3. Anemia.  -See plan in # 2  4. Afib with RVR on Amiodarone infusion. Heparin on hold. Cardiology plans on starting Eliquis when Hg stabilizes and when cleared by our GI service   5. Nonobstructive CAD  6.  Respiratory failure, pneumonia, on oxygen 2L [place].    LOS: 13 days   Noralyn Pick  01/29/2021, 9:43 AM  ________________________________________________________________________  Velora Heckler GI MD note:  I personally examined the patient, reviewed the data and agree with the assessment and plan described above.  He has a post operative ileus that is slow to resolve.  He has made some small strides in the past 2-3 days fortunately.    Very important that he continue to mobilize (OOB into chair, ambulating with assist, changing positions in the bed when he is in bed... all these will help him to  pass some of this trapped gas.    I don't think he is having GI bleeding, OK to resume his blood thinner from my perspective.   Continue clear liquids for now and hopefully we can stop his TNA in the next 1-2 days or so if he is tolerating oral nutrition better.   Owens Loffler, MD [facility] Pager [phone]

## 2021-01-29 NOTE — Progress Notes (Signed)
PHARMACY - TOTAL PARENTERAL NUTRITION CONSULT NOTE   Indication: Prolonged ileus  Patient Measurements: Height: 5\' 8"  (172.7 cm) Weight: 109.1 kg (240 lb 8.4 oz) IBW/kg (Calculated) : 68.4 TPN AdjBW (KG): 77.4 Body mass index is 36.57 kg/m. 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 Post op course complicated by Afib, aspiration PNA and prolonged ileus. Pharmacy consulted to manage TPN for nutrition support.  Glucose / Insulin: Hx DM2 on only metformin PTA  Serum Glucose with AM labs: 180; CBG range 143-170.    Regular Insulin in TPN:  15 units (started 5/21)  Lantus 8 qday to 8 q12 on 5/22 (managed by TRH)  Moderate SSI 18 units/24 hrs   Solucortef 100 mg bid dc'd 5/20. Last dose 5/20 @ 5 am  Electrolytes: K up to 4.8 (s/p KCl 5/22, lasix 5/23), goal >=4; Mg 2.2; Phos increased to 4.8, CoCa 9.86; Cl elevated at 115, CO2 20 Renal: Postop AKI w/ SCr peaking at 4 (baseline 1.4) , then was improving, but now increasing to 1.69; BUN 86  Hepatic: LFTs stable WNL; mild bump in tbili resolved; Trig 98 (5/10)> 123 (5/23) Preabumin 10 (5/19), 13.5 (5/23) I/O, mIVF:  IVF NS 10 ml/hr, Amio gtt at 17 ml/hr, Protonix gtt @ 10 ml/hr (5/21-5/24), PRBC (5/23)  I/O net +437 mL; UOP 3250 mL, no vomiting.  One brown/black liquid stool  Relistor x2 (5/15, 5/17), Bisacodyl supp daily (5/21 > ).  Scheduled IV reglan (5/15-5/23) and miralax BID (5/18-5/21) GI Imaging:  - 5/18 CT A/P: ileus vs distal SBO 5/19 KUB: diffuse gaseous bowel distention persists 5/20 KUB: persistent & stable SBO 5/21 KUB: unchanged gaseous distention of colon & SB, contrast from 2 days ago in cecum GI Surgeries / Procedures: none  Central access: CVC; PICC placed 5/19 for TPN TPN start date: 01/23/21  Nutritional Goals (RD recommendations 5/18):  Kcal:  1880-2090 kcal, Protein:  90-100 grams, Fluid:  >/= 2 L/day  Current TPN at goal rate of 80 mL/hr provides 106 g of protein,  1978 kcals per day  Keep K >/= 4 and Mg >/=2 with ileus  Current Nutrition:  TPN at goal rate Diet: CLD (5/21)  Plan:   Continue TPN @ 39mL/hr - provides 100% of goals  Electrolytes in TPN:  Na - 53mEq/L  K - 30 mEq/L  Ca - 44mEq/L  Mg - 5 mEq/L  Phos - 5 mmol/L  Cl:Ac ratio: change max Acetate  Add standard MVI and trace elements to TPN  Continue moderate scale SSI q4h and adjust as needed  Continue insulin 15 units in TPN   Note: Lantus 8 units q12 per TRH  MIVF NS at [facility] per MD  Monitor TPN labs on Mon/Thurs   Gretta Arab PharmD, BCPS Clinical Pharmacist [facility] main pharmacy [phone] 01/29/2021 8:22 AM

## 2021-01-29 NOTE — Progress Notes (Signed)
Subjective: 13 Days Post-Op Procedure(s) (LRB): TOTAL KNEE ARTHROPLASTY (Left) Patient reports pain as 4 on 0-10 scale.   Denies CP or SOB.  Voiding without difficulty. Positive flatus. Objective: Vital signs in last 24 hours: Temp:  [97.9 F (36.6 C)-99.7 F (37.6 C)] 97.9 F (36.6 C) (05/24 1200) Pulse Rate:  [79-95] 79 (05/24 1000) Resp:  [17-29] 23 (05/24 1000) BP: (126-164)/(42-55) 126/47 (05/24 1000) SpO2:  [96 %-100 %] 99 % (05/24 1000)  Intake/Output from previous day: 05/23 0701 - 05/24 0700 In: 3687.5 [I.V.:3057.5; Blood:630] Out: 3250 [Urine:3250] Intake/Output this shift: Total I/O In: 654.5 [I.V.:654.5] Out: 500 [Urine:500]  Recent Labs    01/28/21 0422 01/28/21 1516 01/28/21 2034 01/29/21 0402 01/29/21 0914  HGB 7.1* 8.1* 8.1* 8.0* 8.5*   Recent Labs    01/28/21 0422 01/28/21 1516 01/29/21 0402 01/29/21 0914  WBC 14.3*  --   --  11.2*  RBC 2.33*  --   --  2.85*  HCT 22.5*   < > 25.6* 27.1*  PLT 159  --   --  189   < > = values in this interval not displayed.   Recent Labs    01/28/21 0422 01/29/21 0402  NA 144 142  K 4.5 4.8  CL 116* 115*  CO2 23 20*  BUN 95* 86*  CREATININE 1.58* 1.69*  GLUCOSE 177* 180*  CALCIUM 8.5* 8.4*   No results for input(s): LABPT, INR in the last 72 hours.  Neurologically intact Intact pulses distally Dorsiflexion/Plantar flexion intact Incision: no drainage  Assessment/Plan:  13 Days Post-Op Procedure(s) (LRB): TOTAL KNEE ARTHROPLASTY (Left)  Hgb stable SCD's Advance diet Up with therapy  Recommend consult for Rehab [employer] dr4essing. Staple remover   Principal Problem:   Ileus ([place]) Active Problems:   Type 2 diabetes mellitus without complication, without long-term current use of insulin (HCC)   Essential hypertension   Obesity (BMI 30-39.9)   CAD (coronary artery disease)   Pulmonary HTN (HCC)   S/P TKR (total knee replacement) using cement   Acute respiratory distress   AKI (acute  kidney injury) ([place])   Acute renal failure (HCC)   Benign prostatic hyperplasia   Obstructive sleep apnea treated with continuous positive airway pressure (CPAP)   Hypotension   GERD (gastroesophageal reflux disease)   Protein-calorie malnutrition, moderate (HCC)   Gastrointestinal bleed   PAF (paroxysmal atrial fibrillation) (HCC)   Acute blood loss anemia   History of gastritis   Irritable bowel syndrome with intermittent diarrhea   SOB (shortness of breath)   Anasarca      [name] 01/29/2021, @NOW 

## 2021-01-29 NOTE — Progress Notes (Signed)
PROGRESS NOTE  [name] CZY:[id number] DOB: [birth year] DOA: 01/16/2021 PCP: Bing Neighbors, NP  HPI/Recap of past 72 hours: 75 year old man with hx of SBO, hypertension, hyperlipidemia, GERD, type 2 diabetes, presenting for TKR complicated by ileus, LLL PNA, new onset perioperative A. fib with RVR, noted on 01/20/2021 and started on amiodarone drip and heparin drip in the ICU.  He had acute hypoxic respiratory failure and was transferred to the ICU on 01/20/2021 and was seen by PCCM.  TRH, hospitalist team was initially consulted for hypotension requiring vasopressors, acute kidney injury with a creatinine of 4 and constipation with work-up of ileus versus SBO.  NG tube placed on 01/20/2021.  NG tube removed on 01/28/2021.  On 01/25/2021 NGT output was noted to be coffee-ground for which GI was consulted.  Was also noted to have acute blood loss anemia.  On 01/26/2021 he was noted to have symptomatic anemia 2 unit PRBC were ordered to be transfused.  Heparin drip stopped.  Cardiology consulted.  Hemoglobin dropped on 01/28/2021 post 1 unit PRBC transfusion.  01/29/21: Patient was seen and examined at his bedside.  NG tube was removed on 01/28/2021.  Last bowel movement was on 01/27/2021.  He reports abdominal pain last night after eating a clear liquid diet.  He feels bloated.  He denies any nausea at the time of this visit.  He denies any cardiopulmonary symptoms.  Increased respiration rate and belly breathing noted on exam.  Reports he wore his CPAP last night for a few hours.   Assessment/Plan: Principal Problem:   Ileus ([place]) Active Problems:   Type 2 diabetes mellitus without complication, without long-term current use of insulin (HCC)   Essential hypertension   Obesity (BMI 30-39.9)   CAD (coronary artery disease)   Pulmonary HTN (HCC)   S/P TKR (total knee replacement) using cement   Acute respiratory distress   AKI (acute kidney injury) ([place])   Acute renal failure (HCC)   Benign  prostatic hyperplasia   Obstructive sleep apnea treated with continuous positive airway pressure (CPAP)   Hypotension   GERD (gastroesophageal reflux disease)   Protein-calorie malnutrition, moderate (HCC)   Gastrointestinal bleed   Atrial fibrillation (HCC)   Acute blood loss anemia   History of gastritis   Irritable bowel syndrome with intermittent diarrhea   SOB (shortness of breath)  Status post total left knee replacement 01/16/21 by Dr. [name] POD #13 Management per primary, orthopedic surgery. Out of bed to chair with every shift. Mobilize as tolerated Continue PT OT with assistance and fall precautions.  Persistent ileus, partial small bowel obstruction History of small bowel obstruction treated medically at outside facility in [place] in 2019. NG tube was removed on 01/28/2021. He was started on a clear liquid diet on 01/27/2021 No bowel movements reported this morning, last was from 5/22 Hypoactive bowel sounds on exam. Continue Dulcolax and simethicone. Reviewed abdominal x-ray showing persistent ileus, partial small bowel obstruction could not be excluded. Continue to replete and optimize magnesium and potassium levels. Mobilize as tolerated. Appreciate general surgery consultative services.  Acute blood loss symptomatic anemia secondary to upper GI bleed. History of gastritis. Gastric content sent on 01/25/2021 positive for occult blood. Drop of hemoglobin 6.8 on 01/26/2021, transfused 2 unit PRBCs. Repeat hemoglobin 7.9 on 01/27/2021. Drop of hemoglobin 7.1 on 01/28/2021.  Transfuse 1 unit PRBCs.   Hemoglobin currently stable 8.0 from 8.1.   No overt bleeding reported.   Received Protonix bolus on 01/26/2021.  Currently on Protonix drip. Hemoglobin 15.1 on 01/07/2021. GI following.  Acute hypoxic respiratory failure secondary to recent left lower lobe pneumonia Wean off oxygen supplementation as tolerated CPAP at night Incentive spirometer Flutter  valve.  Chronic diastolic CHF Last 2D echo done on 01/21/2021 showed LVEF 50 to 55% Continue strict I's and O's and daily weight Net I&O +9.3 L  QTC prolongation. Twelve-lead EKG 01/28/2021 QTC 534. Minimize QTC prolonging agents as possible. DC'd Reglan. DC'd Zofran and replace with Tigan for nausea On Protonix drip, defer to GI.  New onset perioperative A. fib with RVR, converted to sinus rhythm CHA2DS2-VASc of 4 Was noted on 01/20/2021: Patient was transferred to the ICU Was started on amiodarone drip and heparin drip on 01/20/2021 in the ICU. Currently on amiodarone drip and IV Lopressor for rate control Heparin drip has been DC'd on 01/26/2021 due to upper GI bleed. 2D echo was done on 01/21/2021 left atrium normal in size.  EF 50-55%. Twelve-lead EKG on 01/28/2021, converted to sinus rhythm.  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 Creatinine uptrending this morning 1.69 from 1.58. Continue to avoid nephrotoxic agents, dehydration and hypotension. Repeat chemistry panel in the morning  OSA on CPAP Continue CPAP nightly.  Essential hypertension DC Solu-Cortef, stress dose steroids on 01/25/2021 IV Lopressor as needed with parameters. BP is currently stable. Avoid hypotension due to AKI. Continue to monitor vital signs  Resolved post repletion: Hypokalemia Potassium 3.4>> 3.9> 4.5 Repleted with TPN  Prediabetes with hyperglycemia likely contributed by IV steroid which was discontinued on 01/25/2021. Hemoglobin A1c 5.7 on 01/16/2021 Insulin sliding scale if needed Avoid hypoglycemia  Electrolyte derangement on TPN Hypokalemia, repleted with TPN Hyperglycemia, dose adjustment by pharmacy Hypophosphatemia, repleted with TPN Appreciate pharmacy's assistance  Elevated troponin Peaked at 23 Heparin drip DC'd on 01/26/2021 due to upper GI bleed. Denies any anginal symptoms at the time of this exam.  GERD Protonix drip.  Received Protonix  bolus. Defer to GI.  Gout Resume home regimen when can take adequate oral   Code Status: Full code.  Family Communication: Updated his wife and son at bedside.  Disposition Plan: Defer to primary team.   Thank you for allowing [place] to participate in the care of this patient.  We will continue to follow along with you.     Objective: Vitals:   01/29/21 0200 01/29/21 0354 01/29/21 0400 01/29/21 0600  BP: (!) 159/53  (!) 152/42 (!) 135/50  Pulse: 82  81 81  Resp: (!) 26  (!) 26 (!) 26  Temp:  98.4 F (36.9 C)    TempSrc:  Axillary    SpO2: 97%  96% 99%  Weight:      Height:        Intake/Output Summary (Last 24 hours) at 01/29/2021 0752 Last data filed at 01/29/2021 0730 Gross per 24 hour  Intake 3687.48 ml  Output 3750 ml  Net -62.52 ml   Filed Weights   01/16/21 0650 01/20/21 1300 01/23/21 1122  Weight: 102.5 kg 104.4 kg 109.1 kg    Exam:  . General: 75 y.o. year-old male weak appearing in no acute distress.  No NG tube in place.  He is alert oriented x3.   . Cardiovascular: Regular rate and rhythm no rubs or gallops. Marland Kitchen Respiratory: Mild rales at bases no wheezing noted.  Poor inspiratory effort.   . Abdomen: Obese distended nontender with palpation.  Hypoactive bowel sounds.  . Musculoskeletal: Surgical dressing to left knee.  1+ pitting edema in lower extremities bilaterally. . Skin: No ulcerative lesions noted. Marland Kitchen Psychiatry: Mood is appropriate for condition and setting.   Data Reviewed: CBC: Recent Labs  Lab 01/24/21 0338 01/25/21 0525 01/25/21 1224 01/26/21 0427 01/26/21 1208 01/27/21 0541 01/27/21 1154 01/27/21 1842 01/28/21 0422 01/28/21 1516 01/28/21 2034 01/29/21 0402  WBC 13.7* 18.0*  --  17.3*  --  17.4*  --   --  14.3*  --   --   --   NEUTROABS 9.2*  --   --   --   --   --   --   --  11.6*  --   --   --   HGB 10.5* 9.4*   < > 7.5*   < > 7.9*   < > 7.3* 7.1* 8.1* 8.1* 8.0*  HCT 31.5* 28.8*   < > 23.5*   < > 25.4*   < > 23.3* 22.5* 25.4*  25.6* 25.6*  MCV 96.0 97.3  --  98.3  --  95.8  --   --  96.6  --   --   --   PLT 176 189  --  204  --  178  --   --  159  --   --   --    < > = values in this interval not displayed.   Basic Metabolic Panel: Recent Labs  Lab 01/25/21 0525 01/26/21 0427 01/27/21 0509 01/28/21 0422 01/29/21 0402  NA 144 143 144 144 142  K 3.4* 3.4* 3.9 4.5 4.8  CL 108 106 111 116* 115*  CO2 30 26 22 23  20*  GLUCOSE 208* 340* 333* 177* 180*  BUN 65* 72* 83* 95* 86*  CREATININE 1.30* 1.30* 1.29* 1.58* 1.69*  CALCIUM 8.5* 8.5* 8.3* 8.5* 8.4*  MG 2.6* 2.0 2.1 2.2 2.2  PHOS 1.9* 2.4* 3.4 3.7 4.8*   GFR: Estimated Creatinine Clearance: 45.2 mL/min (A) (by C-G formula based on SCr of 1.69 mg/dL (H)). Liver Function Tests: Recent Labs  Lab 01/24/21 0338 01/28/21 0422  AST 17 41  ALT 18 49*  ALKPHOS 42 46  BILITOT 1.1 0.8  PROT 5.6* 5.0*  ALBUMIN 2.7* 2.3*   No results for input(s): LIPASE, AMYLASE in the last 168 hours. No results for input(s): AMMONIA in the last 168 hours. Coagulation Profile: No results for input(s): INR, PROTIME in the last 168 hours. Cardiac Enzymes: No results for input(s): CKTOTAL, CKMB, CKMBINDEX, TROPONINI in the last 168 hours. BNP (last 3 results) No results for input(s): PROBNP in the last 8760 hours. HbA1C: No results for input(s): HGBA1C in the last 72 hours. CBG: Recent Labs  Lab 01/28/21 1628 01/28/21 2001 01/28/21 2348 01/29/21 0348 01/29/21 0734  GLUCAP 162* 153* 169* 170* 143*   Lipid Profile: Recent Labs    01/28/21 0422  TRIG 123   Thyroid Function Tests: No results for input(s): TSH, T4TOTAL, FREET4, T3FREE, THYROIDAB in the last 72 hours. Anemia Panel: No results for input(s): VITAMINB12, FOLATE, FERRITIN, TIBC, IRON, RETICCTPCT in the last 72 hours. Urine analysis:    Component Value Date/Time   COLORURINE AMBER (A) 01/19/2021 1622   APPEARANCEUR CLOUDY (A) 01/19/2021 1622   LABSPEC 1.018 01/19/2021 1622   PHURINE 5.0 01/19/2021  1622   GLUCOSEU NEGATIVE 01/19/2021 1622   HGBUR MODERATE (A) 01/19/2021 1622   BILIRUBINUR NEGATIVE 01/19/2021 1622   KETONESUR NEGATIVE 01/19/2021 1622   PROTEINUR 100 (A) 01/19/2021 1622   NITRITE NEGATIVE 01/19/2021 1622   LEUKOCYTESUR NEGATIVE 01/19/2021  1622   Sepsis Labs: @LABRCNTIP (procalcitonin:4,lacticidven:4)  )No results found for this or any previous visit (from the past 240 hour(s)).    Studies: DG Abd Portable 1V  Result Date: 01/29/2021 CLINICAL DATA:  Abdominal distention. EXAM: PORTABLE ABDOMEN - 1 VIEW COMPARISON:  01/27/2021. FINDINGS: Interim removal of NG tube. Persistent dilated loops of small bowel noted. The colon is slightly distended. Findings again suggest adynamic ileus. Partial small bowel obstruction again cannot be excluded. Continued follow-up exams suggested to demonstrate resolution of bowel distention. No free air. Bibasilar atelectasis. Degenerative changes lumbar spine and both hips. Pelvic calcifications consistent phleboliths. IMPRESSION: 1.  Interim removal of NG tube. 2. Persistent dilated loops of small bowel noted. The colon is slightly distended. Findings again suggest adynamic ileus. Partial small bowel obstruction again cannot be excluded. Continued follow-up exam suggested to demonstrate resolution of bowel distention. Electronically Signed   By: Marcello Moores  Register   On: 01/29/2021 06:40    Scheduled Meds: . allopurinol  100 mg Oral QHS  . bisacodyl  10 mg Rectal Daily  . Chlorhexidine Gluconate Cloth  6 each Topical Daily  . insulin aspart  0-15 Units Subcutaneous Q4H  . insulin glargine  8 Units Subcutaneous BID  . lip balm  1 application Topical BID  . mouth rinse  15 mL Mouth Rinse BID  . metoprolol succinate  12.5 mg Oral Daily  . [START ON 01/30/2021] pantoprazole  40 mg Intravenous Q12H  . simethicone  80 mg Oral QID  . sodium chloride flush  10-40 mL Intracatheter Q12H    Continuous Infusions: . sodium chloride 0 mL (01/29/21  0305)  . amiodarone 30 mg/hr (01/29/21 0400)  . pantoprozole (PROTONIX) infusion Stopped (01/29/21 0359)  . TPN ADULT (ION) Stopped (01/29/21 0358)     LOS: 13 days     Kayleen Memos, MD [facility] Pager [phone]  If 7PM-7AM, please contact night-coverage [email or web address] Password [facility] 01/29/2021, 7:52 AM

## 2021-01-29 NOTE — Progress Notes (Signed)
Notified by RN that pt has abdominal pain of 8/10. Has palpable firm area on left side of abdomen Reviewed chart.  Ordered dilaudid for pain Obtain CT scan abdomen and pelvis

## 2021-01-29 NOTE — Progress Notes (Signed)
Nutrition Follow-up  DOCUMENTATION CODES:   Obesity unspecified  INTERVENTION:  - will order Boost Breeze BID, each supplement provides 250 kcal and 9 grams of protein. - will order 30 ml Prosource Plus BID, each supplement provides 100 kcal and 15 grams protein.  - continue TPN per Pharmacist. - continue diet advancement as medically feasible.   NUTRITION DIAGNOSIS:   Inadequate oral intake related to inability to eat as evidenced by NPO status. -ongoing with diet advancement to CLD but very minimal intake  GOAL:   Patient will meet greater than or equal to 90% of their needs -met with TPN regimen  MONITOR:   PO intake,Supplement acceptance,Diet advancement,Labs,Weight trends,Other (Comment) (TPN regimen)  ASSESSMENT:   75 year old male with medical history of DM, HLD, lymphoma, HTN, GERD, hypotestosteronemia, and sleep apnea. 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 He underwent L TKA on 5/11.  Significant Events: 5/11- admission; L TKA 5/15- NGT placement 5/18- initial RD assessment 5/19- double lumen PICC placed in R basilic; TPN initiation 0/62- diet advanced to CLD 5/23- NGT removal   No intakes documented since diet advanced to CLD on 5/21. Patient sitting in chair with wife at his side. They both provide information/participate in conversation.  NGT was removed yesterday and overall he has had no abdominal discomfort since that time. Reports he had a large BM early this AM.   Breakfast tray was in front of him and mainly untouched. Patient reports having reflux with many items on CLD and that he has not experienced reflux prior to hospitalization. He is very interested in having solid foods and reports eating good food is one of his joys in life.   He is receiving custom TPN at goal rate of 80 ml/hr. This regimen is providing 1978 kcal and 106 grams protein. Able to talk with Pharmacist and plan is to continue this rate today.   Surgery note  today indicates persistent dilation of small bowel loops suggestive of ileus. Plan to continue CLD today.     Labs reviewed; CBGs: 170, 143, 155 mg/dl, Cl: 115 mmol/l, BUN: 86 mg/dl, creatinine: 1.69 mg/dl, Ca: 8.4 mg/d, Phos: 4.8 mg/dl, GFR: 42 ml/min. Medications reviewed; 100 mg colace/day, 40 mg IV lasix x1 dose 5/23, sliding scale novolog, 8 units lantus BID, 40 mg IV protonix BID.   Diet Order:   Diet Order            Diet clear liquid Room service appropriate? Yes; Fluid consistency: Thin  Diet effective now           Diet - low sodium heart healthy           Diet - low sodium heart healthy                 EDUCATION NEEDS:   Education needs have been addressed  Skin:  Skin Assessment: Skin Integrity Issues: Skin Integrity Issues:: Incisions Incisions: L knee (5/11)  Last BM:  5/24 (type 7 x1)  Height:   Ht Readings from Last 1 Encounters:  01/20/21 '5\' 8"'  (1.727 m)    Weight:   Wt Readings from Last 1 Encounters:  01/23/21 109.1 kg    Estimated Nutritional Needs:  Kcal:  1880-2090 kcal Protein:  90-100 grams Fluid:  >/= 2 L/day      Jarome Matin, MS, RD, LDN, CNSC Inpatient Clinical Dietitian RD pager # available in [place]  After hours/weekend pager # available in [facility]

## 2021-01-29 NOTE — Progress Notes (Addendum)
Progress Note  13 Days Post-Op  Subjective: CC: afebrile, hypertensive. He reports having a bowel movement yesterday and feels like he could have one again today if he is able to get up to a commode. He is motivated to move more though he does feel very weak. He is burping more today and with little appetite. Not sure if he is passing flatus. He denies nausea or emesis. He does have shortness of breath and is on supplemental O2  Objective: Vital signs in last 24 hours: Temp:  [98 F (36.7 C)-99.7 F (37.6 C)] 98.4 F (36.9 C) (05/24 0753) Pulse Rate:  [81-96] 81 (05/24 0600) Resp:  [26-29] 26 (05/24 0600) BP: (135-164)/(40-55) 135/50 (05/24 0600) SpO2:  [94 %-99 %] 99 % (05/24 0600) Last BM Date: 01/28/21  Intake/Output from previous day: 05/23 0701 - 05/24 0700 In: 3687.5 [I.V.:3057.5; Blood:630] Out: 3250 [Urine:3250] Intake/Output this shift: Total I/O In: -  Out: 500 [Urine:500]  PE: General: pleasant, ill appearing male who is sitting up in bed in NAD HEENT: head is normocephalic, atraumatic. Heart: regular, rate, and rhythm. Palpable radial and pedal pulses bilaterally Lungs: CTAB.  On supplemental O2 via [place] - increased effort with speech Abd: very distended. Moderately tender to palpation diffusely with guarding. No rebound. +BS in all 4 quadrants MS: upper extremity edema improved. Bilateral lower extremity edema persists. Skin: warm and dry  Psych: A&Ox3 with an appropriate affect.    Lab Results:  Recent Labs    01/27/21 0541 01/27/21 1154 01/28/21 0422 01/28/21 1516 01/28/21 2034 01/29/21 0402  WBC 17.4*  --  14.3*  --   --   --   HGB 7.9*   < > 7.1*   < > 8.1* 8.0*  HCT 25.4*   < > 22.5*   < > 25.6* 25.6*  PLT 178  --  159  --   --   --    < > = values in this interval not displayed.   BMET Recent Labs    01/28/21 0422 01/29/21 0402  NA 144 142  K 4.5 4.8  CL 116* 115*  CO2 23 20*  GLUCOSE 177* 180*  BUN 95* 86*  CREATININE 1.58* 1.69*   CALCIUM 8.5* 8.4*   PT/INR No results for input(s): LABPROT, INR in the last 72 hours. CMP     Component Value Date/Time   NA 142 01/29/2021 0402   NA 141 10/06/2016 0830   K 4.8 01/29/2021 0402   CL 115 (H) 01/29/2021 0402   CO2 20 (L) 01/29/2021 0402   GLUCOSE 180 (H) 01/29/2021 0402   BUN 86 (H) 01/29/2021 0402   BUN 29 (H) 10/06/2016 0830   CREATININE 1.69 (H) 01/29/2021 0402   CALCIUM 8.4 (L) 01/29/2021 0402   PROT 5.0 (L) 01/28/2021 0422   PROT 7.4 09/22/2016 0913   ALBUMIN 2.3 (L) 01/28/2021 0422   ALBUMIN 4.7 09/22/2016 0913   AST 41 01/28/2021 0422   ALT 49 (H) 01/28/2021 0422   ALKPHOS 46 01/28/2021 0422   BILITOT 0.8 01/28/2021 0422   BILITOT 1.0 09/22/2016 0913   GFRNONAA 42 (L) 01/29/2021 0402   GFRAA 59 (L) 04/29/2018 1012   Lipase  No results found for: LIPASE     Studies/Results: DG Abd Portable 1V  Result Date: 01/29/2021 CLINICAL DATA:  Abdominal distention. EXAM: PORTABLE ABDOMEN - 1 VIEW COMPARISON:  01/27/2021. FINDINGS: Interim removal of NG tube. Persistent dilated loops of small bowel noted. The colon is slightly distended. Findings  again suggest adynamic ileus. Partial small bowel obstruction again cannot be excluded. Continued follow-up exams suggested to demonstrate resolution of bowel distention. No free air. Bibasilar atelectasis. Degenerative changes lumbar spine and both hips. Pelvic calcifications consistent phleboliths. IMPRESSION: 1.  Interim removal of NG tube. 2. Persistent dilated loops of small bowel noted. The colon is slightly distended. Findings again suggest adynamic ileus. Partial small bowel obstruction again cannot be excluded. Continued follow-up exam suggested to demonstrate resolution of bowel distention. Electronically Signed   By: Marcello Moores  Register   On: 01/29/2021 06:40   DG Abd Portable 2V  Result Date: 01/27/2021 CLINICAL DATA:  Abdominal distension EXAM: PORTABLE ABDOMEN - 2 VIEW COMPARISON:  Jan 26, 2021 FINDINGS: The  NG tube terminates in the stomach. Air-filled loops of large and small bowel are identified. No free air, portal venous gas, or pneumatosis. IMPRESSION: Dilated air-filled loops of small bowel are again identified, with decreased distension in the interval. Air throughout the colon again identified. Findings suggest ileus versus partial small-bowel obstruction. Recommend attention on follow-up. Electronically Signed   By: Dorise Bullion III M.D   On: 01/27/2021 11:55    Anti-infectives: Anti-infectives (From admission, onward)   Start     Dose/Rate Route Frequency Ordered Stop   01/20/21 1830  vancomycin (VANCOREADY) IVPB 2000 mg/400 mL        2,000 mg 200 mL/hr over 120 Minutes Intravenous  Once 01/20/21 1738 01/20/21 2005   01/20/21 1737  vancomycin variable dose per unstable renal function (pharmacist dosing)  Status:  Discontinued         Does not apply See admin instructions 01/20/21 1738 01/22/21 0729   01/20/21 1400  cefTRIAXone (ROCEPHIN) 2 g in sodium chloride 0.9 % 100 mL IVPB        2 g 200 mL/hr over 30 Minutes Intravenous Every 24 hours 01/20/21 1347 01/25/21 1359   01/16/21 2000  vancomycin (VANCOREADY) IVPB 1250 mg/250 mL       Note to Pharmacy: Per pharmacy   1,250 mg 166.7 mL/hr over 90 Minutes Intravenous Every 12 hours 01/16/21 1329 01/16/21 2155   01/16/21 0600  vancomycin (VANCOREADY) IVPB 1500 mg/300 mL        1,500 mg 150 mL/hr over 120 Minutes Intravenous On call to O.R. 01/15/21 0839 01/16/21 1000   01/09/21 0600  vancomycin (VANCOREADY) IVPB 1500 mg/300 mL        1,500 mg 150 mL/hr over 120 Minutes Intravenous On call to O.R. 01/08/21 0806 01/10/21 0559   01/09/21 0600  gentamicin (GARAMYCIN) 410 mg in dextrose 5 % 100 mL IVPB        5 mg/kg  82.8 kg (Adjusted) 220.5 mL/hr over 30 Minutes Intravenous On call to O.R. 01/08/21 0813 01/10/21 0559       Assessment/Plan   Ileus s/p orthopedic surgery - NGT out 5/23 - tolerating clears  - Abd xray today with  persistent dilated loops of SB suggestive of ileus. Repeat xray in am -mobilize as tolerated to help recovery  - given xray findings, low appetite and tenderness on exam recommend continued TPN and remain on clears for now. Encouraged patient to go very slowly with oral intake  - getting daily suppository. Will add PO colace  Concern for upper GI bleeding gastritis.   Defer to gastroenterology.  Plan switch to more continuous PPIs for now in the hopes of stabilizing hemoglobin in the setting of chronic need for anticoagulation  FEN: clears, TPN VTE prophylaxis: SCDs  LOS: 13 days    [facility] 01/29/2021, 8:05 AM Please see Amion for pager number during day hours 7:00am-4:30pm

## 2021-01-29 NOTE — Progress Notes (Signed)
Physical Therapy Treatment Patient Details Name: [name] MRN: [id number] DOB: September 29, 1945 Today's Date: 01/29/2021    History of Present Illness Pt is a 75 year old male s/p left TKA on 01/16/21. Transferred to ICU on 5/15 due to respiratory failure, ileus, kidney failure    PT Comments    POD # 13 pt seen in ICU Pt was OOB in recliner earlier day now back to bed.  General bed mobility comments: pt in bed soaked urine assisted with side to side rolling twice for peri care and bed change pt <25% then scooted HOB Total Assist Performed TKR TE's. Total Joint Exercises Ankle Circles/Pumps: AROM;Both;10 reps;Supine Quad Sets: AROM;5 reps;Both;Supine Towel Squeeze: AROM;5 reps;Both Heel Slides: AAROM;10 reps;Left;Supine Hip ABduction/ADduction: Left;AAROM;Supine;10 reps Straight Leg Raises: AAROM;Left;10 reps;Supine   Follow Up Recommendations  Follow surgeon's recommendation for DC plan and follow-up therapies;SNF     Equipment Recommendations  Rolling walker with 5" wheels;Wheelchair (measurements PT)    Recommendations for Other Services       Precautions / Restrictions Precautions Precautions: Fall;Knee Precaution Comments: monitor vitals Restrictions Weight Bearing Restrictions: No LLE Weight Bearing: Weight bearing as tolerated    Mobility  Bed Mobility Overal bed mobility: Needs Assistance Bed Mobility: Rolling Rolling: Max assist;+2 for physical assistance         General bed mobility comments: pt in bed soaked urine assisted with side to side rolling twice for peri care and bed change pt <25% then scooted [facility] Total Assist    Transfers                    Ambulation/Gait                 Stairs             Wheelchair Mobility    Modified Rankin (Stroke Patients Only)       Balance                                            Cognition Arousal/Alertness: Awake/alert   Overall Cognitive Status: Within  Functional Limits for tasks assessed                                 General Comments: AxO x 2 groggy/tired but able to follow commands      Exercises Total Joint Exercises Ankle Circles/Pumps: AROM;Both;10 reps;Supine Quad Sets: AROM;5 reps;Both;Supine Towel Squeeze: AROM;5 reps;Both Heel Slides: AAROM;10 reps;Left;Supine Hip ABduction/ADduction: Left;AAROM;Supine;10 reps Straight Leg Raises: AAROM;Left;10 reps;Supine    General Comments        Pertinent Vitals/Pain Pain Assessment: Faces Faces Pain Scale: Hurts a little bit Pain Location: abdominal distention Pain Descriptors / Indicators: Discomfort;Tightness Pain Intervention(s): Monitored during session;Repositioned    Home Living                      Prior Function            PT Goals (current goals can now be found in the care plan section) Progress towards PT goals: Progressing toward goals    Frequency    Min 3X/week      PT Plan Frequency needs to be updated    Co-evaluation              AM-PAC PT "6 Clicks" Mobility  Outcome Measure  Help needed turning from your back to your side while in a flat bed without using bedrails?: A Lot Help needed moving from lying on your back to sitting on the side of a flat bed without using bedrails?: A Lot Help needed moving to and from a bed to a chair (including a wheelchair)?: A Lot Help needed standing up from a chair using your arms (e.g., wheelchair or bedside chair)?: A Lot Help needed to walk in hospital room?: A Lot Help needed climbing 3-5 steps with a railing? : Total 6 Click Score: 11    End of Session   Activity Tolerance: Patient limited by fatigue Patient left: in bed;with call bell/phone within reach;with family/visitor present Nurse Communication: Mobility status PT Visit Diagnosis: Other abnormalities of gait and mobility (R26.89)     Time: 1420-1445 PT Time Calculation (min) (ACUTE ONLY): 25 min  Charges:   $Therapeutic Exercise: 8-22 mins $Therapeutic Activity: 8-22 mins                     Rica Koyanagi  PTA Acute  Rehabilitation Services Pager      [phone] Office      [phone]

## 2021-01-30 ENCOUNTER — Inpatient Hospital Stay (HOSPITAL_COMMUNITY): Payer: Medicare Other

## 2021-01-30 DIAGNOSIS — R601 Generalized edema: Secondary | ICD-10-CM | POA: Diagnosis not present

## 2021-01-30 DIAGNOSIS — I480 Paroxysmal atrial fibrillation: Secondary | ICD-10-CM | POA: Diagnosis not present

## 2021-01-30 DIAGNOSIS — N179 Acute kidney failure, unspecified: Secondary | ICD-10-CM | POA: Diagnosis not present

## 2021-01-30 DIAGNOSIS — K567 Ileus, unspecified: Secondary | ICD-10-CM | POA: Diagnosis not present

## 2021-01-30 LAB — CBC
HCT: 24.9 % — ABNORMAL LOW (ref 39.0–52.0)
Hemoglobin: 7.7 g/dL — ABNORMAL LOW (ref 13.0–17.0)
MCH: 29.8 pg (ref 26.0–34.0)
MCHC: 30.9 g/dL (ref 30.0–36.0)
MCV: 96.5 fL (ref 80.0–100.0)
Platelets: 202 10*3/uL (ref 150–400)
RBC: 2.58 MIL/uL — ABNORMAL LOW (ref 4.22–5.81)
RDW: 18.7 % — ABNORMAL HIGH (ref 11.5–15.5)
WBC: 10.8 10*3/uL — ABNORMAL HIGH (ref 4.0–10.5)
nRBC: 0.4 % — ABNORMAL HIGH (ref 0.0–0.2)

## 2021-01-30 LAB — BASIC METABOLIC PANEL
Anion gap: 7 (ref 5–15)
BUN: 98 mg/dL — ABNORMAL HIGH (ref 8–23)
CO2: 21 mmol/L — ABNORMAL LOW (ref 22–32)
Calcium: 8.3 mg/dL — ABNORMAL LOW (ref 8.9–10.3)
Chloride: 113 mmol/L — ABNORMAL HIGH (ref 98–111)
Creatinine, Ser: 1.8 mg/dL — ABNORMAL HIGH (ref 0.61–1.24)
GFR, Estimated: 39 mL/min — ABNORMAL LOW (ref >60)
Glucose, Bld: 164 mg/dL — ABNORMAL HIGH (ref 70–99)
Potassium: 4.9 mmol/L (ref 3.5–5.1)
Sodium: 141 mmol/L (ref 135–145)

## 2021-01-30 LAB — BLOOD GAS, VENOUS
Acid-base deficit: 7.6 mmol/L — ABNORMAL HIGH (ref 0.0–2.0)
Bicarbonate: 19.1 mmol/L — ABNORMAL LOW (ref 20.0–28.0)
FIO2: 21
O2 Saturation: 89.1 %
Patient temperature: 98.6
pCO2, Ven: 48.7 mmHg (ref 44.0–60.0)
pH, Ven: 7.218 — ABNORMAL LOW (ref 7.250–7.430)
pO2, Ven: 70 mmHg — ABNORMAL HIGH (ref 32.0–45.0)

## 2021-01-30 LAB — GLUCOSE, CAPILLARY
Glucose-Capillary: 124 mg/dL — ABNORMAL HIGH (ref 70–99)
Glucose-Capillary: 133 mg/dL — ABNORMAL HIGH (ref 70–99)
Glucose-Capillary: 147 mg/dL — ABNORMAL HIGH (ref 70–99)
Glucose-Capillary: 159 mg/dL — ABNORMAL HIGH (ref 70–99)
Glucose-Capillary: 170 mg/dL — ABNORMAL HIGH (ref 70–99)
Glucose-Capillary: 172 mg/dL — ABNORMAL HIGH (ref 70–99)

## 2021-01-30 LAB — HEMOGLOBIN AND HEMATOCRIT, BLOOD
HCT: 24.9 % — ABNORMAL LOW (ref 39.0–52.0)
HCT: 23.7 % — ABNORMAL LOW (ref 39.0–52.0)
HCT: 23.9 % — ABNORMAL LOW (ref 39.0–52.0)
Hemoglobin: 7.7 g/dL — ABNORMAL LOW (ref 13.0–17.0)
Hemoglobin: 7.4 g/dL — ABNORMAL LOW (ref 13.0–17.0)
Hemoglobin: 8 g/dL — ABNORMAL LOW (ref 13.0–17.0)

## 2021-01-30 LAB — MAGNESIUM: Magnesium: 2.2 mg/dL (ref 1.7–2.4)

## 2021-01-30 LAB — PREPARE RBC (CROSSMATCH)

## 2021-01-30 LAB — AMMONIA: Ammonia: 71 umol/L — ABNORMAL HIGH (ref 9–35)

## 2021-01-30 LAB — PHOSPHORUS: Phosphorus: 4.2 mg/dL (ref 2.5–4.6)

## 2021-01-30 MED ORDER — SODIUM CHLORIDE 0.9% IV SOLUTION: Freq: Once | INTRAVENOUS | Status: AC

## 2021-01-30 MED ORDER — FUROSEMIDE 10 MG/ML IJ SOLN
40.0000 mg | Freq: Two times a day (BID) | INTRAMUSCULAR | Status: DC
  Administered 2021-01-30 – 2021-02-03 (×7): 40 mg via INTRAVENOUS
  Filled 2021-01-30 (×7): qty 4

## 2021-01-30 MED ORDER — TRAVASOL 10 % IV SOLN
INTRAVENOUS | Status: AC
  Filled 2021-01-30: qty 1056

## 2021-01-30 MED ORDER — SODIUM BICARBONATE 8.4 % IV SOLN
INTRAVENOUS | Status: AC
  Filled 2021-01-30: qty 50

## 2021-01-30 MED ORDER — LACTULOSE ENEMA
300.0000 mL | Freq: Once | ORAL | Status: AC
  Administered 2021-01-30: 300 mL via RECTAL
  Filled 2021-01-30: qty 300

## 2021-01-30 MED ORDER — HYDROMORPHONE HCL 1 MG/ML IJ SOLN: 0.5000 mg | INTRAMUSCULAR | Status: DC | PRN

## 2021-01-30 MED ORDER — SODIUM BICARBONATE 8.4 % IV SOLN
100.0000 meq | Freq: Once | INTRAVENOUS | Status: AC
  Administered 2021-01-30: 100 meq via INTRAVENOUS
  Filled 2021-01-30: qty 50

## 2021-01-30 MED ORDER — AMIODARONE HCL 200 MG PO TABS
200.0000 mg | ORAL_TABLET | Freq: Two times a day (BID) | ORAL | Status: DC
  Administered 2021-01-30 – 2021-02-01 (×4): 200 mg via ORAL
  Filled 2021-01-30 (×4): qty 1

## 2021-01-30 NOTE — Progress Notes (Signed)
PT Cancellation Note  Patient Details Name: [name] MRN: [id number] DOB: 1946-04-24   Cancelled Treatment:     Pt unable today due to medical. Will continue to follow for progress.    Nathanial Rancher 01/30/2021, 1:01 PM

## 2021-01-30 NOTE — Progress Notes (Addendum)
Subjective: 14 Days Post-Op Procedure(s) (LRB): TOTAL KNEE ARTHROPLASTY (Left) Patient reports pain as mild.  Per wife and nurse patient seems to be having a harder time today  Objective: Vital signs in last 24 hours: Temp:  [97.9 F (36.6 C)-100.4 F (38 C)] 98.9 F (37.2 C) (05/25 0800) Pulse Rate:  [71-87] 77 (05/25 1000) Resp:  [23-36] 25 (05/25 1000) BP: (118-168)/(37-63) 118/37 (05/25 1000) SpO2:  [95 %-100 %] 98 % (05/25 1000) Weight:  [112.2 kg] 112.2 kg (05/24 1400)  Intake/Output from previous day: 05/24 0701 - 05/25 0700 In: 2176.1 [I.V.:2176.1] Out: 6222 [Urine:1650] Intake/Output this shift: No intake/output data recorded.  Recent Labs    01/28/21 2034 01/29/21 0402 01/29/21 0914 01/29/21 1800 01/30/21 0247  HGB 8.1* 8.0* 8.5* 8.1* 7.7*  7.7*   Recent Labs    01/29/21 0914 01/29/21 1800 01/30/21 0247  WBC 11.2*  --  10.8*  RBC 2.85*  --  2.58*  HCT 27.1* 25.4* 24.9*  24.9*  PLT 189  --  202   Recent Labs    01/29/21 0402 01/30/21 0247  NA 142 141  K 4.8 4.9  CL 115* 113*  CO2 20* 21*  BUN 86* 98*  CREATININE 1.69* 1.80*  GLUCOSE 180* 164*  CALCIUM 8.4* 8.3*   No results for input(s): LABPT, INR in the last 72 hours.  Neurologically intact ABD soft Neurovascular intact Sensation intact distally Intact pulses distally Dorsiflexion/Plantar flexion intact Incision: dressing C/D/I and no drainage No cellulitis present Compartment soft Staples removed and steri strips placed, incision healing well  Assessment/Plan: 14 Days Post-Op Procedure(s) (LRB): TOTAL KNEE ARTHROPLASTY (Left) Knee stable Rest of medical care per consulting teams- appreciate all input Pt will likely require SNF for D/C Discussed with Dr. Valentino Saxon [name] 01/30/2021, 10:22 AM

## 2021-01-30 NOTE — Progress Notes (Signed)
Progress Note  14 Days Post-Op  Subjective: CC: overnight events noted. Tmax 100.34F overnight, BP stable. Multiple BMs yesterday and per nurse and his own report they were black tarry. The abdominal pain he had last night is much improved this am - he describes it as "sore." He did sip on clears yesterday and did not have nausea or emesis. He feel short of breath on supplemental O2  Objective: Vital signs in last 24 hours: Temp:  [97.9 F (36.6 C)-100.4 F (38 C)] 98.9 F (37.2 C) (05/25 0800) Pulse Rate:  [71-87] 87 (05/25 0800) Resp:  [23-36] 31 (05/25 0800) BP: (126-168)/(46-63) 140/50 (05/25 0800) SpO2:  [95 %-100 %] 98 % (05/25 0800) Weight:  [112.2 kg] 112.2 kg (05/24 1400) Last BM Date: 01/30/21  Intake/Output from previous day: 05/24 0701 - 05/25 0700 In: 2176.1 [I.V.:2176.1] Out: 7035 [Urine:1650] Intake/Output this shift: No intake/output data recorded.  PE: General: pleasant, ill appearing male who is sitting up in chair in NAD HEENT: head is normocephalic, atraumatic. Heart: regular, rate, and rhythm. Palpable radial and pedal pulses bilaterally Lungs: CTAB.  On supplemental O2 via [place]. Respiratory effort nonlabored. Abd: very distended. Mildly tender to palpation diffusely without guarding or rebound. +BS in all 4 quadrants. Lateral subcutaneous edema present MS: bilateral upper and lower extremity edema - LUE>RUE. LLE dressing c/d/i Skin: warm and dry  Psych: A&Ox3 with an appropriate affect.    Lab Results:  Recent Labs    01/29/21 0914 01/29/21 1800 01/30/21 0247  WBC 11.2*  --  10.8*  HGB 8.5* 8.1* 7.7*  7.7*  HCT 27.1* 25.4* 24.9*  24.9*  PLT 189  --  202   BMET Recent Labs    01/29/21 0402 01/30/21 0247  NA 142 141  K 4.8 4.9  CL 115* 113*  CO2 20* 21*  GLUCOSE 180* 164*  BUN 86* 98*  CREATININE 1.69* 1.80*  CALCIUM 8.4* 8.3*   PT/INR No results for input(s): LABPROT, INR in the last 72 hours. CMP     Component Value Date/Time    NA 141 01/30/2021 0247   NA 141 10/06/2016 0830   K 4.9 01/30/2021 0247   CL 113 (H) 01/30/2021 0247   CO2 21 (L) 01/30/2021 0247   GLUCOSE 164 (H) 01/30/2021 0247   BUN 98 (H) 01/30/2021 0247   BUN 29 (H) 10/06/2016 0830   CREATININE 1.80 (H) 01/30/2021 0247   CALCIUM 8.3 (L) 01/30/2021 0247   PROT 5.0 (L) 01/28/2021 0422   PROT 7.4 09/22/2016 0913   ALBUMIN 2.3 (L) 01/28/2021 0422   ALBUMIN 4.7 09/22/2016 0913   AST 41 01/28/2021 0422   ALT 49 (H) 01/28/2021 0422   ALKPHOS 46 01/28/2021 0422   BILITOT 0.8 01/28/2021 0422   BILITOT 1.0 09/22/2016 0913   GFRNONAA 39 (L) 01/30/2021 0247   GFRAA 59 (L) 04/29/2018 1012   Lipase  No results found for: LIPASE     Studies/Results: CT ABDOMEN PELVIS WO CONTRAST  Result Date: 01/30/2021 CLINICAL DATA:  Ileus. EXAM: CT ABDOMEN AND PELVIS WITHOUT CONTRAST TECHNIQUE: Multidetector CT imaging of the abdomen and pelvis was performed following the standard protocol without IV contrast. COMPARISON:  01/26/2021 FINDINGS: Lower chest: Bibasilar atelectasis, left greater than right. Stable elevation of the left hemidiaphragm. Extensive coronary artery calcification. Hypoattenuation of the cardiac blood pool is in keeping with at least mild anemia. Mild global cardiomegaly again noted. Hepatobiliary: No focal liver abnormality is seen. No gallstones, gallbladder wall thickening, or biliary dilatation.  Pancreas: Unremarkable Spleen: Unremarkable Adrenals/Urinary Tract: The adrenal glands are unremarkable. The kidneys are normal in size and position. Multiple simple cortical cysts are seen bilaterally measuring up to 4.2 cm in diameter on the right and 4.5 cm in diameter on the left. There is no hydronephrosis. No intrarenal or ureteral calcifications are seen. The bladder is unremarkable. Stomach/Bowel: The stomach is distended. The small bowel is diffusely mildly dilated and gas and fluid-filled. The ascending colon and transverse colon are,  additionally, mildly distended with gas. The findings, together, are in keeping with an underlying ileus. Moderate to severe sigmoid diverticulosis. The appendix is unremarkable. No free intraperitoneal gas. Trace ascites is present. Vascular/Lymphatic: Mild aortoiliac atherosclerotic calcification. No aortic aneurysm. No pathologic adenopathy within the abdomen and pelvis. Reproductive: Moderate prostatic enlargement. Seminal vesicles are unremarkable. Other: There is moderate diffuse subcutaneous body wall edema. No abdominal wall hernia. The rectum contains oral contrast from prior CT examination of 01/26/2021. Musculoskeletal: Osseous structures are age-appropriate. No acute bone abnormality. No lytic or blastic bone lesion. IMPRESSION: Stable elevation of the left hemidiaphragm. Stable cardiomegaly.  Findings in keeping with at least mild anemia. Interval removal of the nasogastric tube with distension of the stomach. Diffusely distended small and large bowel in keeping with underlying ileus, similar to that noted on prior examination. Diffuse body wall subcutaneous edema and mild ascites in keeping with anasarca. Aortic Atherosclerosis (ICD10-I70.0). Electronically Signed   By: Fidela Salisbury MD   On: 01/30/2021 01:58   DG Abd Portable 1V  Result Date: 01/29/2021 CLINICAL DATA:  Abdominal distention. EXAM: PORTABLE ABDOMEN - 1 VIEW COMPARISON:  01/27/2021. FINDINGS: Interim removal of NG tube. Persistent dilated loops of small bowel noted. The colon is slightly distended. Findings again suggest adynamic ileus. Partial small bowel obstruction again cannot be excluded. Continued follow-up exams suggested to demonstrate resolution of bowel distention. No free air. Bibasilar atelectasis. Degenerative changes lumbar spine and both hips. Pelvic calcifications consistent phleboliths. IMPRESSION: 1.  Interim removal of NG tube. 2. Persistent dilated loops of small bowel noted. The colon is slightly distended.  Findings again suggest adynamic ileus. Partial small bowel obstruction again cannot be excluded. Continued follow-up exam suggested to demonstrate resolution of bowel distention. Electronically Signed   By: Marcello Moores  Register   On: 01/29/2021 06:40    Anti-infectives: Anti-infectives (From admission, onward)   Start     Dose/Rate Route Frequency Ordered Stop   01/20/21 1830  vancomycin (VANCOREADY) IVPB 2000 mg/400 mL        2,000 mg 200 mL/hr over 120 Minutes Intravenous  Once 01/20/21 1738 01/20/21 2005   01/20/21 1737  vancomycin variable dose per unstable renal function (pharmacist dosing)  Status:  Discontinued         Does not apply See admin instructions 01/20/21 1738 01/22/21 0729   01/20/21 1400  cefTRIAXone (ROCEPHIN) 2 g in sodium chloride 0.9 % 100 mL IVPB        2 g 200 mL/hr over 30 Minutes Intravenous Every 24 hours 01/20/21 1347 01/25/21 1359   01/16/21 2000  vancomycin (VANCOREADY) IVPB 1250 mg/250 mL       Note to Pharmacy: Per pharmacy   1,250 mg 166.7 mL/hr over 90 Minutes Intravenous Every 12 hours 01/16/21 1329 01/16/21 2155   01/16/21 0600  vancomycin (VANCOREADY) IVPB 1500 mg/300 mL        1,500 mg 150 mL/hr over 120 Minutes Intravenous On call to O.R. 01/15/21 0839 01/16/21 1000   01/09/21 0600  vancomycin (VANCOREADY)  IVPB 1500 mg/300 mL        1,500 mg 150 mL/hr over 120 Minutes Intravenous On call to O.R. 01/08/21 3748 01/10/21 0559   01/09/21 0600  gentamicin (GARAMYCIN) 410 mg in dextrose 5 % 100 mL IVPB        5 mg/kg  82.8 kg (Adjusted) 220.5 mL/hr over 30 Minutes Intravenous On call to O.R. 01/08/21 0813 01/10/21 0559       Assessment/Plan   Ileus s/p orthopedic surgery - NGT out 5/23 - continues to tolerate clears  - CT overnight with distension of stomach, small and large bowel - ileus similar to prior CT (5/21). Oral contrast in rectum from prior CT - mobilize as tolerated to help recovery  - continue very slow small amount PO intake and  recommend continued TPN - getting daily suppository. PO colace added 5/24  Concern for upper GI bleeding gastritis.   - Hgb has drifted down - now 7.7 from 8.5 in the setting of black tarry stools. At this point he could benefit from endoscopic evaluation at the discretion of GI. - continuous PPIs  FEN: clears, TPN VTE prophylaxis: SCDs    LOS: 14 days    Winferd Humphrey, [facility] 01/30/2021, 8:54 AM Please see Amion for pager number during day hours 7:00am-4:30pm

## 2021-01-30 NOTE — H&P (View-Only) (Signed)
[facility] Progress Note CC:  Ileus, upper GI bleed  Subjective: He had 8/10 left mid abdominal pain last night. See CTAP results below. No current abdominal pain. He is taking minimal sips of fluids, quantity too small to measure. Lauren RN reported patient passed 2 moderate volume black loose stools yesterday and at 6am today. He was in the chair earlier today, now back in bed. Wife is at the bedside.   Objective:   CTAP WO contrast 01/30/2021: Stable elevation of the left hemidiaphragm. Stable cardiomegaly.  Findings in keeping with at least mild anemia. Interval removal of the nasogastric tube with distension of the stomach. Diffusely distended small and large bowel in keeping with underlying ileus, similar to that noted on prior examination. Diffuse body wall subcutaneous edema and mild ascites in keeping with anasarca. Aortic Atherosclerosis   Vital signs in last 24 hours: Temp:  [97.9 F (36.6 C)-100.4 F (38 C)] 98.9 F (37.2 C) (05/25 0800) Pulse Rate:  [71-87] 77 (05/25 1000) Resp:  [23-36] 25 (05/25 1000) BP: (118-168)/(37-63) 118/37 (05/25 1000) SpO2:  [95 %-100 %] 98 % (05/25 1000) Weight:  [112.2 kg] 112.2 kg (05/25 1027) Last BM Date: 01/30/21 General: Fatigued ill-appearing 75 year old male. Heart: Regular rate and rhythm, no no murmurs. Pulm: Breath sounds clear, decreased in the bases. Abdomen: Abdomen remains distended, soft.  Currently nontender.  Tympanic to percussion.  Very few bowel sounds auscultated. Rectal: Melenic loose stool in rectal vault grossly guaiac positive. Lauren RN present at time of exam.  Extremities: LES with 2+ pitting edema.  Neurologic:  Alert and  oriented x4;  grossly normal neurologically. Psych:  Alert and cooperative. Normal mood and affect.  Lab Results: Recent Labs    01/28/21 0422 01/28/21 1516 01/29/21 0914 01/29/21 1800 01/30/21 0247 01/30/21 1006  WBC 14.3*  --  11.2*  --  10.8*  --   HGB  7.1*   < > 8.5* 8.1* 7.7*  7.7* 7.4*  HCT 22.5*   < > 27.1* 25.4* 24.9*  24.9* 23.7*  PLT 159  --  189  --  202  --    < > = values in this interval not displayed.   BMET Recent Labs    01/28/21 0422 01/29/21 0402 01/30/21 0247  NA 144 142 141  K 4.5 4.8 4.9  CL 116* 115* 113*  CO2 23 20* 21*  GLUCOSE 177* 180* 164*  BUN 95* 86* 98*  CREATININE 1.58* 1.69* 1.80*  CALCIUM 8.5* 8.4* 8.3*   LFT Recent Labs    01/28/21 0422  PROT 5.0*  ALBUMIN 2.3*  AST 41  ALT 49*  ALKPHOS 46  BILITOT 0.8   PT/INR No results for input(s): LABPROT, INR in the last 72 hours. Hepatitis Panel No results for input(s): HEPBSAG, HCVAB, HEPAIGM, HEPBIGM in the last 72 hours.  CT ABDOMEN PELVIS WO CONTRAST  Result Date: 01/30/2021 CLINICAL DATA:  Ileus. EXAM: CT ABDOMEN AND PELVIS WITHOUT CONTRAST TECHNIQUE: Multidetector CT imaging of the abdomen and pelvis was performed following the standard protocol without IV contrast. COMPARISON:  01/26/2021 FINDINGS: Lower chest: Bibasilar atelectasis, left greater than right. Stable elevation of the left hemidiaphragm. Extensive coronary artery calcification. Hypoattenuation of the cardiac blood pool is in keeping with at least mild anemia. Mild global cardiomegaly again noted. Hepatobiliary: No focal liver abnormality is seen. No gallstones, gallbladder wall thickening, or biliary dilatation. Pancreas: Unremarkable Spleen: Unremarkable Adrenals/Urinary Tract: The adrenal glands are unremarkable. The kidneys are normal in size  and position. Multiple simple cortical cysts are seen bilaterally measuring up to 4.2 cm in diameter on the right and 4.5 cm in diameter on the left. There is no hydronephrosis. No intrarenal or ureteral calcifications are seen. The bladder is unremarkable. Stomach/Bowel: The stomach is distended. The small bowel is diffusely mildly dilated and gas and fluid-filled. The ascending colon and transverse colon are, additionally, mildly distended  with gas. The findings, together, are in keeping with an underlying ileus. Moderate to severe sigmoid diverticulosis. The appendix is unremarkable. No free intraperitoneal gas. Trace ascites is present. Vascular/Lymphatic: Mild aortoiliac atherosclerotic calcification. No aortic aneurysm. No pathologic adenopathy within the abdomen and pelvis. Reproductive: Moderate prostatic enlargement. Seminal vesicles are unremarkable. Other: There is moderate diffuse subcutaneous body wall edema. No abdominal wall hernia. The rectum contains oral contrast from prior CT examination of 01/26/2021. Musculoskeletal: Osseous structures are age-appropriate. No acute bone abnormality. No lytic or blastic bone lesion. IMPRESSION: Stable elevation of the left hemidiaphragm. Stable cardiomegaly.  Findings in keeping with at least mild anemia. Interval removal of the nasogastric tube with distension of the stomach. Diffusely distended small and large bowel in keeping with underlying ileus, similar to that noted on prior examination. Diffuse body wall subcutaneous edema and mild ascites in keeping with anasarca. Aortic Atherosclerosis (ICD10-I70.0). Electronically Signed   By: Fidela Salisbury MD   On: 01/30/2021 01:58   DG Abd Portable 1V  Result Date: 01/29/2021 CLINICAL DATA:  Abdominal distention. EXAM: PORTABLE ABDOMEN - 1 VIEW COMPARISON:  01/27/2021. FINDINGS: Interim removal of NG tube. Persistent dilated loops of small bowel noted. The colon is slightly distended. Findings again suggest adynamic ileus. Partial small bowel obstruction again cannot be excluded. Continued follow-up exams suggested to demonstrate resolution of bowel distention. No free air. Bibasilar atelectasis. Degenerative changes lumbar spine and both hips. Pelvic calcifications consistent phleboliths. IMPRESSION: 1.  Interim removal of NG tube. 2. Persistent dilated loops of small bowel noted. The colon is slightly distended. Findings again suggest adynamic  ileus. Partial small bowel obstruction again cannot be excluded. Continued follow-up exam suggested to demonstrate resolution of bowel distention. Electronically Signed   By: Marcello Moores  Register   On: 01/29/2021 06:40    Assessment / Plan:  [age] year old male s/p left TKA 01/16/2021 who developed a post opIleus, improving. TodayK+ 4.9. Magnesium 2.2. NGT was removed 5/23. Tolerating few sips of clear liquids. No N/V. He is having some burping. He developed left mid abdominal pain 8/10 yesterday evening which resolved after he received Dilaudid. CTAP WO contrast done this am showed stomach distension, diffusely distended small and large bowel consistent with persistent ileus (similar to that noted on prior examination). He passed a loose black stool this am.  -OOB to chair as tolerated -Turn Q 2 hours -Continue TPN -NPO -NGT to be placed by RN, check KUB post placement then NGT to LIS -Dulcolax suppository Q HS -Simethicone as needed -Repeat K+, Magnesium level in am.  -Further recommendations per Dr. Ardis Hughs   2. Upper GI bleed, coffee ground output per NGT on 5/20.UGIB likely due to NGT trauma.Heparin on hold. Hg dropped to 6.8 ->received 2 units of PRBCs on 5/21. Hg 7.3 -.>7.1. Transfused one unit of PRBCs -> Hg 8.1 ->  01/29/2021 Hg -> 8.0 -> 8.5 -> 8.1 Today Hg 7.7 ->. He passed 2 loose black stools on 5/24 and one loose black stool at 6am.  -Continue to monitor H/H closley -Transfuse for Hg < 7.  -Continue PPI IV BID -  Further recommendations regarding consideration for endoscopic evaluation per Dr. Ardis Hughs  ADDENDUM: EGD with Dr. Ardis Hughs scheduled tomorrow. EGD benefits and risks discussed including risk with sedation, risk of bleeding, perforation and infection. I discussed plans for EGD with Mr. Cumming and his wife. Our service will check on him in the morning to ensure he is medically stable to proceed with an EGD.   3. Anemia.  -See plan in # 2  4. Afib with RVRon Amiodarone  infusion. Heparin on hold. Cardiology plans on starting Eliquis when Hg stabilizes and when cleared by our GI service   5. Nonobstructive CAD  6. Respiratory failure, pneumonia, on oxygen 2L [place].    LOS: 13 days     LOS: 14 days   Colleen M Kennedy-Smith  01/30/2021, 10:44 AM   ________________________________________________________________________  Velora Heckler GI MD note:  I personally examined the patient, reviewed the data and agree with the assessment and plan described above.  Very slow to resolve ileus.  We are reintroducing an NG tube today and since he is having some quite dark stools and his hemoglobin is trending downward I am proceeding with EGD tomorrow.   Owens Loffler, MD [facility] Pager [phone]

## 2021-01-30 NOTE — Progress Notes (Signed)
PHARMACY - TOTAL PARENTERAL NUTRITION CONSULT NOTE   Indication: Prolonged ileus  Patient Measurements: Height: 5\' 8"  (172.7 cm) Weight: 112.2 kg (247 lb 5.7 oz) IBW/kg (Calculated) : 68.4 TPN AdjBW (KG): 77.4 Body mass index is 37.61 kg/m. 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 Post op course complicated by Afib, aspiration PNA and prolonged ileus. Pharmacy consulted to manage TPN for nutrition support.  Glucose / Insulin: Hx DM2 on only metformin PTA, A1c 5.7.  Solucortef d/c 5/20  Serum Glucose with AM labs: 164; CBG range 143-159   Regular Insulin in TPN:  15 units (started 5/21)  Lantus 8 qday to 8 q12 (increased by TRH on 5/22)  Moderate SSI 15 units/24 hrs  Electrolytes: K up to 4.9 (AKI), goal >=4; Mg 2.2; Phos improved to 4.2, CoCa 9.66; Cl elevated at 113, CO2 21 Renal: SCr up to 1.8 (lasix 5/23, contrast 5/24 PM); BUN up to 98  Postop AKI w/ SCr peaking at 4 (baseline 1.4) , then was improving, but now increasing again.  Continues to have diffuse edema, anasarca.  Hepatic: LFTs stable WNL; mild bump in tbili resolved; Trig 98 (5/10)> 123 (5/23) Preabumin 10 (5/19), 13.5 (5/23) I/O, mIVF:  IVF NS 10 ml/hr, Amio gtt at 17 ml/hr  I/O net +437 mL; UOP 3250 mL, no vomiting.  One brown/black liquid stool  Relistor x2 (5/15, 5/17), Bisacodyl supp daily (5/21 > ).  Scheduled IV reglan (5/15-5/23) and miralax BID (5/18-5/21).  Docusate daily (5/24) GI Imaging:  - 5/18 CT A/P: ileus vs distal SBO -5/19 KUB: diffuse gaseous bowel distention persists -5/20 KUB: persistent & stable SBO -5/21 KUB: unchanged gaseous distention of colon & SB, contrast from 2 days ago in cecum - 5/24 CT: Diffusely distended small and large bowel in keeping with underlying ileus. Diffuse body wall subcutaneous edema, mild ascites in keeping with anasarca. GI Surgeries / Procedures: none  Central access: CVC; PICC placed 5/19 for TPN TPN start date:  01/23/21  Nutritional Goals (RD recommendations 5/18):  Kcal:  1880-2090 kcal, Protein:  90-100 grams, Fluid:  >/= 2 L/day  Current TPN at goal rate of 80 mL/hr provides 106 g of protein, 1978 kcals per day  Keep K >/= 4 and Mg >/=2 with ileus  Current Nutrition:  TPN at goal rate Diet: CLD (5/21, do not advance given ileus, GI pain, concern for ongoing GIB) Feeding supplements: Boost/Breeze BID (5/24), Prosource Plus BID  Plan:   Continue TPN @ 44mL/hr - provides 100% of goals  Electrolytes in TPN:  Na - 49mEq/L  K - 20 mEq/L  Ca - 37mEq/L  Mg - 5 mEq/L  Phos - 5 mmol/L  Cl:Ac ratio: max Acetate  Add standard MVI and trace elements to TPN  Continue moderate scale SSI q4h and adjust as needed  Increase to regular insulin 28 units in TPN   Discontinue Lantus (discussed with TRH, move all insulin to TPN since very little PO intake)  M[facility]F NS at [facility] per MD  Monitor TPN labs on Mon/Thurs   Gretta Arab PharmD, BCPS Clinical Pharmacist [facility] main pharmacy [phone] 01/30/2021 7:31 AM

## 2021-01-30 NOTE — Progress Notes (Addendum)
[facility] Progress Note CC:  Ileus, upper GI bleed  Subjective: He had 8/10 left mid abdominal pain last night. See CTAP results below. No current abdominal pain. He is taking minimal sips of fluids, quantity too small to measure. Lauren RN reported patient passed 2 moderate volume black loose stools yesterday and at 6am today. He was in the chair earlier today, now back in bed. Wife is at the bedside.   Objective:   CTAP WO contrast 01/30/2021: Stable elevation of the left hemidiaphragm. Stable cardiomegaly.  Findings in keeping with at least mild anemia. Interval removal of the nasogastric tube with distension of the stomach. Diffusely distended small and large bowel in keeping with underlying ileus, similar to that noted on prior examination. Diffuse body wall subcutaneous edema and mild ascites in keeping with anasarca. Aortic Atherosclerosis   Vital signs in last 24 hours: Temp:  [97.9 F (36.6 C)-100.4 F (38 C)] 98.9 F (37.2 C) (05/25 0800) Pulse Rate:  [71-87] 77 (05/25 1000) Resp:  [23-36] 25 (05/25 1000) BP: (118-168)/(37-63) 118/37 (05/25 1000) SpO2:  [95 %-100 %] 98 % (05/25 1000) Weight:  [112.2 kg] 112.2 kg (05/25 1027) Last BM Date: 01/30/21 General: Fatigued ill-appearing 75 year old male. Heart: Regular rate and rhythm, no no murmurs. Pulm: Breath sounds clear, decreased in the bases. Abdomen: Abdomen remains distended, soft.  Currently nontender.  Tympanic to percussion.  Very few bowel sounds auscultated. Rectal: Melenic loose stool in rectal vault grossly guaiac positive. Lauren RN present at time of exam.  Extremities: LES with 2+ pitting edema.  Neurologic:  Alert and  oriented x4;  grossly normal neurologically. Psych:  Alert and cooperative. Normal mood and affect.  Lab Results: Recent Labs    01/28/21 0422 01/28/21 1516 01/29/21 0914 01/29/21 1800 01/30/21 0247 01/30/21 1006  WBC 14.3*  --  11.2*  --  10.8*  --   HGB  7.1*   < > 8.5* 8.1* 7.7*  7.7* 7.4*  HCT 22.5*   < > 27.1* 25.4* 24.9*  24.9* 23.7*  PLT 159  --  189  --  202  --    < > = values in this interval not displayed.   BMET Recent Labs    01/28/21 0422 01/29/21 0402 01/30/21 0247  NA 144 142 141  K 4.5 4.8 4.9  CL 116* 115* 113*  CO2 23 20* 21*  GLUCOSE 177* 180* 164*  BUN 95* 86* 98*  CREATININE 1.58* 1.69* 1.80*  CALCIUM 8.5* 8.4* 8.3*   LFT Recent Labs    01/28/21 0422  PROT 5.0*  ALBUMIN 2.3*  AST 41  ALT 49*  ALKPHOS 46  BILITOT 0.8   PT/INR No results for input(s): LABPROT, INR in the last 72 hours. Hepatitis Panel No results for input(s): HEPBSAG, HCVAB, HEPAIGM, HEPBIGM in the last 72 hours.  CT ABDOMEN PELVIS WO CONTRAST  Result Date: 01/30/2021 CLINICAL DATA:  Ileus. EXAM: CT ABDOMEN AND PELVIS WITHOUT CONTRAST TECHNIQUE: Multidetector CT imaging of the abdomen and pelvis was performed following the standard protocol without IV contrast. COMPARISON:  01/26/2021 FINDINGS: Lower chest: Bibasilar atelectasis, left greater than right. Stable elevation of the left hemidiaphragm. Extensive coronary artery calcification. Hypoattenuation of the cardiac blood pool is in keeping with at least mild anemia. Mild global cardiomegaly again noted. Hepatobiliary: No focal liver abnormality is seen. No gallstones, gallbladder wall thickening, or biliary dilatation. Pancreas: Unremarkable Spleen: Unremarkable Adrenals/Urinary Tract: The adrenal glands are unremarkable. The kidneys are normal in size  and position. Multiple simple cortical cysts are seen bilaterally measuring up to 4.2 cm in diameter on the right and 4.5 cm in diameter on the left. There is no hydronephrosis. No intrarenal or ureteral calcifications are seen. The bladder is unremarkable. Stomach/Bowel: The stomach is distended. The small bowel is diffusely mildly dilated and gas and fluid-filled. The ascending colon and transverse colon are, additionally, mildly distended  with gas. The findings, together, are in keeping with an underlying ileus. Moderate to severe sigmoid diverticulosis. The appendix is unremarkable. No free intraperitoneal gas. Trace ascites is present. Vascular/Lymphatic: Mild aortoiliac atherosclerotic calcification. No aortic aneurysm. No pathologic adenopathy within the abdomen and pelvis. Reproductive: Moderate prostatic enlargement. Seminal vesicles are unremarkable. Other: There is moderate diffuse subcutaneous body wall edema. No abdominal wall hernia. The rectum contains oral contrast from prior CT examination of 01/26/2021. Musculoskeletal: Osseous structures are age-appropriate. No acute bone abnormality. No lytic or blastic bone lesion. IMPRESSION: Stable elevation of the left hemidiaphragm. Stable cardiomegaly.  Findings in keeping with at least mild anemia. Interval removal of the nasogastric tube with distension of the stomach. Diffusely distended small and large bowel in keeping with underlying ileus, similar to that noted on prior examination. Diffuse body wall subcutaneous edema and mild ascites in keeping with anasarca. Aortic Atherosclerosis (ICD10-I70.0). Electronically Signed   By: Fidela Salisbury MD   On: 01/30/2021 01:58   DG Abd Portable 1V  Result Date: 01/29/2021 CLINICAL DATA:  Abdominal distention. EXAM: PORTABLE ABDOMEN - 1 VIEW COMPARISON:  01/27/2021. FINDINGS: Interim removal of NG tube. Persistent dilated loops of small bowel noted. The colon is slightly distended. Findings again suggest adynamic ileus. Partial small bowel obstruction again cannot be excluded. Continued follow-up exams suggested to demonstrate resolution of bowel distention. No free air. Bibasilar atelectasis. Degenerative changes lumbar spine and both hips. Pelvic calcifications consistent phleboliths. IMPRESSION: 1.  Interim removal of NG tube. 2. Persistent dilated loops of small bowel noted. The colon is slightly distended. Findings again suggest adynamic  ileus. Partial small bowel obstruction again cannot be excluded. Continued follow-up exam suggested to demonstrate resolution of bowel distention. Electronically Signed   By: Marcello Moores  Register   On: 01/29/2021 06:40    Assessment / Plan:  14.75 year old male s/p left TKA 01/16/2021 who developed a post opIleus, improving. TodayK+ 4.9. Magnesium 2.2. NGT was removed 5/23. Tolerating few sips of clear liquids. No N/V. He is having some burping. He developed left mid abdominal pain 8/10 yesterday evening which resolved after he received Dilaudid. CTAP WO contrast done this am showed stomach distension, diffusely distended small and large bowel consistent with persistent ileus (similar to that noted on prior examination). He passed a loose black stool this am.  -OOB to chair as tolerated -Turn Q 2 hours -Continue TPN -NPO -NGT to be placed by RN, check KUB post placement then NGT to LIS -Dulcolax suppository Q HS -Simethicone as needed -Repeat K+, Magnesium level in am.  -Further recommendations per Dr. Ardis Hughs   2. Upper GI bleed, coffee ground output per NGT on 5/20.UGIB likely due to NGT trauma.Heparin on hold. Hg dropped to 6.8 ->received 2 units of PRBCs on 5/21. Hg 7.3 -.>7.1. Transfused one unit of PRBCs -> Hg 8.1 ->  01/29/2021 Hg -> 8.0 -> 8.5 -> 8.1 Today Hg 7.7 ->. He passed 2 loose black stools on 5/24 and one loose black stool at 6am.  -Continue to monitor H/H closley -Transfuse for Hg < 7.  -Continue PPI IV BID -  Further recommendations regarding consideration for endoscopic evaluation per Dr. Ardis Hughs  ADDENDUM: EGD with Dr. Ardis Hughs scheduled tomorrow. EGD benefits and risks discussed including risk with sedation, risk of bleeding, perforation and infection. I discussed plans for EGD with Mr. Delange and his wife. Our service will check on him in the morning to ensure he is medically stable to proceed with an EGD.   3. Anemia.  -See plan in # 2  4. Afib with RVRon Amiodarone  infusion. Heparin on hold. Cardiology plans on starting Eliquis when Hg stabilizes and when cleared by our GI service   5. Nonobstructive CAD  6. Respiratory failure, pneumonia, on oxygen 2L [place].    LOS: 13 days     LOS: 14 days   Colleen M Kennedy-Smith  01/30/2021, 10:44 AM   ________________________________________________________________________  Velora Heckler GI MD note:  I personally examined the patient, reviewed the data and agree with the assessment and plan described above.  Very slow to resolve ileus.  We are reintroducing an NG tube today and since he is having some quite dark stools and his hemoglobin is trending downward I am proceeding with EGD tomorrow.   Owens Loffler, MD [facility] Pager [phone]

## 2021-01-30 NOTE — Progress Notes (Signed)
Progress Note  Patient Name: [name] Date of Encounter: 01/30/2021  [facility] Cardiologist: Ida Rogue, MD   Subjective   Denies chest pain or dyspnea.    Inpatient Medications    Scheduled Meds: . (feeding supplement) PROSource Plus  30 mL Oral BID BM  . allopurinol  100 mg Oral QHS  . bisacodyl  10 mg Rectal Daily  . Chlorhexidine Gluconate Cloth  6 each Topical Daily  . docusate sodium  100 mg Oral Daily  . feeding supplement  1 Container Oral BID BM  . insulin aspart  0-15 Units Subcutaneous Q4H  . insulin glargine  8 Units Subcutaneous BID  . lip balm  1 application Topical BID  . mouth rinse  15 mL Mouth Rinse BID  . metoprolol succinate  12.5 mg Oral Daily  . pantoprazole  40 mg Intravenous Q12H  . sodium chloride flush  10-40 mL Intracatheter Q12H   Continuous Infusions: . sodium chloride 0 mL (01/29/21 0305)  . amiodarone 30 mg/hr (01/30/21 0053)  . TPN ADULT (ION) 80 mL/hr at 01/30/21 0231   PRN Meds: acetaminophen, alum & mag hydroxide-simeth, diphenhydrAMINE, HYDROmorphone (DILAUDID) injection, magic mouthwash, menthol-cetylpyridinium, metoprolol tartrate, naphazoline-glycerin, phenol, sodium chloride, sodium chloride flush, trimethobenzamide   Vital Signs    Vitals:   01/30/21 0630 01/30/21 0800 01/30/21 1000 01/30/21 1027  BP:  (!) 140/50 (!) 118/37   Pulse:  87 77   Resp:  (!) 31 (!) 25   Temp: (!) 100.4 F (38 C) 98.9 F (37.2 C)    TempSrc: Axillary Axillary    SpO2:  98% 98%   Weight:    112.2 kg  Height:        Intake/Output Summary (Last 24 hours) at 01/30/2021 1057 Last data filed at 01/30/2021 0630 Gross per 24 hour  Intake 1521.62 ml  Output 1150 ml  Net 371.62 ml   Last 3 Weights 01/30/2021 01/29/2021 01/23/2021  Weight (lbs) 247 lb 5.7 oz 247 lb 5.7 oz 240 lb 8.4 oz  Weight (kg) 112.2 kg 112.2 kg 109.1 kg      Telemetry    Sinus rhythm with rates 70-90s- Personally Reviewed  ECG    Sinus rhythm, rate 92 bpm,  RBBB, PACs, no STE/D, no significant change from previous  - Personally Reviewed  Physical Exam   GEN: Ill appearing gentleman sitting upright in bed    Neck: No JVD Cardiac: RRR, no murmurs, rubs, or gallops.  Respiratory: mild crackles at lung bases GI: nontender, mildly distended  MS: 2+ LE edema L>R s/p L TKR with incision site dressing in place C/D/I Neuro:  Nonfocal  Psych: Normal affect   Labs    High Sensitivity Troponin:   Recent Labs  Lab 01/20/21 1220 01/20/21 2131  TROPONINIHS 21* 23*      Chemistry Recent Labs  Lab 01/24/21 0338 01/25/21 0525 01/28/21 0422 01/29/21 0402 01/30/21 0247  NA 138   < > 144 142 141  K 3.0*   < > 4.5 4.8 4.9  CL 102   < > 116* 115* 113*  CO2 29   < > 23 20* 21*  GLUCOSE 160*   < > 177* 180* 164*  BUN 62*   < > 95* 86* 98*  CREATININE 1.63*   < > 1.58* 1.69* 1.80*  CALCIUM 8.6*   < > 8.5* 8.4* 8.3*  PROT 5.6*  --  5.0*  --   --   ALBUMIN 2.7*  --  2.3*  --   --  AST 17  --  41  --   --   ALT 18  --  49*  --   --   ALKPHOS 42  --  46  --   --   BILITOT 1.1  --  0.8  --   --   GFRNONAA 44*   < > 45* 42* 39*  ANIONGAP 7   < > 5 7 7    < > = values in this interval not displayed.     Hematology Recent Labs  Lab 01/28/21 0422 01/28/21 1516 01/29/21 0914 01/29/21 1800 01/30/21 0247 01/30/21 1006  WBC 14.3*  --  11.2*  --  10.8*  --   RBC 2.33*  --  2.85*  --  2.58*  --   HGB 7.1*   < > 8.5* 8.1* 7.7*  7.7* 7.4*  HCT 22.5*   < > 27.1* 25.4* 24.9*  24.9* 23.7*  MCV 96.6  --  95.1  --  96.5  --   MCH 30.5  --  29.8  --  29.8  --   MCHC 31.6  --  31.4  --  30.9  --   RDW 19.6*  --  18.8*  --  18.7*  --   PLT 159  --  189  --  202  --    < > = values in this interval not displayed.    BNP Recent Labs  Lab 01/29/21 0402  BNP 69.1     DDimer No results for input(s): DDIMER in the last 168 hours.   Radiology    CT ABDOMEN PELVIS WO CONTRAST  Result Date: 01/30/2021 CLINICAL DATA:  Ileus. EXAM: CT ABDOMEN  AND PELVIS WITHOUT CONTRAST TECHNIQUE: Multidetector CT imaging of the abdomen and pelvis was performed following the standard protocol without IV contrast. COMPARISON:  01/26/2021 FINDINGS: Lower chest: Bibasilar atelectasis, left greater than right. Stable elevation of the left hemidiaphragm. Extensive coronary artery calcification. Hypoattenuation of the cardiac blood pool is in keeping with at least mild anemia. Mild global cardiomegaly again noted. Hepatobiliary: No focal liver abnormality is seen. No gallstones, gallbladder wall thickening, or biliary dilatation. Pancreas: Unremarkable Spleen: Unremarkable Adrenals/Urinary Tract: The adrenal glands are unremarkable. The kidneys are normal in size and position. Multiple simple cortical cysts are seen bilaterally measuring up to 4.2 cm in diameter on the right and 4.5 cm in diameter on the left. There is no hydronephrosis. No intrarenal or ureteral calcifications are seen. The bladder is unremarkable. Stomach/Bowel: The stomach is distended. The small bowel is diffusely mildly dilated and gas and fluid-filled. The ascending colon and transverse colon are, additionally, mildly distended with gas. The findings, together, are in keeping with an underlying ileus. Moderate to severe sigmoid diverticulosis. The appendix is unremarkable. No free intraperitoneal gas. Trace ascites is present. Vascular/Lymphatic: Mild aortoiliac atherosclerotic calcification. No aortic aneurysm. No pathologic adenopathy within the abdomen and pelvis. Reproductive: Moderate prostatic enlargement. Seminal vesicles are unremarkable. Other: There is moderate diffuse subcutaneous body wall edema. No abdominal wall hernia. The rectum contains oral contrast from prior CT examination of 01/26/2021. Musculoskeletal: Osseous structures are age-appropriate. No acute bone abnormality. No lytic or blastic bone lesion. IMPRESSION: Stable elevation of the left hemidiaphragm. Stable cardiomegaly.   Findings in keeping with at least mild anemia. Interval removal of the nasogastric tube with distension of the stomach. Diffusely distended small and large bowel in keeping with underlying ileus, similar to that noted on prior examination. Diffuse body wall subcutaneous edema and mild ascites  in keeping with anasarca. Aortic Atherosclerosis (ICD10-I70.0). Electronically Signed   By: Fidela Salisbury MD   On: 01/30/2021 01:58   DG Abd Portable 1V  Result Date: 01/29/2021 CLINICAL DATA:  Abdominal distention. EXAM: PORTABLE ABDOMEN - 1 VIEW COMPARISON:  01/27/2021. FINDINGS: Interim removal of NG tube. Persistent dilated loops of small bowel noted. The colon is slightly distended. Findings again suggest adynamic ileus. Partial small bowel obstruction again cannot be excluded. Continued follow-up exams suggested to demonstrate resolution of bowel distention. No free air. Bibasilar atelectasis. Degenerative changes lumbar spine and both hips. Pelvic calcifications consistent phleboliths. IMPRESSION: 1.  Interim removal of NG tube. 2. Persistent dilated loops of small bowel noted. The colon is slightly distended. Findings again suggest adynamic ileus. Partial small bowel obstruction again cannot be excluded. Continued follow-up exam suggested to demonstrate resolution of bowel distention. Electronically Signed   By: Marcello Moores  Register   On: 01/29/2021 06:40    Cardiac Studies   TTE 01/21/2021 1. Poor acoustic windows limit study even with Definity. Overall LVEF  appears normal with no signficant wall motion abnormalities . Left  ventricular ejection fraction, by estimation, is 50 to 55%. The left  ventricle has low normal function. There is  mild left ventricular hypertrophy.  2. Right ventricular systolic function is moderately reduced. The right  ventricular size is mildly enlarged. There is moderately elevated  pulmonary artery systolic pressure.  3. Trivial mitral valve regurgitation.  4. The aortic  valve was not well visualized. Aortic valve regurgitation  is not visualized. Mild to moderate aortic valve sclerosis/calcification  is present, without any evidence of aortic stenosis.  5. The inferior vena cava is dilated in size with <50% respiratory  variability, suggesting right atrial pressure of 15 mmHg.   R/LHC 2018:  Dist LAD lesion, 20 %stenosed.  The left ventricular ejection fraction is 55-65% by visual estimate. Assessment: 1. Minimal coronary plaque 2. Normal LV function EF 55-60% 3. Very mildly elevated pulmonary pressures (much lower than echo) 4. 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 Will get CT chest to further evaluate anomalous pulmonary vein. Given Qp/Qs < 1.5 and lack of RV strain will likely continue to tolerate well.   Echocardiogram 2018: Study Conclusions   - Left ventricle: The cavity size was normal. Wall thickness was  normal. Systolic function was normal. The estimated ejection  fraction was in the range of 50% to 55%. Wall motion was normal;  there were no regional wall motion abnormalities. Doppler  parameters are consistent with abnormal left ventricular  relaxation (grade 1 diastolic dysfunction).  - Ventricular septum: The contour showed systolic flattening. These  changes are consistent with RV pressure overload.  - Left atrium: The atrium was mildly dilated.  - Right ventricle: The cavity size was moderately dilated. Wall  thickness was mildly increased. Systolic function was mildly  reduced.  - Pulmonary arteries: Systolic pressure was moderately to severely  increased. PA peak pressure: 60 mm Hg (S).  - Inferior vena cava: The vessel was dilated. The respirophasic  diameter changes were blunted (< 50%), consistent with elevated  central venous pressure.   Patient Profile     Filbert Craze is a 75 y.o. 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  Course has been complicated on 3/82/5053 by  dehydration and hypotension.  He subsequently developed an ileus and SBO.  He developed respiratory failure on 01/20/2021 due to aspiration.  VQ scan negative for PE.  He developed A. fib with RVR at that time.  Cardiology was consulted on 01/26/2021 for A. fib management in the setting of GI bleed and acute blood loss anemia.  Assessment & Plan    1. Paroxysmal atrial fibrillation: patient found to have atrial fibrillation with RVR this admission in the setting of GIB, acute blood loss anemia, SBO, and aspiration PNA. Echo showed EF 50-55%, no RWMA, and no significant valvular abnormalities. He was NPO and started on amiodarone gtt with conversion to sinus rhythm. Now tolerating CLD. Anticoagulation deferred in the setting of GIB despite CHA2DS2-VASc Score = 5 [CHF History: No, HTN History: Yes, Diabetes History: Yes, Stroke History: No, Vascular Disease History: Yes, Age Score: 2, Gender Score: 0], giving patient an annual risk of stroke 7.2 %. Hgb trickling back down to 7.1 today. He is maintaining sinus rhythm at this time - Continue amiodarone, he is tolerating PO, will switch from gtt to PO amio 200 mg BID - Recommend starting eliquis 5mg  BID once Hgb stabilizes and he is cleared to start anticoagulation by GI  2. HTN: BP intermittently elevated. Home metoprolol succinate AND tartrate, lisinopril, spironolactone, HCTZ, lasix, and doxazosin on hold.   3. 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 RV was not dilated at that time, though now is dilated with dilated IVC on echo this admission. Felt this would not be an issue for him on Dr. Kathalene Frames assessment.  - Consider repeat surveillance echo outpatient to  re-evaluate RV function in the future  4. Non-obstructive CAD: minimal LAD stenosis noted on LHC in 2018. No anginal complaints. HsTrop elevated to 23 this admission in the setting of Afib with RVR, GIB, SOB, and aspiration PNA. Not c/w ACS - Resume statin when tolerating po  5. Prolonged QTc: 534 on EKG this morning. Reglan and zofran discontinued.  - Continue to limit QT prolonging medications  6. Volume overload: suspect multifactorial in the setting of poor nutritional status (albumin 2.3) and volume resuscitation efforts this admission. He has 2-3+ LE edema L>R s/p L TKR. He has received 3 u PRBCs - Continue to monitor volume status closely.  - Appears volume overloaded on exam and worsening renal function may be cardiorenal.  Recommend checking CVP, if elevated would start diuresis  Remainder of care per primary team: - GIB with ongoing anemia requiring transfusion - SBO - Aspiration PNA - S/p TKR      For questions or updates, please contact [facility] Please consult [email or web address] for contact info under        Signed, Donato Heinz, MD  01/30/2021, 10:57 AM

## 2021-01-30 NOTE — Progress Notes (Addendum)
PROGRESS NOTE  [name] TOI:[id number] DOB: 03/22/1946 DOA: 01/16/2021 PCP: Bing Neighbors, NP  HPI/Recap of past 87 hours: 75 year old man with hx of SBO, hypertension, hyperlipidemia, GERD, type 2 diabetes, presenting for TKR complicated by ileus, LLL PNA, new onset perioperative A. fib with RVR, noted on 01/20/2021 and started on amiodarone drip and heparin drip in the ICU.  He had acute hypoxic respiratory failure and was transferred to the ICU on 01/20/2021 and was seen by PCCM.  TRH, hospitalist team was initially consulted for hypotension requiring vasopressors, acute kidney injury with a creatinine of 4 and constipation with work-up of ileus versus SBO.  NG tube placed on 01/20/2021.  NG tube removed on 01/28/2021.  On 01/25/2021 NGT output was noted to be coffee-ground for which GI was consulted.  Was also noted to have acute blood loss anemia.  On 01/26/2021 he was noted to have symptomatic anemia 2 unit PRBC were ordered to be transfused.  Heparin drip stopped.  Cardiology consulted.  Hemoglobin dropped on 01/28/2021 post 1 unit PRBC transfusion.  NG tube removed on 01/28/2021 and replaced on 01/30/2021 due to evidence of persistent ileus.  01/30/21: Patient was seen and examined with his wife at his bedside.  He is hypersomnolent after received a dose of IV Dilaudid this morning.  Hemoglobin dropped to 7.4 this morning.  Seen by GI with plan for EGD on 01/31/2021 if the patient is stable.   Assessment/Plan: Principal Problem:   Ileus ([place]) Active Problems:   Type 2 diabetes mellitus without complication, without long-term current use of insulin (HCC)   Essential hypertension   Obesity (BMI 30-39.9)   CAD (coronary artery disease)   Pulmonary HTN (HCC)   S/P TKR (total knee replacement) using cement   Acute respiratory distress   AKI (acute kidney injury) ([place])   Acute renal failure (HCC)   Benign prostatic hyperplasia   Obstructive sleep apnea treated with continuous positive  airway pressure (CPAP)   Hypotension   GERD (gastroesophageal reflux disease)   Protein-calorie malnutrition, moderate (HCC)   Gastrointestinal bleed   PAF (paroxysmal atrial fibrillation) (HCC)   Acute blood loss anemia   History of gastritis   Irritable bowel syndrome with intermittent diarrhea   SOB (shortness of breath)   Anasarca  Status post total left knee replacement 01/16/21 by Dr. [name] POD #14 Management per primary, orthopedic surgery. Out of bed to chair with every shift. Mobilize as tolerated Continue PT OT with assistance and fall precautions.  Persistent ileus, partial small bowel obstruction History of small bowel obstruction treated medically at outside facility in [place] in 2019. NG tube was removed on 01/28/2021, NG tube was reinserted on 01/30/2021 with evidence of persistent ileus. Continue to replete and optimize magnesium and potassium levels. Mobilize as tolerated. Appreciate general surgery and GI consultative services.  Acute blood loss symptomatic anemia secondary to suspected upper GI bleed. History of gastritis. Gastric content sent on 01/25/2021 positive for occult blood. Drop of hemoglobin 6.8 on 01/26/2021, transfused 2 unit PRBCs. Repeat hemoglobin 7.9 on 01/27/2021. Drop of hemoglobin 7.1 on 01/28/2021.  Transfused 1 unit PRBCs.   Hemoglobin 7.4 on 01/30/2021. Received Protonix bolus on 01/26/2021.  Protonix drip stopped on 01/30/2021, IV Protonix 40 mg twice daily started. Hemoglobin 15.1 on 01/07/2021. Will transfuse 1 unit PRBCs to keep hemoglobin greater than 8.0 and recheck CBC.  Acute hypoxic hypercarbic respiratory failure secondary to recent left lower lobe pneumonia Not oxygen supplementation at baseline  Hyperammonemia,  suspect upper GI bleed Ammonia level 71 Give 1 dose of lactulose enema  Mixed metabolic and respiratory acidosis Serum bicarb 21, pH 7.218, PCO2 48 on venous blood gas Give 2 A of bicarb Bicarb also in TPN NG  tube placed which makes it difficult to use CPAP  Chronic diastolic CHF Last 2D echo done on 01/21/2021 showed LVEF 50 to 55% Continue strict I's and O's and daily weight BNP 69 likely not accurate in the setting of obesity Net I&O +11.7 L  QTC prolongation. Twelve-lead EKG 01/28/2021 QTC 534. Minimize QTC prolonging agents as possible. DC'd Reglan. DC'd Zofran and replace with Tigan for nausea On Protonix drip, defer to GI.  New onset perioperative A. fib with RVR, converted to sinus rhythm CHA2DS2-VASc of 4 Was noted on 01/20/2021: Patient was transferred to the ICU Was started on amiodarone drip and heparin drip on 01/20/2021 in the ICU. Heparin drip has been DC'd on 01/26/2021 due to upper GI bleed. Amiodarone drip was DC'd on 01/30/2021 and switched to oral however NG tube has been replaced to suction. 2D echo was done on 01/21/2021 left atrium normal in size.  EF 50-55%. Twelve-lead EKG on 01/28/2021, converted to sinus rhythm.  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 Creatinine uptrending this morning 1.8 from 1.69 from 1.58. Continue to avoid nephrotoxic agents, dehydration and hypotension. Repeat chemistry panel in the morning  OSA on CPAP Continue CPAP nightly. Avoid BiPAP/CPAP with NG tube in place.  Essential hypertension DC Solu-Cortef, stress dose steroids on 01/25/2021 IV Lopressor as needed with parameters. BP is currently stable. Avoid hypotension due to AKI. Continue to monitor vital signs  Resolved post repletion: Hypokalemia Potassium 3.4>> 3.9> 4.5 Repleted with TPN  Prediabetes with hyperglycemia likely contributed by IV steroid which was discontinued on 01/25/2021. Hemoglobin A1c 5.7 on 01/16/2021 Insulin sliding scale if needed Avoid hypoglycemia  Electrolyte derangement on TPN Hypokalemia, repleted with TPN Hyperglycemia, dose adjustment by pharmacy Hypophosphatemia, repleted with TPN Appreciate pharmacy's  assistance  Elevated troponin Peaked at 23 Heparin drip DC'd on 01/26/2021 due to upper GI bleed. Denies any anginal symptoms at the time of this exam. Cardiology following  GERD Protonix drip.  Received Protonix bolus. Defer to GI.  Gout Resume home regimen when can take adequate oral   Code Status: Full code.  Family Communication: Updated his wife at bedside.  Disposition Plan: Defer to primary team.   Thank you for allowing [place] to participate in the care of this patient.  We will continue to follow along with you.     Objective: Vitals:   01/30/21 0800 01/30/21 1000 01/30/21 1027 01/30/21 1200  BP: (!) 140/50 (!) 118/37  (!) 130/32  Pulse: 87 77  72  Resp: (!) 31 (!) 25  (!) 28  Temp: 98.9 F (37.2 C)   97.7 F (36.5 C)  TempSrc: Axillary   Axillary  SpO2: 98% 98%  98%  Weight:   112.2 kg   Height:        Intake/Output Summary (Last 24 hours) at 01/30/2021 1349 Last data filed at 01/30/2021 1137 Gross per 24 hour  Intake 2958.84 ml  Output 1150 ml  Net 1808.84 ml   Filed Weights   01/23/21 1122 01/29/21 1400 01/30/21 1027  Weight: 109.1 kg 112.2 kg 112.2 kg    Exam:  . General: 75 y.o. year-old male weak appearing in no acute distress.  He is somnolent but arousable to voices.   . Cardiovascular: Regular  rate and rhythm no rubs or gallop.   Marland Kitchen Respiratory: Mild rales at bases no wheezing noted.  Poor inspiratory effort.   . Abdomen: Obese distended hypoactive bowel sounds present.  . Musculoskeletal: Surgical dressing to right knee.  Edema in lower extremities bilaterally.   . Skin: No ulcerative lesions noted. Marland Kitchen Psychiatry: Mood is appropriate for condition and setting.   Data Reviewed: CBC: Recent Labs  Lab 01/24/21 0338 01/25/21 0525 01/26/21 0427 01/26/21 1208 01/27/21 0541 01/27/21 1154 01/28/21 0422 01/28/21 1516 01/29/21 0402 01/29/21 0914 01/29/21 1800 01/30/21 0247 01/30/21 1006  WBC 13.7*   < > 17.3*  --  17.4*  --  14.3*  --    --  11.2*  --  10.8*  --   NEUTROABS 9.2*  --   --   --   --   --  11.6*  --   --   --   --   --   --   HGB 10.5*   < > 7.5*   < > 7.9*   < > 7.1*   < > 8.0* 8.5* 8.1* 7.7*  7.7* 7.4*  HCT 31.5*   < > 23.5*   < > 25.4*   < > 22.5*   < > 25.6* 27.1* 25.4* 24.9*  24.9* 23.7*  MCV 96.0   < > 98.3  --  95.8  --  96.6  --   --  95.1  --  96.5  --   PLT 176   < > 204  --  178  --  159  --   --  189  --  202  --    < > = values in this interval not displayed.   Basic Metabolic Panel: Recent Labs  Lab 01/26/21 0427 01/27/21 0509 01/28/21 0422 01/29/21 0402 01/30/21 0247  NA 143 144 144 142 141  K 3.4* 3.9 4.5 4.8 4.9  CL 106 111 116* 115* 113*  CO2 26 22 23  20* 21*  GLUCOSE 340* 333* 177* 180* 164*  BUN 72* 83* 95* 86* 98*  CREATININE 1.30* 1.29* 1.58* 1.69* 1.80*  CALCIUM 8.5* 8.3* 8.5* 8.4* 8.3*  MG 2.0 2.1 2.2 2.2 2.2  PHOS 2.4* 3.4 3.7 4.8* 4.2   GFR: Estimated Creatinine Clearance: 43.1 mL/min (A) (by C-G formula based on SCr of 1.8 mg/dL (H)). Liver Function Tests: Recent Labs  Lab 01/24/21 0338 01/28/21 0422  AST 17 41  ALT 18 49*  ALKPHOS 42 46  BILITOT 1.1 0.8  PROT 5.6* 5.0*  ALBUMIN 2.7* 2.3*   No results for input(s): LIPASE, AMYLASE in the last 168 hours. Recent Labs  Lab 01/30/21 1106  AMMONIA 71*   Coagulation Profile: No results for input(s): INR, PROTIME in the last 168 hours. Cardiac Enzymes: No results for input(s): CKTOTAL, CKMB, CKMBINDEX, TROPONINI in the last 168 hours. BNP (last 3 results) No results for input(s): PROBNP in the last 8760 hours. HbA1C: No results for input(s): HGBA1C in the last 72 hours. CBG: Recent Labs  Lab 01/29/21 1533 01/29/21 1931 01/30/21 0348 01/30/21 0811 01/30/21 1112  GLUCAP 149* 157* 159* 147* 170*   Lipid Profile: Recent Labs    01/28/21 0422  TRIG 123   Thyroid Function Tests: No results for input(s): TSH, T4TOTAL, FREET4, T3FREE, THYROIDAB in the last 72 hours. Anemia Panel: No results for  input(s): VITAMINB12, FOLATE, FERRITIN, TIBC, IRON, RETICCTPCT in the last 72 hours. Urine analysis:    Component Value Date/Time   COLORURINE AMBER (  A) 01/19/2021 1622   APPEARANCEUR CLOUDY (A) 01/19/2021 1622   LABSPEC 1.018 01/19/2021 1622   PHURINE 5.0 01/19/2021 1622   GLUCOSEU NEGATIVE 01/19/2021 1622   HGBUR MODERATE (A) 01/19/2021 1622   BILIRUBINUR NEGATIVE 01/19/2021 1622   KETONESUR NEGATIVE 01/19/2021 1622   PROTEINUR 100 (A) 01/19/2021 1622   NITRITE NEGATIVE 01/19/2021 1622   LEUKOCYTESUR NEGATIVE 01/19/2021 1622   Sepsis Labs: @LABRCNTIP (procalcitonin:4,lacticidven:4)  )No results found for this or any previous visit (from the past 240 hour(s)).    Studies: CT ABDOMEN PELVIS WO CONTRAST  Result Date: 01/30/2021 CLINICAL DATA:  Ileus. EXAM: CT ABDOMEN AND PELVIS WITHOUT CONTRAST TECHNIQUE: Multidetector CT imaging of the abdomen and pelvis was performed following the standard protocol without IV contrast. COMPARISON:  01/26/2021 FINDINGS: Lower chest: Bibasilar atelectasis, left greater than right. Stable elevation of the left hemidiaphragm. Extensive coronary artery calcification. Hypoattenuation of the cardiac blood pool is in keeping with at least mild anemia. Mild global cardiomegaly again noted. Hepatobiliary: No focal liver abnormality is seen. No gallstones, gallbladder wall thickening, or biliary dilatation. Pancreas: Unremarkable Spleen: Unremarkable Adrenals/Urinary Tract: The adrenal glands are unremarkable. The kidneys are normal in size and position. Multiple simple cortical cysts are seen bilaterally measuring up to 4.2 cm in diameter on the right and 4.5 cm in diameter on the left. There is no hydronephrosis. No intrarenal or ureteral calcifications are seen. The bladder is unremarkable. Stomach/Bowel: The stomach is distended. The small bowel is diffusely mildly dilated and gas and fluid-filled. The ascending colon and transverse colon are, additionally, mildly  distended with gas. The findings, together, are in keeping with an underlying ileus. Moderate to severe sigmoid diverticulosis. The appendix is unremarkable. No free intraperitoneal gas. Trace ascites is present. Vascular/Lymphatic: Mild aortoiliac atherosclerotic calcification. No aortic aneurysm. No pathologic adenopathy within the abdomen and pelvis. Reproductive: Moderate prostatic enlargement. Seminal vesicles are unremarkable. Other: There is moderate diffuse subcutaneous body wall edema. No abdominal wall hernia. The rectum contains oral contrast from prior CT examination of 01/26/2021. Musculoskeletal: Osseous structures are age-appropriate. No acute bone abnormality. No lytic or blastic bone lesion. IMPRESSION: Stable elevation of the left hemidiaphragm. Stable cardiomegaly.  Findings in keeping with at least mild anemia. Interval removal of the nasogastric tube with distension of the stomach. Diffusely distended small and large bowel in keeping with underlying ileus, similar to that noted on prior examination. Diffuse body wall subcutaneous edema and mild ascites in keeping with anasarca. Aortic Atherosclerosis (ICD10-I70.0). Electronically Signed   By: Fidela Salisbury MD   On: 01/30/2021 01:58   DG Abd 1 View  Result Date: 01/30/2021 CLINICAL DATA:  NG tube placement. EXAM: ABDOMEN - 1 VIEW COMPARISON:  None. FINDINGS: NG tube is in place with the tip in the fundus of the stomach. IMPRESSION: NG tube projects in good position. Electronically Signed   By: Inge Rise M.D.   On: 01/30/2021 13:35    Scheduled Meds: . (feeding supplement) PROSource Plus  30 mL Oral BID BM  . allopurinol  100 mg Oral QHS  . amiodarone  200 mg Oral BID  . bisacodyl  10 mg Rectal Daily  . Chlorhexidine Gluconate Cloth  6 each Topical Daily  . docusate sodium  100 mg Oral Daily  . feeding supplement  1 Container Oral BID BM  . furosemide  40 mg Intravenous BID  . insulin aspart  0-15 Units Subcutaneous Q4H  .  lip balm  1 application Topical BID  . mouth rinse  15  mL Mouth Rinse BID  . metoprolol succinate  12.5 mg Oral Daily  . pantoprazole  40 mg Intravenous Q12H  . sodium chloride flush  10-40 mL Intracatheter Q12H    Continuous Infusions: . sodium chloride 0 mL (01/29/21 0305)  . TPN ADULT (ION) 80 mL/hr at 01/30/21 1137  . TPN ADULT (ION)       LOS: 14 days     Kayleen Memos, MD [facility] Pager [phone]  If 7PM-7AM, please contact night-coverage [email or web address] Password [email or web address] 01/30/2021, 1:49 PM

## 2021-01-31 ENCOUNTER — Inpatient Hospital Stay (HOSPITAL_COMMUNITY): Payer: Medicare Other | Admitting: Anesthesiology

## 2021-01-31 ENCOUNTER — Encounter (HOSPITAL_COMMUNITY)
Admission: RE | Disposition: A | Discharge status: Other Acute Inpatient Hospital | Payer: Medicare Other | Source: Home / Self Care | Attending: Specialist

## 2021-01-31 ENCOUNTER — Encounter (HOSPITAL_COMMUNITY): Payer: Self-pay | Admitting: Specialist

## 2021-01-31 DIAGNOSIS — K2970 Gastritis, unspecified, without bleeding: Secondary | ICD-10-CM | POA: Diagnosis not present

## 2021-01-31 DIAGNOSIS — R601 Generalized edema: Secondary | ICD-10-CM | POA: Diagnosis not present

## 2021-01-31 DIAGNOSIS — N179 Acute kidney failure, unspecified: Secondary | ICD-10-CM | POA: Diagnosis not present

## 2021-01-31 DIAGNOSIS — I480 Paroxysmal atrial fibrillation: Secondary | ICD-10-CM | POA: Diagnosis not present

## 2021-01-31 DIAGNOSIS — K921 Melena: Secondary | ICD-10-CM | POA: Diagnosis not present

## 2021-01-31 DIAGNOSIS — K567 Ileus, unspecified: Secondary | ICD-10-CM | POA: Diagnosis not present

## 2021-01-31 HISTORY — PX: ESOPHAGOGASTRODUODENOSCOPY (EGD) WITH PROPOFOL: SHX5813

## 2021-01-31 LAB — COMPREHENSIVE METABOLIC PANEL
ALT: 72 U/L — ABNORMAL HIGH (ref 0–44)
AST: 29 U/L (ref 15–41)
Albumin: 2 g/dL — ABNORMAL LOW (ref 3.5–5.0)
Alkaline Phosphatase: 55 U/L (ref 38–126)
Anion gap: 9 (ref 5–15)
BUN: 98 mg/dL — ABNORMAL HIGH (ref 8–23)
CO2: 22 mmol/L (ref 22–32)
Calcium: 8.3 mg/dL — ABNORMAL LOW (ref 8.9–10.3)
Chloride: 113 mmol/L — ABNORMAL HIGH (ref 98–111)
Creatinine, Ser: 1.81 mg/dL — ABNORMAL HIGH (ref 0.61–1.24)
GFR, Estimated: 39 mL/min — ABNORMAL LOW (ref >60)
Glucose, Bld: 118 mg/dL — ABNORMAL HIGH (ref 70–99)
Potassium: 3.7 mmol/L (ref 3.5–5.1)
Sodium: 144 mmol/L (ref 135–145)
Total Bilirubin: 1.2 mg/dL (ref 0.3–1.2)
Total Protein: 5 g/dL — ABNORMAL LOW (ref 6.5–8.1)

## 2021-01-31 LAB — CBC
HCT: 26.4 % — ABNORMAL LOW (ref 39.0–52.0)
Hemoglobin: 8.5 g/dL — ABNORMAL LOW (ref 13.0–17.0)
MCH: 30.4 pg (ref 26.0–34.0)
MCHC: 32.2 g/dL (ref 30.0–36.0)
MCV: 94.3 fL (ref 80.0–100.0)
Platelets: 202 10*3/uL (ref 150–400)
RBC: 2.8 MIL/uL — ABNORMAL LOW (ref 4.22–5.81)
RDW: 18 % — ABNORMAL HIGH (ref 11.5–15.5)
WBC: 10 10*3/uL (ref 4.0–10.5)
nRBC: 0 % (ref 0.0–0.2)

## 2021-01-31 LAB — TYPE AND SCREEN
ABO/RH(D): O POS
Antibody Screen: NEGATIVE
Unit division: 0

## 2021-01-31 LAB — HEMOGLOBIN AND HEMATOCRIT, BLOOD
HCT: 25.7 % — ABNORMAL LOW (ref 39.0–52.0)
Hemoglobin: 8.3 g/dL — ABNORMAL LOW (ref 13.0–17.0)

## 2021-01-31 LAB — PHOSPHORUS: Phosphorus: 3.9 mg/dL (ref 2.5–4.6)

## 2021-01-31 LAB — GLUCOSE, CAPILLARY
Glucose-Capillary: 118 mg/dL — ABNORMAL HIGH (ref 70–99)
Glucose-Capillary: 126 mg/dL — ABNORMAL HIGH (ref 70–99)
Glucose-Capillary: 162 mg/dL — ABNORMAL HIGH (ref 70–99)

## 2021-01-31 LAB — MAGNESIUM: Magnesium: 2.3 mg/dL (ref 1.7–2.4)

## 2021-01-31 LAB — AMMONIA: Ammonia: 21 umol/L (ref 9–35)

## 2021-01-31 LAB — BPAM RBC
Blood Product Expiration Date: 202206272359
ISSUE DATE / TIME: 202205251738
Unit Type and Rh: 5100

## 2021-01-31 SURGERY — ESOPHAGOGASTRODUODENOSCOPY (EGD) WITH PROPOFOL
Anesthesia: Monitor Anesthesia Care

## 2021-01-31 MED ORDER — FLUCONAZOLE 100MG IVPB
100.0000 mg | INTRAVENOUS | Status: DC
  Administered 2021-01-31 – 2021-02-05 (×6): 100 mg via INTRAVENOUS
  Filled 2021-01-31 (×7): qty 50

## 2021-01-31 MED ORDER — PROPOFOL 10 MG/ML IV BOLUS
INTRAVENOUS | Status: DC | PRN
  Administered 2021-01-31 (×4): 10 mg via INTRAVENOUS

## 2021-01-31 MED ORDER — PHENYLEPHRINE 40 MCG/ML (10ML) SYRINGE FOR IV PUSH (FOR BLOOD PRESSURE SUPPORT)
PREFILLED_SYRINGE | INTRAVENOUS | Status: DC | PRN
  Administered 2021-01-31 (×3): 120 ug via INTRAVENOUS
  Administered 2021-01-31: 80 ug via INTRAVENOUS
  Administered 2021-01-31: 200 ug via INTRAVENOUS

## 2021-01-31 MED ORDER — TRAVASOL 10 % IV SOLN
INTRAVENOUS | Status: AC
  Filled 2021-01-31: qty 1056

## 2021-01-31 MED ORDER — POTASSIUM CHLORIDE 10 MEQ/50ML IV SOLN
10.0000 meq | INTRAVENOUS | Status: AC
  Administered 2021-01-31 (×2): 10 meq via INTRAVENOUS
  Filled 2021-01-31 (×2): qty 50

## 2021-01-31 MED ORDER — LIDOCAINE 2% (20 MG/ML) 5 ML SYRINGE
INTRAMUSCULAR | Status: DC | PRN
  Administered 2021-01-31: 100 mg via INTRAVENOUS

## 2021-01-31 MED ORDER — METOCLOPRAMIDE HCL 5 MG/ML IJ SOLN
5.0000 mg | Freq: Three times a day (TID) | INTRAMUSCULAR | Status: DC
  Administered 2021-01-31 – 2021-02-06 (×18): 5 mg via INTRAVENOUS
  Filled 2021-01-31 (×18): qty 2

## 2021-01-31 MED ORDER — PROPOFOL 500 MG/50ML IV EMUL
INTRAVENOUS | Status: DC | PRN
  Administered 2021-01-31: 75 ug/kg/min via INTRAVENOUS

## 2021-01-31 SURGICAL SUPPLY — 15 items

## 2021-01-31 NOTE — Op Note (Signed)
[facility] Patient Name: [name] Procedure Date: 01/31/2021 MRN: [id number] Attending MD: Milus Banister , MD Date of Birth: 24-May-1946 CSN: [id number] Age: 75 Admit Type: Inpatient Procedure:                Upper GI endoscopy Indications:              dark stool output, decreasing Hb; has prolonged                            post Ortho surgery ileus Providers:                Milus Banister, MD, Burtis Junes, RN, Fransico Setters                            [place], Technician Referring MD:              Medicines:                Monitored Anesthesia Care Complications:            No immediate complications. Estimated blood loss:                            None. Estimated Blood Loss:     Estimated blood loss: none. Procedure:                Pre-Anesthesia Assessment:                           - Prior to the procedure, a History and Physical                            was performed, and patient medications and                            allergies were reviewed. The patient's tolerance of                            previous anesthesia was also reviewed. The risks                            and benefits of the procedure and the sedation                            options and risks were discussed with the patient.                            All questions were answered, and informed consent                            was obtained. Prior Anticoagulants: The patient has                            taken no previous anticoagulant or antiplatelet                            agents. ASA  Grade Assessment: IV - A patient with                            severe systemic disease that is a constant threat                            to life. After reviewing the risks and benefits,                            the patient was deemed in satisfactory condition to                            undergo the procedure.                           After obtaining informed consent, the endoscope was                             passed under direct vision. Throughout the                            procedure, the patient's blood pressure, pulse, and                            oxygen saturations were monitored continuously. The                            GIF-H190 ([id number]) Olympus gastroscope was                            introduced through the mouth, and advanced to the                            second part of duodenum. The upper GI endoscopy was                            accomplished without difficulty. The patient                            tolerated the procedure well. Scope In: Scope Out: 9:22:56 AM Findings:      There were several scattered white exudates throughout the esophagus,       consistent with candida infection.      NG tube in good position, was not removed during this procedure      Minimal inflammation characterized by erythema was found in the gastric       antrum. This is of doubtful clinical signficance.      The exam was otherwise without abnormality. Impression:               - Candida esophagitis.                           - Very minimal non-specific gastritis, of doubtful  clinical significance.                           - NG tube was in good position and left in place                            during/after this procedure Moderate Sedation:      Not Applicable - Patient had care per Anesthesia. Recommendation:           - Return patient to hospital ward for ongoing care.                           - He has a prolonged ileus following an orthopedic                            surgery. Of utmost importance is getting him OOB,                            mobilized, ambulating as much as possible as soon                            as possible. Keep TNA going for now. Keep NG tube                            at LIS for now as well.                           - I am going to order TID 5mg  reglan IV to see if                            that will  help his ileus.                           - I am also ordering a 10 day course of diflucan                            for his candida esophagitis. IV for now, can be                            switched to oral if he starts reliably back on PO                            intake. Procedure Code(s):        --- Professional ---                           [phone], Esophagogastroduodenoscopy, flexible,                            transoral; diagnostic, including collection of                            specimen(s) by brushing or washing, when performed                            (  separate procedure) Diagnosis Code(s):        --- Professional ---                           K29.70, Gastritis, unspecified, without bleeding                           K92.1, Melena (includes Hematochezia) CPT copyright 2019 American Medical Association. All rights reserved. The codes documented in this report are preliminary and upon coder review may  be revised to meet current compliance requirements. Milus Banister, MD 01/31/2021 9:38:22 AM This report has been signed electronically. Number of Addenda: 0

## 2021-01-31 NOTE — Transfer of Care (Signed)
Immediate Anesthesia Transfer of Care Note  Patient: [name]  Procedure(s) Performed: ESOPHAGOGASTRODUODENOSCOPY (EGD) WITH PROPOFOL (N/A )  Patient Location: Endoscopy Unit  Anesthesia Type:MAC  Level of Consciousness: drowsy and patient cooperative  Airway & Oxygen Therapy: Patient Spontanous Breathing and Patient connected to face mask oxygen  Post-op Assessment: Report given to RN and Post -op Vital signs reviewed and stable  Post vital signs: Reviewed and stable  Last Vitals:  Vitals Value Taken Time  BP 99/29 01/31/21 0938  Temp    Pulse 80 01/31/21 0940  Resp 31 01/31/21 0940  SpO2 100 % 01/31/21 0940  Vitals shown include unvalidated device data.  Last Pain:  Vitals:   01/31/21 0934  TempSrc:   PainSc: 0-No pain      Patients Stated Pain Goal: 2 (26/33/35 4562)  Complications: No complications documented.

## 2021-01-31 NOTE — Anesthesia Preprocedure Evaluation (Addendum)
Anesthesia Evaluation  Patient identified by MRN, date of birth, ID band Patient awake    Reviewed: Allergy & Precautions, NPO status , Patient's Chart, lab work & pertinent test results  Airway Mallampati: IV  TM Distance: <3 FB Neck ROM: Full    Dental no notable dental hx.    Pulmonary neg pulmonary ROS,    Pulmonary exam normal breath sounds clear to auscultation       Cardiovascular hypertension, +CHF  Normal cardiovascular exam Rhythm:Regular Rate:Normal  EF 50-55% Pulmonary HTN RV function decreased   Neuro/Psych negative neurological ROS  negative psych ROS   GI/Hepatic negative GI ROS, Neg liver ROS,   Endo/Other  diabetes  Renal/GU Renal InsufficiencyRenal disease  negative genitourinary   Musculoskeletal negative musculoskeletal ROS (+)   Abdominal   Peds negative pediatric ROS (+)  Hematology  (+) anemia , B cell lymphoma   Anesthesia Other Findings   Reproductive/Obstetrics negative OB ROS                            Anesthesia Physical Anesthesia Plan  ASA: III  Anesthesia Plan: MAC   Post-op Pain Management:    Induction: Intravenous  PONV Risk Score and Plan: 1 and Propofol infusion and Treatment may vary due to age or medical condition  Airway Management Planned: Simple Face Mask  Additional Equipment:   Intra-op Plan:   Post-operative Plan:   Informed Consent: I have reviewed the patients History and Physical, chart, labs and discussed the procedure including the risks, benefits and alternatives for the proposed anesthesia with the patient or authorized representative who has indicated his/her understanding and acceptance.     Dental advisory given  Plan Discussed with: CRNA and Surgeon  Anesthesia Plan Comments: (Glidescope if GETA)       Anesthesia Quick Evaluation

## 2021-01-31 NOTE — Anesthesia Postprocedure Evaluation (Signed)
Anesthesia Post Note  Patient: [name]  Procedure(s) Performed: ESOPHAGOGASTRODUODENOSCOPY (EGD) WITH PROPOFOL (N/A )     Patient location during evaluation: PACU Anesthesia Type: MAC Level of consciousness: awake and alert Pain management: pain level controlled Vital Signs Assessment: post-procedure vital signs reviewed and stable Respiratory status: spontaneous breathing, nonlabored ventilation, respiratory function stable and patient connected to nasal cannula oxygen Cardiovascular status: stable and blood pressure returned to baseline Postop Assessment: no apparent nausea or vomiting Anesthetic complications: no   No complications documented.  Last Vitals:  Vitals:   01/31/21 1000 01/31/21 1003  BP: (!) 125/34 (!) 131/29  Pulse: 84 81  Resp: 20 (!) 26  Temp:    SpO2: 93% 94%    Last Pain:  Vitals:   01/31/21 1003  TempSrc:   PainSc: 0-No pain                 Tata Timmins S

## 2021-01-31 NOTE — Interval H&P Note (Signed)
History and Physical Interval Note:  01/31/2021 8:48 AM  [name]  has presented today for surgery, with the diagnosis of Anemia. Heme + stool..  The various methods of treatment have been discussed with the patient and family. After consideration of risks, benefits and other options for treatment, the patient has consented to  Procedure(s): ESOPHAGOGASTRODUODENOSCOPY (EGD) WITH PROPOFOL (N/A) as a surgical intervention.  The patient's history has been reviewed, patient examined, no change in status, stable for surgery.  I have reviewed the patient's chart and labs.  Questions were answered to the patient's satisfaction.     Milus Banister

## 2021-01-31 NOTE — Progress Notes (Signed)
Progress Note  Day of Surgery  Subjective: CC: He has just arrived back to floor following his EGD. He is feeling much better from yesterday and has not had any nausea except immediately prior to EGD. His appetite is much improved - wanting food and water - compared to yesterday when was only wanting sips of clears. He thinks he is passing some flatus but is belching more. Still having BMs.   Wife and son are bedside. Son reports patient has had 2 episodes of clinically diagnosed SBO without prior history of abdominal surgery - hospitalized for this in 2018. He has had other similar symptom episodes which have improved with diet modification.  At baseline he swings from diarrhea to constipation. Normal colonscopy in 2020   Objective: Vital signs in last 24 hours: Temp:  [97.4 F (36.3 C)-99.2 F (37.3 C)] 98.3 F (36.8 C) (05/26 0934) Pulse Rate:  [72-88] 81 (05/26 1003) Resp:  [15-34] 26 (05/26 1003) BP: (79-183)/(18-77) 131/29 (05/26 1003) SpO2:  [93 %-100 %] 94 % (05/26 1003) Weight:  [108.5 kg] 108.5 kg (05/26 0500) Last BM Date: 01/30/21  Intake/Output from previous day: 05/25 0701 - 05/26 0700 In: 3319 [P.O.:360; I.V.:2515.9; Blood:443.2] Out: 3050 [Urine:2650; Emesis/NG output:400] Intake/Output this shift: Total I/O In: 200 [I.V.:200] Out: 650 [Urine:650]  PE: General: pleasant, ill appearing male who is sitting up in bed in NAD HEENT: head is normocephalic, atraumatic. Heart: regular, rate, and rhythm. Palpable radial pulses bilaterally Lungs: CTAB. Respiratory effort nonlabored. Abd: very distended. Mildly tender to palpation in RUQ and LUQ without guarding or rebound. +BS in all 4 quadrants.  MS: bilateral upper and lower extremity edema improved Skin: warm and dry  Psych: A&Ox3 with an appropriate affect.    Lab Results:  Recent Labs    01/30/21 0247 01/30/21 1006 01/31/21 0242 01/31/21 0511  WBC 10.8*  --   --  10.0  HGB 7.7*  7.7*   < > 8.3* 8.5*   HCT 24.9*  24.9*   < > 25.7* 26.4*  PLT 202  --   --  202   < > = values in this interval not displayed.   BMET Recent Labs    01/30/21 0247 01/31/21 0242  NA 141 144  K 4.9 3.7  CL 113* 113*  CO2 21* 22  GLUCOSE 164* 118*  BUN 98* 98*  CREATININE 1.80* 1.81*  CALCIUM 8.3* 8.3*   PT/INR No results for input(s): LABPROT, INR in the last 72 hours. CMP     Component Value Date/Time   NA 144 01/31/2021 0242   NA 141 10/06/2016 0830   K 3.7 01/31/2021 0242   CL 113 (H) 01/31/2021 0242   CO2 22 01/31/2021 0242   GLUCOSE 118 (H) 01/31/2021 0242   BUN 98 (H) 01/31/2021 0242   BUN 29 (H) 10/06/2016 0830   CREATININE 1.81 (H) 01/31/2021 0242   CALCIUM 8.3 (L) 01/31/2021 0242   PROT 5.0 (L) 01/31/2021 0242   PROT 7.4 09/22/2016 0913   ALBUMIN 2.0 (L) 01/31/2021 0242   ALBUMIN 4.7 09/22/2016 0913   AST 29 01/31/2021 0242   ALT 72 (H) 01/31/2021 0242   ALKPHOS 55 01/31/2021 0242   BILITOT 1.2 01/31/2021 0242   BILITOT 1.0 09/22/2016 0913   GFRNONAA 39 (L) 01/31/2021 0242   GFRAA 59 (L) 04/29/2018 1012   Lipase  No results found for: LIPASE     Studies/Results: CT ABDOMEN PELVIS WO CONTRAST  Result Date: 01/30/2021 CLINICAL DATA:  Ileus.  EXAM: CT ABDOMEN AND PELVIS WITHOUT CONTRAST TECHNIQUE: Multidetector CT imaging of the abdomen and pelvis was performed following the standard protocol without IV contrast. COMPARISON:  01/26/2021 FINDINGS: Lower chest: Bibasilar atelectasis, left greater than right. Stable elevation of the left hemidiaphragm. Extensive coronary artery calcification. Hypoattenuation of the cardiac blood pool is in keeping with at least mild anemia. Mild global cardiomegaly again noted. Hepatobiliary: No focal liver abnormality is seen. No gallstones, gallbladder wall thickening, or biliary dilatation. Pancreas: Unremarkable Spleen: Unremarkable Adrenals/Urinary Tract: The adrenal glands are unremarkable. The kidneys are normal in size and position.  Multiple simple cortical cysts are seen bilaterally measuring up to 4.2 cm in diameter on the right and 4.5 cm in diameter on the left. There is no hydronephrosis. No intrarenal or ureteral calcifications are seen. The bladder is unremarkable. Stomach/Bowel: The stomach is distended. The small bowel is diffusely mildly dilated and gas and fluid-filled. The ascending colon and transverse colon are, additionally, mildly distended with gas. The findings, together, are in keeping with an underlying ileus. Moderate to severe sigmoid diverticulosis. The appendix is unremarkable. No free intraperitoneal gas. Trace ascites is present. Vascular/Lymphatic: Mild aortoiliac atherosclerotic calcification. No aortic aneurysm. No pathologic adenopathy within the abdomen and pelvis. Reproductive: Moderate prostatic enlargement. Seminal vesicles are unremarkable. Other: There is moderate diffuse subcutaneous body wall edema. No abdominal wall hernia. The rectum contains oral contrast from prior CT examination of 01/26/2021. Musculoskeletal: Osseous structures are age-appropriate. No acute bone abnormality. No lytic or blastic bone lesion. IMPRESSION: Stable elevation of the left hemidiaphragm. Stable cardiomegaly.  Findings in keeping with at least mild anemia. Interval removal of the nasogastric tube with distension of the stomach. Diffusely distended small and large bowel in keeping with underlying ileus, similar to that noted on prior examination. Diffuse body wall subcutaneous edema and mild ascites in keeping with anasarca. Aortic Atherosclerosis (ICD10-I70.0). Electronically Signed   By: Fidela Salisbury MD   On: 01/30/2021 01:58   DG Abd 1 View  Result Date: 01/30/2021 CLINICAL DATA:  NG tube placement. EXAM: ABDOMEN - 1 VIEW COMPARISON:  None. FINDINGS: NG tube is in place with the tip in the fundus of the stomach. IMPRESSION: NG tube projects in good position. Electronically Signed   By: Inge Rise M.D.   On:  01/30/2021 13:35    Anti-infectives: Anti-infectives (From admission, onward)   Start     Dose/Rate Route Frequency Ordered Stop   01/31/21 1200  fluconazole (DIFLUCAN) IVPB 100 mg        100 mg 50 mL/hr over 60 Minutes Intravenous Every 24 hours 01/31/21 1113 02/10/21 1159   01/20/21 1830  vancomycin (VANCOREADY) IVPB 2000 mg/400 mL        2,000 mg 200 mL/hr over 120 Minutes Intravenous  Once 01/20/21 1738 01/20/21 2005   01/20/21 1737  vancomycin variable dose per unstable renal function (pharmacist dosing)  Status:  Discontinued         Does not apply See admin instructions 01/20/21 1738 01/22/21 0729   01/20/21 1400  cefTRIAXone (ROCEPHIN) 2 g in sodium chloride 0.9 % 100 mL IVPB        2 g 200 mL/hr over 30 Minutes Intravenous Every 24 hours 01/20/21 1347 01/25/21 1359   01/16/21 2000  vancomycin (VANCOREADY) IVPB 1250 mg/250 mL       Note to Pharmacy: Per pharmacy   1,250 mg 166.7 mL/hr over 90 Minutes Intravenous Every 12 hours 01/16/21 1329 01/16/21 2155   01/16/21 0600  vancomycin (  VANCOREADY) IVPB 1500 mg/300 mL        1,500 mg 150 mL/hr over 120 Minutes Intravenous On call to O.R. 01/15/21 0839 01/16/21 1000   01/09/21 0600  vancomycin (VANCOREADY) IVPB 1500 mg/300 mL        1,500 mg 150 mL/hr over 120 Minutes Intravenous On call to O.R. 01/08/21 0806 01/10/21 0559   01/09/21 0600  gentamicin (GARAMYCIN) 410 mg in dextrose 5 % 100 mL IVPB        5 mg/kg  82.8 kg (Adjusted) 220.5 mL/hr over 30 Minutes Intravenous On call to O.R. 01/08/21 0813 01/10/21 0559       Assessment/Plan   Ileus s/p orthopedic surgery - CT 5/25 with distension of stomach, small and large bowel - ileus similar to prior CT (5/21). Oral contrast in rectum from prior CT - EGD 5/26 - findings consistent with candida infection and minimal inflammation/gastritis - NGT out 5/23. Replaced at time of EGD today 5/26 - agree with GI to keep NG to LIWS today. Can have ice chips - reglan per GI 5/26 -  mobilize as tolerated to help recovery  - continued TPN - getting daily suppository. PO colace added 5/24  Concern for upper GI bleeding gastritis.   - Hgb 8.5 s/p 1 unit PRBCs 5/25 - EGD 5/26 - findings consistent with candida infection and minimal inflammation/gastritis - continuous PPIs  FEN: NGT and ice chips, TPN VTE prophylaxis: SCDs    LOS: 15 days    Winferd Humphrey, [facility] Surgery 01/31/2021, 11:15 AM Please see Amion for pager number during day hours 7:00am-4:30pm

## 2021-01-31 NOTE — TOC Progression Note (Signed)
Transition of Care [facility]) - Progression Note    Patient Details  Name: [name] MRN: [id number] Date of Birth: 05-Dec-1945  Transition [facility]) CM/SW Contact  Leeroy Cha, RN Phone Number: 01/31/2021, 8:12 AM  Clinical Narrative:    On 01/25/2021 NGT output was noted to be coffee-ground for which GI was consulted.  Was also noted to have acute blood loss anemia.  On 01/26/2021 he was noted to have symptomatic anemia 2 unit PRBC were ordered to be transfused.  Heparin drip stopped.  Cardiology consulted.  Hemoglobin dropped on 01/28/2021 post 1 unit PRBC transfusion.  NG tube removed on 01/28/2021 and replaced on 01/30/2021 due to evidence of persistent ileus.  01/30/21: Patient was seen and examined with his wife at his bedside.  He is hypersomnolent after received a dose of IV Dilaudid this morning.  Hemoglobin dropped to 7.4 this morning.  Seen by GI with plan for EGD on 01/31/2021 if the patient is stable.  PLAN: patient to unstable to determine due to new gi bleed. Expected Discharge Plan: [place] Barriers to Discharge: Barriers Resolved  Expected Discharge Plan and Services Expected Discharge Plan: [place] In-house Referral: Clinical Social Work   Post Acute Care Choice: [place] Living arrangements for the past 2 months: [place] Expected Discharge Date: 01/20/21               DME Arranged: 3-N-1,[place] rolling DME Agency: [employer] Date DME Agency Contacted: 01/19/21 Time DME Agency Contacted: [id number] Representative spoke with at DME Agency: [place]: PT [place]: [facility] [place] office) Date [place]: 01/18/21 Time [place]: 1000 Representative spoke with at [place] (ph# [phone]; fax # [phone])   Social Determinants of Health ([place]) Interventions    Readmission Risk Interventions Readmission Risk Prevention Plan  01/17/2021  Transportation Screening Complete  PCP or Specialist Appt within 5-7 Days Complete  Home Care Screening Complete  Some recent data might be hidden

## 2021-01-31 NOTE — Progress Notes (Signed)
PHARMACY - TOTAL PARENTERAL NUTRITION CONSULT NOTE   Indication: Prolonged ileus  Patient Measurements: Height: '5\' 8"'  (172.7 cm) Weight: 108.5 kg (239 lb 3.2 oz) IBW/kg (Calculated) : 68.4 TPN AdjBW (KG): 77.4 Body mass index is 36.37 kg/m. 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 Post op course complicated by Afib, aspiration PNA and prolonged ileus. Pharmacy consulted to manage TPN for nutrition support.  Glucose / Insulin: Hx DM2 on only metformin PTA, A1c 5.7.  Solucortef d/c 5/20  Serum Glucose with AM labs: 118; CBG range 118-133   Regular Insulin in TPN:  Added 5/21, increased 28 units 5/25  Lantus 8 qday to 8 q12 (increased by TRH on 5/22) - discontinued 5/25 since NPO  Moderate SSI 15 units/24 hrs; 4 units since new TPN hung at 6p last night  Electrolytes: K decreased to 3.7 (concentration in TPN decreased yesterday due to AKI), goal >=4; Mg 2.2 (2.3 today); Phos improved to 3.9, CoCa 9.9 WNL; Cl elevated at 113, CO2 22 Renal: SCr up to 1.81 (lasix 5/23, 40 IV q12h started 5/25, contrast 5/24 PM); BUN up to 98  Postop AKI w/ SCr peaking at 4 (baseline 1.4) , then was improving, but now increasing again.  Continues to have diffuse edema, anasarca.  Hepatic: AST and Alk Phos WNL, ALT increased 72; mild bump in tbili resolved; Trig 98 (5/10)> 123 (5/23) Preabumin 10 (5/19), 13.5 (5/23) I/O, mIVF:  IVF dc'd 5/24, Amio gtt changed to PO 5/25  NGT in good position, keep at LIS  I/O net +269 mL; UOP 2650 mL, 400cc emesis.  One brown/black liquid stool note 5/25  Relistor x2 (5/15, 5/17), Bisacodyl supp daily (5/21 > ).  Scheduled IV reglan (5/15-5/23) and miralax BID (5/18-5/21).  Docusate daily (5/24) GI Imaging:  - 5/18 CT A/P: ileus vs distal SBO -5/19 KUB: diffuse gaseous bowel distention persists -5/20 KUB: persistent & stable SBO -5/21 KUB: unchanged gaseous distention of colon & SB, contrast from 2 days ago in cecum - 5/24  CT: Diffusely distended small and large bowel in keeping with underlying ileus. Diffuse body wall subcutaneous edema, mild ascites in keeping with anasarca. GI Surgeries / Procedures:  5/26 EGD: esophageal candidiasis, very minimal non-specific gastritis of doubtful clinical significance  Central access: CVC; PICC placed 5/19 for TPN TPN start date: 01/23/21  Nutritional Goals (RD recommendations 5/18):  Kcal:  1880-2090 kcal, Protein:  90-100 grams, Fluid:  >/= 2 L/day  Current TPN at goal rate of 80 mL/hr provides 106 g of protein, 1978 kcals per day  Keep K >/= 4 and Mg >/=2 with ileus  Current Nutrition:  TPN at goal rate Diet: CLD changed back to NPO 5/25; Boost and Prosource being held  Plan:   Potassium chloride 54mq IV x 2 runs   Continue TPN @ 859mhr - provides 100% of goals  Electrolytes in TPN:  Na - 5033mL  K - 30 mEq/L - increase  Ca - 5mE16m  Mg - 5 mEq/L  Phos - 5 mmol/L  Cl:Ac ratio: max Acetate  Add standard MVI and trace elements to TPN  Continue moderate scale SSI q4h and adjust as needed  Regular insulin 28 units in TPN   MIVF per MD, none currently  Monitor TPN labs on Mon/Thurs   ErinPeggyann JubaarmD, BCPS Clinical Pharmacist [facility] main pharmacy [phone]/2022 7:16 AM

## 2021-01-31 NOTE — Progress Notes (Signed)
PHYSICAL THERAPY  Pt would benefit from a CPM to his L TKR during his acute care stay. Starting parameters 10 - 40 degrees     Increase 10 degrees per day  Rica Koyanagi  PTA Acute  Rehabilitation Services Pager      [phone] Office      [phone]

## 2021-01-31 NOTE — Progress Notes (Signed)
PROGRESS NOTE  Shoua Ressler BZJ:[id number] DOB: Jun 10, 1946 DOA: 01/16/2021 PCP: Bing Neighbors, NP  HPI/Recap of past 70 hours: 75 year old man with hx of SBO, hypertension, hyperlipidemia, GERD, type 2 diabetes, presenting for TKR complicated by ileus, LLL PNA, new onset perioperative A. fib with RVR, noted on 01/20/2021 and started on amiodarone drip and heparin drip in the ICU.  He had acute hypoxic respiratory failure and was transferred to the ICU on 01/20/2021 and was seen by PCCM.  TRH, hospitalist team was initially consulted for hypotension requiring vasopressors, acute kidney injury with a creatinine of 4 and constipation with work-up of ileus versus SBO.  NG tube placed on 01/20/2021.  NG tube removed on 01/28/2021.  On 01/25/2021 NGT output was noted to be coffee-ground for which GI was consulted.  Was also noted to have acute blood loss anemia.  On 01/26/2021 he was noted to have symptomatic anemia 2 unit PRBC were ordered to be transfused.  Heparin drip stopped.  Cardiology consulted.  Hemoglobin dropped on 01/28/2021 post 1 unit PRBC transfusion.  NG tube removed on 01/28/2021 and replaced on 01/30/2021 due to evidence of persistent ileus.  1 unit PRBC transfused for hemoglobin of 7.4 on 01/30/2021.  Plan for EGD on 01/31/2021.  01/31/21: Mr. Wendorff feels better this morning.  He is more alert and responding to questions appropriately.  Less work of breathing and less abdominal distention this morning while on IV Lasix.   Assessment/Plan: Principal Problem:   Ileus ([place]) Active Problems:   Type 2 diabetes mellitus without complication, without long-term current use of insulin (HCC)   Essential hypertension   Obesity (BMI 30-39.9)   CAD (coronary artery disease)   Pulmonary HTN (HCC)   S/P TKR (total knee replacement) using cement   Acute respiratory distress   AKI (acute kidney injury) ([place])   Acute renal failure (HCC)   Benign prostatic hyperplasia   Obstructive sleep apnea treated  with continuous positive airway pressure (CPAP)   Hypotension   GERD (gastroesophageal reflux disease)   Protein-calorie malnutrition, moderate (HCC)   Gastrointestinal bleed   PAF (paroxysmal atrial fibrillation) (HCC)   Acute blood loss anemia   History of gastritis   Irritable bowel syndrome with intermittent diarrhea   SOB (shortness of breath)   Anasarca  Status post total left knee replacement 01/16/21 by Dr. [name] POD #15 Management per primary, orthopedic surgery. Out of bed to chair with every shift. Mobilize as tolerated Continue PT OT with assistance and fall precautions.  Persistent ileus, partial small bowel obstruction History of small bowel obstruction treated medically at outside facility in [place] in 2019. NG tube was removed on 01/28/2021, NG tube was reinserted on 01/30/2021 with evidence of persistent ileus. Continue to replete and optimize magnesium and potassium levels. Mobilize as tolerated. Appreciate general surgery and GI consultative services.  Acute blood loss symptomatic anemia secondary to suspected upper GI bleed. History of gastritis. Gastric content sent on 01/25/2021 positive for occult blood. Drop of hemoglobin 6.8 on 01/26/2021, transfused 2 unit PRBCs. Repeat hemoglobin 7.9 on 01/27/2021. Drop of hemoglobin 7.1 on 01/28/2021.  Transfused 1 unit PRBCs.   Hemoglobin 7.4 on 01/30/2021, transfuse 1 unit PRBC. Received Protonix bolus on 01/26/2021.  Protonix drip stopped on 01/30/2021, IV Protonix 40 mg twice daily started. Hemoglobin 15.1 on 01/07/2021. Planned for EGD on 01/31/2021.  Appreciate GI's assistance.  Acute on chronic diastolic CHF Volume overload on exam. Started on IV Lasix 40 mg twice daily on  01/30/2021 by cardiology with improvement of symptomatology, less work of breathing, less abdominal distention. Last 2D echo done on 01/21/2021 showed LVEF 50 to 55% Continue strict I's and O's and daily weight BNP 69 likely not accurate in  the setting of obesity with BMI of 36. Net I&O +11.7 L>> 10.0 L  Resolved hyperammonemia, suspect upper GI bleed Ammonia level 71>> 21 post lactulose. 1 dose of lactulose enema given on 01/30/2021.  Resolved acute toxic metabolic encephalopathy likely in the setting of hyperammonemia Hypersomnolent on 01/30/2021. He is alert and oriented x3 Avoid sedating agents, opiates and benzodiazepines.  Acute hypoxic hypercarbic respiratory failure secondary to recent left lower lobe pneumonia Not on oxygen supplementation at baseline Maintain O2 saturation greater than 90%.  Resolving mixed metabolic and respiratory acidosis Serum bicarb 21, pH 7.218, PCO2 48 on venous blood gas Give 2 A of bicarb on 01/30/2021 Bicarb also in TPN NG tube placed on 01/30/2021 which makes it difficult to use CPAP Serum bicarb 22 on 01/31/2021.  QTC prolongation. Twelve-lead EKG 01/28/2021 QTC 534. Minimize QTC prolonging agents as possible. DC'd Reglan. DC'd Zofran and replace with Tigan for nausea Protonix drip stopped, currently on IV Protonix 40 mg twice daily.  New onset perioperative A. fib with RVR, converted to sinus rhythm on 01/28/2021. CHA2DS2-VASc of 4 Was noted on 01/20/2021: Patient was transferred to the ICU Was started on amiodarone drip and heparin drip on 01/20/2021 in the ICU. Heparin drip has been DC'd on 01/26/2021 due to upper GI bleed. Amiodarone drip was DC'd on 01/30/2021 and switched to oral however NG tube has been replaced to suction. 2D echo was done on 01/21/2021 left atrium normal in size.  EF 50-55%. Twelve-lead EKG on 01/28/2021, converted to sinus rhythm.  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 Creatinine uptrending this morning 1.81 from 1.80 from 1.69 from 1.58. Continue to avoid nephrotoxic agents, dehydration and hypotension. Repeat chemistry panel in the morning  OSA on CPAP Avoid BiPAP/CPAP with NG tube in place.  Essential hypertension DC  Solu-Cortef, stress dose steroids on 01/25/2021 IV Lopressor as needed with parameters. BP is currently stable. Avoid hypotension due to AKI. Continue to monitor vital signs  Resolved post repletion: Hypokalemia Potassium 3.4>> 3.9> 4.5> 3.7. Repleted with TPN  Prediabetes with hyperglycemia  Hemoglobin A1c 5.7 on 01/16/2021 Insulin sliding scale if needed Avoid hypoglycemia  Electrolyte derangement on TPN Hypokalemia, repleted with TPN Hyperglycemia, dose adjustment by pharmacy Hypophosphatemia, repleted with TPN Appreciate pharmacy's assistance  Elevated troponin Peaked at 23 Heparin drip DC'd on 01/26/2021 due to upper GI bleed. Denies any anginal symptoms at the time of this exam. Cardiology following  GERD Protonix 40 mg twice daily. Defer to GI.  Gout Resume home regimen when can take adequate oral   Code Status: Full code.  Family Communication: Updated his wife at bedside on 01/30/2021.  Disposition Plan: Defer to primary team.   Thank you for allowing [place] to participate in the care of this patient.  We will continue to follow along with you.     Objective: Vitals:   01/31/21 0422 01/31/21 0500 01/31/21 0600 01/31/21 0630  BP:   (!) 152/42 (!) 159/45  Pulse:   76 72  Resp:   (!) 23 (!) 29  Temp: (!) 97.4 F (36.3 C)     TempSrc: Oral     SpO2:   98% 96%  Weight:  108.5 kg    Height:  Intake/Output Summary (Last 24 hours) at 01/31/2021 0826 Last data filed at 01/31/2021 0600 Gross per 24 hour  Intake 2959.03 ml  Output 3050 ml  Net -90.97 ml   Filed Weights   01/29/21 1400 01/30/21 1027 01/31/21 0500  Weight: 112.2 kg 112.2 kg 108.5 kg    Exam:  . General: 75 y.o. year-old male weak appearing, pleasant in no acute attacks.  He is alert and oriented x3.   . Cardiovascular: Regular rate and rhythm no rubs or gallops. Marland Kitchen Respiratory: Mild rales at bases with no wheezing noted.  Good inspiratory effort.   . Abdomen: Obese soft hypoactive  bowel sounds present.  Nontender with palpation. . Musculoskeletal: Surgical strips to right knee.  1+ pitting edema noted in left lower extremity. Marland Kitchen Psychiatry: Mood is appropriate for condition and setting.   Data Reviewed: CBC: Recent Labs  Lab 01/27/21 0541 01/27/21 1154 01/28/21 0422 01/28/21 1516 01/29/21 0914 01/29/21 1800 01/30/21 0247 01/30/21 1006 01/30/21 2203 01/31/21 0242 01/31/21 0511  WBC 17.4*  --  14.3*  --  11.2*  --  10.8*  --   --   --  10.0  NEUTROABS  --   --  11.6*  --   --   --   --   --   --   --   --   HGB 7.9*   < > 7.1*   < > 8.5*   < > 7.7*  7.7* 7.4* 8.0* 8.3* 8.5*  HCT 25.4*   < > 22.5*   < > 27.1*   < > 24.9*  24.9* 23.7* 23.9* 25.7* 26.4*  MCV 95.8  --  96.6  --  95.1  --  96.5  --   --   --  94.3  PLT 178  --  159  --  189  --  202  --   --   --  202   < > = values in this interval not displayed.   Basic Metabolic Panel: Recent Labs  Lab 01/27/21 0509 01/28/21 0422 01/29/21 0402 01/30/21 0247 01/31/21 0242  NA 144 144 142 141 144  K 3.9 4.5 4.8 4.9 3.7  CL 111 116* 115* 113* 113*  CO2 22 23 20* 21* 22  GLUCOSE 333* 177* 180* 164* 118*  BUN 83* 95* 86* 98* 98*  CREATININE 1.29* 1.58* 1.69* 1.80* 1.81*  CALCIUM 8.3* 8.5* 8.4* 8.3* 8.3*  MG 2.1 2.2 2.2 2.2 2.3  PHOS 3.4 3.7 4.8* 4.2 3.9   GFR: Estimated Creatinine Clearance: 42.1 mL/min (A) (by C-G formula based on SCr of 1.81 mg/dL (H)). Liver Function Tests: Recent Labs  Lab 01/28/21 0422 01/31/21 0242  AST 41 29  ALT 49* 72*  ALKPHOS 46 55  BILITOT 0.8 1.2  PROT 5.0* 5.0*  ALBUMIN 2.3* 2.0*   No results for input(s): LIPASE, AMYLASE in the last 168 hours. Recent Labs  Lab 01/30/21 1106 01/31/21 0510  AMMONIA 71* 21   Coagulation Profile: No results for input(s): INR, PROTIME in the last 168 hours. Cardiac Enzymes: No results for input(s): CKTOTAL, CKMB, CKMBINDEX, TROPONINI in the last 168 hours. BNP (last 3 results) No results for input(s): PROBNP in the  last 8760 hours. HbA1C: No results for input(s): HGBA1C in the last 72 hours. CBG: Recent Labs  Lab 01/30/21 1608 01/30/21 1954 01/30/21 2340 01/31/21 0415 01/31/21 0737  GLUCAP 172* 124* 133* 118* 126*   Lipid Profile: No results for input(s): CHOL, HDL, LDLCALC, TRIG, CHOLHDL, LDLDIRECT in  the last 72 hours. Thyroid Function Tests: No results for input(s): TSH, T4TOTAL, FREET4, T3FREE, THYROIDAB in the last 72 hours. Anemia Panel: No results for input(s): VITAMINB12, FOLATE, FERRITIN, TIBC, IRON, RETICCTPCT in the last 72 hours. Urine analysis:    Component Value Date/Time   COLORURINE AMBER (A) 01/19/2021 1622   APPEARANCEUR CLOUDY (A) 01/19/2021 1622   LABSPEC 1.018 01/19/2021 1622   PHURINE 5.0 01/19/2021 1622   GLUCOSEU NEGATIVE 01/19/2021 1622   HGBUR MODERATE (A) 01/19/2021 1622   BILIRUBINUR NEGATIVE 01/19/2021 1622   KETONESUR NEGATIVE 01/19/2021 1622   PROTEINUR 100 (A) 01/19/2021 1622   NITRITE NEGATIVE 01/19/2021 1622   LEUKOCYTESUR NEGATIVE 01/19/2021 1622   Sepsis Labs: @LABRCNTIP (procalcitonin:4,lacticidven:4)  )No results found for this or any previous visit (from the past 240 hour(s)).    Studies: DG Abd 1 View  Result Date: 01/30/2021 CLINICAL DATA:  NG tube placement. EXAM: ABDOMEN - 1 VIEW COMPARISON:  None. FINDINGS: NG tube is in place with the tip in the fundus of the stomach. IMPRESSION: NG tube projects in good position. Electronically Signed   By: Inge Rise M.D.   On: 01/30/2021 13:35    Scheduled Meds: . (feeding supplement) PROSource Plus  30 mL Oral BID BM  . allopurinol  100 mg Oral QHS  . amiodarone  200 mg Oral BID  . bisacodyl  10 mg Rectal Daily  . Chlorhexidine Gluconate Cloth  6 each Topical Daily  . docusate sodium  100 mg Oral Daily  . feeding supplement  1 Container Oral BID BM  . furosemide  40 mg Intravenous BID  . insulin aspart  0-15 Units Subcutaneous Q4H  . lip balm  1 application Topical BID  . mouth rinse   15 mL Mouth Rinse BID  . metoprolol succinate  12.5 mg Oral Daily  . pantoprazole  40 mg Intravenous Q12H  . sodium chloride flush  10-40 mL Intracatheter Q12H    Continuous Infusions: . sodium chloride 0 mL (01/29/21 0305)  . TPN ADULT (ION) 80 mL/hr at 01/31/21 0600     LOS: 15 days     Kayleen Memos, MD [facility] Pager [phone]  If 7PM-7AM, please contact night-coverage [email or web address] Password [facility] 01/31/2021, 8:26 AM

## 2021-01-31 NOTE — Progress Notes (Signed)
Progress Note  Patient Name: [name] Date of Encounter: 01/31/2021  [facility] Cardiologist: Ida Rogue, MD   Subjective   Hemoglobin improved to 8.5 this morning.  Underwent EGD which showed Candida esophagitis but otherwise unremarkable.  Reports dyspnea improved.  Inpatient Medications    Scheduled Meds: . (feeding supplement) PROSource Plus  30 mL Oral BID BM  . allopurinol  100 mg Oral QHS  . amiodarone  200 mg Oral BID  . bisacodyl  10 mg Rectal Daily  . Chlorhexidine Gluconate Cloth  6 each Topical Daily  . docusate sodium  100 mg Oral Daily  . feeding supplement  1 Container Oral BID BM  . furosemide  40 mg Intravenous BID  . insulin aspart  0-15 Units Subcutaneous Q4H  . lip balm  1 application Topical BID  . mouth rinse  15 mL Mouth Rinse BID  . metoprolol succinate  12.5 mg Oral Daily  . pantoprazole  40 mg Intravenous Q12H  . sodium chloride flush  10-40 mL Intracatheter Q12H   Continuous Infusions: . sodium chloride Stopped (01/31/21 1007)  . potassium chloride    . TPN ADULT (ION) 80 mL/hr at 01/31/21 0600  . TPN ADULT (ION)     PRN Meds: acetaminophen, alum & mag hydroxide-simeth, diphenhydrAMINE, HYDROmorphone (DILAUDID) injection, magic mouthwash, menthol-cetylpyridinium, metoprolol tartrate, naphazoline-glycerin, phenol, sodium chloride, sodium chloride flush, trimethobenzamide   Vital Signs    Vitals:   01/31/21 0954 01/31/21 0957 01/31/21 1000 01/31/21 1003  BP: (!) 120/30 (!) 126/29 (!) 125/34 (!) 131/29  Pulse: 84 86 84 81  Resp: 16 16 20  (!) 26  Temp:      TempSrc:      SpO2: 96% 94% 93% 94%  Weight:      Height:        Intake/Output Summary (Last 24 hours) at 01/31/2021 1102 Last data filed at 01/31/2021 1001 Gross per 24 hour  Intake 3159.03 ml  Output 3700 ml  Net -540.97 ml   Last 3 Weights 01/31/2021 01/30/2021 01/29/2021  Weight (lbs) 239 lb 3.2 oz 247 lb 5.7 oz 247 lb 5.7 oz  Weight (kg) 108.5 kg 112.2 kg 112.2 kg       Telemetry    Sinus rhythm with rates 70-90s- Personally Reviewed  ECG    Sinus rhythm, rate 92 bpm, RBBB, PACs, no STE/D, no significant change from previous  - Personally Reviewed  Physical Exam   GEN:  In no acute distress Neck: No JVD Cardiac: RRR, no murmurs, rubs, or gallops.  Respiratory: CTAB GI: nontender, mildly distended  MS: 2+ LE edema L>R s/p L TKR with incision site dressing in place C/D/I Neuro:  Nonfocal  Psych: Normal affect   Labs    High Sensitivity Troponin:   Recent Labs  Lab 01/20/21 1220 01/20/21 2131  TROPONINIHS 21* 23*      Chemistry Recent Labs  Lab 01/28/21 0422 01/29/21 0402 01/30/21 0247 01/31/21 0242  NA 144 142 141 144  K 4.5 4.8 4.9 3.7  CL 116* 115* 113* 113*  CO2 23 20* 21* 22  GLUCOSE 177* 180* 164* 118*  BUN 95* 86* 98* 98*  CREATININE 1.58* 1.69* 1.80* 1.81*  CALCIUM 8.5* 8.4* 8.3* 8.3*  PROT 5.0*  --   --  5.0*  ALBUMIN 2.3*  --   --  2.0*  AST 41  --   --  29  ALT 49*  --   --  72*  ALKPHOS 46  --   --  55  BILITOT 0.8  --   --  1.2  GFRNONAA 45* 42* 39* 39*  ANIONGAP 5 7 7 9      Hematology Recent Labs  Lab 01/29/21 0914 01/29/21 1800 01/30/21 0247 01/30/21 1006 01/30/21 2203 01/31/21 0242 01/31/21 0511  WBC 11.2*  --  10.8*  --   --   --  10.0  RBC 2.85*  --  2.58*  --   --   --  2.80*  HGB 8.5*   < > 7.7*  7.7*   < > 8.0* 8.3* 8.5*  HCT 27.1*   < > 24.9*  24.9*   < > 23.9* 25.7* 26.4*  MCV 95.1  --  96.5  --   --   --  94.3  MCH 29.8  --  29.8  --   --   --  30.4  MCHC 31.4  --  30.9  --   --   --  32.2  RDW 18.8*  --  18.7*  --   --   --  18.0*  PLT 189  --  202  --   --   --  202   < > = values in this interval not displayed.    BNP Recent Labs  Lab 01/29/21 0402  BNP 69.1     DDimer No results for input(s): DDIMER in the last 168 hours.   Radiology    CT ABDOMEN PELVIS WO CONTRAST  Result Date: 01/30/2021 CLINICAL DATA:  Ileus. EXAM: CT ABDOMEN AND PELVIS WITHOUT CONTRAST  TECHNIQUE: Multidetector CT imaging of the abdomen and pelvis was performed following the standard protocol without IV contrast. COMPARISON:  01/26/2021 FINDINGS: Lower chest: Bibasilar atelectasis, left greater than right. Stable elevation of the left hemidiaphragm. Extensive coronary artery calcification. Hypoattenuation of the cardiac blood pool is in keeping with at least mild anemia. Mild global cardiomegaly again noted. Hepatobiliary: No focal liver abnormality is seen. No gallstones, gallbladder wall thickening, or biliary dilatation. Pancreas: Unremarkable Spleen: Unremarkable Adrenals/Urinary Tract: The adrenal glands are unremarkable. The kidneys are normal in size and position. Multiple simple cortical cysts are seen bilaterally measuring up to 4.2 cm in diameter on the right and 4.5 cm in diameter on the left. There is no hydronephrosis. No intrarenal or ureteral calcifications are seen. The bladder is unremarkable. Stomach/Bowel: The stomach is distended. The small bowel is diffusely mildly dilated and gas and fluid-filled. The ascending colon and transverse colon are, additionally, mildly distended with gas. The findings, together, are in keeping with an underlying ileus. Moderate to severe sigmoid diverticulosis. The appendix is unremarkable. No free intraperitoneal gas. Trace ascites is present. Vascular/Lymphatic: Mild aortoiliac atherosclerotic calcification. No aortic aneurysm. No pathologic adenopathy within the abdomen and pelvis. Reproductive: Moderate prostatic enlargement. Seminal vesicles are unremarkable. Other: There is moderate diffuse subcutaneous body wall edema. No abdominal wall hernia. The rectum contains oral contrast from prior CT examination of 01/26/2021. Musculoskeletal: Osseous structures are age-appropriate. No acute bone abnormality. No lytic or blastic bone lesion. IMPRESSION: Stable elevation of the left hemidiaphragm. Stable cardiomegaly.  Findings in keeping with at least  mild anemia. Interval removal of the nasogastric tube with distension of the stomach. Diffusely distended small and large bowel in keeping with underlying ileus, similar to that noted on prior examination. Diffuse body wall subcutaneous edema and mild ascites in keeping with anasarca. Aortic Atherosclerosis (ICD10-I70.0). Electronically Signed   By: Fidela Salisbury MD   On: 01/30/2021 01:58   DG Abd 1 View  Result Date: 01/30/2021 CLINICAL DATA:  NG tube placement. EXAM: ABDOMEN - 1 VIEW COMPARISON:  None. FINDINGS: NG tube is in place with the tip in the fundus of the stomach. IMPRESSION: NG tube projects in good position. Electronically Signed   By: Inge Rise M.D.   On: 01/30/2021 13:35    Cardiac Studies   TTE 01/21/2021 1. Poor acoustic windows limit study even with Definity. Overall LVEF  appears normal with no signficant wall motion abnormalities . Left  ventricular ejection fraction, by estimation, is 50 to 55%. The left  ventricle has low normal function. There is  mild left ventricular hypertrophy.  2. Right ventricular systolic function is moderately reduced. The right  ventricular size is mildly enlarged. There is moderately elevated  pulmonary artery systolic pressure.  3. Trivial mitral valve regurgitation.  4. The aortic valve was not well visualized. Aortic valve regurgitation  is not visualized. Mild to moderate aortic valve sclerosis/calcification  is present, without any evidence of aortic stenosis.  5. The inferior vena cava is dilated in size with <50% respiratory  variability, suggesting right atrial pressure of 15 mmHg.   R/LHC 2018:  Dist LAD lesion, 20 %stenosed.  The left ventricular ejection fraction is 55-65% by visual estimate. Assessment: 1. Minimal coronary plaque 2. Normal LV function EF 55-60% 3. Very mildly elevated pulmonary pressures (much lower than echo) 4. 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 Will get CT chest to further evaluate anomalous pulmonary vein. Given Qp/Qs < 1.5 and lack of RV strain will likely continue to tolerate well.   Echocardiogram 2018: Study Conclusions   - Left ventricle: The cavity size was normal. Wall thickness was  normal. Systolic function was normal. The estimated ejection  fraction was in the range of 50% to 55%. Wall motion was normal;  there were no regional wall motion abnormalities. Doppler  parameters are consistent with abnormal left ventricular  relaxation (grade 1 diastolic dysfunction).  - Ventricular septum: The contour showed systolic flattening. These  changes are consistent with RV pressure overload.  - Left atrium: The atrium was mildly dilated.  - Right ventricle: The cavity size was moderately dilated. Wall  thickness was mildly increased. Systolic function was mildly  reduced.  - Pulmonary arteries: Systolic pressure was moderately to severely  increased. PA peak pressure: 60 mm Hg (S).  - Inferior vena cava: The vessel was dilated. The respirophasic  diameter changes were blunted (< 50%), consistent with elevated  central venous pressure.   Patient Profile     [name] is a 74 y.o. 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  Course has been complicated on 03/08/7792 by dehydration and hypotension.  He subsequently developed an ileus and SBO.  He developed respiratory failure on 01/20/2021 due to aspiration.  VQ scan negative for PE.  He developed A. fib with RVR at that time.  Cardiology was consulted on 01/26/2021 for A. fib management in the setting of GI bleed and acute blood loss anemia.  Assessment & Plan    1. Paroxysmal atrial fibrillation: patient found to have atrial  fibrillation with RVR this admission in the setting of GIB, acute blood loss anemia, SBO, and aspiration PNA. Echo showed EF 50-55%, no RWMA, and no significant valvular abnormalities. He was NPO  and started on amiodarone gtt with conversion to sinus rhythm. Now tolerating CLD. Anticoagulation deferred in the setting of GIB despite CHA2DS2-VASc Score = 5 [CHF History: No, HTN History: Yes, Diabetes History: Yes, Stroke History: No, Vascular Disease History: Yes, Age Score: 2, Gender Score: 0], giving patient an annual risk of stroke 7.2 %. Hgb trickling back down to 7.1 today. He is maintaining sinus rhythm at this time - Continue amiodarone, have switched to PO 200 mg BID.   - Recommend starting eliquis 5mg  BID once Hgb stabilizes and he is cleared to start anticoagulation by GI  2. HTN: BP intermittently elevated. Home metoprolol succinate AND tartrate, lisinopril, spironolactone, HCTZ, lasix, and doxazosin on hold.   3. 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 RV was not dilated at that time, though now is dilated with dilated IVC on echo this admission. Felt this would not be an issue for him on Dr. Kathalene Frames assessment.  - Consider repeat surveillance echo outpatient to re-evaluate RV function in the future  4. Non-obstructive CAD: minimal LAD stenosis noted on LHC in 2018. No anginal complaints. HsTrop elevated to 23 this admission in the setting of Afib with RVR, GIB, SOB, and aspiration PNA. Not c/w ACS - Resume statin when tolerating po  5. Prolonged QTc: 534 on EKG this morning. Reglan and zofran discontinued.  - Continue to limit QT prolonging medications  6. Volume overload: suspect multifactorial in the setting of poor nutritional status (albumin 2.3) and volume resuscitation efforts this admission. He has 2-3+ LE edema L>R s/p L TKR. He has received 3 u PRBCs - Continue to monitor volume status closely.  - Appears volume  overloaded on exam and worsening renal function could be cardiorenal.  Checked CVP from PICC yesterday, was 16.  Started IV Lasix 40 mg twice daily.  Improved WOB today, weight down 8lbs but remains 13 pounds above admission weight.  Continue IV Lasix.  Remainder of care per primary team: - GIB with ongoing anemia requiring transfusion - SBO - Aspiration PNA - S/p TKR      For questions or updates, please contact [place] Please consult [email or web address] for contact info under        Signed, Donato Heinz, MD  01/31/2021, 11:02 AM

## 2021-02-01 DIAGNOSIS — K567 Ileus, unspecified: Secondary | ICD-10-CM | POA: Diagnosis not present

## 2021-02-01 DIAGNOSIS — I480 Paroxysmal atrial fibrillation: Secondary | ICD-10-CM | POA: Diagnosis not present

## 2021-02-01 DIAGNOSIS — I5031 Acute diastolic (congestive) heart failure: Secondary | ICD-10-CM

## 2021-02-01 LAB — GLUCOSE, CAPILLARY
Glucose-Capillary: 106 mg/dL — ABNORMAL HIGH (ref 70–99)
Glucose-Capillary: 109 mg/dL — ABNORMAL HIGH (ref 70–99)
Glucose-Capillary: 139 mg/dL — ABNORMAL HIGH (ref 70–99)
Glucose-Capillary: 140 mg/dL — ABNORMAL HIGH (ref 70–99)
Glucose-Capillary: 140 mg/dL — ABNORMAL HIGH (ref 70–99)
Glucose-Capillary: 142 mg/dL — ABNORMAL HIGH (ref 70–99)
Glucose-Capillary: 143 mg/dL — ABNORMAL HIGH (ref 70–99)
Glucose-Capillary: 156 mg/dL — ABNORMAL HIGH (ref 70–99)
Glucose-Capillary: 173 mg/dL — ABNORMAL HIGH (ref 70–99)

## 2021-02-01 LAB — CBC
HCT: 25.5 % — ABNORMAL LOW (ref 39.0–52.0)
Hemoglobin: 8 g/dL — ABNORMAL LOW (ref 13.0–17.0)
MCH: 29.5 pg (ref 26.0–34.0)
MCHC: 31.4 g/dL (ref 30.0–36.0)
MCV: 94.1 fL (ref 80.0–100.0)
Platelets: 231 10*3/uL (ref 150–400)
RBC: 2.71 MIL/uL — ABNORMAL LOW (ref 4.22–5.81)
RDW: 17.7 % — ABNORMAL HIGH (ref 11.5–15.5)
WBC: 8 10*3/uL (ref 4.0–10.5)
nRBC: 0.3 % — ABNORMAL HIGH (ref 0.0–0.2)

## 2021-02-01 LAB — BASIC METABOLIC PANEL
Anion gap: 5 (ref 5–15)
BUN: 87 mg/dL — ABNORMAL HIGH (ref 8–23)
CO2: 27 mmol/L (ref 22–32)
Calcium: 8.5 mg/dL — ABNORMAL LOW (ref 8.9–10.3)
Chloride: 113 mmol/L — ABNORMAL HIGH (ref 98–111)
Creatinine, Ser: 1.71 mg/dL — ABNORMAL HIGH (ref 0.61–1.24)
GFR, Estimated: 41 mL/min — ABNORMAL LOW (ref >60)
Glucose, Bld: 140 mg/dL — ABNORMAL HIGH (ref 70–99)
Potassium: 4 mmol/L (ref 3.5–5.1)
Sodium: 145 mmol/L (ref 135–145)

## 2021-02-01 LAB — MAGNESIUM: Magnesium: 2.2 mg/dL (ref 1.7–2.4)

## 2021-02-01 LAB — PHOSPHORUS: Phosphorus: 3.8 mg/dL (ref 2.5–4.6)

## 2021-02-01 MED ORDER — TRAVASOL 10 % IV SOLN
INTRAVENOUS | Status: AC
  Filled 2021-02-01: qty 1056

## 2021-02-01 MED ORDER — AMIODARONE HCL 200 MG PO TABS
200.0000 mg | ORAL_TABLET | Freq: Every day | ORAL | Status: DC
  Administered 2021-02-02 – 2021-02-06 (×5): 200 mg via ORAL
  Filled 2021-02-01 (×5): qty 1

## 2021-02-01 NOTE — Progress Notes (Addendum)
Progress Note   Subjective  Chief Complaint: Ileus, upper GI bleed  EGD 01/31/2021 with Candida esophagitis and very minimal nonspecific gastritis of doubtful clinical significance-at that time Reglan ordered 5 mg 3 times daily to help with ileus and a 10-day course of Diflucan  Today, the patient states that he is feeling much better.  He has actually had 2 bowel movements and is passing a lot of gas.  Tells me that he is eager to get something to eat.  Explains that he was seen by general surgery earlier this morning who plans on clamping his tube later this afternoon to see how he does.  Tells me he is not really been up and out of bed but is going to try later.  Denies any new complaints or concerns.   Objective   Vital signs in last 24 hours: Temp:  [97.9 F (36.6 C)-99.6 F (37.6 C)] 98.7 F (37.1 C) (05/27 0800) Pulse Rate:  [67-81] 80 (05/27 0949) Resp:  [17-34] 34 (05/27 0848) BP: (108-164)/(44-59) 156/44 (05/27 0805) SpO2:  [93 %-100 %] 97 % (05/27 0848) Last BM Date: 02/01/21 General:    white male in NAD +NG tube in place  Heart:  Regular rate and rhythm; no murmurs Lungs: Respirations even and unlabored, lungs CTA bilaterally Abdomen:  Soft, nontender and mild distension. Normal bowel sounds. Psych:  Cooperative. Normal mood and affect.  Lab Results: Recent Labs    01/30/21 0247 01/30/21 1006 01/31/21 0242 01/31/21 0511 02/01/21 0259  WBC 10.8*  --   --  10.0 8.0  HGB 7.7*  7.7*   < > 8.3* 8.5* 8.0*  HCT 24.9*  24.9*   < > 25.7* 26.4* 25.5*  PLT 202  --   --  202 231   < > = values in this interval not displayed.   BMET Recent Labs    01/30/21 0247 01/31/21 0242 02/01/21 0259  NA 141 144 145  K 4.9 3.7 4.0  CL 113* 113* 113*  CO2 21* 22 27  GLUCOSE 164* 118* 140*  BUN 98* 98* 87*  CREATININE 1.80* 1.81* 1.71*  CALCIUM 8.3* 8.3* 8.5*   LFT Recent Labs    01/31/21 0242  PROT 5.0*  ALBUMIN 2.0*  AST 29  ALT 72*  ALKPHOS 55  BILITOT 1.2    Studies/Results: DG Abd 1 View  Result Date: 01/30/2021 CLINICAL DATA:  NG tube placement. EXAM: ABDOMEN - 1 VIEW COMPARISON:  None. FINDINGS: NG tube is in place with the tip in the fundus of the stomach. IMPRESSION: NG tube projects in good position. Electronically Signed   By: Inge Rise M.D.   On: 01/30/2021 13:35    Assessment / Plan:   Assessment: 1.  Postop ileus: Status post left TKA 01/16/2021, ileus now improving overnight with the addition of Reglan 5 mg 3 times daily yesterday, now having had 2 bowel movements and passing a lot of gas 2.  Upper GI bleed: Coffee-ground output per NGT on 5/20, this was thought likely due to NGT trauma, hemoglobin dropped to 6.8--> 2 units PRBC BCs 5/21-->7.3 --> 7.1--> 1 unit PRBCs--> 8.1 on 5/24; overnight stable at 8.5 3.  Anemia 4.  A. fib with RVR 5.  Nonobstructive CAD 6.  Candida esophagitis 7.  Respiratory failure, pneumonia on oxygen  Plan: 1.  Continue Diflucan for a 10-day course.  This is IV for now, but can be switched to oral if he starts reliably back on p.o. intake. 2.  For now continue 5 mg of Reglan IV 3 times daily 3.  Agree with trial of tube clamping later today as per surgery 4.  Pharmacist did ask about TPN, will continue for today, but as long as he is improving then we can see how he does with oral intake in the next 24 to 48 hours. 5.  Please await any further recommendations from Dr. Ardis Hughs later today.  Thank you for your kind consultation we will continue to follow.   LOS: 16 days   Levin Erp  02/01/2021, 10:03 AM  ________________________________________________________________________  Velora Heckler GI MD note:  I personally examined the patient, reviewed the data and agree with the assessment and plan described above.  He seems overall quite a bit better this afternoon. Has been passing gas, had BM.  His spirits are clearly improved as well.  Abd still a bit distended but not nearly as much as 2-3  days ago.  I ordered NG tube 4 hour clamp trial, to be pulled by RN if he does well and then can start clear liquid diet.   Still very important that be mobilize as much as possible.   Would keep TNA going until he reliably is taking PO (could be another 1-3 days or so).   Owens Loffler, MD [facility] Gastroenterology Pager [phone]

## 2021-02-01 NOTE — Progress Notes (Signed)
PHARMACY - TOTAL PARENTERAL NUTRITION CONSULT NOTE   Indication: Prolonged ileus  Patient Measurements: Height: '5\' 8"'  (172.7 cm) Weight: 108.5 kg (239 lb 3.2 oz) IBW/kg (Calculated) : 68.4 TPN AdjBW (KG): 77.4 Body mass index is 36.37 kg/m. 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 Post op course complicated by Afib, aspiration PNA and prolonged ileus. Pharmacy consulted to manage TPN for nutrition support.  Glucose / Insulin: Hx DM2 on only metformin PTA, A1c 5.7.  Solucortef d/c 5/20  CBG range 118-173  Regular Insulin in TPN:  28 units since 5/25   Moderate SSI 8 units/24 hrs Electrolytes: K up to 4, goal >=4; Mg 2.2, goal >2; Cl elevated, CO2 27 (despite max acetate).  CoCa 10.1 WNL. Renal: SCr down slightly to 1.7 (contrast 5/24); BUN down to 87  Postop AKI w/ SCr peaking at 4 (baseline 1.4) , then was improving, but now increasing again.  Continues to have diffuse edema, anasarca.   Lasix BID (monitor elytes closely) Hepatic: AST and Alk Phos WNL, ALT increased 72; mild bump in tbili resolved; Trig 98 (5/10)> 123 (5/23) Preabumin 10 (5/19), 13.5 (5/23) I/O, mIVF:  IVF: NS @ 10, stopped 5/26, Amio gtt changed to PO 5/25  NGT in good position, keep at LIS  I/O charting inaccurate; UOP 4895m, NG output increased to 900 mL.  Stool x1  Relistor x2 (5/15, 5/17), Bisacodyl supp daily (5/21 > ).  Scheduled IV reglan (5/15-5/23) and miralax BID (5/18-5/21).  Docusate daily (5/24) GI Imaging:  - 5/18 CT A/P: ileus vs distal SBO -5/19 KUB: diffuse gaseous bowel distention persists -5/20 KUB: persistent & stable SBO -5/21 KUB: unchanged gaseous distention of colon & SB, contrast from 2 days ago in cecum - 5/24 CT: Diffusely distended small and large bowel in keeping with underlying ileus. Diffuse body wall subcutaneous edema, mild ascites in keeping with anasarca. GI Surgeries / Procedures:  5/26 EGD: esophageal candidiasis, very minimal  non-specific gastritis of doubtful clinical significance  Central access: CVC; PICC placed 5/19 for TPN TPN start date: 01/23/21  Nutritional Goals (RD recommendations 5/24):  Kcal:  1880-2090 kcal, Protein:  90-100 grams, Fluid:  >/= 2 L/day  Current TPN at goal rate of 80 mL/hr provides 106 g of protein, 1978 kcals per day  Keep K >/= 4 and Mg >/=2 with ileus  Current Nutrition:  TPN at goal rate Diet: CLD changed back to NPO 5/25 - Per GI on 5/27 may try clamping trial on 5/27, but will remain NPO today.    Plan:   Continue TPN @ 880mhr - provides 100% of goals  Electrolytes in TPN:  Na - 5069mL  K - 30 mEq/L  Ca - 5mE31m  Mg - 5 mEq/L  Phos - 5 mmol/L  Cl:Ac ratio: max Acetate  Add standard MVI and trace elements to TPN  Continue moderate scale SSI q4h and adjust as needed  Regular insulin, increase to 30 units in TPN  MIVF per MD, none currently  Monitor TPN labs on Mon/Thurs.   ChriGretta ArabrmD, BCPS Clinical Pharmacist [facility] main pharmacy [phone]/2022 7:59 AM

## 2021-02-01 NOTE — Progress Notes (Signed)
Subjective: 16 days s/p L TKA  Reports knee doing well. Overall feeling some better.  Objective: Vital signs in last 24 hours: Temp:  [97.9 F (36.6 C)-99.6 F (37.6 C)] 98.7 F (37.1 C) (05/27 0800) Pulse Rate:  [67-81] 81 (05/27 1200) Resp:  [17-34] 21 (05/27 1200) BP: (108-182)/(44-57) 182/57 (05/27 1200) SpO2:  [93 %-100 %] 100 % (05/27 1200)  Intake/Output from previous day: 05/26 0701 - 05/27 0700 In: 570 [I.V.:455.7; IV Piggyback:114.3] Out: 5700 [Urine:4800; Emesis/NG output:900] Intake/Output this shift: Total I/O In: 1109.6 [I.V.:1109.6] Out: 1150 [Urine:1150]  Recent Labs    01/30/21 1006 01/30/21 2203 01/31/21 0242 01/31/21 0511 02/01/21 0259  HGB 7.4* 8.0* 8.3* 8.5* 8.0*   Recent Labs    01/31/21 0511 02/01/21 0259  WBC 10.0 8.0  RBC 2.80* 2.71*  HCT 26.4* 25.5*  PLT 202 231   Recent Labs    01/31/21 0242 02/01/21 0259  NA 144 145  K 3.7 4.0  CL 113* 113*  CO2 22 27  BUN 98* 87*  CREATININE 1.81* 1.71*  GLUCOSE 118* 140*  CALCIUM 8.3* 8.5*   No results for input(s): LABPT, INR in the last 72 hours.  Neurologically intact Neurovascular intact Sensation intact distally Intact pulses distally Dorsiflexion/Plantar flexion intact Incision: dressing C/D/I and no drainage No cellulitis present Compartment soft no sign of DVT   Assessment/Plan: 1 Day Post-Op Procedure(s) (LRB): ESOPHAGOGASTRODUODENOSCOPY (EGD) WITH PROPOFOL (N/A) Up with therapy Up with PT when able otherwise  ROM at  Bedside 2x/day Eventual D/C to SNF when medically ready   Cecilie Kicks 02/01/2021, 2:01 PM

## 2021-02-01 NOTE — Progress Notes (Addendum)
PROGRESS NOTE  Mikko Lewellen VHQ:[id number] DOB: 06-20-1946 DOA: 01/16/2021 PCP: Bing Neighbors, NP  HPI/Recap of past 78 hours: 75 year old man with hx of SBO, hypertension, hyperlipidemia, GERD, type 2 diabetes, presenting for TKR complicated by ileus, LLL PNA, new onset perioperative A. fib with RVR, noted on 01/20/2021 and started on amiodarone drip and heparin drip in the ICU.  He had acute hypoxic respiratory failure and was transferred to the ICU on 01/20/2021 and was seen by PCCM.  TRH, hospitalist team was initially consulted for hypotension requiring vasopressors, acute kidney injury with a creatinine of 4 and constipation with work-up of ileus versus SBO.  NG tube placed on 01/20/2021.  NG tube removed on 01/28/2021.  On 01/25/2021 NGT output was noted to be coffee-ground for which GI was consulted.  Was also noted to have acute blood loss anemia.  On 01/26/2021 he was noted to have symptomatic anemia 2 unit PRBC were ordered to be transfused.  Heparin drip stopped.  Cardiology consulted.  Hemoglobin dropped on 01/28/2021 post 1 unit PRBC transfusion.  NG tube removed on 01/28/2021 and replaced on 01/30/2021 due to evidence of persistent ileus.  1 unit PRBC transfused for hemoglobin of 7.4 on 01/30/2021.  Plan for EGD on 01/31/2021.  02/01/21: Seen and examined Mr. Cancro at his bedside.  He reports intermittent cough.  NG tube in place with low intermittent suction, 900 cc recorded from NG tube output.   Assessment/Plan: Principal Problem:   Ileus ([place]) Active Problems:   Type 2 diabetes mellitus without complication, without long-term current use of insulin (HCC)   Essential hypertension   Obesity (BMI 30-39.9)   CAD (coronary artery disease)   Pulmonary HTN (HCC)   S/P TKR (total knee replacement) using cement   Acute respiratory distress   AKI (acute kidney injury) ([place])   Acute renal failure (HCC)   Benign prostatic hyperplasia   Obstructive sleep apnea treated with continuous  positive airway pressure (CPAP)   Hypotension   GERD (gastroesophageal reflux disease)   Protein-calorie malnutrition, moderate (HCC)   Gastrointestinal bleed   PAF (paroxysmal atrial fibrillation) (HCC)   Acute blood loss anemia   History of gastritis   Irritable bowel syndrome with intermittent diarrhea   SOB (shortness of breath)   Anasarca  Status post total left knee replacement 01/16/21 by Dr. [name] POD #16 Management per primary, orthopedic surgery. Out of bed to chair with every shift. Mobilize as tolerated Continue PT OT with assistance and fall precautions.  Persistent ileus, partial small bowel obstruction History of small bowel obstruction treated medically at outside facility in [place] in 2019. NG tube was removed on 01/28/2021, NG tube was reinserted on 01/30/2021 with evidence of persistent ileus. Continue to replete and optimize magnesium and potassium levels. Mobilize as tolerated. Appreciate general surgery and GI consultative services. Minimal bowel sounds appreciated, encouraged to mobilize as tolerated  Acute blood loss symptomatic anemia secondary to suspected upper GI bleed. History of gastritis. Gastric content sent on 01/25/2021 positive for occult blood. Drop of hemoglobin 6.8 on 01/26/2021, transfused 2 unit PRBCs. Repeat hemoglobin 7.9 on 01/27/2021. Drop of hemoglobin 7.1 on 01/28/2021.  Transfused 1 unit PRBCs.   Hemoglobin 7.4 on 01/30/2021, transfuse 1 unit PRBC. Received Protonix bolus on 01/26/2021.  Protonix drip stopped on 01/30/2021, IV Protonix 40 mg twice daily started. Hemoglobin 15.1 on 01/07/2021. Planned for EGD on 01/31/2021.  Appreciate GI's assistance. Hemoglobin 8.0 from 8.5.  Continue to monitor H&H.  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 Creatinine downtrending, 1.71 from 1.81 from 1.80 from 1.69 from 1.58. Continue to avoid nephrotoxic agents, dehydration and hypotension. Repeat chemistry panel in the  morning  Acute on chronic diastolic CHF Improving volume overload. Started on IV Lasix 40 mg twice daily on 01/30/2021 by cardiology with improvement of symptomatology, less work of breathing, less abdominal distention. Last 2D echo done on 01/21/2021 showed LVEF 50 to 55% Continue strict I's and O's and daily weight BNP 69 likely not accurate in the setting of obesity with BMI of 36. Net I&O +11.7 L>> 10.0 L>> +4.3 L.  Resolved hyperammonemia, suspect upper GI bleed Ammonia level 71>> 21 post lactulose. 1 dose of lactulose enema given on 01/30/2021.  Resolved acute toxic metabolic encephalopathy likely in the setting of hyperammonemia Hypersomnolent on 01/30/2021. He is alert and oriented x3 Avoid sedating agents, opiates and benzodiazepines.  Acute hypoxic hypercarbic respiratory failure secondary to recent left lower lobe pneumonia Not on oxygen supplementation at baseline Maintain O2 saturation greater than 90%.  Resolving mixed metabolic and respiratory acidosis Serum bicarb 21, pH 7.218, PCO2 48 on venous blood gas Give 2 A of bicarb on 01/30/2021 Bicarb also in TPN NG tube placed on 01/30/2021 which makes it difficult to use CPAP Serum bicarb 22 on 01/31/2021.  QTC prolongation. Twelve-lead EKG 01/28/2021 QTC 534. Minimize QTC prolonging agents as possible. DC'd Reglan. DC'd Zofran and replace with Tigan for nausea Protonix drip stopped, currently on IV Protonix 40 mg twice daily.  New onset perioperative A. fib with RVR, converted to sinus rhythm on 01/28/2021. CHA2DS2-VASc of 4 Was noted on 01/20/2021: Patient was transferred to the ICU Was started on amiodarone drip and heparin drip on 01/20/2021 in the ICU. Heparin drip has been DC'd on 01/26/2021 due to upper GI bleed. Amiodarone drip was DC'd on 01/30/2021 and switched to oral however NG tube has been replaced to suction. 2D echo was done on 01/21/2021 left atrium normal in size.  EF 50-55%. Twelve-lead EKG on 01/28/2021,  converted to sinus rhythm.  OSA on CPAP Avoid BiPAP/CPAP with NG tube in place.  Essential hypertension DC Solu-Cortef, stress dose steroids on 01/25/2021 IV Lopressor as needed with parameters. BP is currently stable. Avoid hypotension due to AKI. Continue to monitor vital signs  Resolved post repletion: Hypokalemia Potassium 3.4>> 3.9> 4.5> 3.7. Repleted with TPN  Prediabetes with hyperglycemia  Hemoglobin A1c 5.7 on 01/16/2021 Insulin sliding scale if needed Avoid hypoglycemia  Electrolyte derangement on TPN Hypokalemia, repleted with TPN Hyperglycemia, dose adjustment by pharmacy Hypophosphatemia, repleted with TPN Appreciate pharmacy's assistance  Elevated troponin Peaked at 23 Heparin drip DC'd on 01/26/2021 due to upper GI bleed. Denies any anginal symptoms at the time of this exam. Cardiology following  GERD Protonix 40 mg twice daily. Defer to GI.  Gout Resume home regimen when can take adequate oral   Code Status: Full code.  Family Communication: Updated his wife at bedside on 01/30/2021.  Disposition Plan: Defer to primary team.   Thank you for allowing [place] to participate in the care of this patient.  We will continue to follow along with you.     Objective: Vitals:   02/01/21 1000 02/01/21 1100 02/01/21 1200 02/01/21 1403  BP:   (!) 182/57   Pulse: 80 80 81   Resp: (!) 30 (!) 30 (!) 21   Temp:    97.9 F (36.6 C)  TempSrc:    Oral  SpO2: 95% 96% 100%  Weight:      Height:        Intake/Output Summary (Last 24 hours) at 02/01/2021 1405 Last data filed at 02/01/2021 1400 Gross per 24 hour  Intake 1479.55 ml  Output 6800 ml  Net -5320.45 ml   Filed Weights   01/29/21 1400 01/30/21 1027 01/31/21 0500  Weight: 112.2 kg 112.2 kg 108.5 kg    Exam:  . General: 75 y.o. year-old male pleasant, weak appearing in no acute stress.  He is alert and oriented x3. . Cardiovascular: Regular rate and rhythm no rubs or gallops. Marland Kitchen Respiratory: Mild  rales at bases no wheezing noted.  Good inspiratory effort.   . Abdomen: Obese hypoactive bowel sounds.  Nontender.   . Musculoskeletal: Surgical strips to right knee.  Trace edema in lower extremity bilaterally.   Marland Kitchen Psychiatry: Mood is appropriate for condition and setting.   Data Reviewed: CBC: Recent Labs  Lab 01/28/21 0422 01/28/21 1516 01/29/21 0914 01/29/21 1800 01/30/21 0247 01/30/21 1006 01/30/21 2203 01/31/21 0242 01/31/21 0511 02/01/21 0259  WBC 14.3*  --  11.2*  --  10.8*  --   --   --  10.0 8.0  NEUTROABS 11.6*  --   --   --   --   --   --   --   --   --   HGB 7.1*   < > 8.5*   < > 7.7*  7.7* 7.4* 8.0* 8.3* 8.5* 8.0*  HCT 22.5*   < > 27.1*   < > 24.9*  24.9* 23.7* 23.9* 25.7* 26.4* 25.5*  MCV 96.6  --  95.1  --  96.5  --   --   --  94.3 94.1  PLT 159  --  189  --  202  --   --   --  202 231   < > = values in this interval not displayed.   Basic Metabolic Panel: Recent Labs  Lab 01/28/21 0422 01/29/21 0402 01/30/21 0247 01/31/21 0242 02/01/21 0259  NA 144 142 141 144 145  K 4.5 4.8 4.9 3.7 4.0  CL 116* 115* 113* 113* 113*  CO2 23 20* 21* 22 27  GLUCOSE 177* 180* 164* 118* 140*  BUN 95* 86* 98* 98* 87*  CREATININE 1.58* 1.69* 1.80* 1.81* 1.71*  CALCIUM 8.5* 8.4* 8.3* 8.3* 8.5*  MG 2.2 2.2 2.2 2.3 2.2  PHOS 3.7 4.8* 4.2 3.9 3.8   GFR: Estimated Creatinine Clearance: 44.6 mL/min (A) (by C-G formula based on SCr of 1.71 mg/dL (H)). Liver Function Tests: Recent Labs  Lab 01/28/21 0422 01/31/21 0242  AST 41 29  ALT 49* 72*  ALKPHOS 46 55  BILITOT 0.8 1.2  PROT 5.0* 5.0*  ALBUMIN 2.3* 2.0*   No results for input(s): LIPASE, AMYLASE in the last 168 hours. Recent Labs  Lab 01/30/21 1106 01/31/21 0510  AMMONIA 71* 21   Coagulation Profile: No results for input(s): INR, PROTIME in the last 168 hours. Cardiac Enzymes: No results for input(s): CKTOTAL, CKMB, CKMBINDEX, TROPONINI in the last 168 hours. BNP (last 3 results) No results for  input(s): PROBNP in the last 8760 hours. HbA1C: No results for input(s): HGBA1C in the last 72 hours. CBG: Recent Labs  Lab 01/31/21 0737 01/31/21 1939 02/01/21 0012 02/01/21 0810 02/01/21 1148  GLUCAP 126* 162* 173* 140* 156*   Lipid Profile: No results for input(s): CHOL, HDL, LDLCALC, TRIG, CHOLHDL, LDLDIRECT in the last 72 hours. Thyroid Function Tests: No results for input(s): TSH, T4TOTAL,  FREET4, T3FREE, THYROIDAB in the last 72 hours. Anemia Panel: No results for input(s): VITAMINB12, FOLATE, FERRITIN, TIBC, IRON, RETICCTPCT in the last 72 hours. Urine analysis:    Component Value Date/Time   COLORURINE AMBER (A) 01/19/2021 1622   APPEARANCEUR CLOUDY (A) 01/19/2021 1622   LABSPEC 1.018 01/19/2021 1622   PHURINE 5.0 01/19/2021 1622   GLUCOSEU NEGATIVE 01/19/2021 1622   HGBUR MODERATE (A) 01/19/2021 1622   BILIRUBINUR NEGATIVE 01/19/2021 1622   KETONESUR NEGATIVE 01/19/2021 1622   PROTEINUR 100 (A) 01/19/2021 1622   NITRITE NEGATIVE 01/19/2021 1622   LEUKOCYTESUR NEGATIVE 01/19/2021 1622   Sepsis Labs: @LABRCNTIP (procalcitonin:4,lacticidven:4)  )No results found for this or any previous visit (from the past 240 hour(s)).    Studies: No results found.  Scheduled Meds: . (feeding supplement) PROSource Plus  30 mL Oral BID BM  . allopurinol  100 mg Oral QHS  . [START ON 02/02/2021] amiodarone  200 mg Oral Daily  . bisacodyl  10 mg Rectal Daily  . Chlorhexidine Gluconate Cloth  6 each Topical Daily  . docusate sodium  100 mg Oral Daily  . feeding supplement  1 Container Oral BID BM  . furosemide  40 mg Intravenous BID  . insulin aspart  0-15 Units Subcutaneous Q4H  . lip balm  1 application Topical BID  . mouth rinse  15 mL Mouth Rinse BID  . metoCLOPramide (REGLAN) injection  5 mg Intravenous Q8H  . metoprolol succinate  12.5 mg Oral Daily  . pantoprazole  40 mg Intravenous Q12H  . sodium chloride flush  10-40 mL Intracatheter Q12H    Continuous  Infusions: . sodium chloride Stopped (01/31/21 1007)  . fluconazole (DIFLUCAN) IV Stopped (02/01/21 1251)  . TPN ADULT (ION) 80 mL/hr at 02/01/21 0800  . TPN ADULT (ION)       LOS: 16 days     Kayleen Memos, MD [facility] Pager [phone]  If 7PM-7AM, please contact night-coverage [email or web address] Password [facility] 02/01/2021, 2:05 PM

## 2021-02-01 NOTE — Progress Notes (Signed)
Progress Note  1 Day Post-Op  Subjective: CC: afebrile, VSS. He reports a BM yesterday - loose and no melena. He denies emesis. He feels less bloated than yesterday and continues to have appetite - asking for ice water. He is passing flatus  Objective: Vital signs in last 24 hours: Temp:  [97.4 F (36.3 C)-99.6 F (37.6 C)] 98.5 F (36.9 C) (05/27 0400) Pulse Rate:  [67-88] 80 (05/27 0600) Resp:  [15-33] 26 (05/27 0600) BP: (79-183)/(18-59) 151/51 (05/27 0400) SpO2:  [93 %-100 %] 96 % (05/27 0600) Last BM Date: 01/30/21  Intake/Output from previous day: 05/26 0701 - 05/27 0700 In: 570 [I.V.:455.7; IV Piggyback:114.3] Out: 5700 [Urine:4800; Emesis/NG output:900] Intake/Output this shift: No intake/output data recorded.  PE: General: pleasant, ill appearing male who is sitting up in bed in NAD HEENT: head is normocephalic, atraumatic. NGT in place Heart: regular, rate, and rhythm. Palpable radial pulses bilaterally Lungs: CTAB. Respiratory effort nonlabored. Abd: very distended, soft. Minimally tender to palpation in RUQ and LUQ without guarding or rebound. +BS in all 4 quadrants. NGT output dark bilious MS: bilateral upper and lower extremity edema improved. Left knee with incision - steri strips in place without erythema or discharge Skin: warm and dry  Psych: A&Ox3 with an appropriate affect.    Lab Results:  Recent Labs    01/31/21 0511 02/01/21 0259  WBC 10.0 8.0  HGB 8.5* 8.0*  HCT 26.4* 25.5*  PLT 202 231   BMET Recent Labs    01/31/21 0242 02/01/21 0259  NA 144 145  K 3.7 4.0  CL 113* 113*  CO2 22 27  GLUCOSE 118* 140*  BUN 98* 87*  CREATININE 1.81* 1.71*  CALCIUM 8.3* 8.5*   PT/INR No results for input(s): LABPROT, INR in the last 72 hours. CMP     Component Value Date/Time   NA 145 02/01/2021 0259   NA 141 10/06/2016 0830   K 4.0 02/01/2021 0259   CL 113 (H) 02/01/2021 0259   CO2 27 02/01/2021 0259   GLUCOSE 140 (H) 02/01/2021 0259    BUN 87 (H) 02/01/2021 0259   BUN 29 (H) 10/06/2016 0830   CREATININE 1.71 (H) 02/01/2021 0259   CALCIUM 8.5 (L) 02/01/2021 0259   PROT 5.0 (L) 01/31/2021 0242   PROT 7.4 09/22/2016 0913   ALBUMIN 2.0 (L) 01/31/2021 0242   ALBUMIN 4.7 09/22/2016 0913   AST 29 01/31/2021 0242   ALT 72 (H) 01/31/2021 0242   ALKPHOS 55 01/31/2021 0242   BILITOT 1.2 01/31/2021 0242   BILITOT 1.0 09/22/2016 0913   GFRNONAA 41 (L) 02/01/2021 0259   GFRAA 59 (L) 04/29/2018 1012   Lipase  No results found for: LIPASE     Studies/Results: DG Abd 1 View  Result Date: 01/30/2021 CLINICAL DATA:  NG tube placement. EXAM: ABDOMEN - 1 VIEW COMPARISON:  None. FINDINGS: NG tube is in place with the tip in the fundus of the stomach. IMPRESSION: NG tube projects in good position. Electronically Signed   By: Inge Rise M.D.   On: 01/30/2021 13:35    Anti-infectives: Anti-infectives (From admission, onward)   Start     Dose/Rate Route Frequency Ordered Stop   01/31/21 1200  fluconazole (DIFLUCAN) IVPB 100 mg        100 mg 50 mL/hr over 60 Minutes Intravenous Every 24 hours 01/31/21 1113 02/10/21 1159   01/20/21 1830  vancomycin (VANCOREADY) IVPB 2000 mg/400 mL        2,000 mg 200  mL/hr over 120 Minutes Intravenous  Once 01/20/21 1738 01/20/21 2005   01/20/21 1737  vancomycin variable dose per unstable renal function (pharmacist dosing)  Status:  Discontinued         Does not apply See admin instructions 01/20/21 1738 01/22/21 0729   01/20/21 1400  cefTRIAXone (ROCEPHIN) 2 g in sodium chloride 0.9 % 100 mL IVPB        2 g 200 mL/hr over 30 Minutes Intravenous Every 24 hours 01/20/21 1347 01/25/21 1359   01/16/21 2000  vancomycin (VANCOREADY) IVPB 1250 mg/250 mL       Note to Pharmacy: Per pharmacy   1,250 mg 166.7 mL/hr over 90 Minutes Intravenous Every 12 hours 01/16/21 1329 01/16/21 2155   01/16/21 0600  vancomycin (VANCOREADY) IVPB 1500 mg/300 mL        1,500 mg 150 mL/hr over 120 Minutes  Intravenous On call to O.R. 01/15/21 0839 01/16/21 1000   01/09/21 0600  vancomycin (VANCOREADY) IVPB 1500 mg/300 mL        1,500 mg 150 mL/hr over 120 Minutes Intravenous On call to O.R. 01/08/21 0806 01/10/21 0559   01/09/21 0600  gentamicin (GARAMYCIN) 410 mg in dextrose 5 % 100 mL IVPB        5 mg/kg  82.8 kg (Adjusted) 220.5 mL/hr over 30 Minutes Intravenous On call to O.R. 01/08/21 0813 01/10/21 0559       Assessment/Plan   Ileus s/p orthopedic surgery - CT 5/25 with distension of stomach, small and large bowel - ileus similar to prior CT (5/21). Oral contrast in rectum from prior CT - EGD 5/26 - findings consistent with candida infection and minimal inflammation/gastritis - NGT out 5/23. Replaced at time of EGD 5/26 - consider clamp trial today - reglan per GI 5/26 - mobilize as tolerated to help recovery  - continued TPN - getting daily suppository. PO colace added 5/24  Concern for upper GI bleeding gastritis.   - Hgb 8.0 (8.5) stable s/p 1 unit PRBCs 5/25 - EGD 5/26 - findings consistent with candida infection and minimal inflammation/gastritis - continuous PPIs  FEN: NGT and ice chips, TPN VTE prophylaxis: SCDs    LOS: 16 days    Winferd Humphrey, [facility] 02/01/2021, 7:49 AM Please see Amion for pager number during day hours 7:00am-4:30pm

## 2021-02-01 NOTE — Progress Notes (Signed)
Progress Note  Patient Name: [name] Date of Encounter: 02/01/2021  [facility] Cardiologist: Ida Rogue, MD   Subjective   Reports dyspnea improved  Inpatient Medications    Scheduled Meds: . (feeding supplement) PROSource Plus  30 mL Oral BID BM  . allopurinol  100 mg Oral QHS  . amiodarone  200 mg Oral BID  . bisacodyl  10 mg Rectal Daily  . Chlorhexidine Gluconate Cloth  6 each Topical Daily  . docusate sodium  100 mg Oral Daily  . feeding supplement  1 Container Oral BID BM  . furosemide  40 mg Intravenous BID  . insulin aspart  0-15 Units Subcutaneous Q4H  . lip balm  1 application Topical BID  . mouth rinse  15 mL Mouth Rinse BID  . metoCLOPramide (REGLAN) injection  5 mg Intravenous Q8H  . metoprolol succinate  12.5 mg Oral Daily  . pantoprazole  40 mg Intravenous Q12H  . sodium chloride flush  10-40 mL Intracatheter Q12H   Continuous Infusions: . sodium chloride Stopped (01/31/21 1007)  . fluconazole (DIFLUCAN) IV Stopped (02/01/21 1251)  . TPN ADULT (ION) 80 mL/hr at 02/01/21 0800  . TPN ADULT (ION)     PRN Meds: acetaminophen, alum & mag hydroxide-simeth, diphenhydrAMINE, HYDROmorphone (DILAUDID) injection, magic mouthwash, menthol-cetylpyridinium, metoprolol tartrate, naphazoline-glycerin, phenol, sodium chloride, sodium chloride flush, trimethobenzamide   Vital Signs    Vitals:   02/01/21 0949 02/01/21 1000 02/01/21 1100 02/01/21 1200  BP:    (!) 182/57  Pulse: 80 80 80 81  Resp:  (!) 30 (!) 30 (!) 21  Temp:      TempSrc:      SpO2:  95% 96% 100%  Weight:      Height:        Intake/Output Summary (Last 24 hours) at 02/01/2021 1318 Last data filed at 02/01/2021 1146 Gross per 24 hour  Intake 1479.55 ml  Output 6200 ml  Net -4720.45 ml   Last 3 Weights 01/31/2021 01/30/2021 01/29/2021  Weight (lbs) 239 lb 3.2 oz 247 lb 5.7 oz 247 lb 5.7 oz  Weight (kg) 108.5 kg 112.2 kg 112.2 kg      Telemetry    Sinus rhythm with rates 70-80s-  Personally Reviewed  ECG    Sinus rhythm, rate 92 bpm, RBBB, PACs, no STE/D, no significant change from previous  - Personally Reviewed  Physical Exam   GEN:  In no acute distress Neck: No JVD Cardiac: RRR, no murmurs, rubs, or gallops.  Respiratory: CTAB GI: nontender, mildly distended  MS: 2+ LE edema L>R s/p L TKR with incision site dressing in place C/D/I Neuro:  Nonfocal  Psych: Normal affect   Labs    High Sensitivity Troponin:   Recent Labs  Lab 01/20/21 1220 01/20/21 2131  TROPONINIHS 21* 23*      Chemistry Recent Labs  Lab 01/28/21 0422 01/29/21 0402 01/30/21 0247 01/31/21 0242 02/01/21 0259  NA 144   < > 141 144 145  K 4.5   < > 4.9 3.7 4.0  CL 116*   < > 113* 113* 113*  CO2 23   < > 21* 22 27  GLUCOSE 177*   < > 164* 118* 140*  BUN 95*   < > 98* 98* 87*  CREATININE 1.58*   < > 1.80* 1.81* 1.71*  CALCIUM 8.5*   < > 8.3* 8.3* 8.5*  PROT 5.0*  --   --  5.0*  --   ALBUMIN 2.3*  --   --  2.0*  --   AST 41  --   --  29  --   ALT 49*  --   --  72*  --   ALKPHOS 46  --   --  55  --   BILITOT 0.8  --   --  1.2  --   GFRNONAA 45*   < > 39* 39* 41*  ANIONGAP 5   < > 7 9 5    < > = values in this interval not displayed.     Hematology Recent Labs  Lab 01/30/21 0247 01/30/21 1006 01/31/21 0242 01/31/21 0511 02/01/21 0259  WBC 10.8*  --   --  10.0 8.0  RBC 2.58*  --   --  2.80* 2.71*  HGB 7.7*  7.7*   < > 8.3* 8.5* 8.0*  HCT 24.9*  24.9*   < > 25.7* 26.4* 25.5*  MCV 96.5  --   --  94.3 94.1  MCH 29.8  --   --  30.4 29.5  MCHC 30.9  --   --  32.2 31.4  RDW 18.7*  --   --  18.0* 17.7*  PLT 202  --   --  202 231   < > = values in this interval not displayed.    BNP Recent Labs  Lab 01/29/21 0402  BNP 69.1     DDimer No results for input(s): DDIMER in the last 168 hours.   Radiology    No results found.  Cardiac Studies   TTE 01/21/2021 1. Poor acoustic windows limit study even with Definity. Overall LVEF  appears normal with no  signficant wall motion abnormalities . Left  ventricular ejection fraction, by estimation, is 50 to 55%. The left  ventricle has low normal function. There is  mild left ventricular hypertrophy.  2. Right ventricular systolic function is moderately reduced. The right  ventricular size is mildly enlarged. There is moderately elevated  pulmonary artery systolic pressure.  3. Trivial mitral valve regurgitation.  4. The aortic valve was not well visualized. Aortic valve regurgitation  is not visualized. Mild to moderate aortic valve sclerosis/calcification  is present, without any evidence of aortic stenosis.  5. The inferior vena cava is dilated in size with <50% respiratory  variability, suggesting right atrial pressure of 15 mmHg.   R/LHC 2018:  Dist LAD lesion, 20 %stenosed.  The left ventricular ejection fraction is 55-65% by visual estimate. Assessment: 1. Minimal coronary plaque 2. Normal LV function EF 55-60% 3. Very mildly elevated pulmonary pressures (much lower than echo) 4. 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 Will get CT chest to further evaluate anomalous pulmonary vein. Given Qp/Qs < 1.5 and lack of RV strain will likely continue to tolerate well.   Echocardiogram 2018: Study Conclusions   - Left ventricle: The cavity size was normal. Wall thickness was  normal. Systolic function was normal. The estimated ejection  fraction was in the range of 50% to 55%. Wall motion was normal;  there were no regional wall motion abnormalities. Doppler  parameters are consistent with abnormal left ventricular  relaxation (grade 1 diastolic dysfunction).  - Ventricular septum: The contour showed systolic flattening. These  changes are consistent with RV pressure overload.  - Left atrium: The atrium was mildly dilated.  - Right ventricle: The cavity size was moderately dilated. Wall  thickness was  mildly increased. Systolic function was mildly  reduced.  - Pulmonary arteries: Systolic  pressure was moderately to severely  increased. PA peak pressure: 60 mm Hg (S).  - Inferior vena cava: The vessel was dilated. The respirophasic  diameter changes were blunted (< 50%), consistent with elevated  central venous pressure.   Patient Profile     Johsua Shevlin is a 75 y.o. 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  Course has been complicated on 2/44/0102 by dehydration and hypotension.  He subsequently developed an ileus and SBO.  He developed respiratory failure on 01/20/2021 due to aspiration.  VQ scan negative for PE.  He developed A. fib with RVR at that time.  Cardiology was consulted on 01/26/2021 for A. fib management in the setting of GI bleed and acute blood loss anemia.  Assessment & Plan    1. Paroxysmal atrial fibrillation: patient found to have atrial fibrillation with RVR this admission in the setting of GIB, acute blood loss anemia, SBO, and aspiration PNA. Echo showed EF 50-55%, no RWMA, and no significant valvular abnormalities. He was NPO and started on amiodarone gtt with conversion to sinus rhythm. Now tolerating CLD. Anticoagulation deferred in the setting of GIB despite CHA2DS2-VASc Score = 5 [CHF History: No, HTN History: Yes, Diabetes History: Yes, Stroke History: No, Vascular Disease History: Yes, Age Score: 2, Gender Score: 0], giving patient an annual risk of stroke 7.2 %. Hgb trickling back down to 7.1 today. He is maintaining sinus rhythm at this time - Continue amiodarone PO 200 mg daily.  Would plan to continue for 1 month as he recovers from acute illness, can then stop - Recommend starting eliquis 5mg  BID once Hgb stabilizes and he is cleared to start anticoagulation by  GI  2. HTN: BP intermittently elevated. Home metoprolol succinate AND tartrate, lisinopril, spironolactone, HCTZ, lasix, and doxazosin on hold.   3. 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 RV was not dilated at that time, though now is dilated with dilated IVC on echo this admission. Felt this would not be an issue for him on Dr. Kathalene Frames assessment.  - Consider repeat surveillance echo outpatient to re-evaluate RV function in the future  4. Non-obstructive CAD: minimal LAD stenosis noted on LHC in 2018. No anginal complaints. HsTrop elevated to 23 this admission in the setting of Afib with RVR, GIB, SOB, and aspiration PNA. Not c/w ACS - Resume statin when tolerating po  5. Prolonged QTc: Continue to limit QT prolonging medications  6.  Acute diastolic heart failure: suspect volume overload is multifactorial in the setting of diastolic heart failure, poor nutritional status (albumin 2.3), and volume resuscitation efforts this admission. He has 2-3+ LE edema L>R s/p L TKR. He has received 3 u PRBCs - Appears volume overloaded on exam and worsening renal function could be cardiorenal.  Checked CVP from PICC 5/25, was 16.  Started IV Lasix 40 mg twice daily.  Renal function improving with diuresis, would continue IV Lasix 40 mg twice daily   Remainder of care per primary team: - GIB with ongoing anemia requiring transfusion - SBO - Aspiration PNA - S/p TKR      For questions or updates, please contact [facility] Please consult [email or web address] for contact info under        Signed, Donato Heinz, MD  02/01/2021, 1:18 PM

## 2021-02-01 NOTE — Progress Notes (Signed)
Physical Therapy Treatment Patient Details Name: [name] MRN: [id number] DOB: 05/23/1946 Today's Date: 02/01/2021    History of Present Illness Pt is a 75 year old male s/p left TKA on 01/16/21. Transferred to ICU on 5/15 due to respiratory failure, ileus, kidney failure    PT Comments    POD # 16 pt seen in ICU This is the best I have seen him.  Pt fully awake and feeling "better" wants coffee.  Spouse in room.  RN assisted with mobility.  Clamped NG. General bed mobility comments: required increased time and Mood Assist esp with upper body and assist to guide L LE off bed.  Sat EOB x 4 min before attempt stand.  Pt able to static sit with Supervision. General transfer comment: pt required + 2 side by side assist to rise from elevated bed onto B Platform EVA walker. General Gait Details: pt tolerated an increased distance using B platform EVA walker for increased supposrt and due to ectded LOS.  VERY weak.  Pt was able to amb 15 feet as spouse followed with recliner.  One seated rest break, then amb another 15 feet before c/o fatigue. Returned to room and performed some TKR TE's. Today pt present with 75 degrees knee flex.    Follow Up Recommendations  CIR (discussed with LPT pt would benefit from a more aggressive Rehab)     Equipment Recommendations  Rolling walker with 5" wheels    Recommendations for Other Services       Precautions / Restrictions Precautions Precautions: Fall;Knee Precaution Comments: monitor vitals Restrictions Weight Bearing Restrictions: No LLE Weight Bearing: Weight bearing as tolerated    Mobility  Bed Mobility Overal bed mobility: Needs Assistance       Supine to sit: Mod assist;+2 for safety/equipment;HOB elevated     General bed mobility comments: required increased time and Mood Assist esp with upper body and assist to guide L LE off bed.  Sat EOB x 4 min before attempt stand.  Pt able to static sit with Supervision.     Transfers Overall transfer level: Needs assistance Equipment used: Rolling walker (2 wheeled) Transfers: Sit to/from Stand Sit to Stand: Mod assist;From elevated surface;+2 physical assistance         General transfer comment: pt required + 2 side by side assist to rise from elevated bed onto B Platform EVA walker.  Ambulation/Gait Ambulation/Gait assistance: Mod assist;Max assist Gait Distance (Feet): 30 Feet (15 feet x 2 one seated rest break) Assistive device: Bilateral platform walker (EVA walker) Gait Pattern/deviations: Step-to pattern;Decreased stance time - left;Trunk flexed;Decreased weight shift to left;Decreased stride length Gait velocity: decreased   General Gait Details: pt tolerated an increased distance using B platform EVA walker for increased supposrt and due to ectded LOS.  VERY weak.  Pt was able to amb 15 feet as spouse followed with recliner.  One seated rest break, then amb another 15 feet.   Stairs             Wheelchair Mobility    Modified Rankin (Stroke Patients Only)       Balance                                            Cognition Arousal/Alertness: Awake/alert Behavior During Therapy: WFL for tasks assessed/performed Overall Cognitive Status: Within Functional Limits for tasks assessed  General Comments: AxO x 3 feeling "better"      Exercises      General Comments        Pertinent Vitals/Pain Pain Assessment: Faces Faces Pain Scale: Hurts a little bit Pain Location: L knee Pain Descriptors / Indicators: Tightness;Sore Pain Intervention(s): Monitored during session;Repositioned    Home Living                      Prior Function            PT Goals (current goals can now be found in the care plan section) Progress towards PT goals: Progressing toward goals    Frequency    Min 3X/week      PT Plan Frequency needs to be updated     Co-evaluation              AM-PAC PT "6 Clicks" Mobility   Outcome Measure  Help needed turning from your back to your side while in a flat bed without using bedrails?: A Lot Help needed moving from lying on your back to sitting on the side of a flat bed without using bedrails?: A Lot Help needed moving to and from a bed to a chair (including a wheelchair)?: A Lot Help needed standing up from a chair using your arms (e.g., wheelchair or bedside chair)?: A Lot Help needed to walk in hospital room?: A Lot Help needed climbing 3-5 steps with a railing? : Total 6 Click Score: 11    End of Session Equipment Utilized During Treatment: Gait belt Activity Tolerance: Patient limited by fatigue Patient left: in chair;with call bell/phone within reach Nurse Communication: Mobility status PT Visit Diagnosis: Other abnormalities of gait and mobility (R26.89)     Time: 1610-9604 PT Time Calculation (min) (ACUTE ONLY): 44 min  Charges:  $Gait Training: 8-22 mins $Therapeutic Exercise: 8-22 mins $Therapeutic Activity: 8-22 mins                     Rica Koyanagi  PTA Acute  Rehabilitation Services Pager      [phone] Office      [phone]

## 2021-02-01 NOTE — Progress Notes (Signed)
Inpatient Rehab Admissions Coordinator Note:   Per therapy recommendations, pt was screened for CIR candidacy by Calina Patrie, MS CCC-SLP. At this time, Pt. Appears to have functional decline and is a good candidate for CIR. Will request order for rehab consult per protocol.  Please contact me with questions.   Kemonie Cutillo, MS, CCC-SLP Rehab Admissions Coordinator  [phone] (celll) [phone] (office)  

## 2021-02-02 DIAGNOSIS — R0603 Acute respiratory distress: Secondary | ICD-10-CM | POA: Diagnosis not present

## 2021-02-02 DIAGNOSIS — I5032 Chronic diastolic (congestive) heart failure: Secondary | ICD-10-CM | POA: Diagnosis not present

## 2021-02-02 DIAGNOSIS — K567 Ileus, unspecified: Secondary | ICD-10-CM | POA: Diagnosis not present

## 2021-02-02 DIAGNOSIS — I480 Paroxysmal atrial fibrillation: Secondary | ICD-10-CM | POA: Diagnosis not present

## 2021-02-02 LAB — GLUCOSE, CAPILLARY
Glucose-Capillary: 135 mg/dL — ABNORMAL HIGH (ref 70–99)
Glucose-Capillary: 134 mg/dL — ABNORMAL HIGH (ref 70–99)
Glucose-Capillary: 149 mg/dL — ABNORMAL HIGH (ref 70–99)
Glucose-Capillary: 153 mg/dL — ABNORMAL HIGH (ref 70–99)
Glucose-Capillary: 157 mg/dL — ABNORMAL HIGH (ref 70–99)
Glucose-Capillary: 149 mg/dL — ABNORMAL HIGH (ref 70–99)
Glucose-Capillary: 162 mg/dL — ABNORMAL HIGH (ref 70–99)
Glucose-Capillary: 147 mg/dL — ABNORMAL HIGH (ref 70–99)

## 2021-02-02 LAB — COMPREHENSIVE METABOLIC PANEL
ALT: 55 U/L — ABNORMAL HIGH (ref 0–44)
AST: 29 U/L (ref 15–41)
Albumin: 2.2 g/dL — ABNORMAL LOW (ref 3.5–5.0)
Alkaline Phosphatase: 66 U/L (ref 38–126)
Anion gap: 9 (ref 5–15)
BUN: 69 mg/dL — ABNORMAL HIGH (ref 8–23)
CO2: 29 mmol/L (ref 22–32)
Calcium: 8.6 mg/dL — ABNORMAL LOW (ref 8.9–10.3)
Chloride: 104 mmol/L (ref 98–111)
Creatinine, Ser: 1.51 mg/dL — ABNORMAL HIGH (ref 0.61–1.24)
GFR, Estimated: 48 mL/min — ABNORMAL LOW (ref >60)
Glucose, Bld: 132 mg/dL — ABNORMAL HIGH (ref 70–99)
Potassium: 3.6 mmol/L (ref 3.5–5.1)
Sodium: 142 mmol/L (ref 135–145)
Total Bilirubin: 0.9 mg/dL (ref 0.3–1.2)
Total Protein: 5.5 g/dL — ABNORMAL LOW (ref 6.5–8.1)

## 2021-02-02 LAB — CBC
HCT: 25.9 % — ABNORMAL LOW (ref 39.0–52.0)
Hemoglobin: 8.1 g/dL — ABNORMAL LOW (ref 13.0–17.0)
MCH: 29.7 pg (ref 26.0–34.0)
MCHC: 31.3 g/dL (ref 30.0–36.0)
MCV: 94.9 fL (ref 80.0–100.0)
Platelets: 263 10*3/uL (ref 150–400)
RBC: 2.73 MIL/uL — ABNORMAL LOW (ref 4.22–5.81)
RDW: 17.2 % — ABNORMAL HIGH (ref 11.5–15.5)
WBC: 7.4 10*3/uL (ref 4.0–10.5)
nRBC: 0.3 % — ABNORMAL HIGH (ref 0.0–0.2)

## 2021-02-02 LAB — PHOSPHORUS: Phosphorus: 4 mg/dL (ref 2.5–4.6)

## 2021-02-02 LAB — MAGNESIUM: Magnesium: 2 mg/dL (ref 1.7–2.4)

## 2021-02-02 MED ORDER — TRAVASOL 10 % IV SOLN
INTRAVENOUS | Status: AC
  Filled 2021-02-02: qty 1056

## 2021-02-02 MED ORDER — METOPROLOL SUCCINATE ER 25 MG PO TB24
25.0000 mg | ORAL_TABLET | Freq: Two times a day (BID) | ORAL | Status: DC
  Administered 2021-02-02 – 2021-02-06 (×8): 25 mg via ORAL
  Filled 2021-02-02 (×8): qty 1

## 2021-02-02 MED ORDER — POTASSIUM CHLORIDE 10 MEQ/50ML IV SOLN
10.0000 meq | INTRAVENOUS | Status: AC
  Administered 2021-02-02 (×4): 10 meq via INTRAVENOUS
  Filled 2021-02-02 (×4): qty 50

## 2021-02-02 NOTE — Progress Notes (Signed)
Progress Note  Patient Name: [name] Date of Encounter: 02/02/2021  [facility] Cardiologist: Ida Rogue, MD   Patient Profile     Nilan Iddings is a 75 y.o. 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  Course has been complicated on 01/09/5464 by dehydration and hypotension.  He subsequently developed an ileus and SBO and respiratory failure on 01/20/2021 due to aspiration.  VQ scan negative for PE.  He developed A. fib with RVR at that time.  Cardiology was consulted on 01/26/2021 for A. fib management in the setting of GI bleed and acute blood loss anemia.  Treated with amiodarone with subsequent reversion to sinus rhythm.  Anticoagulation deferred Also congestive heart failure with elevated right-sided pressures and elevated creatinine.  IV Lasix helped both  Subjective   Feels better but sob with minimal effort  No pain   Inpatient Medications    Scheduled Meds: . (feeding supplement) PROSource Plus  30 mL Oral BID BM  . allopurinol  100 mg Oral QHS  . amiodarone  200 mg Oral Daily  . bisacodyl  10 mg Rectal Daily  . Chlorhexidine Gluconate Cloth  6 each Topical Daily  . docusate sodium  100 mg Oral Daily  . feeding supplement  1 Container Oral BID BM  . furosemide  40 mg Intravenous BID  . insulin aspart  0-15 Units Subcutaneous Q4H  . lip balm  1 application Topical BID  . mouth rinse  15 mL Mouth Rinse BID  . metoCLOPramide (REGLAN) injection  5 mg Intravenous Q8H  . metoprolol succinate  12.5 mg Oral Daily  . pantoprazole  40 mg Intravenous Q12H  . sodium chloride flush  10-40 mL Intracatheter Q12H   Continuous Infusions: . sodium chloride 10 mL/hr at 02/02/21 0600  . fluconazole (DIFLUCAN) IV Stopped (02/01/21 1251)  . potassium chloride 10 mEq (02/02/21 0949)  . TPN  ADULT (ION) 80 mL/hr at 02/02/21 0600  . TPN ADULT (ION)     PRN Meds: acetaminophen, alum & mag hydroxide-simeth, diphenhydrAMINE, HYDROmorphone (DILAUDID) injection, magic mouthwash, menthol-cetylpyridinium, metoprolol tartrate, naphazoline-glycerin, phenol, sodium chloride, sodium chloride flush, trimethobenzamide   Vital Signs    Vitals:   02/02/21 0500 02/02/21 0600 02/02/21 0738 02/02/21 0800  BP:   (!) 153/55 (!) 158/64  Pulse: 73 70 69 74  Resp: (!) 21 13 15  (!) 22  Temp:   98.7 F (37.1 C)   TempSrc:   Oral   SpO2: 94% 97% 98% 93%  Weight:      Height:        Intake/Output Summary (Last 24 hours) at 02/02/2021 1052 Last data filed at 02/02/2021 0830 Gross per 24 hour  Intake 2659.17 ml  Output 5800 ml  Net -3140.83 ml   Last 3 Weights 02/02/2021 01/31/2021 01/30/2021  Weight (lbs) 221 lb 5.5 oz 239 lb 3.2 oz 247 lb 5.7 oz  Weight (kg) 100.4 kg 108.5 kg 112.2 kg      Telemetry    Telemetry Personally reviewed holding sinus   ECG       Physical Exam   Well developed and nourished in no acute distress HENT normal Neck supple with JVP-  9 Clear Regular rate and rhythm, no murmurs or gallops Abd-soft with active BS No Clubbing cyanosis 1+ edema Skin-warm and dry A & Oriented  Grossly normal sensory  and motor function   Labs    High Sensitivity Troponin:   Recent Labs  Lab 01/20/21 1220 01/20/21 2131  TROPONINIHS 21* 23*      Chemistry Recent Labs  Lab 01/28/21 0422 01/29/21 0402 01/31/21 0242 02/01/21 0259 02/02/21 0359  NA 144   < > 144 145 142  K 4.5   < > 3.7 4.0 3.6  CL 116*   < > 113* 113* 104  CO2 23   < > 22 27 29   GLUCOSE 177*   < > 118* 140* 132*  BUN 95*   < > 98* 87* 69*  CREATININE 1.58*   < > 1.81* 1.71* 1.51*  CALCIUM 8.5*   < > 8.3* 8.5* 8.6*  PROT 5.0*  --  5.0*  --  5.5*  ALBUMIN 2.3*  --  2.0*  --  2.2*  AST 41  --  29  --  29  ALT 49*  --  72*  --  55*  ALKPHOS 46  --  55  --  66  BILITOT 0.8  --  1.2  --  0.9   GFRNONAA 45*   < > 39* 41* 48*  ANIONGAP 5   < > 9 5 9    < > = values in this interval not displayed.     Hematology Recent Labs  Lab 01/31/21 0511 02/01/21 0259 02/02/21 0359  WBC 10.0 8.0 7.4  RBC 2.80* 2.71* 2.73*  HGB 8.5* 8.0* 8.1*  HCT 26.4* 25.5* 25.9*  MCV 94.3 94.1 94.9  MCH 30.4 29.5 29.7  MCHC 32.2 31.4 31.3  RDW 18.0* 17.7* 17.2*  PLT 202 231 263    BNP Recent Labs  Lab 01/29/21 0402  BNP 69.1     DDimer No results for input(s): DDIMER in the last 168 hours.   Radiology    No results found.  Cardiac Studies   TTE 01/21/2021 1. Poor acoustic windows limit study even with Definity. Overall LVEF  appears normal with no signficant wall motion abnormalities . Left  ventricular ejection fraction, by estimation, is 50 to 55%. The left  ventricle has low normal function. There is  mild left ventricular hypertrophy.  2. Right ventricular systolic function is moderately reduced. The right  ventricular size is mildly enlarged. There is moderately elevated  pulmonary artery systolic pressure.  3. Trivial mitral valve regurgitation.  4. The aortic valve was not well visualized. Aortic valve regurgitation  is not visualized. Mild to moderate aortic valve sclerosis/calcification  is present, without any evidence of aortic stenosis.  5. The inferior vena cava is dilated in size with <50% respiratory  variability, suggesting right atrial pressure of 15 mmHg.   R/LHC 2018:  Dist LAD lesion, 20 %stenosed.  The left ventricular ejection fraction is 55-65% by visual estimate. Assessment: 1. Minimal coronary plaque 2. Normal LV function EF 55-60% 3. Very mildly elevated pulmonary pressures (much lower than echo) 4. 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 Will get CT chest to further evaluate anomalous pulmonary vein. Given Qp/Qs < 1.5 and lack of RV strain will likely continue to tolerate  well.   Echocardiogram 2018: Study Conclusions   - Left ventricle: The cavity size was normal. Wall thickness was  normal. Systolic function was normal. The estimated ejection  fraction was in the range of 50% to 55%. Wall motion was normal;  there were no regional wall motion abnormalities. Doppler  parameters are  consistent with abnormal left ventricular  relaxation (grade 1 diastolic dysfunction).  - Ventricular septum: The contour showed systolic flattening. These  changes are consistent with RV pressure overload.  - Left atrium: The atrium was mildly dilated.  - Right ventricle: The cavity size was moderately dilated. Wall  thickness was mildly increased. Systolic function was mildly  reduced.  - Pulmonary arteries: Systolic pressure was moderately to severely  increased. PA peak pressure: 60 mm Hg (S).  - Inferior vena cava: The vessel was dilated. The respirophasic  diameter changes were blunted (< 50%), consistent with elevated  central venous pressure.     Assessment & Plan      Atrial fibrillation-paroxysmal treated with amiodarone and anticoagulation deferred secondary to GI bleeding  Congestive heart failure acute/chronic/diastolic  Hypertension and hypertensive heart disease  Status post total knee complicated by respiratory failure/SBO/GI bleeding  Partial anomalous venous return to the right atrium not thought to be functionally important  Renal insufficiency grade 3-improving  Hypertension   Holding sinus continue amiodarone Agree with no anticoagulation Still volume overloaded continue IV diuretics  Wt 109>>100 and IO look negative but 3L + today?    With hypertension will increase meto 12.5 daily >> 25 bid      For questions or updates, please contact [place] Please consult [email or web address] for contact info under        Signed, Virl Axe, MD  02/02/2021, 10:52 AM

## 2021-02-02 NOTE — Progress Notes (Signed)
PROGRESS NOTE  Raman Featherston XBJ:[id number] DOB: 10/16/1945 DOA: 01/16/2021 PCP: Bing Neighbors, NP  HPI/Recap of past 108 hours: 75 year old man with hx of SBO, hypertension, hyperlipidemia, GERD, type 2 diabetes, presenting for TKR complicated by ileus, LLL PNA, new onset perioperative A. fib with RVR, noted on 01/20/2021 and started on amiodarone drip and heparin drip in the ICU.  He had acute hypoxic respiratory failure and was transferred to the ICU on 01/20/2021 and was seen by PCCM.  TRH, hospitalist team was initially consulted for hypotension requiring vasopressors, acute kidney injury with a creatinine of 4 and constipation with work-up of ileus versus SBO.  NG tube placed on 01/20/2021.  NG tube removed on 01/28/2021.  On 01/25/2021 NGT output was noted to be coffee-ground for which GI was consulted.  Was also noted to have acute blood loss anemia.  On 01/26/2021 he was noted to have symptomatic anemia 2 unit PRBC were ordered to be transfused.  Heparin drip stopped.  Cardiology consulted.  Hemoglobin dropped on 01/28/2021 post 1 unit PRBC transfusion.  NG tube removed on 01/28/2021 and replaced on 01/30/2021 due to evidence of persistent ileus.  1 unit PRBC transfused for hemoglobin of 7.4 on 01/30/2021.  Plan for EGD on 01/31/2021.  NG tube removed on 02/01/2021.  02/02/21: Patient was seen and examined at his bedside.  He reports positive flatus.  Denies any nausea or vomiting.  He is hoping to have a bowel movement today.  Tolerating a full liquid diet this morning.  He is receptive to inpatient rehab.  Patient and family live in [place] and would like to go to inpatient rehab at [facility] in [place] in order to be close to family.   Assessment/Plan: Principal Problem:   Ileus ([place]) Active Problems:   Type 2 diabetes mellitus without complication, without long-term current use of insulin (HCC)   Essential hypertension   Obesity (BMI 30-39.9)   CAD (coronary artery disease)   Pulmonary  HTN (HCC)   S/P TKR (total knee replacement) using cement   Acute respiratory distress   AKI (acute kidney injury) ([place])   Acute renal failure (HCC)   Benign prostatic hyperplasia   Obstructive sleep apnea treated with continuous positive airway pressure (CPAP)   Hypotension   GERD (gastroesophageal reflux disease)   Protein-calorie malnutrition, moderate (HCC)   Gastrointestinal bleed   PAF (paroxysmal atrial fibrillation) (HCC)   Acute blood loss anemia   History of gastritis   Irritable bowel syndrome with intermittent diarrhea   SOB (shortness of breath)   Anasarca  Status post total left knee replacement 01/16/21 by Dr. [name] POD #17 Management per primary, orthopedic surgery. Out of bed to chair with every shift. Mobilize as tolerated Continue PT OT with assistance and fall precautions.  Postoperative ileus after left knee surgery, partial small bowel obstruction History of small bowel obstruction treated medically at outside facility in [place] in 2019. NG tube was removed on 01/28/2021, NG tube was reinserted on 01/30/2021 with evidence of persistent ileus.  NG tube has been removed on 02/01/2021. Continue to replete and optimize magnesium and potassium levels. Continue to mobilize as tolerated. He is on IV Reglan milligram 3 times daily per GI. Appreciate general surgery and GI consultative services.  Acute blood loss symptomatic anemia secondary to upper GI bleed. History of gastritis. Gastric content sent on 01/25/2021 positive for occult blood. Drop of hemoglobin 6.8 on 01/26/2021, transfused 2 unit PRBCs. Drop of hemoglobin 7.1 from 7.9  on 01/28/2021.  Transfused 1 unit PRBCs.   Hemoglobin 7.4 on 01/30/2021, transfused 1 unit PRBC to maintain hemoglobin above 8. Received Protonix bolus on 01/26/2021.  Protonix drip stopped on 01/30/2021, IV Protonix 40 mg twice daily started. Hemoglobin 15.1 on 01/07/2021. EGD completed on 01/31/2021 revealing esophageal  candidiasis Hemoglobin stable 8.1 from 8.0 from 8.5.   Continue to monitor H&H.  Newly diagnosed Candida esophagitis Started on IV fluconazole on 01/31/21 x 10 days total.  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 Creatinine downtrending, 1.51 from 1.71 from 1.81 from 1.80 from 1.69 from 1.58. Good urine output recorded 1.7 L in the last 24 hours. Continue to avoid nephrotoxic agents, dehydration and hypotension. Repeat chemistry panel in the morning  Acute on chronic diastolic CHF Improving volume overload. Ongoing diuresing. Started on IV Lasix 40 mg twice daily on 01/30/2021 by cardiology with improvement of symptomatology, less work of breathing, less abdominal distention, he still on diuretics IV Lasix 40 mg twice daily. Last 2D echo done on 01/21/2021 showed LVEF 50 to 55% Continue strict I's and O's and daily weight BNP 69 likely not accurate in the setting of obesity with BMI of 36. CVP consistent with volume overload Net I&O +11.7 L>> 10.0 L>> +4.3 L>> +2.6 L.  Resolved hyperammonemia, suspect upper GI bleed Ammonia level 71>> 21 post lactulose. 1 dose of lactulose enema given on 01/30/2021.  Resolved acute toxic metabolic encephalopathy likely in the setting of hyperammonemia Hypersomnolent on 01/30/2021. At time of this visit he is alert and oriented x3. Continue to avoid sedating agents, opiates and benzodiazepines.  Resolved acute hypoxic hypercarbic respiratory failure secondary to recent left lower lobe pneumonia Not on oxygen supplementation at baseline Currently off oxygen supplementation with O2 saturation of 97% on room air.  OSA NG tube removed. Resume CPAP at night  Resolved mixed metabolic and respiratory acidosis Serum bicarb 21, pH 7.218, PCO2 48 on venous blood gas Give 2 A of bicarb on 01/30/2021 Bicarb also in TPN  QTC prolongation. Twelve-lead EKG 01/28/2021 QTC 534. Minimize QTC prolonging agents as possible. DC'd  Reglan. DC'd Zofran and replace with Tigan for nausea Protonix drip stopped, currently on IV Protonix 40 mg twice daily.  New onset perioperative A. fib with RVR, converted to sinus rhythm on 01/28/2021. CHA2DS2-VASc of 4 Was noted on 01/20/2021: Patient was transferred to the ICU Was started on amiodarone drip and heparin drip on 01/20/2021 in the ICU. Heparin drip has been DC'd on 01/26/2021 due to upper GI bleed. Amiodarone drip was DC'd on 01/30/2021 and switched to oral  2D echo was done on 01/21/2021 left atrium normal in size.  EF 50-55%. Twelve-lead EKG on 01/28/2021, converted to sinus rhythm. Management per cardiology.  Rate is controlled.  Essential hypertension BP is currently at goal. He is currently on p.o. amiodarone 200 mg daily, IV Lasix 40 mg twice daily, p.o. Lopressor 25 mg twice daily. Continue to monitor vital signs.  Electrolytes derangement likely secondary to GI losses. Resolved post repletion: Hypokalemia Potassium 3.4>> 3.9> 4.5> 3.7> 3.6. Repleted with TPN  Elevated ALT On TPN Monitor for now  Prediabetes with hyperglycemia  Hemoglobin A1c 5.7 on 01/16/2021 Insulin sliding scale if needed Avoid hypoglycemia  Elevated troponin Peaked at 23 Heparin drip DC'd on 01/26/2021 due to upper GI bleed. Denies any anginal symptoms at the time of this visit.  GERD Protonix 40 mg twice daily.  Gout Resume home regimen when can take adequate oral intake.  Code Status: Full code.  Family Communication: Updated his wife and son at bedside on 02/02/2021.  Disposition Plan: Defer to primary team, possible inpatient rehab.  Patient and family live in [place] and would like to go to inpatient rehab at [facility] in [place] in order to be close to family.    Thank you for allowing [place] to participate in the care of this patient.  We will continue to follow along with you.     Objective: Vitals:   02/02/21 0800 02/02/21 1000 02/02/21 1100 02/02/21 1245  BP:  (!) 158/64 (!) 137/52  (!) 146/61  Pulse: 74 75  75  Resp: (!) 22 (!) 21  (!) 23  Temp:   98.2 F (36.8 C)   TempSrc:   Oral   SpO2: 93% 94%  97%  Weight:      Height:        Intake/Output Summary (Last 24 hours) at 02/02/2021 1315 Last data filed at 02/02/2021 1308 Gross per 24 hour  Intake 3420.81 ml  Output 6400 ml  Net -2979.19 ml   Filed Weights   01/30/21 1027 01/31/21 0500 02/02/21 0420  Weight: 112.2 kg 108.5 kg 100.4 kg    Exam:  . General: 75 y.o. year-old male pleasant well-developed well-nourished in no acute distress.  He is alert and wanted x3.   . Cardiovascular: Regular rate and rhythm no rubs or gallops. Marland Kitchen Respiratory: Clear to auscultation no wheezes or rales.  Good respiratory effort.   . Abdomen: Obese soft hypoactive bowel sounds present.  Nontender with palpation.   . Musculoskeletal: Surgical trip to right knee.  Trace edema in lower extremities bilaterally.   Marland Kitchen Psychiatry: Mood is appropriate for condition and setting.   Data Reviewed: CBC: Recent Labs  Lab 01/28/21 0422 01/28/21 1516 01/29/21 0914 01/29/21 1800 01/30/21 0247 01/30/21 1006 01/30/21 2203 01/31/21 0242 01/31/21 0511 02/01/21 0259 02/02/21 0359  WBC 14.3*  --  11.2*  --  10.8*  --   --   --  10.0 8.0 7.4  NEUTROABS 11.6*  --   --   --   --   --   --   --   --   --   --   HGB 7.1*   < > 8.5*   < > 7.7*  7.7*   < > 8.0* 8.3* 8.5* 8.0* 8.1*  HCT 22.5*   < > 27.1*   < > 24.9*  24.9*   < > 23.9* 25.7* 26.4* 25.5* 25.9*  MCV 96.6  --  95.1  --  96.5  --   --   --  94.3 94.1 94.9  PLT 159  --  189  --  202  --   --   --  202 231 263   < > = values in this interval not displayed.   Basic Metabolic Panel: Recent Labs  Lab 01/29/21 0402 01/30/21 0247 01/31/21 0242 02/01/21 0259 02/02/21 0359  NA 142 141 144 145 142  K 4.8 4.9 3.7 4.0 3.6  CL 115* 113* 113* 113* 104  CO2 20* 21* 22 27 29   GLUCOSE 180* 164* 118* 140* 132*  BUN 86* 98* 98* 87* 69*  CREATININE 1.69* 1.80*  1.81* 1.71* 1.51*  CALCIUM 8.4* 8.3* 8.3* 8.5* 8.6*  MG 2.2 2.2 2.3 2.2 2.0  PHOS 4.8* 4.2 3.9 3.8 4.0   GFR: Estimated Creatinine Clearance: 48.5 mL/min (A) (by C-G formula based on SCr of 1.51 mg/dL (H)). Liver Function Tests:  Recent Labs  Lab 01/28/21 0422 01/31/21 0242 02/02/21 0359  AST 41 29 29  ALT 49* 72* 55*  ALKPHOS 46 55 66  BILITOT 0.8 1.2 0.9  PROT 5.0* 5.0* 5.5*  ALBUMIN 2.3* 2.0* 2.2*   No results for input(s): LIPASE, AMYLASE in the last 168 hours. Recent Labs  Lab 01/30/21 1106 01/31/21 0510  AMMONIA 71* 21   Coagulation Profile: No results for input(s): INR, PROTIME in the last 168 hours. Cardiac Enzymes: No results for input(s): CKTOTAL, CKMB, CKMBINDEX, TROPONINI in the last 168 hours. BNP (last 3 results) No results for input(s): PROBNP in the last 8760 hours. HbA1C: No results for input(s): HGBA1C in the last 72 hours. CBG: Recent Labs  Lab 02/01/21 2336 02/02/21 0330 02/02/21 0743 02/02/21 0856 02/02/21 1229  GLUCAP 143* 135* 134* 149* 153*   Lipid Profile: No results for input(s): CHOL, HDL, LDLCALC, TRIG, CHOLHDL, LDLDIRECT in the last 72 hours. Thyroid Function Tests: No results for input(s): TSH, T4TOTAL, FREET4, T3FREE, THYROIDAB in the last 72 hours. Anemia Panel: No results for input(s): VITAMINB12, FOLATE, FERRITIN, TIBC, IRON, RETICCTPCT in the last 72 hours. Urine analysis:    Component Value Date/Time   COLORURINE AMBER (A) 01/19/2021 1622   APPEARANCEUR CLOUDY (A) 01/19/2021 1622   LABSPEC 1.018 01/19/2021 1622   PHURINE 5.0 01/19/2021 1622   GLUCOSEU NEGATIVE 01/19/2021 1622   HGBUR MODERATE (A) 01/19/2021 1622   BILIRUBINUR NEGATIVE 01/19/2021 1622   KETONESUR NEGATIVE 01/19/2021 1622   PROTEINUR 100 (A) 01/19/2021 1622   NITRITE NEGATIVE 01/19/2021 1622   LEUKOCYTESUR NEGATIVE 01/19/2021 1622   Sepsis Labs: @LABRCNTIP (procalcitonin:4,lacticidven:4)  )No results found for this or any previous visit (from the  past 240 hour(s)).    Studies: No results found.  Scheduled Meds: . (feeding supplement) PROSource Plus  30 mL Oral BID BM  . allopurinol  100 mg Oral QHS  . amiodarone  200 mg Oral Daily  . bisacodyl  10 mg Rectal Daily  . Chlorhexidine Gluconate Cloth  6 each Topical Daily  . docusate sodium  100 mg Oral Daily  . feeding supplement  1 Container Oral BID BM  . furosemide  40 mg Intravenous BID  . insulin aspart  0-15 Units Subcutaneous Q4H  . lip balm  1 application Topical BID  . mouth rinse  15 mL Mouth Rinse BID  . metoCLOPramide (REGLAN) injection  5 mg Intravenous Q8H  . metoprolol succinate  25 mg Oral BID  . pantoprazole  40 mg Intravenous Q12H  . sodium chloride flush  10-40 mL Intracatheter Q12H    Continuous Infusions: . sodium chloride 10 mL/hr at 02/02/21 1308  . fluconazole (DIFLUCAN) IV Stopped (02/02/21 1307)  . potassium chloride 10 mEq (02/02/21 1309)  . TPN ADULT (ION) 80 mL/hr at 02/02/21 1308  . TPN ADULT (ION)       LOS: 17 days     Kayleen Memos, MD [facility] Pager [phone]  If 7PM-7AM, please contact night-coverage [email or web address] Password [email or web address] 02/02/2021, 1:15 PM

## 2021-02-02 NOTE — Progress Notes (Signed)
Orthopedic Tech Progress Note Patient Details:  [name] [birth year] [id number]  CPM Left Knee CPM Left Knee: On Left Knee Flexion (Degrees): 30 Left Knee Extension (Degrees): 0  Post Interventions Patient Tolerated: Well Instructions Provided: Care of device  [place] Pink 02/02/2021, 4:14 PM

## 2021-02-02 NOTE — Progress Notes (Signed)
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 Transferred to ICU on 5/15 due to respiratory failure, ileus, kidney failure    PT Comments    Patient ambulated x 50'. Needed BSC for BM. CPM to start today 0-30 for left knee. Noted oozing  Blood from distal incision. Steristrips intact. Continue pT for mobility. Excel;lent CIR candidate.  Follow Up Recommendations  CIR     Equipment Recommendations  Rolling walker with 5" wheels (DME has been delivered)    Recommendations for Other Services       Precautions / Restrictions Precautions Precautions: Fall;Knee Precaution Comments: monitor vitals Restrictions LLE Weight Bearing: Weight bearing as tolerated    Mobility  Bed Mobility         Supine to sit: Mod assist;HOB elevated     General bed mobility comments: in recliner, then on BSC    Transfers Overall transfer level: Needs assistance Equipment used: Rolling walker (2 wheeled) Transfers: Sit to/from [facility] Sit to Stand: Mod assist;From elevated surface;+2 physical assistance;+2 safety/equipment;Max assist Stand pivot transfers: Mod assist;+2 physical assistance;+2 safety/equipment       General transfer comment: requires boosting mod/max assist of 2 to  stand from recliner x 2. Left foot tends to slide inward and out. multimodal cues to reach back  to sit down. Used RW to stand and take several steps to [facility]  Ambulation/Gait Ambulation/Gait assistance: Mod assist Gait Distance (Feet): 50 Feet Assistive device: Bilateral platform walker Gait Pattern/deviations: Step-to pattern;Decreased stance time - left;Trunk flexed;Decreased weight shift to left;Decreased stride length Gait velocity: decreased   General Gait Details: pt tolerated an increased distance using B platform EVA walker for  increased supposrt. Requires assist to guide RW  VERY weak. Recliner followed closely.  had planned aother walk but patient needed to get to toilet.   Stairs             Wheelchair Mobility    Modified Rankin (Stroke Patients Only)       Balance Overall balance assessment: Needs assistance Sitting-balance support: Feet supported;Single extremity supported Sitting balance-Leahy Scale: Fair Sitting balance - Comments: UE support   Standing balance support: During functional activity;Bilateral upper extremity supported Standing balance-Leahy Scale: Poor Standing balance comment: reliant on support                            Cognition Arousal/Alertness: Awake/alert Behavior During Therapy: WFL for tasks assessed/performed Overall Cognitive Status: Within Functional Limits for tasks assessed                                        Exercises    General Comments        Pertinent Vitals/Pain Faces Pain Scale: Hurts a little bit Pain Location: L knee Pain Descriptors / Indicators: Tightness;Sore Pain Intervention(s): Monitored during session    Home Living                      Prior Function            PT Goals (current goals can now be found in the care plan section) Progress towards PT goals: Progressing toward goals    Frequency    Min  5X/week      PT Plan Current plan remains appropriate    Co-evaluation              AM-PAC PT "6 Clicks" Mobility   Outcome Measure  Help needed turning from your back to your side while in a flat bed without using bedrails?: A Lot Help needed moving from lying on your back to sitting on the side of a flat bed without using bedrails?: A Lot Help needed moving to and from a bed to a chair (including a wheelchair)?: A Lot Help needed standing up from a chair using your arms (e.g., wheelchair or bedside chair)?: A Lot Help needed to walk in hospital room?: A Lot Help needed  climbing 3-5 steps with a railing? : Total 6 Click Score: 11    End of Session Equipment Utilized During Treatment: Gait belt Activity Tolerance: Patient limited by fatigue Patient left: in chair;with call bell/phone within reach (on [facility]) Nurse Communication: Mobility status PT Visit Diagnosis: Other abnormalities of gait and mobility (R26.89)     Time: 1325-1400 PT Time Calculation (min) (ACUTE ONLY): 35 min  Charges:  $Gait Training: 23-37 mins $        Claretha Cooper 02/02/2021, 2:57 PM

## 2021-02-02 NOTE — Plan of Care (Signed)
Discussed with patient plan of care for the evening, pain management and using urinals with some teach back displayed.  Patient requested manual blood pressures tonight and explained as long as within a good range we should be able to give his arm a rest.  Problem: Education: Goal: Knowledge of General Education information will improve Description: Including pain rating scale, medication(s)/side effects and non-pharmacologic comfort measures Outcome: Progressing

## 2021-02-02 NOTE — Progress Notes (Signed)
Subjective: 17 days s/p L TKA, post-op period with multiple complications.  Reports he "popped a stitch" last night. Denies any significant knee pain. Patient reports pain as mild.   Has been up with PT. Excited that he is having a boost, transitioning from clear liquid diet.   Objective: Vital signs in last 24 hours: Temp:  [97.9 F (36.6 C)-98.9 F (37.2 C)] 98.7 F (37.1 C) (05/28 0738) Pulse Rate:  [68-85] 74 (05/28 0800) Resp:  [13-34] 22 (05/28 0800) BP: (122-182)/(44-64) 158/64 (05/28 0800) SpO2:  [93 %-100 %] 93 % (05/28 0800) Weight:  [100.4 kg] 100.4 kg (05/28 0420)  Intake/Output from previous day: 05/27 0701 - 05/28 0700 In: 3768.7 [P.O.:750; I.V.:2968.9; IV Piggyback:49.8] Out: 5800 [Urine:5550; Emesis/NG output:250] Intake/Output this shift: No intake/output data recorded.  Recent Labs    01/30/21 2203 01/31/21 0242 01/31/21 0511 02/01/21 0259 02/02/21 0359  HGB 8.0* 8.3* 8.5* 8.0* 8.1*   Recent Labs    02/01/21 0259 02/02/21 0359  WBC 8.0 7.4  RBC 2.71* 2.73*  HCT 25.5* 25.9*  PLT 231 263   Recent Labs    02/01/21 0259 02/02/21 0359  NA 145 142  K 4.0 3.6  CL 113* 104  CO2 27 29  BUN 87* 69*  CREATININE 1.71* 1.51*  GLUCOSE 140* 132*  CALCIUM 8.5* 8.6*   No results for input(s): LABPT, INR in the last 72 hours.  Neurologically intact Neurovascular intact Sensation intact distally Intact pulses distally Dorsiflexion/Plantar flexion intact Incision: staples removed. Minimal dry blood on steri-strips. No wound dehiscence.  No cellulitis present Compartment soft no sign of DVT   Assessment/Plan: 17 days s/p L TKA Up with therapy Up with PT when able otherwise  ROM at  Bedside 2x/day Eventual D/C to SNF when medically ready    Charlyne Petrin 02/02/2021, 8:21 AM

## 2021-02-02 NOTE — Progress Notes (Signed)
PHARMACY - TOTAL PARENTERAL NUTRITION CONSULT NOTE   Indication: Prolonged ileus  Patient Measurements: Height: '5\' 8"'  (172.7 cm) Weight: 100.4 kg (221 lb 5.5 oz) IBW/kg (Calculated) : 68.4 TPN AdjBW (KG): 77.4 Body mass index is 33.65 kg/m. 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 Post op course complicated by Afib, aspiration PNA and prolonged ileus. Pharmacy consulted to manage TPN for nutrition support.  Glucose / Insulin: Hx DM2 on only metformin PTA, A1c 5.7.  Solucortef d/c 5/20  CBG range 135-156  Regular Insulin in TPN: 30 units since 5/27  Moderate SSI 13 units/24 hrs Electrolytes: K down to 3.6, goal >=4; Mg 2, goal >2; Cl, CO2 wnl.  CoCa 10.1 WNL. Renal: SCr down to 1.5, BUN down to 69 (contrast 5/24)  Lasix BID (monitor elytes closely) Hepatic: AST and Alk Phos WNL, ALT elevated/decreased to 55; mild bump in tbili resolved; Trig 98 (5/10)> 123 (5/23) Preabumin 10 (5/19), 13.5 (5/23) I/O, mIVF:  IVF: NS @ 10  I/O net -2L, UOP 5550 mL, NG output 250 mL.  Stool x1  Relistor x2 (5/15, 5/17), miralax BID (5/18-5/21). Bisacodyl supp daily (5/21 > ), scheduled IV reglan (5/15-5/23, 5/26 - ), docusate daily (5/24 - ) GI Imaging:  - 5/18 CT A/P: ileus vs distal SBO -5/19 KUB: diffuse gaseous bowel distention persists -5/20 KUB: persistent & stable SBO -5/21 KUB: unchanged gaseous distention of colon & SB, contrast from 2 days ago in cecum - 5/24 CT: Diffusely distended small and large bowel in keeping with underlying ileus. Diffuse body wall subcutaneous edema, mild ascites in keeping with anasarca. GI Surgeries / Procedures:  5/26 EGD: esophageal candidiasis, very minimal non-specific gastritis of doubtful clinical significance  Central access: CVC; PICC placed 5/19 for TPN TPN start date: 01/23/21  Nutritional Goals (RD recommendations 5/24):  Kcal:  1880-2090 kcal, Protein:  90-100 grams, Fluid:  >/= 2 L/day  Current TPN at  goal rate of 80 mL/hr provides 106 g of protein, 1978 kcals per day  Keep K >/= 4 and Mg >/=2 with ileus  Current Nutrition:  TPN at goal rate Per GI on 5/27, NG clamping trial x4 hours, then removed NG tube. Diet: advanced to FLD on 5/28 AM  Feeding supplements:  Boost/Breeze BID, Prosource BID  Plan:   Now:  KCl IV 10 mEq IV x 4 doses   Continue TPN @ 61m/hr - provides 100% of goals  Electrolytes in TPN:  Na - 583m/L  K - 40 mEq/L  Ca - 20m26mL  Mg - 5 mEq/L  Phos - 5 mmol/L  Cl:Ac ratio: max Acetate  Add standard MVI and trace elements to TPN  Continue moderate scale SSI q4h and adjust as needed  Regular insulin, 30 units in TPN  MIVF per MD  Monitor TPN labs on Mon/Thurs.   ChrGretta ArabarmD, BCPS Clinical Pharmacist [facility] main pharmacy [phone]/2022 9:17 AM

## 2021-02-02 NOTE — Progress Notes (Signed)
Physical Therapy Treatment Patient Details Name: [name] MRN: [id number] DOB: 10-Oct-1945 Today's Date: 02/02/2021    History of Present Illness Pt is a 75 year old male s/p left TKA on 01/16/21. Transferred to ICU on 5/15 due to respiratory failure, ileus, kidney failure    PT Comments    The patient  Participated in A/AAROM exercises to left knee. Assisted to [facility]. Will return to ambulate . L eft knee  Flexion ~ 10-70degrees..  Instructed patient and family to place  Heel roll to increase knee extension. Patient tends to lie in bed with Left leg ER and kne is flexed.  Follow Up Recommendations  CIR     Equipment Recommendations  Rolling walker with 5" wheels    Recommendations for Other Services       Precautions / Restrictions Precautions Precautions: Fall;Knee Precaution Comments: monitor vitals    Mobility  Bed Mobility         Supine to sit: Mod assist;HOB elevated     General bed mobility comments: patient moved legs to bed edge, Assisted with trunk to sit upright    Transfers Overall transfer level: Needs assistance   Transfers: Sit to/from Stand;Stand Pivot Transfers Sit to Stand: Mod assist;From elevated surface Stand pivot transfers: Mod assist;+2 physical assistance       General transfer comment: patient  partially stood to pivot to [facility] with mod assist.  Ambulation/Gait                 Stairs             Wheelchair Mobility    Modified Rankin (Stroke Patients Only)       Balance Overall balance assessment: Needs assistance Sitting-balance support: Feet supported;Single extremity supported Sitting balance-Leahy Scale: Fair     Standing balance support: During functional activity;Bilateral upper extremity supported Standing balance-Leahy Scale: Poor Standing balance comment: reliant on support                            Cognition Arousal/Alertness: Awake/alert Behavior During Therapy: WFL for tasks  assessed/performed Overall Cognitive Status: Within Functional Limits for tasks assessed                                        Exercises Total Joint Exercises Ankle Circles/Pumps: AROM;Both;10 reps;Supine Short Arc Quad: AROM;10 reps;Supine;Left Heel Slides: AAROM;Left;10 reps Hip ABduction/ADduction: AROM;Left;10 reps;Supine Straight Leg Raises: AAROM;AROM;Left;10 reps;Supine Goniometric ROM: 10-70 left knee flex    General Comments        Pertinent Vitals/Pain Faces Pain Scale: Hurts a little bit Pain Location: L knee Pain Descriptors / Indicators: Tightness;Sore Pain Intervention(s): Monitored during session    Home Living                      Prior Function            PT Goals (current goals can now be found in the care plan section) Progress towards PT goals: Progressing toward goals    Frequency    Min 5X/week      PT Plan Frequency needs to be updated    Co-evaluation              AM-PAC PT "6 Clicks" Mobility   Outcome Measure  Help needed turning from your back to your side while in a flat bed  without using bedrails?: A Lot Help needed moving from lying on your back to sitting on the side of a flat bed without using bedrails?: A Lot Help needed moving to and from a bed to a chair (including a wheelchair)?: A Lot Help needed standing up from a chair using your arms (e.g., wheelchair or bedside chair)?: A Lot Help needed to walk in hospital room?: A Lot Help needed climbing 3-5 steps with a railing? : Total 6 Click Score: 11    End of Session   Activity Tolerance: Patient limited by fatigue Patient left: in chair;with call bell/phone within reach (on [facility]) Nurse Communication: Mobility status PT Visit Diagnosis: Other abnormalities of gait and mobility (R26.89)     Time: 1100-1135 PT Time Calculation (min) (ACUTE ONLY): 35 min  Charges:  $Therapeutic Exercise: 8-22 mins $Therapeutic Activity: 8-22 mins                      [place] Pager [phone] Office [phone]    Claretha Cooper 02/02/2021, 1:19 PM

## 2021-02-02 NOTE — Progress Notes (Signed)
2 Days Post-Op   Subjective/Chief Complaint: NGT out pt without complaint 2 BM no N/V    Objective: Vital signs in last 24 hours: Temp:  [97.9 F (36.6 C)-98.9 F (37.2 C)] 98.7 F (37.1 C) (05/28 0738) Pulse Rate:  [68-85] 69 (05/28 0738) Resp:  [13-34] 15 (05/28 0738) BP: (122-182)/(44-57) 153/55 (05/28 0738) SpO2:  [93 %-100 %] 98 % (05/28 0738) Weight:  [100.4 kg] 100.4 kg (05/28 0420) Last BM Date: 02/01/21  Intake/Output from previous day: 05/27 0701 - 05/28 0700 In: 3768.7 [P.O.:750; I.V.:2968.9; IV Piggyback:49.8] Out: 5800 [Urine:5550; Emesis/NG output:250] Intake/Output this shift: No intake/output data recorded.  GI: MILD DISTENTION SOFT nt   Lab Results:  Recent Labs    02/01/21 0259 02/02/21 0359  WBC 8.0 7.4  HGB 8.0* 8.1*  HCT 25.5* 25.9*  PLT 231 263   BMET Recent Labs    02/01/21 0259 02/02/21 0359  NA 145 142  K 4.0 3.6  CL 113* 104  CO2 27 29  GLUCOSE 140* 132*  BUN 87* 69*  CREATININE 1.71* 1.51*  CALCIUM 8.5* 8.6*   PT/INR No results for input(s): LABPROT, INR in the last 72 hours. ABG Recent Labs    01/30/21 1106  HCO3 19.1*    Studies/Results: No results found.  Anti-infectives: Anti-infectives (From admission, onward)   Start     Dose/Rate Route Frequency Ordered Stop   01/31/21 1200  fluconazole (DIFLUCAN) IVPB 100 mg        100 mg 50 mL/hr over 60 Minutes Intravenous Every 24 hours 01/31/21 1113 02/10/21 1159   01/20/21 1830  vancomycin (VANCOREADY) IVPB 2000 mg/400 mL        2,000 mg 200 mL/hr over 120 Minutes Intravenous  Once 01/20/21 1738 01/20/21 2005   01/20/21 1737  vancomycin variable dose per unstable renal function (pharmacist dosing)  Status:  Discontinued         Does not apply See admin instructions 01/20/21 1738 01/22/21 0729   01/20/21 1400  cefTRIAXone (ROCEPHIN) 2 g in sodium chloride 0.9 % 100 mL IVPB        2 g 200 mL/hr over 30 Minutes Intravenous Every 24 hours 01/20/21 1347 01/25/21 1359    01/16/21 2000  vancomycin (VANCOREADY) IVPB 1250 mg/250 mL       Note to Pharmacy: Per pharmacy   1,250 mg 166.7 mL/hr over 90 Minutes Intravenous Every 12 hours 01/16/21 1329 01/16/21 2155   01/16/21 0600  vancomycin (VANCOREADY) IVPB 1500 mg/300 mL        1,500 mg 150 mL/hr over 120 Minutes Intravenous On call to O.R. 01/15/21 0839 01/16/21 1000   01/09/21 0600  vancomycin (VANCOREADY) IVPB 1500 mg/300 mL        1,500 mg 150 mL/hr over 120 Minutes Intravenous On call to O.R. 01/08/21 0806 01/10/21 0559   01/09/21 0600  gentamicin (GARAMYCIN) 410 mg in dextrose 5 % 100 mL IVPB        5 mg/kg  82.8 kg (Adjusted) 220.5 mL/hr over 30 Minutes Intravenous On call to O.R. 01/08/21 0813 01/10/21 0559      Assessment/Plan: Ileus s/p orthopedic surgery - CT 5/25 with distension of stomach, small and large bowel - ileus similar to prior CT (5/21). Oral contrast in rectum from prior CT - EGD 5/26 - findings consistent with candida infection and minimal inflammation/gastritis  NGT out - tolerating clears adv diet to full  - reglan per GI 5/26 - mobilize as tolerated to help recovery  -  continued TPN - getting daily suppository. PO colace added 5/24  Concern for upper GI bleeding gastritis.  - Hgb 8.0 (8.5) stable s/p 1 unit PRBCs 5/25 - EGD 5/26 - findings consistent with candida infection and minimal inflammation/gastritis - continuous PPIs   LOS: 17 days    Joyice Faster Marlow Hendrie MD  02/02/2021

## 2021-02-02 NOTE — Progress Notes (Signed)
[facility] Progress Note    Since last GI note: NG tube removed, no n/v since then and he's tolerating full liquid diet so far this morning.   Passing a lot of gas. No abd pains.  In much better spirits. FAmily is in the room  Objective: Vital signs in last 24 hours: Temp:  [97.9 F (36.6 C)-98.9 F (37.2 C)] 98.7 F (37.1 C) (05/28 0738) Pulse Rate:  [68-85] 74 (05/28 0800) Resp:  [13-33] 22 (05/28 0800) BP: (122-182)/(44-64) 158/64 (05/28 0800) SpO2:  [93 %-100 %] 93 % (05/28 0800) Weight:  [100.4 kg] 100.4 kg (05/28 0420) Last BM Date: 02/02/21 General: alert and oriented times 3 Heart: regular rate and rythm Abdomen: softer, much less distended, non-tender, non-distended, normal bowel sounds  Lab Results: Recent Labs    01/31/21 0511 02/01/21 0259 02/02/21 0359  WBC 10.0 8.0 7.4  HGB 8.5* 8.0* 8.1*  PLT 202 231 263  MCV 94.3 94.1 94.9   Recent Labs    01/31/21 0242 02/01/21 0259 02/02/21 0359  NA 144 145 142  K 3.7 4.0 3.6  CL 113* 113* 104  CO2 22 27 29   GLUCOSE 118* 140* 132*  BUN 98* 87* 69*  CREATININE 1.81* 1.71* 1.51*  CALCIUM 8.3* 8.5* 8.6*   Recent Labs    01/31/21 0242 02/02/21 0359  PROT 5.0* 5.5*  ALBUMIN 2.0* 2.2*  AST 29 29  ALT 72* 55*  ALKPHOS 55 66  BILITOT 1.2 0.9    Medications: Scheduled Meds: . (feeding supplement) PROSource Plus  30 mL Oral BID BM  . allopurinol  100 mg Oral QHS  . amiodarone  200 mg Oral Daily  . bisacodyl  10 mg Rectal Daily  . Chlorhexidine Gluconate Cloth  6 each Topical Daily  . docusate sodium  100 mg Oral Daily  . feeding supplement  1 Container Oral BID BM  . furosemide  40 mg Intravenous BID  . insulin aspart  0-15 Units Subcutaneous Q4H  . lip balm  1 application Topical BID  . mouth rinse  15 mL Mouth Rinse BID  . metoCLOPramide (REGLAN) injection  5 mg Intravenous Q8H  . metoprolol succinate  12.5 mg Oral Daily  . pantoprazole  40 mg Intravenous Q12H  . sodium chloride  flush  10-40 mL Intracatheter Q12H   Continuous Infusions: . sodium chloride 10 mL/hr at 02/02/21 0600  . fluconazole (DIFLUCAN) IV Stopped (02/01/21 1251)  . potassium chloride 10 mEq (02/02/21 0949)  . TPN ADULT (ION) 80 mL/hr at 02/02/21 0600  . TPN ADULT (ION)     PRN Meds:.acetaminophen, alum & mag hydroxide-simeth, diphenhydrAMINE, HYDROmorphone (DILAUDID) injection, magic mouthwash, menthol-cetylpyridinium, metoprolol tartrate, naphazoline-glycerin, phenol, sodium chloride, sodium chloride flush, trimethobenzamide  Assessment/Plan: 75 y.o. male with prolonged ileus following orthopedic surgery 5/10  Seems to be making good strides in past 1-2 days.  NG tube pulled yesterday and he is tolerated full liquids, passing gas.  Recs: continue reglan TID, continue TNA, if he tolerates full liquids the rest of the day then OK to advance to heart healthy diet tomorrow.  Continue mobilizing him as much as possible.  [facility] will see him again on Tuesday (holiday weekend) but we are available 24/7 if any questions or concerns arise before then.  Milus Banister, MD  02/02/2021, 10:06 AM [facility] Pager [phone]

## 2021-02-03 ENCOUNTER — Encounter (HOSPITAL_COMMUNITY): Payer: Self-pay | Admitting: Gastroenterology

## 2021-02-03 DIAGNOSIS — I480 Paroxysmal atrial fibrillation: Secondary | ICD-10-CM | POA: Diagnosis not present

## 2021-02-03 DIAGNOSIS — K567 Ileus, unspecified: Secondary | ICD-10-CM | POA: Diagnosis not present

## 2021-02-03 DIAGNOSIS — I5032 Chronic diastolic (congestive) heart failure: Secondary | ICD-10-CM | POA: Diagnosis not present

## 2021-02-03 DIAGNOSIS — D62 Acute posthemorrhagic anemia: Secondary | ICD-10-CM | POA: Diagnosis not present

## 2021-02-03 DIAGNOSIS — I25118 Atherosclerotic heart disease of native coronary artery with other forms of angina pectoris: Secondary | ICD-10-CM

## 2021-02-03 DIAGNOSIS — R0603 Acute respiratory distress: Secondary | ICD-10-CM | POA: Diagnosis not present

## 2021-02-03 DIAGNOSIS — N179 Acute kidney failure, unspecified: Secondary | ICD-10-CM | POA: Diagnosis not present

## 2021-02-03 LAB — BASIC METABOLIC PANEL
Anion gap: 6 (ref 5–15)
BUN: 60 mg/dL — ABNORMAL HIGH (ref 8–23)
CO2: 30 mmol/L (ref 22–32)
Calcium: 8.7 mg/dL — ABNORMAL LOW (ref 8.9–10.3)
Chloride: 102 mmol/L (ref 98–111)
Creatinine, Ser: 1.56 mg/dL — ABNORMAL HIGH (ref 0.61–1.24)
GFR, Estimated: 46 mL/min — ABNORMAL LOW (ref >60)
Glucose, Bld: 133 mg/dL — ABNORMAL HIGH (ref 70–99)
Potassium: 4.4 mmol/L (ref 3.5–5.1)
Sodium: 138 mmol/L (ref 135–145)

## 2021-02-03 LAB — PHOSPHORUS: Phosphorus: 4.5 mg/dL (ref 2.5–4.6)

## 2021-02-03 LAB — GLUCOSE, CAPILLARY
Glucose-Capillary: 129 mg/dL — ABNORMAL HIGH (ref 70–99)
Glucose-Capillary: 147 mg/dL — ABNORMAL HIGH (ref 70–99)
Glucose-Capillary: 157 mg/dL — ABNORMAL HIGH (ref 70–99)
Glucose-Capillary: 151 mg/dL — ABNORMAL HIGH (ref 70–99)
Glucose-Capillary: 172 mg/dL — ABNORMAL HIGH (ref 70–99)

## 2021-02-03 LAB — MAGNESIUM: Magnesium: 2.1 mg/dL (ref 1.7–2.4)

## 2021-02-03 MED ORDER — ASPIRIN EC 81 MG PO TBEC
81.0000 mg | DELAYED_RELEASE_TABLET | Freq: Every day | ORAL | Status: DC
  Administered 2021-02-03 – 2021-02-05 (×3): 81 mg via ORAL
  Filled 2021-02-03 (×3): qty 1

## 2021-02-03 MED ORDER — ACETAMINOPHEN 325 MG PO TABS
650.0000 mg | ORAL_TABLET | Freq: Four times a day (QID) | ORAL | Status: DC | PRN
  Administered 2021-02-03 – 2021-02-06 (×4): 650 mg via ORAL
  Filled 2021-02-03 (×4): qty 2

## 2021-02-03 MED ORDER — TRAVASOL 10 % IV SOLN
INTRAVENOUS | Status: DC
  Filled 2021-02-03: qty 1056

## 2021-02-03 MED ORDER — PANTOPRAZOLE SODIUM 40 MG PO TBEC
40.0000 mg | DELAYED_RELEASE_TABLET | Freq: Two times a day (BID) | ORAL | Status: DC
  Administered 2021-02-03 – 2021-02-06 (×7): 40 mg via ORAL
  Filled 2021-02-03 (×7): qty 1

## 2021-02-03 MED ORDER — TRAVASOL 10 % IV SOLN
INTRAVENOUS | Status: DC
  Filled 2021-02-03: qty 528

## 2021-02-03 MED ORDER — FUROSEMIDE 10 MG/ML IJ SOLN
40.0000 mg | Freq: Two times a day (BID) | INTRAMUSCULAR | Status: DC
  Administered 2021-02-03: 40 mg via INTRAVENOUS
  Filled 2021-02-03: qty 4

## 2021-02-03 MED ORDER — TRAMADOL HCL 50 MG PO TABS
50.0000 mg | ORAL_TABLET | Freq: Four times a day (QID) | ORAL | Status: DC | PRN
  Administered 2021-02-05: 50 mg via ORAL
  Filled 2021-02-03: qty 1

## 2021-02-03 NOTE — Progress Notes (Signed)
3 Days Post-Op   Subjective/Chief Complaint: none  No N/V had a BM tolerated liquids    Objective: Vital signs in last 24 hours: Temp:  [97.6 F (36.4 C)-98.9 F (37.2 C)] 98.6 F (37 C) (05/29 0800) Pulse Rate:  [65-80] 74 (05/29 0900) Resp:  [13-29] 16 (05/29 0900) BP: (123-150)/(44-126) 144/56 (05/29 0814) SpO2:  [93 %-99 %] 96 % (05/29 0900) Last BM Date: 02/02/21  Intake/Output from previous day: 05/28 0701 - 05/29 0700 In: 3219.7 [P.O.:840; I.V.:2129.7; IV Piggyback:250] Out: 3800 [Urine:3800] Intake/Output this shift: Total I/O In: 480 [P.O.:480] Out: 200 [Urine:200]  GI: obese soft NT ND   Lab Results:  Recent Labs    02/01/21 0259 02/02/21 0359  WBC 8.0 7.4  HGB 8.0* 8.1*  HCT 25.5* 25.9*  PLT 231 263   BMET Recent Labs    02/02/21 0359 02/03/21 0608  NA 142 138  K 3.6 4.4  CL 104 102  CO2 29 30  GLUCOSE 132* 133*  BUN 69* 60*  CREATININE 1.51* 1.56*  CALCIUM 8.6* 8.7*   PT/INR No results for input(s): LABPROT, INR in the last 72 hours. ABG No results for input(s): PHART, HCO3 in the last 72 hours.  Invalid input(s): PCO2, PO2  Studies/Results: No results found.  Anti-infectives: Anti-infectives (From admission, onward)   Start     Dose/Rate Route Frequency Ordered Stop   01/31/21 1200  fluconazole (DIFLUCAN) IVPB 100 mg        100 mg 50 mL/hr over 60 Minutes Intravenous Every 24 hours 01/31/21 1113 02/10/21 1159   01/20/21 1830  vancomycin (VANCOREADY) IVPB 2000 mg/400 mL        2,000 mg 200 mL/hr over 120 Minutes Intravenous  Once 01/20/21 1738 01/20/21 2005   01/20/21 1737  vancomycin variable dose per unstable renal function (pharmacist dosing)  Status:  Discontinued         Does not apply See admin instructions 01/20/21 1738 01/22/21 0729   01/20/21 1400  cefTRIAXone (ROCEPHIN) 2 g in sodium chloride 0.9 % 100 mL IVPB        2 g 200 mL/hr over 30 Minutes Intravenous Every 24 hours 01/20/21 1347 01/25/21 1359   01/16/21 2000   vancomycin (VANCOREADY) IVPB 1250 mg/250 mL       Note to Pharmacy: Per pharmacy   1,250 mg 166.7 mL/hr over 90 Minutes Intravenous Every 12 hours 01/16/21 1329 01/16/21 2155   01/16/21 0600  vancomycin (VANCOREADY) IVPB 1500 mg/300 mL        1,500 mg 150 mL/hr over 120 Minutes Intravenous On call to O.R. 01/15/21 0839 01/16/21 1000   01/09/21 0600  vancomycin (VANCOREADY) IVPB 1500 mg/300 mL        1,500 mg 150 mL/hr over 120 Minutes Intravenous On call to O.R. 01/08/21 0806 01/10/21 0559   01/09/21 0600  gentamicin (GARAMYCIN) 410 mg in dextrose 5 % 100 mL IVPB        5 mg/kg  82.8 kg (Adjusted) 220.5 mL/hr over 30 Minutes Intravenous On call to O.R. 01/08/21 0813 01/10/21 0559      Assessment/Plan: ILEUS  Bowel function has returned  Adv diet  will sign off  Call if anything changes    LOS: 18 days    Marcello Moores A Linsey Hirota 02/03/2021

## 2021-02-03 NOTE — Progress Notes (Signed)
Physical Therapy Treatment Patient Details Name: [name] MRN: [id number] DOB: [birth year] Today's Date: 02/03/2021    History of Present Illness Pt is a 75 year old male s/p left TKA on 01/16/21. Transferred to ICU on 5/15 due to respiratory failure, ileus, kidney failure    PT Comments    Pt cooperative but requiring encouragement to attempt ambulation vs transfer directly back to bed.     Follow Up Recommendations  CIR     Equipment Recommendations  Rolling walker with 5" wheels    Recommendations for Other Services       Precautions / Restrictions Precautions Precautions: Fall;Knee Precaution Comments: monitor vitals Required Braces or Orthoses: Knee Immobilizer - Left Knee Immobilizer - Left: On when out of bed or walking Restrictions Weight Bearing Restrictions: No LLE Weight Bearing: Weight bearing as tolerated    Mobility  Bed Mobility Overal bed mobility: Needs Assistance Bed Mobility: Sit to Supine     Supine to sit: Mod assist;HOB elevated Sit to supine: Min assist;+2 for physical assistance   General bed mobility comments: cues for sequence with assist to manage L LE and to control trunk    Transfers Overall transfer level: Needs assistance Equipment used: Rolling walker (2 wheeled) Transfers: Sit to/from Stand Sit to Stand: Min assist;+2 physical assistance;+2 safety/equipment Stand pivot transfers: Min assist;+2 physical assistance;+2 safety/equipment       General transfer comment: cues for LE management and use of UEs to self assist.  Physical assist to bring wt up and fwd and to balance in initial standing.  Ambulation/Gait Ambulation/Gait assistance: Min assist;+2 physical assistance;+2 safety/equipment Gait Distance (Feet): 24 Feet Assistive device: Rolling walker (2 wheeled) Gait Pattern/deviations: Step-to pattern;Decreased stance time - left;Trunk flexed;Decreased weight shift to left;Decreased stride length Gait velocity:  decreased   General Gait Details: Cues for sequence, posture, safety awareness and position from RW.  Distance ltd by fatigue   Stairs             Wheelchair Mobility    Modified Rankin (Stroke Patients Only)       Balance Overall balance assessment: Needs assistance Sitting-balance support: No upper extremity supported;Feet supported Sitting balance-Leahy Scale: Fair Sitting balance - Comments: UE support   Standing balance support: During functional activity;Bilateral upper extremity supported Standing balance-Leahy Scale: Poor Standing balance comment: reliant on UEs and external assistance                            Cognition Arousal/Alertness: Awake/alert Behavior During Therapy: WFL for tasks assessed/performed;Impulsive Overall Cognitive Status: Impaired/Different from baseline Area of Impairment: Following commands                       Following Commands: Follows one step commands inconsistently;Follows one step commands with increased time       General Comments: decreased carry over of cues to reach back with B UEs in sitting, appears almost anxious at times and states "oh lordy"      Exercises Total Joint Exercises Ankle Circles/Pumps: AROM;Both;Supine;20 reps Quad Sets: AROM;Both;Supine;15 reps Heel Slides: AAROM;Left;20 reps;Supine Hip ABduction/ADduction: AROM;Left;Supine;15 reps Straight Leg Raises: AAROM;Left;Supine;20 reps Goniometric ROM: -10 - 70 AAROM    General Comments        Pertinent Vitals/Pain Pain Assessment: Faces Faces Pain Scale: Hurts little more Pain Location: L knee with therex Pain Descriptors / Indicators: Sore Pain Intervention(s): Limited activity within patient's tolerance;Monitored during session;Ice applied  Home Living                      Prior Function            PT Goals (current goals can now be found in the care plan section) Acute Rehab PT Goals Patient Stated Goal:  to go home to wife PT Goal Formulation: With patient Time For Goal Achievement: 02/13/21 Potential to Achieve Goals: Fair Progress towards PT goals: Progressing toward goals    Frequency    Min 5X/week      PT Plan Current plan remains appropriate    Co-evaluation PT/OT/SLP Co-Evaluation/Treatment: Yes Reason for Co-Treatment: For patient/therapist safety PT goals addressed during session: Mobility/safety with mobility OT goals addressed during session: ADL's and self-care      AM-PAC PT "6 Clicks" Mobility   Outcome Measure  Help needed turning from your back to your side while in a flat bed without using bedrails?: A Lot Help needed moving from lying on your back to sitting on the side of a flat bed without using bedrails?: A Lot Help needed moving to and from a bed to a chair (including a wheelchair)?: A Lot Help needed standing up from a chair using your arms (e.g., wheelchair or bedside chair)?: A Lot Help needed to walk in hospital room?: A Lot Help needed climbing 3-5 steps with a railing? : Total 6 Click Score: 11    End of Session Equipment Utilized During Treatment: Gait belt Activity Tolerance: Patient limited by fatigue Patient left: in bed;with call bell/phone within reach;with family/visitor present Nurse Communication: Mobility status PT Visit Diagnosis: Other abnormalities of gait and mobility (R26.89)     Time: 2263-3354 PT Time Calculation (min) (ACUTE ONLY): 16 min  Charges:  $Gait Training: 8-22 mins $Therapeutic Exercise: 8-22 mins $Therapeutic Activity: 8-22 mins                     [place] Pager [phone] Office [phone]    Jacquese Cassarino 02/03/2021, 3:55 PM

## 2021-02-03 NOTE — Progress Notes (Addendum)
PHARMACY - TOTAL PARENTERAL NUTRITION CONSULT NOTE   Indication: Prolonged ileus  Patient Measurements: Height: _0  (172.7 cm) Weight: 100.4 kg (221 lb 5.5 oz) IBW/kg (Calculated) : 68.4 TPN AdjBW (KG): 77.4 Body mass index is 33.65 kg/m. 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 Post op course complicated by Afib, aspiration PNA and prolonged ileus. Pharmacy consulted to manage TPN for nutrition support.  Glucose / Insulin: Hx DM2 on only metformin PTA, A1c 5.7.  Solucortef d/c 5/20  CBG range 129-162  Regular Insulin in TPN: 30 units   Moderate SSI 12 units/24 hrs Electrolytes: K up to 4.4 (after replacement 5/28, no hemolysis), goal >=4; Mg 2.1, goal >2; Cl, CO2 wnl.  CoCa 10.1 WNL. Renal: SCr 1.56, BUN down to 60 (contrast 5/24)  Lasix BID (monitor elytes closely) Hepatic: AST and Alk Phos WNL, ALT elevated/decreased to 55; mild bump in tbili resolved; Trig 98 (5/10)> 123 (5/23) Preabumin 10 (5/19), 13.5 (5/23) I/O, mIVF:  IVF: NS @ 10  I/O net -548m, UOP 1.6 mL/kg/hr, Stool x2  Relistor x2 (5/15, 5/17), miralax BID (5/18-5/21). Bisacodyl supp daily (5/21 > ), scheduled IV reglan (5/15-5/23, 5/26 - ), docusate daily (5/24 - ) GI Imaging:  - 5/18 CT A/P: ileus vs distal SBO -5/19 KUB: diffuse gaseous bowel distention persists -5/20 KUB: persistent & stable SBO -5/21 KUB: unchanged gaseous distention of colon & SB, contrast from 2 days ago in cecum - 5/24 CT: Diffusely distended small and large bowel in keeping with underlying ileus. Diffuse body wall subcutaneous edema, mild ascites in keeping with anasarca. GI Surgeries / Procedures:  5/26 EGD: esophageal candidiasis, very minimal non-specific gastritis of doubtful clinical significance  Central access: CVC; PICC placed 5/19 for TPN TPN start date: 01/23/21  Nutritional Goals (RD recommendations 5/24):  Kcal:  1880-2090 kcal, Protein:  90-100 grams, Fluid:  >/= 2  L/day  Current TPN at goal rate of 80 mL/hr provides 106 g of protein, 1978 kcals per day  Keep K >/= 4 and Mg >/=2 with ileus  Current Nutrition:  TPN at goal rate Per GI on 5/27, NG clamping trial x4 hours, then removed NG tube. Diet: advanced to FLD on 5/28 AM  - Meal intake not recorded, RN reports he is eating all of the full liquid diet tray. Feeding supplements:  Boost/Breeze BID, Prosource BID   Plan:   Continue TPN @ 825mhr - provides 100% of goals  Electrolytes in TPN:  Na - 5064mL  K - 30 mEq/L   Ca - 5mE96m  Mg - 5 mEq/L  Phos - 5 mmol/L  Cl:Ac ratio: change to 1:1   Add standard MVI and trace elements to TPN  Continue moderate scale SSI q4h and adjust as needed  Regular insulin, 30 units in TPN  MIVF per MD  Monitor TPN labs on Mon/Thurs.  Follow up ability to wean TPN if eating FLD to provide >60% of needs.    ChriGretta ArabrmD, BCPS Clinical Pharmacist [facility] main pharmacy [phone]/2022 8:20 AM     Addendum: Decrease to 1/2 rate TPN today, then discontinue on 5/20 per Dr. ArriCathlean Sauerduce to TPN @ 40 ml/hr today at 18:00 Follow up on insulin requirements.  ChriGretta ArabrmD, BCPS Clinical Pharmacist [facility] main pharmacy [phone]/2022 10:57 AM

## 2021-02-03 NOTE — NC FL2 (Signed)
[place] LEVEL OF CARE SCREENING TOOL     IDENTIFICATION  Patient Name: [name] Birthdate: 12/03/1945 Sex: male Admission Date (Current Location): 01/16/2021  [facility] and [place] Number:  Herbalist and Address:  [facility],  [address], [place]      Provider Number: [id number]  Attending Physician Name and Address:  Susa Day, MD  Relative Name and Phone Number:  Jakari, Sada [facility])   [phone] [facility] Phone), Ramelo Oetken [phone] (son)    Current Level of Care: Hospital Recommended Level of Care: Other (Comment),Skilled Nursing Facility (Inpatient Rehab) Prior Approval Number:    Date Approved/Denied:   PASRR Number: Pending  Discharge Plan: Other (Comment) (Inpatient Rehab)    Current Diagnoses: Patient Active Problem List   Diagnosis Date Noted  . Anasarca   . SOB (shortness of breath)   . Protein-calorie malnutrition, moderate ([place]) 01/26/2021  . Gastrointestinal bleed 01/26/2021  . PAF (paroxysmal atrial fibrillation) ([place]) 01/26/2021  . Acute blood loss anemia 01/26/2021  . History of gastritis 01/26/2021  . History of adenomatous polyp of colon 01/26/2021  . Diverticulosis of colon without hemorrhage 01/26/2021  . Intermittent diarrhea 01/26/2021  . Irritable bowel syndrome with intermittent diarrhea 01/26/2021  . GERD (gastroesophageal reflux disease)   . Ileus ([place])   . Acute respiratory distress 01/20/2021  . AKI (acute kidney injury) ([place])   . Acute renal failure ([place])   . Benign prostatic hyperplasia   . Obstructive sleep apnea treated with continuous positive airway pressure (CPAP)   . Hypotension   . S/P TKR (total knee replacement) using cement 01/16/2021  . Pain in left knee 04/30/2020  . Osteoarthritis of right knee 05/11/2018  . 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 Pulmonary HTN ([place]) 10/30/2016  . Hyperlipidemia  01/30/2016  . Secondary hypertension, unspecified 01/30/2016  . Type 2 diabetes mellitus without complication, without long-term current use of insulin ([place]) 01/30/2016  . Hypogonadism, male 01/30/2016  . Low testosterone 01/30/2016  . Essential hypertension 01/30/2016  . Obesity (BMI 30-39.9) 01/30/2016  . PAD (peripheral artery disease) ([place]) 01/30/2016  . CAD (coronary artery disease) 01/30/2016  . History of antineoplastic chemotherapy 06/30/2012  . History of radiation therapy 06/30/2012  . 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 (HCC) 06/30/2012    Orientation RESPIRATION BLADDER Height & Weight     Self,Time,Situation,Place  Normal Continent Weight: 221 lb 5.5 oz (100.4 kg) Height:  5\' 8"  (172.7 cm)  BEHAVIORAL SYMPTOMS/MOOD NEUROLOGICAL BOWEL NUTRITION STATUS      Continent Diet  AMBULATORY STATUS COMMUNICATION OF NEEDS Skin   Limited Assist Verbally Normal                       Personal Care Assistance Level of Assistance  Bathing,Feeding,Total care,Dressing Bathing Assistance: Limited assistance Feeding assistance: Limited assistance Dressing Assistance: Limited assistance Total Care Assistance: Limited assistance   Functional Limitations Info  Sight,Hearing,Speech Sight Info: Adequate   Speech Info: Adequate    SPECIAL CARE FACTORS FREQUENCY  OT (By licensed OT),PT (By licensed PT)     PT Frequency: 5X per week OT Frequency: 5X per week            Contractures Contractures Info: Not present    Additional Factors Info  Code Status,Allergies,Insulin Sliding Scale Code Status Info: Full Allergies Info: see MAR  Current Medications (02/03/2021):  This is the current hospital active medication list Current Facility-Administered Medications  Medication Dose Route Frequency Provider Last Rate Last Admin  . (feeding supplement) PROSource Plus liquid 30 mL  30 mL Oral BID BM Milus Banister, MD   30 mL at 02/03/21 1317  . 0.9 %  sodium chloride infusion   Intravenous Continuous Milus Banister, MD 10 mL/hr at 02/03/21 1513 Infusion Verify at 02/03/21 1513  . acetaminophen (TYLENOL) tablet 650 mg  650 mg Oral Q6H PRN Tawni Millers, MD   650 mg at 02/03/21 1401  . allopurinol (ZYLOPRIM) tablet 100 mg  100 mg Oral QHS Susa Day, MD   100 mg at 02/02/21 2114  . alum & mag hydroxide-simeth (MAALOX/MYLANTA) 200-200-20 MG/5ML suspension 30 mL  30 mL Oral Q6H PRN Milus Banister, MD   30 mL at 02/02/21 1821  . amiodarone (PACERONE) tablet 200 mg  200 mg Oral Daily Donato Heinz, MD   200 mg at 02/03/21 1113  . aspirin EC tablet 81 mg  81 mg Oral Daily Susa Day, MD      . bisacodyl (DULCOLAX) suppository 10 mg  10 mg Rectal Daily Milus Banister, MD   10 mg at 01/30/21 0943  . Chlorhexidine Gluconate Cloth 2 % PADS 6 each  6 each Topical Daily Milus Banister, MD   6 each at 02/02/21 2100  . diphenhydrAMINE (BENADRYL) injection 12.5-25 mg  12.5-25 mg Intravenous Q6H PRN Milus Banister, MD      . docusate sodium (COLACE) capsule 100 mg  100 mg Oral Daily Milus Banister, MD   100 mg at 02/02/21 0949  . feeding supplement (BOOST / RESOURCE BREEZE) liquid 1 Container  1 Container Oral BID BM Milus Banister, MD   1 Container at 02/02/21 1414  . fluconazole (DIFLUCAN) IVPB 100 mg  100 mg Intravenous Q24H Milus Banister, MD   Stopped at 02/03/21 1415  . furosemide (LASIX) injection 40 mg  40 mg Intravenous BID Deboraha Sprang, MD      . HYDROmorphone (DILAUDID) injection 0.5 mg  0.5 mg Intravenous Q4H PRN Milus Banister, MD      . insulin aspart (novoLOG) injection 0-15 Units  0-15 Units Subcutaneous Q4H Milus Banister, MD   3 Units at 02/03/21 1316  . lip balm (CARMEX) ointment 1 application  1 application Topical BID Milus Banister, MD   1 application at 94/76/54 1114  . magic mouthwash  15 mL Oral QID PRN Milus Banister, MD   15 mL at 01/26/21  1838  . MEDLINE mouth rinse  15 mL Mouth Rinse BID Milus Banister, MD   15 mL at 02/03/21 1114  . menthol-cetylpyridinium (CEPACOL) lozenge 3 mg  1 lozenge Oral PRN Milus Banister, MD      . metoCLOPramide [facility]) injection 5 mg  5 mg Intravenous Q8H Milus Banister, MD   5 mg at 02/03/21 1319  . metoprolol succinate (TOPROL-XL) 24 hr tablet 25 mg  25 mg Oral BID Deboraha Sprang, MD   25 mg at 02/03/21 1112  . metoprolol tartrate (LOPRESSOR) injection 2.5-5 mg  2.5-5 mg Intravenous Q3H PRN Milus Banister, MD   5 mg at 01/25/21 6503  . naphazoline-glycerin (CLEAR EYES REDNESS) ophth solution 1-2 drop  1-2 drop Both Eyes QID PRN Milus Banister, MD      . pantoprazole (PROTONIX) EC tablet 40 mg  40 mg Oral BID Tawni Millers, MD   40 mg at 02/03/21 1318  . phenol (CHLORASEPTIC) mouth spray 2 spray  2 spray Mouth/Throat PRN Milus Banister, MD      . sodium chloride (OCEAN) 0.65 % nasal spray 1-2 spray  1-2 spray Each Nare Q6H PRN Milus Banister, MD      . sodium chloride flush (NS) 0.9 % injection 10-40 mL  10-40 mL Intracatheter Q12H Milus Banister, MD   10 mL at 02/03/21 1115  . sodium chloride flush (NS) 0.9 % injection 10-40 mL  10-40 mL Intracatheter PRN Milus Banister, MD   30 mL at 02/02/21 2113  . TPN ADULT (ION)   Intravenous Continuous TPN Randa Spike, RPH 80 mL/hr at 02/03/21 1513 Infusion Verify at 02/03/21 1513  . TPN ADULT (ION)   Intravenous Continuous TPN Shade, Haze Justin, RPH      . traMADol (ULTRAM) tablet 50 mg  50 mg Oral Q6H PRN Arrien, Jimmy Picket, MD      . trimethobenzamide Sherre Poot) injection 200 mg  200 mg Intramuscular Q6H PRN Milus Banister, MD         Discharge Medications: Please see discharge summary for a list of discharge medications.  Relevant Imaging Results:  Relevant Lab Results:   Additional Information SS# [id number]  Adelene Amas, LCSWA

## 2021-02-03 NOTE — Progress Notes (Signed)
Inpatient Rehab Admissions Coordinator:   I spoke with Pt. And wife over the phone to discuss potential CIR admit. They declined CIR and state that they want Pt. To complete his rehab in a SNF in [place]. They state that their son is a Mudlogger of a SNF close to their home and they wish to go there. I will let TOC know and CIR will sign off at this time.   Clemens Catholic, [place], [place] Admissions Coordinator  [phone] ([place]) [phone] (office)

## 2021-02-03 NOTE — Progress Notes (Addendum)
PROGRESS NOTE    [name]  KZS:[id number] DOB: 12-22-1945 DOA: 01/16/2021 PCP: Bing Neighbors, NP    Brief Narrative:  [name] was admitted to the hospital with working diagnosis of left knee osteoarthritis, admitted for total knee replacement.  Complicated by small bowel obstruction, postoperative ileus, upper GI bleed, esophageal candidiasis, acute kidney injury, heart failure and atrial fibrillation.  75 year old male past medical history for hypertension, type II diabetes mellitus, peripheral artery disease, coronary artery disease, dyslipidemia and BPH who was admitted for left knee pain.  Patient was admitted to the hospital for total knee replacement, on day 3 postoperative, patient developed increasing nausea and poor appetite.  05/15 Patient developed hypotension and was transferred to stepdown unit.  He was diagnosed with ileus and partial small bowel obstruction. Central venous catheter was placed and started on vasopressors.  He was diagnosed with acute hypoxic respiratory failure due to aspiration pneumonitis.   His hospitalization was complicated by atrial fibrillation requiring amiodarone and heparin drip. On 5/20 he had coffee-ground emesis through NG tube.  Required 2 units packed red blood cell transfusion and heparin drip was discontinued 05/21.  On 5/23 and 5/25 he required PRBC transfusion adding a total of 4 packed red blood cells in total.  Underwent EGD 5/26, finding esophagitis due to Candida.  On 5/27 his NG tube was removed.  Diet advanced with good toleration.   Assessment & Plan:   Principal Problem:   Ileus ([place]) Active Problems:   Type 2 diabetes mellitus without complication, without long-term current use of insulin (HCC)   Essential hypertension   Obesity (BMI 30-39.9)   CAD (coronary artery disease)   Pulmonary HTN (HCC)   S/P TKR (total knee replacement) using cement   Acute respiratory distress   AKI (acute kidney injury) ([place])    Acute renal failure (HCC)   Benign prostatic hyperplasia   Obstructive sleep apnea treated with continuous positive airway pressure (CPAP)   Hypotension   GERD (gastroesophageal reflux disease)   Protein-calorie malnutrition, moderate (HCC)   Gastrointestinal bleed   PAF (paroxysmal atrial fibrillation) (HCC)   Acute blood loss anemia   History of gastritis   Irritable bowel syndrome with intermittent diarrhea   SOB (shortness of breath)   Anasarca   1. Left knee osteoarthritis sp total knee replacement. 05/11.  Continue pain control and physical therapy.  Post op care per orthopedic surgery team.   2. Postoperative ileus with partial small bowel obstruction. Patient with no nausea or vomiting and tolerating po well. Patient passing gas and positive bowel movements.  Has been tolerating well full liquid diet.   Will advance diet to soft and will start to wean off TPN.  Continue with antiemetics  3.AKI on CKD stage 3a/ hypokalemia. Stable renal function with serum cr at 1.56 with K at 4.4 and serum bicarbonate at 30. Patient is tolerating po well. Off IV fluids, continue taper off TPN.   Plan to hold on furosemide for now.   4. Acute on chronic diastolic heart failure, atrial fibrillation with RVR, prolonged Qtc.  Clinically with improved volume status, rhythm has converted to sinus rhythm. Personally reviewed telemetry. Continue rate control with amiodarone, hold on anticoagulation due to acute bleeding.   Ok to transfer patient to telemetry.  Continue with metoprolol Xl, Ok to discontinue furosemide.   5. Acute blood loss anemia due to candida esophagitis (upper GI bleed)/ GERD. Hgb has been stable at 8,1. Continue PPI for esophagitis, patient is tolerating diet  well.   Ok to use low dose enteric coated aspirin if needed. Change pantoprazole from IV to PO, continue bid for now.    6. Aspiration pneumonitis/ right lower lobe pneumonia, with acute hypoxemic respiratory  failure. Oxygenating well at 93% on room air, no dyspnea or chest pain.  Patient now has completed antibiotic therapy.   7. Acute metabolic encephalopathy. Clinically resolved, patient is awake and alert.   8. Pre-diabetes. Fasting glucose is 133. Continue close monitoring.   Patient continue to be at high risk for further medical complications   Status is: Inpatient  Remains inpatient appropriate because:Inpatient level of care appropriate due to severity of illness   Dispo:  Patient From: Home  Planned Disposition: Inpatient Rehab  Medically stable for discharge: No     DVT prophylaxis: scd   Code Status:   full  Family Communication:  I spoke with patient's wife and son at the bedside, we talked in detail about patient's condition, plan of care and prognosis and all questions were addressed.      Nutrition Status: Nutrition Problem: Inadequate oral intake Etiology: inability to eat Signs/Symptoms: NPO status Interventions: Boost Breeze,Prostat,TPN    Procedures:   Total knee arthroplasty     Subjective: Patient is feeling better, no confusion or agitation, no nausea or vomiting, no chest pain or dyspnea.   Objective: Vitals:   02/03/21 0600 02/03/21 0606 02/03/21 0700 02/03/21 0800  BP:  (!) 123/44    Pulse: 66 66 71 77  Resp: (!) 26 (!) 29 (!) 24 13  Temp:  98 F (36.7 C)  98.6 F (37 C)  TempSrc:  Oral  Oral  SpO2: 96% 96% 96% 98%  Weight:      Height:        Intake/Output Summary (Last 24 hours) at 02/03/2021 0841 Last data filed at 02/03/2021 0800 Gross per 24 hour  Intake 2979.66 ml  Output 3450 ml  Net -470.34 ml   Filed Weights   01/30/21 1027 01/31/21 0500 02/02/21 0420  Weight: 112.2 kg 108.5 kg 100.4 kg    Examination:   General: Not in pain or dyspnea, deconditioned  Neurology: Awake and alert, non focal  E ENT: mild pallor, no icterus, oral mucosa moist Cardiovascular: No JVD. S1-S2 present, rhythmic, no gallops, rubs, or  murmurs. No lower extremity edema. Pulmonary:  Positive breath sounds bilaterally, adequate air movement, no wheezing, rhonchi or rales. Gastrointestinal. Abdomen soft and non tender Skin. No rashes Musculoskeletal: left leg with orthopedic device in place.      Data Reviewed: I have personally reviewed following labs and imaging studies  CBC: Recent Labs  Lab 01/28/21 0422 01/28/21 1516 01/29/21 0914 01/29/21 1800 01/30/21 0247 01/30/21 1006 01/30/21 2203 01/31/21 0242 01/31/21 0511 02/01/21 0259 02/02/21 0359  WBC 14.3*  --  11.2*  --  10.8*  --   --   --  10.0 8.0 7.4  NEUTROABS 11.6*  --   --   --   --   --   --   --   --   --   --   HGB 7.1*   < > 8.5*   < > 7.7*  7.7*   < > 8.0* 8.3* 8.5* 8.0* 8.1*  HCT 22.5*   < > 27.1*   < > 24.9*  24.9*   < > 23.9* 25.7* 26.4* 25.5* 25.9*  MCV 96.6  --  95.1  --  96.5  --   --   --  94.3 94.1 94.9  PLT 159  --  189  --  202  --   --   --  202 231 263   < > = values in this interval not displayed.   Basic Metabolic Panel: Recent Labs  Lab 01/30/21 0247 01/31/21 0242 02/01/21 0259 02/02/21 0359 02/03/21 0608  NA 141 144 145 142 138  K 4.9 3.7 4.0 3.6 4.4  CL 113* 113* 113* 104 102  CO2 21* 22 27 29 30   GLUCOSE 164* 118* 140* 132* 133*  BUN 98* 98* 87* 69* 60*  CREATININE 1.80* 1.81* 1.71* 1.51* 1.56*  CALCIUM 8.3* 8.3* 8.5* 8.6* 8.7*  MG 2.2 2.3 2.2 2.0 2.1  PHOS 4.2 3.9 3.8 4.0 4.5   GFR: Estimated Creatinine Clearance: 47 mL/min (A) (by C-G formula based on SCr of 1.56 mg/dL (H)). Liver Function Tests: Recent Labs  Lab 01/28/21 0422 01/31/21 0242 02/02/21 0359  AST 41 29 29  ALT 49* 72* 55*  ALKPHOS 46 55 66  BILITOT 0.8 1.2 0.9  PROT 5.0* 5.0* 5.5*  ALBUMIN 2.3* 2.0* 2.2*   No results for input(s): LIPASE, AMYLASE in the last 168 hours. Recent Labs  Lab 01/30/21 1106 01/31/21 0510  AMMONIA 71* 21   Coagulation Profile: No results for input(s): INR, PROTIME in the last 168 hours. Cardiac  Enzymes: No results for input(s): CKTOTAL, CKMB, CKMBINDEX, TROPONINI in the last 168 hours. BNP (last 3 results) No results for input(s): PROBNP in the last 8760 hours. HbA1C: No results for input(s): HGBA1C in the last 72 hours. CBG: Recent Labs  Lab 02/02/21 1700 02/02/21 1923 02/02/21 2323 02/03/21 0649 02/03/21 0809  GLUCAP 149* 162* 147* 129* 147*   Lipid Profile: No results for input(s): CHOL, HDL, LDLCALC, TRIG, CHOLHDL, LDLDIRECT in the last 72 hours. Thyroid Function Tests: No results for input(s): TSH, T4TOTAL, FREET4, T3FREE, THYROIDAB in the last 72 hours. Anemia Panel: No results for input(s): VITAMINB12, FOLATE, FERRITIN, TIBC, IRON, RETICCTPCT in the last 72 hours.    Radiology Studies: I have reviewed all of the imaging during this hospital visit personally     Scheduled Meds: . (feeding supplement) PROSource Plus  30 mL Oral BID BM  . allopurinol  100 mg Oral QHS  . amiodarone  200 mg Oral Daily  . bisacodyl  10 mg Rectal Daily  . Chlorhexidine Gluconate Cloth  6 each Topical Daily  . docusate sodium  100 mg Oral Daily  . feeding supplement  1 Container Oral BID BM  . furosemide  40 mg Intravenous BID  . insulin aspart  0-15 Units Subcutaneous Q4H  . lip balm  1 application Topical BID  . mouth rinse  15 mL Mouth Rinse BID  . metoCLOPramide (REGLAN) injection  5 mg Intravenous Q8H  . metoprolol succinate  25 mg Oral BID  . pantoprazole  40 mg Intravenous Q12H  . sodium chloride flush  10-40 mL Intracatheter Q12H   Continuous Infusions: . sodium chloride 10 mL/hr at 02/03/21 0653  . fluconazole (DIFLUCAN) IV Stopped (02/02/21 1307)  . TPN ADULT (ION) 80 mL/hr at 02/03/21 0653     LOS: 18 days        Angellynn Kimberlin Gerome Apley, MD

## 2021-02-03 NOTE — Progress Notes (Signed)
Occupational Therapy Treatment Note  Spouse inform therapy patient done on bedside commode. Patient needing repeated cues to push from commode vs pulling on walker to stand and min A x2 to power up. PT providing min A for safety and patient needing bilateral upper extremity support while OT total A for perianal care after loose bowel movement. With min x2 patient able to ambulate ~33ft over to recliner chair, patient appears almost anxious at times still attempting to sit quickly and reaching back with unilateral upper extremity support. Patient states "oh lordy" when asked if in pain patient states "just very tired." Patient left with PT in recliner chair at end of session. Patient showing improvement needing less overall physical assistance for functional mobility however limited by generalized weakness. Updated D/C recommendations to CIR as patient motivated to participate and return home with spouse.     02/03/21 1300  OT Visit Information  Last OT Received On 02/03/21  Assistance Needed +2  PT/OT/SLP Co-Evaluation/Treatment Yes  Reason for Co-Treatment For patient/therapist safety;To address functional/ADL transfers  OT goals addressed during session ADL's and self-care  History of Present Illness Pt is a 75 year old male s/p left TKA on 01/16/21. Transferred to ICU on 5/15 due to respiratory failure, ileus, kidney failure  Precautions  Precautions Fall;Knee  Precaution Comments monitor vitals  Required Braces or Orthoses Knee Immobilizer - Left  Knee Immobilizer - Left On when out of bed or walking  Pain Assessment  Pain Assessment Faces  Faces Pain Scale 2  Pain Location L knee  Pain Descriptors / Indicators Sore  Pain Intervention(s) Monitored during session  Cognition  Arousal/Alertness Awake/alert  Behavior During Therapy WFL for tasks assessed/performed  Overall Cognitive Status Impaired/Different from baseline  Area of Impairment Following commands  Following Commands Follows  one step commands inconsistently;Follows one step commands with increased time  General Comments decreased carry over of cues to reach back with B UEs in sitting, appears almost anxious at times and states "oh lordy"  ADL  Overall ADL's  Needs assistance/impaired  Toilet Transfer Minimal assistance;+2 for physical assistance;+2 for safety/equipment;Ambulation;RW;Cueing for safety;Cueing for sequencing  Toilet Transfer Details (indicate cue type and reason) cues to push from bedside commode to stand vs walker, able to ambulate ~ 5 feet from commode to recliner chair with min x2 for stability and cues sequencing with walker. Again reaching back with only 1 UE and trying to sit quickly into chair, reports fatigue.  Toileting- Clothing Manipulation and Hygiene Total assistance;Sit to/from stand  Toileting - Clothing Manipulation Details (indicate cue type and reason) patient reliant on UE support and min A from PT in standing while OT perform total A perianal care after loose bowel movement  Functional mobility during ADLs Minimal assistance;+2 for physical assistance;+2 for safety/equipment;Cueing for safety;Cueing for sequencing;Rolling walker  Balance  Standing balance support During functional activity;Bilateral upper extremity supported  Standing balance-Leahy Scale Poor  Standing balance comment reliant on UEs and external assistance  Restrictions  LLE Weight Bearing WBAT  Transfers  Overall transfer level Needs assistance  Equipment used Rolling walker (2 wheeled)  Transfers Sit to/from Stand  Sit to Stand Min assist;+2 physical assistance;+2 safety/equipment  General transfer comment with cues to push from commode patient min x2 to power up to standing. patient able to ambulate ~5 ft from commode to recliner chair needing min x2 for stability. limited carry over of cues to reach back with B UEs for improved eccentric control into chair.  OT - End  of Session  Equipment Utilized During  Treatment Gait belt;Rolling walker;Left knee immobilizer  Activity Tolerance Patient tolerated treatment well  Patient left in chair;Other (comment) (physical therapy present)  Nurse Communication Mobility status  OT Assessment/Plan  OT Plan Discharge plan remains appropriate  OT Visit Diagnosis Unsteadiness on feet (R26.81);Other abnormalities of gait and mobility (R26.89);Muscle weakness (generalized) (M62.81);Pain  Pain - Right/Left Left  Pain - part of body Knee  OT Frequency (ACUTE ONLY) Min 2X/week  Follow Up Recommendations CIR  OT Equipment 3 in 1 bedside commode (delivered to pt room)  AM-PAC OT "6 Clicks" Daily Activity Outcome Measure (Version 2)  Help from another person eating meals? 4  Help from another person taking care of personal grooming? 3  Help from another person toileting, which includes using toliet, bedpan, or urinal? 2  Help from another person bathing (including washing, rinsing, drying)? 2  Help from another person to put on and taking off regular upper body clothing? 3  Help from another person to put on and taking off regular lower body clothing? 1  6 Click Score 15  OT Goal Progression  Progress towards OT goals Progressing toward goals  Acute Rehab OT Goals  Patient Stated Goal to go home to wife  OT Goal Formulation With patient  Time For Goal Achievement 02/05/21  Potential to Achieve Goals Good  ADL Goals  Pt Will Perform Lower Body Dressing with adaptive equipment;sit to/from stand;sitting/lateral leans;with mod assist  Pt Will Transfer to Toilet with mod assist;ambulating;bedside commode (walker)  Pt Will Perform Toileting - Clothing Manipulation and hygiene with mod assist;sitting/lateral leans;sit to/from stand  OT Time Calculation  OT Start Time (ACUTE ONLY) 1232  OT Stop Time (ACUTE ONLY) 1240  OT Time Calculation (min) 8 min  OT General Charges  $OT Visit 1 Visit  OT Treatments  $Self Care/Home Management  8-22 mins   Delbert Phenix  OT OT pager: [phone]

## 2021-02-03 NOTE — TOC Progression Note (Signed)
Transition of Care [facility]) - Progression Note    Patient Details  Name: [name] MRN: [id number] Date of Birth: [birth year]  Transition of Care [facility]) CM/SW [place], [place] Phone Number: [phone] 02/03/2021, 3:54 PM  Clinical Narrative:     CSW received updated patient would like to go to Inpatient Rehab or SNF in [place].  CSW spoke with patient's spouse Sigifredo, [place] (Spouse) [phone] who stated their preference was at [facility], which has Inpatient Rehab placement or [facility].  Ms. Caraway requested CSW contact their son Suhaas Agena [phone] for more information in placement.  CSW spoke w/ Peyton Najjar who stated he works at [employer] and would be able to assist with placement, and id insurance did not authorization inpatient rehab placement then his wife Layla Maw is an Surveyor, quantity at [employer], bot in [place], [place].  Mr. Mccadden requested CSW fax FL2, Facesheet, H&P, most recent PT/OT notes and MAR, to the attention of Marge/Terry [phone] at [facility], and to the attention of Layla Maw [phone].  Expected Discharge Plan: [place] Barriers to Discharge: Barriers Resolved  Expected Discharge Plan and Services Expected Discharge Plan: [place] In-house Referral: Clinical Social Work   Post Acute Care Choice: [place] Living arrangements for the past 2 months: [place] Expected Discharge Date: 01/20/21               DME Arranged: 3-N-1,Walker rolling DME Agency: [employer] Date DME Agency Contacted: 01/19/21 Time DME Agency Contacted: [phone] Representative spoke with at DME Agency: [place]: PT [place]: [facility] [place] office) Date [place]: 01/18/21 Time [place]: 1000 Representative spoke with at [place] (ph# [phone]; fax # [phone])   Social Determinants of  Health ([place]) Interventions    Readmission Risk Interventions Readmission Risk Prevention Plan 01/17/2021  Transportation Screening Complete  PCP or Specialist Appt within 5-7 Days Complete  Home Care Screening Complete  Some recent data might be hidden

## 2021-02-03 NOTE — Progress Notes (Signed)
Occupational Therapy Treatment Note  Upon arrival patient semi-supine in bed, agreeable to therapy. Unsure may be partially related to [facility] however patient needing increased time and repeated directions to initiate bed mobility looking to spouse "what do I do?" Patient needing mod A for trunk support and management of L LE to edge of bed. Patient attempting to use urinal at edge of bed, then reporting need for bowel movement. With bed height elevated and cues for hand placement patient min x2 to power up to standing and stabilize. Patient needing cues for sequencing with walker to take few steps over to bedside commode and assist for eccentric control as patient sits quickly and only reaching one hand back in sitting. Patient remain seated on commode with spouse present and instructed to call bell when patient finished.    02/03/21 1200  OT Visit Information  Last OT Received On 02/03/21  Assistance Needed +2  PT/OT/SLP Co-Evaluation/Treatment Yes  Reason for Co-Treatment For patient/therapist safety  OT goals addressed during session ADL's and self-care  History of Present Illness Pt is a 75 year old male s/p left TKA on 01/16/21. Transferred to ICU on 5/15 due to respiratory failure, ileus, kidney failure  Precautions  Precautions Fall;Knee  Precaution Comments monitor vitals  Required Braces or Orthoses Knee Immobilizer - Left  Knee Immobilizer - Left On when out of bed or walking  Pain Assessment  Pain Assessment Faces  Faces Pain Scale 2  Pain Location L knee with bed mobility  Pain Descriptors / Indicators Sore  Pain Intervention(s) Monitored during session  Cognition  Arousal/Alertness Awake/alert  Behavior During Therapy WFL for tasks assessed/performed  Overall Cognitive Status Impaired/Different from baseline  Area of Impairment Following commands  Following Commands Follows one step commands with increased time  General Comments patient very pleasant however does need increased  time and asked at one point "so now what?" and looking to spouse after being asked to sit up at edge of bed. may be related to [facility] as well  ADL  Overall ADL's  Needs assistance/impaired  Lower Body Dressing Total assistance;Bed level  Lower Body Dressing Details (indicate cue type and reason) to don socks  Toilet Transfer Minimal assistance;+2 for physical assistance;+2 for safety/equipment;Cueing for safety;Cueing for sequencing;BSC;RW  Toilet Transfer Details (indicate cue type and reason) min x2 to power up to standing from elevated bed height, needs cues for hand placement including reaching back with UEs to control descent onto commode. reports fatigue with transfer and taking few steps to commode  Functional mobility during ADLs Minimal assistance;+2 for physical assistance;+2 for safety/equipment;Cueing for safety;Cueing for sequencing;Rolling walker  Bed Mobility  Overal bed mobility Needs Assistance  Bed Mobility Supine to Sit  Supine to sit Mod assist;HOB elevated  General bed mobility comments assist with trunk support and bringing L LE toward EOB  Balance  Overall balance assessment Needs assistance  Sitting-balance support Feet supported;Single extremity supported  Sitting balance-Leahy Scale Poor  Sitting balance - Comments UE support  Standing balance support During functional activity;Bilateral upper extremity supported  Standing balance-Leahy Scale Poor  Standing balance comment reliant on UEs and external assistance  Restrictions  LLE Weight Bearing WBAT  Transfers  Overall transfer level Needs assistance  Equipment used Rolling walker (2 wheeled)  Transfers Sit to/from [employer] Transfers  Sit to Stand Min assist;+2 physical assistance;+2 safety/equipment;From elevated surface  Stand pivot transfers Min assist;+2 physical assistance;+2 safety/equipment  General transfer comment with bed height elevated and cues for hand  placement patient able to power up to  standing with min x2. assist to steady before taking steps to bedside commode, unsteady and needing cues for safety  OT - End of Session  Equipment Utilized During Treatment Gait belt;Rolling walker;Left knee immobilizer  Activity Tolerance Patient tolerated treatment well  Patient left Other (comment);with family/visitor present (on bedside commode)  Nurse Communication Mobility status  OT Assessment/Plan  OT Plan Discharge plan needs to be updated  OT Visit Diagnosis Unsteadiness on feet (R26.81);Other abnormalities of gait and mobility (R26.89);Muscle weakness (generalized) (M62.81);Pain  Pain - Right/Left Left  Pain - part of body Knee  OT Frequency (ACUTE ONLY) Min 2X/week  Follow Up Recommendations CIR  OT Equipment 3 in 1 bedside commode (delivered to pt room)  AM-PAC OT "6 Clicks" Daily Activity Outcome Measure (Version 2)  Help from another person eating meals? 4  Help from another person taking care of personal grooming? 3  Help from another person toileting, which includes using toliet, bedpan, or urinal? 2  Help from another person bathing (including washing, rinsing, drying)? 2  Help from another person to put on and taking off regular upper body clothing? 3  Help from another person to put on and taking off regular lower body clothing? 1  6 Click Score 15  OT Goal Progression  Progress towards OT goals Progressing toward goals  Acute Rehab OT Goals  Patient Stated Goal to go home to wife  OT Goal Formulation With patient  Time For Goal Achievement 02/05/21  Potential to Achieve Goals Good  ADL Goals  Pt Will Perform Lower Body Dressing with adaptive equipment;sit to/from stand;sitting/lateral leans;with mod assist  Pt Will Transfer to Toilet with mod assist;ambulating;bedside commode (walker)  Pt Will Perform Toileting - Clothing Manipulation and hygiene with mod assist;sitting/lateral leans;sit to/from stand  OT Time Calculation  OT Start Time (ACUTE ONLY) 1203   OT Stop Time (ACUTE ONLY) 1226  OT Time Calculation (min) 23 min  OT General Charges  $OT Visit 1 Visit  OT Treatments  $Self Care/Home Management  8-22 mins   Delbert Phenix OT OT pager: [phone]

## 2021-02-03 NOTE — Progress Notes (Signed)
Subjective: 3 Days Post-Op Procedure(s) (LRB): ESOPHAGOGASTRODUODENOSCOPY (EGD) WITH PROPOFOL (N/A) Patient reports pain as 3 on 0-10 scale.   Denies CP or SOB.  Voiding without difficulty. Positive flatus. Slight bleed a few days ago after washing  Objective: Vital signs in last 24 hours: Temp:  [97.6 F (36.4 C)-98.9 F (37.2 C)] 98 F (36.7 C) (05/29 0606) Pulse Rate:  [65-80] 77 (05/29 0800) Resp:  [13-29] 13 (05/29 0800) BP: (123-150)/(44-126) 123/44 (05/29 0606) SpO2:  [93 %-99 %] 98 % (05/29 0800)  Intake/Output from previous day: 05/28 0701 - 05/29 0700 In: 3219.7 [P.O.:840; I.V.:2129.7; IV Piggyback:250] Out: 3800 [Urine:3800] Intake/Output this shift: No intake/output data recorded.  Recent Labs    02/01/21 0259 02/02/21 0359  HGB 8.0* 8.1*   Recent Labs    02/01/21 0259 02/02/21 0359  WBC 8.0 7.4  RBC 2.71* 2.73*  HCT 25.5* 25.9*  PLT 231 263   Recent Labs    02/02/21 0359 02/03/21 0608  NA 142 138  K 3.6 4.4  CL 104 102  CO2 29 30  BUN 69* 60*  CREATININE 1.51* 1.56*  GLUCOSE 132* 133*  CALCIUM 8.6* 8.7*   No results for input(s): LABPT, INR in the last 72 hours.  Neurologically intact Intact pulses distally Dorsiflexion/Plantar flexion intact Incision: no drainage No cellulitis present Compartment soft  Distal incision dried blood on steri   Assessment/Plan:  3 Days Post-Op Procedure(s) (LRB): ESOPHAGOGASTRODUODENOSCOPY (EGD) WITH PROPOFOL (N/A) Advance diet Up with therapy  Call into GI to see if he can resume asa for DVT prophy Watch distal incision   Principal Problem:   Ileus ([place]) Active Problems:   Type 2 diabetes mellitus without complication, without long-term current use of insulin (HCC)   Essential hypertension   Obesity (BMI 30-39.9)   CAD (coronary artery disease)   Pulmonary HTN (HCC)   S/P TKR (total knee replacement) using cement   Acute respiratory distress   AKI (acute kidney injury) ([place])   Acute renal  failure (HCC)   Benign prostatic hyperplasia   Obstructive sleep apnea treated with continuous positive airway pressure (CPAP)   Hypotension   GERD (gastroesophageal reflux disease)   Protein-calorie malnutrition, moderate (HCC)   Gastrointestinal bleed   PAF (paroxysmal atrial fibrillation) (HCC)   Acute blood loss anemia   History of gastritis   Irritable bowel syndrome with intermittent diarrhea   SOB (shortness of breath)   Anasarca      [name] 02/03/2021, @NOW 

## 2021-02-03 NOTE — Progress Notes (Signed)
Progress Note  Patient Name: [name] Date of Encounter: 02/03/2021  [facility] Cardiologist: Ida Rogue, MD   Patient Profile     [name] is a 75 y.o. 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  Course has been complicated on 03/16/6282 by dehydration and hypotension.  He subsequently developed an ileus and SBO and respiratory failure on 01/20/2021 due to aspiration.  VQ scan negative for PE.  He developed A. fib with RVR at that time.  Cardiology was consulted on 01/26/2021 for A. fib management in the setting of GI bleed and acute blood loss anemia.  Treated with amiodarone with subsequent reversion to sinus rhythm.  Anticoagulation deferred Also congestive heart failure with elevated right-sided pressures and elevated creatinine.  IV Lasix helped both 5/29  Diuretics held per hospitalists  Subjective      Inpatient Medications    Scheduled Meds: . (feeding supplement) PROSource Plus  30 mL Oral BID BM  . allopurinol  100 mg Oral QHS  . amiodarone  200 mg Oral Daily  . bisacodyl  10 mg Rectal Daily  . Chlorhexidine Gluconate Cloth  6 each Topical Daily  . docusate sodium  100 mg Oral Daily  . feeding supplement  1 Container Oral BID BM  . insulin aspart  0-15 Units Subcutaneous Q4H  . lip balm  1 application Topical BID  . mouth rinse  15 mL Mouth Rinse BID  . metoCLOPramide (REGLAN) injection  5 mg Intravenous Q8H  . metoprolol succinate  25 mg Oral BID  . pantoprazole  40 mg Oral BID  . sodium chloride flush  10-40 mL Intracatheter Q12H   Continuous Infusions: . sodium chloride 10 mL/hr at 02/03/21 0653  . fluconazole (DIFLUCAN) IV Stopped (02/02/21 1307)  . TPN ADULT (ION) 80 mL/hr at 02/03/21 0653  . TPN ADULT (ION)     PRN Meds: acetaminophen, alum & mag  hydroxide-simeth, diphenhydrAMINE, HYDROmorphone (DILAUDID) injection, magic mouthwash, menthol-cetylpyridinium, metoprolol tartrate, naphazoline-glycerin, phenol, sodium chloride, sodium chloride flush, trimethobenzamide   Vital Signs    Vitals:   02/03/21 0900 02/03/21 1000 02/03/21 1100 02/03/21 1112  BP:    (!) 136/50  Pulse: 74 69 77 77  Resp: 16 (!) 27 (!) 24   Temp:      TempSrc:      SpO2: 96% 96% 97%   Weight:      Height:        Intake/Output Summary (Last 24 hours) at 02/03/2021 1120 Last data filed at 02/03/2021 1000 Gross per 24 hour  Intake 2968.5 ml  Output 2750 ml  Net 218.5 ml   Last 3 Weights 02/02/2021 01/31/2021 01/30/2021  Weight (lbs) 221 lb 5.5 oz 239 lb 3.2 oz 247 lb 5.7 oz  Weight (kg) 100.4 kg 108.5 kg 112.2 kg      Telemetry    Telemetry Personally sinus   ECG       Physical Exam   Well developed and nourished in no acute distress HENT normal Neck supple with JVP 8 Clear Regular rate and rhythm, no murmurs or gallops Abd-soft with active BS No Clubbing cyanosis tr edema Skin-warm and dry A & Oriented  Grossly normal sensory and motor function      Labs    High Sensitivity Troponin:   Recent Labs  Lab 01/20/21 1220 01/20/21 2131  TROPONINIHS 21* 23*      Chemistry Recent Labs  Lab 01/28/21 0422 01/29/21 0402 01/31/21 0242 02/01/21 0259 02/02/21 0359 02/03/21 0608  NA 144   < > 144 145 142 138  K 4.5   < > 3.7 4.0 3.6 4.4  CL 116*   < > 113* 113* 104 102  CO2 23   < > 22 27 29 30   GLUCOSE 177*   < > 118* 140* 132* 133*  BUN 95*   < > 98* 87* 69* 60*  CREATININE 1.58*   < > 1.81* 1.71* 1.51* 1.56*  CALCIUM 8.5*   < > 8.3* 8.5* 8.6* 8.7*  PROT 5.0*  --  5.0*  --  5.5*  --   ALBUMIN 2.3*  --  2.0*  --  2.2*  --   AST 41  --  29  --  29  --   ALT 49*  --  72*  --  55*  --   ALKPHOS 46  --  55  --  66  --   BILITOT 0.8  --  1.2  --  0.9  --   GFRNONAA 45*   < > 39* 41* 48* 46*  ANIONGAP 5   < > 9 5 9 6    < > = values  in this interval not displayed.     Hematology Recent Labs  Lab 01/31/21 0511 02/01/21 0259 02/02/21 0359  WBC 10.0 8.0 7.4  RBC 2.80* 2.71* 2.73*  HGB 8.5* 8.0* 8.1*  HCT 26.4* 25.5* 25.9*  MCV 94.3 94.1 94.9  MCH 30.4 29.5 29.7  MCHC 32.2 31.4 31.3  RDW 18.0* 17.7* 17.2*  PLT 202 231 263    BNP Recent Labs  Lab 01/29/21 0402  BNP 69.1     DDimer No results for input(s): DDIMER in the last 168 hours.   Radiology    No results found.  Cardiac Studies   TTE 01/21/2021 1. Poor acoustic windows limit study even with Definity. Overall LVEF  appears normal with no signficant wall motion abnormalities . Left  ventricular ejection fraction, by estimation, is 50 to 55%. The left  ventricle has low normal function. There is  mild left ventricular hypertrophy.  2. Right ventricular systolic function is moderately reduced. The right  ventricular size is mildly enlarged. There is moderately elevated  pulmonary artery systolic pressure.  3. Trivial mitral valve regurgitation.  4. The aortic valve was not well visualized. Aortic valve regurgitation  is not visualized. Mild to moderate aortic valve sclerosis/calcification  is present, without any evidence of aortic stenosis.  5. The inferior vena cava is dilated in size with <50% respiratory  variability, suggesting right atrial pressure of 15 mmHg.   R/LHC 2018:  Dist LAD lesion, 20 %stenosed.  The left ventricular ejection fraction is 55-65% by visual estimate. Assessment: 1. Minimal coronary plaque 2. Normal LV function EF 55-60% 3. Very mildly elevated pulmonary pressures (much lower than echo) 4. 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 Will get CT chest to further evaluate anomalous pulmonary vein. Given Qp/Qs < 1.5 and lack of RV strain will likely continue to tolerate well.   Echocardiogram 2018: Study Conclusions   - Left ventricle: The  cavity size was normal. Wall thickness was  normal. Systolic function was normal. The estimated ejection  fraction was in the range of 50% to 55%. Wall motion was normal;  there were no regional  wall motion abnormalities. Doppler  parameters are consistent with abnormal left ventricular  relaxation (grade 1 diastolic dysfunction).  - Ventricular septum: The contour showed systolic flattening. These  changes are consistent with RV pressure overload.  - Left atrium: The atrium was mildly dilated.  - Right ventricle: The cavity size was moderately dilated. Wall  thickness was mildly increased. Systolic function was mildly  reduced.  - Pulmonary arteries: Systolic pressure was moderately to severely  increased. PA peak pressure: 60 mm Hg (S).  - Inferior vena cava: The vessel was dilated. The respirophasic  diameter changes were blunted (< 50%), consistent with elevated  central venous pressure.     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 continue Continue to hold on anticoagulation Diuretics held--still volume overloaded and dyspneic.  Will reach out to the hospitalists about resuming   At some point he will be a candidate for an SGLT2    For questions or updates, please contact [place] Please consult [email or web address] for contact info under        Signed, Virl Axe, MD  02/03/2021, 11:20 AM

## 2021-02-03 NOTE — Progress Notes (Signed)
Physical Therapy Treatment Patient Details Name: [name] MRN: [id number] DOB: 08/12/1946 Today's Date: 02/03/2021    History of Present Illness Pt is a 75 year old male s/p left TKA on 01/16/21. Transferred to ICU on 5/15 due to respiratory failure, ileus, kidney failure    PT Comments    Pt up from [facility] to ambulate limited distance to recliner.  Pt states too fatigued after significant BM to ambulate further but is agreeable to participate in therex.   Follow Up Recommendations  CIR     Equipment Recommendations  Rolling walker with 5" wheels    Recommendations for Other Services       Precautions / Restrictions Precautions Precautions: Fall;Knee Precaution Comments: monitor vitals Required Braces or Orthoses: Knee Immobilizer - Left Knee Immobilizer - Left: On when out of bed or walking Restrictions Weight Bearing Restrictions: No LLE Weight Bearing: Weight bearing as tolerated    Mobility  Bed Mobility Overal bed mobility: Needs Assistance Bed Mobility: Supine to Sit     Supine to sit: Mod assist;HOB elevated     General bed mobility comments: Pt on BSC at beginning of session and in chair at session end    Transfers Overall transfer level: Needs assistance Equipment used: Rolling walker (2 wheeled) Transfers: Sit to/from Stand Sit to Stand: Min assist;+2 physical assistance;+2 safety/equipment Stand pivot transfers: Min assist;+2 physical assistance;+2 safety/equipment       General transfer comment: with cues to push from commode patient min x2 to power up to standing. patient able to ambulate ~5 ft from commode to recliner chair needing min x2 for stability. limited carry over of cues to reach back with B UEs for improved eccentric control into chair.  Ambulation/Gait Ambulation/Gait assistance: Min assist;+2 physical assistance;+2 safety/equipment Gait Distance (Feet): 5 Feet Assistive device: Rolling walker (2 wheeled) Gait Pattern/deviations:  Step-to pattern;Decreased stance time - left;Trunk flexed;Decreased weight shift to left;Decreased stride length Gait velocity: decreased   General Gait Details: Cues for sequence, posture, safety awareness and position from RW.  Distance ltd by fatigue   Stairs             Wheelchair Mobility    Modified Rankin (Stroke Patients Only)       Balance Overall balance assessment: Needs assistance Sitting-balance support: Feet supported;Single extremity supported Sitting balance-Leahy Scale: Poor Sitting balance - Comments: UE support   Standing balance support: During functional activity;Bilateral upper extremity supported Standing balance-Leahy Scale: Poor Standing balance comment: reliant on UEs and external assistance                            Cognition Arousal/Alertness: Awake/alert Behavior During Therapy: WFL for tasks assessed/performed Overall Cognitive Status: Impaired/Different from baseline Area of Impairment: Following commands                       Following Commands: Follows one step commands inconsistently;Follows one step commands with increased time       General Comments: decreased carry over of cues to reach back with B UEs in sitting, appears almost anxious at times and states "oh lordy"      Exercises Total Joint Exercises Ankle Circles/Pumps: AROM;Both;Supine;20 reps Quad Sets: AROM;Both;Supine;15 reps Heel Slides: AAROM;Left;20 reps;Supine Hip ABduction/ADduction: AROM;Left;Supine;15 reps Straight Leg Raises: AAROM;Left;Supine;20 reps Goniometric ROM: -10 - 70 AAROM    General Comments        Pertinent Vitals/Pain Pain Assessment: Faces Faces Pain Scale: Hurts  little more Pain Location: L knee with therex Pain Descriptors / Indicators: Sore Pain Intervention(s): Limited activity within patient's tolerance;Monitored during session;Ice applied    Home Living                      Prior Function             PT Goals (current goals can now be found in the care plan section) Acute Rehab PT Goals Patient Stated Goal: to go home to wife PT Goal Formulation: With patient Time For Goal Achievement: 02/13/21 Potential to Achieve Goals: Fair Progress towards PT goals: Progressing toward goals    Frequency    Min 5X/week      PT Plan Current plan remains appropriate    Co-evaluation PT/OT/SLP Co-Evaluation/Treatment: Yes Reason for Co-Treatment: For patient/therapist safety PT goals addressed during session: Mobility/safety with mobility OT goals addressed during session: ADL's and self-care      AM-PAC PT "6 Clicks" Mobility   Outcome Measure  Help needed turning from your back to your side while in a flat bed without using bedrails?: A Lot Help needed moving from lying on your back to sitting on the side of a flat bed without using bedrails?: A Lot Help needed moving to and from a bed to a chair (including a wheelchair)?: A Lot Help needed standing up from a chair using your arms (e.g., wheelchair or bedside chair)?: A Lot Help needed to walk in hospital room?: A Lot Help needed climbing 3-5 steps with a railing? : Total 6 Click Score: 11    End of Session Equipment Utilized During Treatment: Gait belt Activity Tolerance: Patient limited by fatigue Patient left: in chair;with call bell/phone within reach;with family/visitor present Nurse Communication: Mobility status PT Visit Diagnosis: Other abnormalities of gait and mobility (R26.89)     Time: 1224-4975 PT Time Calculation (min) (ACUTE ONLY): 30 min  Charges:  $Therapeutic Exercise: 8-22 mins $Therapeutic Activity: 8-22 mins                     [place] Pager [phone] Office [phone]    [name] 02/03/2021, 2:30 PM

## 2021-02-03 NOTE — Progress Notes (Signed)
Physical Therapy Treatment Patient Details Name: [name] MRN: [id number] DOB: May 03, 1946 Today's Date: 02/03/2021    History of Present Illness Pt is a 75 year old male s/p left TKA on 01/16/21. Transferred to ICU on 5/15 due to respiratory failure, ileus, kidney failure    PT Comments    Pt cooperative and agreeable to PT/OT but on sitting at EOB requested use of urinal and then [facility].  Pt assist to [facility].     Follow Up Recommendations  CIR     Equipment Recommendations  Rolling walker with 5" wheels    Recommendations for Other Services       Precautions / Restrictions Precautions Precautions: Fall;Knee Precaution Comments: monitor vitals Required Braces or Orthoses: Knee Immobilizer - Left Knee Immobilizer - Left: On when out of bed or walking Restrictions Weight Bearing Restrictions: No LLE Weight Bearing: Weight bearing as tolerated    Mobility  Bed Mobility Overal bed mobility: Needs Assistance Bed Mobility: Supine to Sit     Supine to sit: Mod assist;HOB elevated     General bed mobility comments: assist with trunk support and bringing L LE toward EOB    Transfers Overall transfer level: Needs assistance Equipment used: Rolling walker (2 wheeled) Transfers: Sit to/from Stand Sit to Stand: Min assist;+2 physical assistance;+2 safety/equipment Stand pivot transfers: Min assist;+2 physical assistance;+2 safety/equipment       General transfer comment: with cues to push from commode patient min x2 to power up to standing. patient able to ambulate ~5 ft from commode to recliner chair needing min x2 for stability. limited carry over of cues to reach back with B UEs for improved eccentric control into chair.  Ambulation/Gait                 Stairs             Wheelchair Mobility    Modified Rankin (Stroke Patients Only)       Balance Overall balance assessment: Needs assistance Sitting-balance support: Feet supported;Single extremity  supported Sitting balance-Leahy Scale: Poor Sitting balance - Comments: UE support   Standing balance support: During functional activity;Bilateral upper extremity supported Standing balance-Leahy Scale: Poor Standing balance comment: reliant on UEs and external assistance                            Cognition Arousal/Alertness: Awake/alert Behavior During Therapy: WFL for tasks assessed/performed Overall Cognitive Status: Impaired/Different from baseline Area of Impairment: Following commands                       Following Commands: Follows one step commands inconsistently;Follows one step commands with increased time       General Comments: decreased carry over of cues to reach back with B UEs in sitting, appears almost anxious at times and states "oh lordy"      Exercises      General Comments        Pertinent Vitals/Pain Pain Assessment: Faces Faces Pain Scale: Hurts a little bit Pain Location: L knee Pain Descriptors / Indicators: Sore Pain Intervention(s): Limited activity within patient's tolerance;Monitored during session    Home Living                      Prior Function            PT Goals (current goals can now be found in the care plan section) Acute Rehab PT  Goals Patient Stated Goal: to go home to wife PT Goal Formulation: With patient Time For Goal Achievement: 02/13/21 Potential to Achieve Goals: Fair Progress towards PT goals: Progressing toward goals    Frequency    Min 5X/week      PT Plan Current plan remains appropriate    Co-evaluation PT/OT/SLP Co-Evaluation/Treatment: Yes Reason for Co-Treatment: For patient/therapist safety PT goals addressed during session: Mobility/safety with mobility OT goals addressed during session: ADL's and self-care      AM-PAC PT "6 Clicks" Mobility   Outcome Measure  Help needed turning from your back to your side while in a flat bed without using bedrails?: A  Lot Help needed moving from lying on your back to sitting on the side of a flat bed without using bedrails?: A Lot Help needed moving to and from a bed to a chair (including a wheelchair)?: A Lot Help needed standing up from a chair using your arms (e.g., wheelchair or bedside chair)?: A Lot Help needed to walk in hospital room?: A Lot Help needed climbing 3-5 steps with a railing? : Total 6 Click Score: 11    End of Session Equipment Utilized During Treatment: Gait belt Activity Tolerance: Patient limited by fatigue Patient left: Other (comment) (BSC) Nurse Communication: Mobility status PT Visit Diagnosis: Other abnormalities of gait and mobility (R26.89)     Time: 6384-6659 PT Time Calculation (min) (ACUTE ONLY): 23 min  Charges:  $Therapeutic Activity: 8-22 mins                     Debe Coder PT Acute Rehabilitation Services Pager [phone] Office [phone]    Charyl Minervini 02/03/2021, 2:22 PM

## 2021-02-04 ENCOUNTER — Inpatient Hospital Stay (HOSPITAL_COMMUNITY): Payer: Medicare Other

## 2021-02-04 DIAGNOSIS — I480 Paroxysmal atrial fibrillation: Secondary | ICD-10-CM | POA: Diagnosis not present

## 2021-02-04 DIAGNOSIS — R0603 Acute respiratory distress: Secondary | ICD-10-CM | POA: Diagnosis not present

## 2021-02-04 DIAGNOSIS — D62 Acute posthemorrhagic anemia: Secondary | ICD-10-CM | POA: Diagnosis not present

## 2021-02-04 DIAGNOSIS — N179 Acute kidney failure, unspecified: Secondary | ICD-10-CM | POA: Diagnosis not present

## 2021-02-04 DIAGNOSIS — I5031 Acute diastolic (congestive) heart failure: Secondary | ICD-10-CM | POA: Diagnosis not present

## 2021-02-04 DIAGNOSIS — K567 Ileus, unspecified: Secondary | ICD-10-CM | POA: Diagnosis not present

## 2021-02-04 LAB — CBC
HCT: 26.2 % — ABNORMAL LOW (ref 39.0–52.0)
Hemoglobin: 8.1 g/dL — ABNORMAL LOW (ref 13.0–17.0)
MCH: 29.3 pg (ref 26.0–34.0)
MCHC: 30.9 g/dL (ref 30.0–36.0)
MCV: 94.9 fL (ref 80.0–100.0)
Platelets: 311 10*3/uL (ref 150–400)
RBC: 2.76 MIL/uL — ABNORMAL LOW (ref 4.22–5.81)
RDW: 16.4 % — ABNORMAL HIGH (ref 11.5–15.5)
WBC: 9.1 10*3/uL (ref 4.0–10.5)
nRBC: 0 % (ref 0.0–0.2)

## 2021-02-04 LAB — GLUCOSE, CAPILLARY
Glucose-Capillary: 118 mg/dL — ABNORMAL HIGH (ref 70–99)
Glucose-Capillary: 129 mg/dL — ABNORMAL HIGH (ref 70–99)
Glucose-Capillary: 129 mg/dL — ABNORMAL HIGH (ref 70–99)
Glucose-Capillary: 137 mg/dL — ABNORMAL HIGH (ref 70–99)
Glucose-Capillary: 138 mg/dL — ABNORMAL HIGH (ref 70–99)
Glucose-Capillary: 142 mg/dL — ABNORMAL HIGH (ref 70–99)
Glucose-Capillary: 166 mg/dL — ABNORMAL HIGH (ref 70–99)

## 2021-02-04 LAB — COMPREHENSIVE METABOLIC PANEL
ALT: 49 U/L — ABNORMAL HIGH (ref 0–44)
AST: 27 U/L (ref 15–41)
Albumin: 2.4 g/dL — ABNORMAL LOW (ref 3.5–5.0)
Alkaline Phosphatase: 86 U/L (ref 38–126)
Anion gap: 8 (ref 5–15)
BUN: 61 mg/dL — ABNORMAL HIGH (ref 8–23)
CO2: 29 mmol/L (ref 22–32)
Calcium: 8.8 mg/dL — ABNORMAL LOW (ref 8.9–10.3)
Chloride: 100 mmol/L (ref 98–111)
Creatinine, Ser: 1.69 mg/dL — ABNORMAL HIGH (ref 0.61–1.24)
GFR, Estimated: 42 mL/min — ABNORMAL LOW (ref >60)
Glucose, Bld: 120 mg/dL — ABNORMAL HIGH (ref 70–99)
Potassium: 4.1 mmol/L (ref 3.5–5.1)
Sodium: 137 mmol/L (ref 135–145)
Total Bilirubin: 0.8 mg/dL (ref 0.3–1.2)
Total Protein: 5.9 g/dL — ABNORMAL LOW (ref 6.5–8.1)

## 2021-02-04 LAB — MAGNESIUM: Magnesium: 2.1 mg/dL (ref 1.7–2.4)

## 2021-02-04 LAB — DIFFERENTIAL
Abs Immature Granulocytes: 0.49 10*3/uL — ABNORMAL HIGH (ref 0.00–0.07)
Basophils Absolute: 0.1 10*3/uL (ref 0.0–0.1)
Basophils Relative: 1 %
Eosinophils Absolute: 0.3 10*3/uL (ref 0.0–0.5)
Eosinophils Relative: 3 %
Immature Granulocytes: 5 %
Lymphocytes Relative: 17 %
Lymphs Abs: 1.5 10*3/uL (ref 0.7–4.0)
Monocytes Absolute: 0.5 10*3/uL (ref 0.1–1.0)
Monocytes Relative: 6 %
Neutro Abs: 6.2 10*3/uL (ref 1.7–7.7)
Neutrophils Relative %: 68 %

## 2021-02-04 LAB — PREALBUMIN: Prealbumin: 18.5 mg/dL (ref 18–38)

## 2021-02-04 LAB — TRIGLYCERIDES: Triglycerides: 77 mg/dL (ref <150)

## 2021-02-04 LAB — PHOSPHORUS: Phosphorus: 5.3 mg/dL — ABNORMAL HIGH (ref 2.5–4.6)

## 2021-02-04 MED ORDER — ATORVASTATIN CALCIUM 10 MG PO TABS
20.0000 mg | ORAL_TABLET | Freq: Every day | ORAL | Status: DC
  Administered 2021-02-04 – 2021-02-06 (×3): 20 mg via ORAL
  Filled 2021-02-04 (×3): qty 2

## 2021-02-04 MED ORDER — FUROSEMIDE 10 MG/ML IJ SOLN
40.0000 mg | Freq: Once | INTRAMUSCULAR | Status: AC
  Administered 2021-02-04: 40 mg via INTRAVENOUS
  Filled 2021-02-04: qty 4

## 2021-02-04 MED ORDER — TRAZODONE HCL 50 MG PO TABS
25.0000 mg | ORAL_TABLET | Freq: Every evening | ORAL | Status: DC | PRN
  Administered 2021-02-04: 25 mg via ORAL
  Filled 2021-02-04: qty 1

## 2021-02-04 MED ORDER — FUROSEMIDE 40 MG PO TABS
40.0000 mg | ORAL_TABLET | Freq: Every day | ORAL | Status: DC
  Administered 2021-02-05: 40 mg via ORAL
  Filled 2021-02-04: qty 1

## 2021-02-04 NOTE — Progress Notes (Signed)
Progress Note  Patient Name: [name] Date of Encounter: 02/04/2021  [facility] Cardiologist: Ida Rogue, MD   Subjective   Patient states he feels weak and was hoping to resume his testosterone shots. Denies chest pain or SOB. Volume status markedly improved.   Remains in NSR this AM HR 60-70s Blood pressures stable 100-120s Cr 1.7-->1.5-->1.7 Net negative 3L; no wt recorded  Inpatient Medications    Scheduled Meds: . (feeding supplement) PROSource Plus  30 mL Oral BID BM  . allopurinol  100 mg Oral QHS  . amiodarone  200 mg Oral Daily  . aspirin EC  81 mg Oral Daily  . bisacodyl  10 mg Rectal Daily  . Chlorhexidine Gluconate Cloth  6 each Topical Daily  . docusate sodium  100 mg Oral Daily  . feeding supplement  1 Container Oral BID BM  . furosemide  40 mg Intravenous BID  . insulin aspart  0-15 Units Subcutaneous Q4H  . lip balm  1 application Topical BID  . mouth rinse  15 mL Mouth Rinse BID  . metoCLOPramide (REGLAN) injection  5 mg Intravenous Q8H  . metoprolol succinate  25 mg Oral BID  . pantoprazole  40 mg Oral BID  . sodium chloride flush  10-40 mL Intracatheter Q12H   Continuous Infusions: . sodium chloride 10 mL/hr at 02/03/21 2046  . fluconazole (DIFLUCAN) IV Stopped (02/03/21 1415)  . TPN ADULT (ION) 40 mL/hr at 02/03/21 1707   PRN Meds: acetaminophen, alum & mag hydroxide-simeth, diphenhydrAMINE, HYDROmorphone (DILAUDID) injection, magic mouthwash, menthol-cetylpyridinium, metoprolol tartrate, naphazoline-glycerin, phenol, sodium chloride, sodium chloride flush, traMADol, trimethobenzamide   Vital Signs    Vitals:   02/03/21 1803 02/03/21 2202 02/04/21 0204 02/04/21 0556  BP: 120/61 (!) 115/57 (!) 124/54 107/62  Pulse: 79 70 74 67  Resp: 16 18 (!) 21 15  Temp: 98.7 F (37.1 C) 98.2 F (36.8 C) 98.2 F (36.8 C) 98 F (36.7 C)  TempSrc: Oral Oral  Oral  SpO2: 96% 93% 93% 97%  Weight:      Height:        Intake/Output Summary  (Last 24 hours) at 02/04/2021 0706 Last data filed at 02/04/2021 7001 Gross per 24 hour  Intake 1252.64 ml  Output 4175 ml  Net -2922.36 ml   Last 3 Weights 02/02/2021 01/31/2021 01/30/2021  Weight (lbs) 221 lb 5.5 oz 239 lb 3.2 oz 247 lb 5.7 oz  Weight (kg) 100.4 kg 108.5 kg 112.2 kg      Telemetry    NSR - Personally Reviewed  ECG    No new tracing - Personally Reviewed  Physical Exam   GEN: No acute distress.   Neck: No JVD Cardiac: RRR, no murmurs, rubs, or gallops.  Respiratory: Clear to auscultation bilaterally. GI: Soft, nontender, non-distended  MS: No edema; No deformity. Warm Neuro:  Nonfocal  Psych: Normal affect   Labs    High Sensitivity Troponin:   Recent Labs  Lab 01/20/21 1220 01/20/21 2131  TROPONINIHS 21* 23*      Chemistry Recent Labs  Lab 01/31/21 0242 02/01/21 0259 02/02/21 0359 02/03/21 0608 02/04/21 0340  NA 144   < > 142 138 137  K 3.7   < > 3.6 4.4 4.1  CL 113*   < > 104 102 100  CO2 22   < > 29 30 29   GLUCOSE 118*   < > 132* 133* 120*  BUN 98*   < > 69* 60* 61*  CREATININE 1.81*   < >  1.51* 1.56* 1.69*  CALCIUM 8.3*   < > 8.6* 8.7* 8.8*  PROT 5.0*  --  5.5*  --  5.9*  ALBUMIN 2.0*  --  2.2*  --  2.4*  AST 29  --  29  --  27  ALT 72*  --  55*  --  49*  ALKPHOS 55  --  66  --  86  BILITOT 1.2  --  0.9  --  0.8  GFRNONAA 39*   < > 48* 46* 42*  ANIONGAP 9   < > 9 6 8    < > = values in this interval not displayed.     Hematology Recent Labs  Lab 02/01/21 0259 02/02/21 0359 02/04/21 0340  WBC 8.0 7.4 9.1  RBC 2.71* 2.73* 2.76*  HGB 8.0* 8.1* 8.1*  HCT 25.5* 25.9* 26.2*  MCV 94.1 94.9 94.9  MCH 29.5 29.7 29.3  MCHC 31.4 31.3 30.9  RDW 17.7* 17.2* 16.4*  PLT 231 263 311    BNP Recent Labs  Lab 01/29/21 0402  BNP 69.1     DDimer No results for input(s): DDIMER in the last 168 hours.   Radiology    No results found.  Cardiac Studies   TTE 01/21/21: IMPRESSIONS  1. Poor acoustic windows limit study even  with Definity. Overall LVEF  appears normal with no signficant wall motion abnormalities . Left  ventricular ejection fraction, by estimation, is 50 to 55%. The left  ventricle has low normal function. There is  mild left ventricular hypertrophy.  2. Right ventricular systolic function is moderately reduced. The right  ventricular size is mildly enlarged. There is moderately elevated  pulmonary artery systolic pressure.  3. Trivial mitral valve regurgitation.  4. The aortic valve was not well visualized. Aortic valve regurgitation  is not visualized. Mild to moderate aortic valve sclerosis/calcification  is present, without any evidence of aortic stenosis.  5. The inferior vena cava is dilated in size with <50% respiratory  variability, suggesting right atrial pressure of 15 mmHg.   Comparison(s): The left ventricular function is unchanged. The right  ventricular systolic function mildly worse.   R/LHC 2018:  Dist LAD lesion, 20 %stenosed.  The left ventricular ejection fraction is 55-65% by visual estimate. Assessment: 1. Minimal coronary plaque 2. Normal LV function EF 55-60% 3. Very mildly elevated pulmonary pressures (much lower than echo) 4. 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 Will get CT chest to further evaluate anomalous pulmonary vein. Given Qp/Qs <1.5 and lack of RV strain will likely continue to tolerate well.   Echocardiogram 2018: Study Conclusions   - Left ventricle: The cavity size was normal. Wall thickness was  normal. Systolic function was normal. The estimated ejection  fraction was in the range of 50% to 55%. Wall motion was normal;  there were no regional wall motion abnormalities. Doppler  parameters are consistent with abnormal left ventricular  relaxation (grade 1 diastolic dysfunction).  - Ventricular septum: The contour showed systolic flattening. These  changes  are consistent with RV pressure overload.  - Left atrium: The atrium was mildly dilated.  - Right ventricle: The cavity size was moderately dilated. Wall  thickness was mildly increased. Systolic function was mildly  reduced.  - Pulmonary arteries: Systolic pressure was moderately to severely  increased. PA peak pressure: 60 mm Hg (S).  - Inferior vena cava: The vessel was dilated. The respirophasic  diameter changes were blunted (<  50%), consistent with elevated  central venous pressure.  Patient Profile     75 y.o. 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 Course complicated by dehydration and hypotension (5/14), ileus and SBO and respiratory failure (5/15) due to aspiration (VQ scan negative for PE), GIB, and Afib with RVR. Cardiology was consulted on 01/26/2021 for A. fib management as well as acute diastolic HF. The patient was treated with amiodarone with subsequent reversion to sinus rhythm. Anticoagulation deferred given GIB as above. Volume status improved with IV lasix.   Assessment & Plan    #Paroxysmal Atrial Fibrillation: CHADs-vasc 5. Patient found to have Afib with RVR this admission in the setting of GIB, acute blood loss anemia, SBO, and aspiration PNA. TTE showed EF 50-55%, no RWMA, and no significant valvular abnormalities. He was  started on amiodarone gtt with conversion to sinus rhythm. AC deferred due to GIB. Remains in NSR -Continue amiodarone 200mg  daily--plan for 1 month course as he recovers from acute illness -Continue metop 25mg  XL BID (home dose is 50mg  XL daily) -Once plan to start [facility], stop ASA 81mg  daily -Plan for apixaban 5mg  BID once stable from GIB -Given new diagnosis of Afib and increased risk of stroke, patient is likely not a good long-term candidate for testosterone; Gray follow-up as  outpatient  #Acute Diastolic HF: Developed in the setting of volume resuscitation secondary to GIB. Now improved with IV diuresis. -Lasix 40mg  IV x1 this AM -Transition to lasix 40mg  PO tomorrow AM -Continue metop 25mg  XL BID (home dose is 50mg  XL daily) -Goal to add SGLT2i prior to discharge and restart home spiro once Cr improved -Monitor I/Os and daily weights  #HTN: Controlled. 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 RV was not dilated at that time, though now is dilated mildly now in the setting of volume overload. Will need monitoring as out-patient. -Follow-up as out-patient  #Nonobstructive CAD: Minimal LAD stenosis on LHC in 2018. -Resume home lipitor 20mg  daily     For questions or updates, please contact [place] Please consult [email or web address] for contact info under        Signed, Freada Bergeron, MD  02/04/2021, 7:06 AM

## 2021-02-04 NOTE — Progress Notes (Signed)
Patient complaining of increased SOB. No chest pain, no chest tightness. No pain. Oxygen saturation noted to be at 93% RA. Elevated HOB and oxygen saturation noted to be 97%. Incentive spirometer used twice at 1200 and 1500. Patient continued to c/o SOB. Pt states he usually uses his CPAP at night on 2.5L, however removed CPAP yesterday evening. Placed the patient on 2L of supplemental oxygen via nasal canula. Oxygen saturation 100%. PA notified via [facility] page. Will continue to monitor the patient.

## 2021-02-04 NOTE — Progress Notes (Signed)
PROGRESS NOTE    [name]  JFH:[id number] DOB: 1945-11-10 DOA: 01/16/2021 PCP: Bing Neighbors, NP    Brief Narrative:  [name] was admitted to the hospital with working diagnosis of left knee osteoarthritis, admitted for total knee replacement.  Complicated by small bowel obstruction, postoperative ileus, upper GI bleed, esophageal candidiasis, acute kidney injury, heart failure and atrial fibrillation.  75 year old male past medical history for hypertension, type II diabetes mellitus, peripheral artery disease, coronary artery disease, dyslipidemia and BPH who was admitted for left knee pain.  Patient was admitted to the hospital for total knee replacement, on day 3 postoperative, patient developed increasing nausea and poor appetite.  05/15 Patient developed hypotension and was transferred to stepdown unit.  He was diagnosed with ileus and partial small bowel obstruction. Central venous catheter was placed and started on vasopressors for shock.   He was diagnosed with acute hypoxic respiratory failure due to aspiration pneumonitis.   His hospitalization was complicated by atrial fibrillation requiring amiodarone and heparin drip. On 5/20 he had coffee-ground emesis through NG tube.  Required 2 units packed red blood cell transfusion and heparin drip was discontinued 05/21.  On 5/23 and 5/25 he required PRBC transfusion adding a total of 4 packed red blood cells in total.  Underwent EGD 5/26, finding esophagitis due to Candida.  On 5/27 his NG tube was removed. Underwent diuresis with furosemide for volume overload.  Diet advanced with good toleration.    Assessment & Plan:   Principal Problem:   Ileus ([place]) Active Problems:   Type 2 diabetes mellitus without complication, without long-term current use of insulin (HCC)   Essential hypertension   Obesity (BMI 30-39.9)   CAD (coronary artery disease)   Pulmonary HTN (HCC)   S/P TKR (total knee replacement) using  cement   Acute respiratory distress   AKI (acute kidney injury) ([place])   Acute renal failure (HCC)   Benign prostatic hyperplasia   Obstructive sleep apnea treated with continuous positive airway pressure (CPAP)   Hypotension   GERD (gastroesophageal reflux disease)   Protein-calorie malnutrition, moderate (HCC)   Gastrointestinal bleed   PAF (paroxysmal atrial fibrillation) (HCC)   Acute blood loss anemia   History of gastritis   Irritable bowel syndrome with intermittent diarrhea   SOB (shortness of breath)   Anasarca    1. Left knee osteoarthritis sp total knee replacement. 05/11.   Continue to encourage mobility, out of bed to chair tid with meals. Continue physical therapy and occupational therapy.   Post op care per orthopedic surgery team.   2. Postoperative ileus with partial small bowel obstruction.  Diet has been advanced with good toleration, no nausea or vomiting.   Off TPN now Continue to encourage nutrition.  Advance to regular diet.   3.AKI on CKD stage 3a/ hypokalemia. Renal function with serum cr up to 1,69 today with K of 4,1 and serum bicarbonate of 29 Urine output over last 24 hrs up to 4,175 ml   Patient very weak and deconditioned today, blood pressure systolic 937 to 342 mmHg. Positive lightheadedness and dizziness.  Decrease dose of furosemide to once daily and change to po. Follow up on renal function in am, continue to avoid hypotension and nephrotoxic medications.   4. Acute on chronic diastolic heart failure, atrial fibrillation with RVR, prolonged Qtc.  patient continue on sinus rhythm. Volume has improved.  Continue metoprolol and amiodarone for rate control. Continue to hold on anticoagulation due to recent GI bleed. Continue  with enteric aspirin 81 mg.   5. Acute blood loss anemia due to candida esophagitis (upper GI bleed)/ GERD.  Hgb and Hct stable, continue close monitoring of cell count.  Continue with fluconazole.   6. T2DM.  Dyslipidemia. Continue with atorvastatin.   Patient continue to be at high risk for   Status is: Inpatient  Remains inpatient appropriate because:Inpatient level of care appropriate due to severity of illness   Dispo:  Patient From: Home  Planned Disposition: Inpatient Rehab  Medically stable for discharge: No     DVT prophylaxis: scd   Code Status:   full  Family Communication:  I spoke with patient's wife at the bedside, we talked in detail about patient's condition, plan of care and prognosis and all questions were addressed.      Nutrition Status: Nutrition Problem: Inadequate oral intake Etiology: inability to eat Signs/Symptoms: NPO status Interventions: Boost Breeze,Prostat,TPN     Skin Documentation:     Consultants:   Cardiology   Procedures:   Left knee arthroplasty     Subjective: Patient very weak and deconditioned today, dizzy and lightheaded, no nausea or vomiting, no abdominal pain.   Objective: Vitals:   02/03/21 2202 02/04/21 0204 02/04/21 0556 02/04/21 1129  BP: (!) 115/57 (!) 124/54 107/62 (!) 123/59  Pulse: 70 74 67 73  Resp: 18 (!) 21 15   Temp: 98.2 F (36.8 C) 98.2 F (36.8 C) 98 F (36.7 C)   TempSrc: Oral  Oral   SpO2: 93% 93% 97%   Weight:      Height:        Intake/Output Summary (Last 24 hours) at 02/04/2021 1340 Last data filed at 02/04/2021 1003 Gross per 24 hour  Intake 1012.64 ml  Output 3175 ml  Net -2162.36 ml   Filed Weights   01/30/21 1027 01/31/21 0500 02/02/21 0420  Weight: 112.2 kg 108.5 kg 100.4 kg    Examination:   General: Not in pain or dyspnea, deconditioned  Neurology: Awake and alert, non focal. Dizzy and weak.  E ENT: mild pallor, no icterus, oral mucosa moist Cardiovascular: No JVD. S1-S2 present, rhythmic, no gallops, rubs, or murmurs. No lower extremity edema. Pulmonary: positive breath sounds bilaterally, adequate air movement, no wheezing, rhonchi or rales. Gastrointestinal. Abdomen soft  and non tender Skin. No rashes Musculoskeletal: no joint deformities     Data Reviewed: I have personally reviewed following labs and imaging studies  CBC: Recent Labs  Lab 01/30/21 0247 01/30/21 1006 01/31/21 0242 01/31/21 0511 02/01/21 0259 02/02/21 0359 02/04/21 0340  WBC 10.8*  --   --  10.0 8.0 7.4 9.1  NEUTROABS  --   --   --   --   --   --  6.2  HGB 7.7*  7.7*   < > 8.3* 8.5* 8.0* 8.1* 8.1*  HCT 24.9*  24.9*   < > 25.7* 26.4* 25.5* 25.9* 26.2*  MCV 96.5  --   --  94.3 94.1 94.9 94.9  PLT 202  --   --  202 231 263 311   < > = values in this interval not displayed.   Basic Metabolic Panel: Recent Labs  Lab 01/31/21 0242 02/01/21 0259 02/02/21 0359 02/03/21 0608 02/04/21 0340  NA 144 145 142 138 137  K 3.7 4.0 3.6 4.4 4.1  CL 113* 113* 104 102 100  CO2 22 27 29 30 29   GLUCOSE 118* 140* 132* 133* 120*  BUN 98* 87* 69* 60* 61*  CREATININE 1.81*  1.71* 1.51* 1.56* 1.69*  CALCIUM 8.3* 8.5* 8.6* 8.7* 8.8*  MG 2.3 2.2 2.0 2.1 2.1  PHOS 3.9 3.8 4.0 4.5 5.3*   GFR: Estimated Creatinine Clearance: 43.4 mL/min (A) (by C-G formula based on SCr of 1.69 mg/dL (H)). Liver Function Tests: Recent Labs  Lab 01/31/21 0242 02/02/21 0359 02/04/21 0340  AST 29 29 27   ALT 72* 55* 49*  ALKPHOS 55 66 86  BILITOT 1.2 0.9 0.8  PROT 5.0* 5.5* 5.9*  ALBUMIN 2.0* 2.2* 2.4*   No results for input(s): LIPASE, AMYLASE in the last 168 hours. Recent Labs  Lab 01/30/21 1106 01/31/21 0510  AMMONIA 71* 21   Coagulation Profile: No results for input(s): INR, PROTIME in the last 168 hours. Cardiac Enzymes: No results for input(s): CKTOTAL, CKMB, CKMBINDEX, TROPONINI in the last 168 hours. BNP (last 3 results) No results for input(s): PROBNP in the last 8760 hours. HbA1C: No results for input(s): HGBA1C in the last 72 hours. CBG: Recent Labs  Lab 02/03/21 2020 02/04/21 0000 02/04/21 0350 02/04/21 0716 02/04/21 1149  GLUCAP 172* 129* 118* 129* 142*   Lipid  Profile: Recent Labs    02/04/21 0320  TRIG 77   Thyroid Function Tests: No results for input(s): TSH, T4TOTAL, FREET4, T3FREE, THYROIDAB in the last 72 hours. Anemia Panel: No results for input(s): VITAMINB12, FOLATE, FERRITIN, TIBC, IRON, RETICCTPCT in the last 72 hours.    Radiology Studies: I have reviewed all of the imaging during this hospital visit personally     Scheduled Meds: . (feeding supplement) PROSource Plus  30 mL Oral BID BM  . allopurinol  100 mg Oral QHS  . amiodarone  200 mg Oral Daily  . aspirin EC  81 mg Oral Daily  . atorvastatin  20 mg Oral Daily  . bisacodyl  10 mg Rectal Daily  . Chlorhexidine Gluconate Cloth  6 each Topical Daily  . docusate sodium  100 mg Oral Daily  . feeding supplement  1 Container Oral BID BM  . [START ON 02/05/2021] furosemide  40 mg Oral Daily  . insulin aspart  0-15 Units Subcutaneous Q4H  . lip balm  1 application Topical BID  . mouth rinse  15 mL Mouth Rinse BID  . metoCLOPramide (REGLAN) injection  5 mg Intravenous Q8H  . metoprolol succinate  25 mg Oral BID  . pantoprazole  40 mg Oral BID  . sodium chloride flush  10-40 mL Intracatheter Q12H   Continuous Infusions: . sodium chloride 10 mL/hr at 02/03/21 2046  . fluconazole (DIFLUCAN) IV 100 mg (02/04/21 1251)  . TPN ADULT (ION) Stopped (02/04/21 1003)     LOS: 19 days        Abdelrahman Nair Gerome Apley, MD

## 2021-02-04 NOTE — TOC Progression Note (Signed)
Transition of Care [facility]) - Progression Note    Patient Details  Name: [name] MRN: [id number] Date of Birth: 1945/11/13  Transition of [facility]) CM/SW [place], [place] Phone Number: 02/04/2021, 10:29 AM  Clinical Narrative:   Reached out to son who is confident that if patient does not get into [facility], will get into [place].  No one is avialable at [employer] place to review paperwork today.  Will await decision tomorrow. TOC will continue to follow during the course of hospitalization.     Expected Discharge Plan: [place] Barriers to Discharge: Barriers Resolved  Expected Discharge Plan and Services Expected Discharge Plan: [place] In-house Referral: Clinical Social Work   Post Acute Care Choice: [place] Living arrangements for the past 2 months: [place] Expected Discharge Date: 01/20/21               DME Arranged: 3-N-1,[place] rolling DME Agency: [employer] Date DME Agency Contacted: 01/19/21 Time DME Agency Contacted: [id number] Representative spoke with at DME Agency: [place]: PT [place]: [facility] [place] office) Date [place]: 01/18/21 Time [place]: 1000 Representative spoke with at [place] (ph# [phone]; fax # [phone])   Social Determinants of Health ([place]) Interventions    Readmission Risk Interventions Readmission Risk Prevention Plan 01/17/2021  Transportation Screening Complete  PCP or Specialist Appt within 5-7 Days Complete  Home Care Screening Complete  Some recent data might be hidden

## 2021-02-04 NOTE — Progress Notes (Signed)
Patient ID: [name], male   DOB: 17-Jan-1946, 75 y.o.   MRN: [id number] Subjective:  19 days s/p left TKR complicated by medical related issues requiring ICU stay  Patient reports pain as mild. Feels very weak and deconditioned, expresses concerns it is related to the fact that he has not had his testosterone injections in what he says is like 8 weeks  Objective:   VITALS:   Vitals:   02/04/21 0204 02/04/21 0556  BP: (!) 124/54 107/62  Pulse: 74 67  Resp: (!) 21 15  Temp: 98.2 F (36.8 C) 98 F (36.7 C)  SpO2: 93% 97%    Neurovascular intact Incision: no drainage steri strips over his healed knee incision at this point  LABS Recent Labs    02/02/21 0359 02/04/21 0340  HGB 8.1* 8.1*  HCT 25.9* 26.2*  WBC 7.4 9.1  PLT 263 311    Recent Labs    02/02/21 0359 02/03/21 0608 02/04/21 0340  NA 142 138 137  K 3.6 4.4 4.1  BUN 69* 60* 61*  CREATININE 1.51* 1.56* 1.69*  GLUCOSE 132* 133* 120*    No results for input(s): LABPT, INR in the last 72 hours.   Assessment/Plan: POD 19 s/p left TKR   Plan: Continue PT, CPM while waiting disposition [place] to follow until discharge Restarted on ECASA yesterday

## 2021-02-04 NOTE — Progress Notes (Signed)
Physical Therapy Treatment Patient Details Name: [name] MRN: [id number] DOB: 1945/11/02 Today's Date: 02/04/2021    History of Present Illness Pt is a 75 year old male s/p left TKA on 01/16/21. Transferred to ICU on 5/15 due to respiratory failure, ileus, kidney failure.  Transferred to [place] over weekend.    PT Comments    POD #19 L TKR post op Ileus Assisted OOB to [facility].  General bed mobility comments: cues for sequence with assist to manage L LE and assist for upper body due to ABD girth.  General transfer comment: assisted from elevated bed to [facility] with walker then from [facility] to standing with EVA to attempt amb.  Pt c/o increased dizziness with standing so only assisted to recliner.  BP was soft 107/56, HR 95 and RA 99%.  Will attempt gait this afternoon.   Follow Up Recommendations  CIR     Equipment Recommendations  Rolling walker with 5" wheels    Recommendations for Other Services       Precautions / Restrictions Precautions Precautions: Fall;Knee Precaution Comments: monitor vitals Restrictions Weight Bearing Restrictions: No LLE Weight Bearing: Weight bearing as tolerated    Mobility  Bed Mobility Overal bed mobility: Needs Assistance Bed Mobility: Supine to Sit     Supine to sit: Mod assist;HOB elevated     General bed mobility comments: cues for sequence with assist to manage L LE and assist for upper body due to ABD girth    Transfers Overall transfer level: Needs assistance Equipment used: Rolling walker (2 wheeled) Transfers: Sit to/from Stand Sit to Stand: Min assist;+2 physical assistance;+2 safety/equipment Stand pivot transfers: Min assist;+2 physical assistance;+2 safety/equipment       General transfer comment: assisted from elevated bed to [facility] with walker then from [facility] to standing with EVA to attempt amb.  Pt c/o increased dizziness with standing so only assisted to recliner.  BP was soft 107/56, HR 95 and RA 99%.  Will attempt gait this  afternoon.  Ambulation/Gait                 Stairs             Wheelchair Mobility    Modified Rankin (Stroke Patients Only)       Balance                                            Cognition Arousal/Alertness: Awake/alert Behavior During Therapy: WFL for tasks assessed/performed;Impulsive Overall Cognitive Status: Within Functional Limits for tasks assessed                                 General Comments: AxO x 3 pleasant MAX c/o fatigue "no energy"      Exercises      General Comments        Pertinent Vitals/Pain Pain Assessment: Faces Faces Pain Scale: Hurts a little bit Pain Location: L knee with therex Pain Descriptors / Indicators: Sore;Tightness Pain Intervention(s): Monitored during session;Repositioned    Home Living                      Prior Function            PT Goals (current goals can now be found in the care plan section) Progress towards PT goals:  Progressing toward goals    Frequency    Min 5X/week      PT Plan Current plan remains appropriate    Co-evaluation              AM-PAC PT "6 Clicks" Mobility   Outcome Measure  Help needed turning from your back to your side while in a flat bed without using bedrails?: A Little Help needed moving from lying on your back to sitting on the side of a flat bed without using bedrails?: A Little Help needed moving to and from a bed to a chair (including a wheelchair)?: A Little Help needed standing up from a chair using your arms (e.g., wheelchair or bedside chair)?: A Lot Help needed to walk in hospital room?: A Lot Help needed climbing 3-5 steps with a railing? : Total 6 Click Score: 14    End of Session Equipment Utilized During Treatment: Gait belt Activity Tolerance: Patient limited by fatigue Patient left: in chair;with call bell/phone within reach;with family/visitor present Nurse Communication: Mobility status PT  Visit Diagnosis: Other abnormalities of gait and mobility (R26.89)     Time: 9136-8599 PT Time Calculation (min) (ACUTE ONLY): 26 min  Charges:  $Therapeutic Activity: 23-37 mins                     Rica Koyanagi  PTA Acute  Rehabilitation Services Pager      [phone] Office      [phone]

## 2021-02-04 NOTE — Care Management Important Message (Signed)
Medicare IM given to the patient by Ranessa Kosta. 

## 2021-02-04 NOTE — Progress Notes (Signed)
Spoke with Lab staff and patient at this time has had all morning labs drawn. Consult will be completed at this time.

## 2021-02-04 NOTE — Progress Notes (Signed)
   Patient Details Name: [name] MRN: [id number] DOB: 02-Jul-1946   Cancelled Treatment:     No PM session due to MAX c/o fatigue already back to bed.  Not able to tolerate further activity.     Nathanial Rancher 02/04/2021, 3:38 PM

## 2021-02-04 NOTE — Progress Notes (Signed)
Patient is complaining of SOB at both rest and on exertion. RR 22 and oxygen saturation noted to be 100%. Both physician and PA notified. Patient is also c/o increased fatigue. No new orders. Will continue to monitor.

## 2021-02-05 DIAGNOSIS — K9189 Other postprocedural complications and disorders of digestive system: Secondary | ICD-10-CM

## 2021-02-05 DIAGNOSIS — N179 Acute kidney failure, unspecified: Secondary | ICD-10-CM | POA: Diagnosis not present

## 2021-02-05 DIAGNOSIS — Z8719 Personal history of other diseases of the digestive system: Secondary | ICD-10-CM | POA: Diagnosis not present

## 2021-02-05 DIAGNOSIS — K567 Ileus, unspecified: Secondary | ICD-10-CM

## 2021-02-05 DIAGNOSIS — R0603 Acute respiratory distress: Secondary | ICD-10-CM | POA: Diagnosis not present

## 2021-02-05 DIAGNOSIS — D62 Acute posthemorrhagic anemia: Secondary | ICD-10-CM | POA: Diagnosis not present

## 2021-02-05 DIAGNOSIS — B3781 Candidal esophagitis: Secondary | ICD-10-CM | POA: Diagnosis not present

## 2021-02-05 LAB — BASIC METABOLIC PANEL
Anion gap: 9 (ref 5–15)
BUN: 59 mg/dL — ABNORMAL HIGH (ref 8–23)
CO2: 28 mmol/L (ref 22–32)
Calcium: 8.6 mg/dL — ABNORMAL LOW (ref 8.9–10.3)
Chloride: 99 mmol/L (ref 98–111)
Creatinine, Ser: 1.81 mg/dL — ABNORMAL HIGH (ref 0.61–1.24)
GFR, Estimated: 39 mL/min — ABNORMAL LOW (ref >60)
Glucose, Bld: 114 mg/dL — ABNORMAL HIGH (ref 70–99)
Potassium: 4.3 mmol/L (ref 3.5–5.1)
Sodium: 136 mmol/L (ref 135–145)

## 2021-02-05 LAB — HEMOGLOBIN AND HEMATOCRIT, BLOOD
HCT: 30.1 % — ABNORMAL LOW (ref 39.0–52.0)
Hemoglobin: 9.1 g/dL — ABNORMAL LOW (ref 13.0–17.0)

## 2021-02-05 LAB — GLUCOSE, CAPILLARY
Glucose-Capillary: 89 mg/dL (ref 70–99)
Glucose-Capillary: 111 mg/dL — ABNORMAL HIGH (ref 70–99)
Glucose-Capillary: 129 mg/dL — ABNORMAL HIGH (ref 70–99)
Glucose-Capillary: 119 mg/dL — ABNORMAL HIGH (ref 70–99)
Glucose-Capillary: 152 mg/dL — ABNORMAL HIGH (ref 70–99)

## 2021-02-05 NOTE — Progress Notes (Signed)
Occupational Therapy Treatment Patient Details Name: [name] MRN: [id number] DOB: 1946/06/19 Today's Date: 02/05/2021    History of present illness Pt is a 75 year old male s/p left TKA on 01/16/21. Transferred to ICU on 5/15 due to respiratory failure, ileus, kidney failure.  Transferred to [place] over weekend.   OT comments  Patient progressing slowly toward goals. Patient performed sit to stand transfers from elevated surfaces with Min A and rocking technique with cues for hand placement and safety with RW. Patient continues to have anxiety about functional mobility and transfers often sitting before it's safe to do so. OT provided education on safe hand placement and proximity to EOB/chair. Patient expressed verbal understanding. Patient also requires increased coaxing/encouragment for completion of therapeutic activity and ADLs. Goals upgraded to reflect patient progress.    Follow Up Recommendations  CIR    Equipment Recommendations  3 in 1 bedside commode    Recommendations for Other Services      Precautions / Restrictions Precautions Precautions: Fall;Knee Precaution Comments: monitor vitals Restrictions LLE Weight Bearing: Weight bearing as tolerated       Mobility Bed Mobility Overal bed mobility: Needs Assistance Bed Mobility: Sit to Supine       Sit to supine: Supervision   General bed mobility comments: Patient able to advance BLE from EOB to bed surface and lower trunk without external assist.    Transfers Overall transfer level: Needs assistance Equipment used: Rolling walker (2 wheeled) Transfers: Sit to/from Stand Sit to Stand: Min assist Stand pivot transfers: Min assist       General transfer comment: Min A with rocking technique for sit to stand from recliner and BSC. Cues for hand placement/safety. Patient very hesitant requiring increased coaxing/encouragement. Sits before safe to do so. Education proivded on walker management and hand  placement prior to sititng. Patient expressed verbal understanding.    Balance Overall balance assessment: Needs assistance Sitting-balance support: No upper extremity supported;Feet supported Sitting balance-Leahy Scale: Fair     Standing balance support: During functional activity;Bilateral upper extremity supported Standing balance-Leahy Scale: Poor Standing balance comment: reliant on UEs and external assistance                           ADL either performed or assessed with clinical judgement   ADL Overall ADL's : Needs assistance/impaired                         Toilet Transfer: RW;Moderate assistance Toilet Transfer Details (indicate cue type and reason): Light Mod A for stand-pivot to [facility] with use of RW. Cues for hand placement and safety.                 Vision       Perception     Praxis      Cognition Arousal/Alertness: Awake/alert Behavior During Therapy: WFL for tasks assessed/performed;Impulsive Overall Cognitive Status: Within Functional Limits for tasks assessed                                          Exercises     Shoulder Instructions       General Comments Patient with increased anxiety about movement. Requires increased coaxing/encouragement.    Pertinent Vitals/ Pain       Pain Assessment: Faces Faces Pain Scale: Hurts a  little bit Pain Location: L knee with mobility Pain Descriptors / Indicators: Sore;Tightness Pain Intervention(s): Limited activity within patient's tolerance;Monitored during session;Premedicated before session;Repositioned  Home Living                                          Prior Functioning/Environment              Frequency  Min 2X/week        Progress Toward Goals  OT Goals(current goals can now be found in the care plan section)  Progress towards OT goals: Progressing toward goals  Acute Rehab OT Goals Patient Stated Goal: to go home to  wife OT Goal Formulation: With patient Time For Goal Achievement: 02/19/21 Potential to Achieve Goals: Good ADL Goals Pt Will Perform Lower Body Dressing: sitting/lateral leans;sit to/from stand;with adaptive equipment;with supervision Pt Will Transfer to Toilet: with supervision;ambulating;regular height toilet Pt Will Perform Toileting - Clothing Manipulation and hygiene: with supervision;sitting/lateral leans;sit to/from stand  Plan Discharge plan remains appropriate;Frequency remains appropriate    Co-evaluation                 AM-PAC OT "6 Clicks" Daily Activity     Outcome Measure   Help from another person eating meals?: None Help from another person taking care of personal grooming?: A Little Help from another person toileting, which includes using toliet, bedpan, or urinal?: A Lot Help from another person bathing (including washing, rinsing, drying)?: A Lot Help from another person to put on and taking off regular upper body clothing?: A Little Help from another person to put on and taking off regular lower body clothing?: Total 6 Click Score: 15    End of Session Equipment Utilized During Treatment: Gait belt;Rolling walker;Left knee immobilizer  OT Visit Diagnosis: Unsteadiness on feet (R26.81);Other abnormalities of gait and mobility (R26.89);Muscle weakness (generalized) (M62.81);Pain Pain - Right/Left: Left Pain - part of body: Knee   Activity Tolerance Patient tolerated treatment well   Patient Left with call bell/phone within reach;with chair alarm set;in bed;Other (comment) (Ortho tech present to don CPM)   Nurse Communication Mobility status        Time: 0930-1002 OT Time Calculation (min): 32 min  Charges: OT General Charges $OT Visit: 1 Visit OT Treatments $Self Care/Home Management : 23-37 mins  [name] H. OTR/L Supplemental OT, Department of rehab services [phone]   [name] H. 02/05/2021, 10:09 AM

## 2021-02-05 NOTE — Progress Notes (Signed)
Physical Therapy Treatment Patient Details Name: [name] MRN: [id number] DOB: [birth year] Today's Date: 02/05/2021    History of Present Illness Pt is a 75 year old male s/p left TKA on 01/16/21. Transferred to ICU on 5/15 due to respiratory failure, ileus, kidney failure.  Transferred to [place] over weekend.    PT Comments    POD # 20 Pt feeling "a little stronger" now that "I can eat" Pt was in bed on CPM.  Removed from CPM at 13:30 Assisted OOB to amb.  General bed mobility comments: Patient able to advance BLE off bed with increased time, use of rail but still needed Min Assist for upper body. General transfer comment: use a RW for transfers and EVA for gait.  Pt able to rise with Min Assist from elevated bed bed but Mod Assist from lower level recliner with 50% VC's on proper hand placement to "push up" vs pull up on walker which he does everytime.  Also increased assist for safety with turns/targeting.General Gait Details: used EVA walker for increased support and increase gait distance.  Spouse follows with recliner.  Limited distance due to fatigue/weakness having had an extended hospital stay.  Avg HR with amb 99 and avg RA was 98%.  Knee pain is minimal "just tight". Assisted back to room and performed some TKR TE's.  Pt present with approx 80 degrees flexion.   Pt plans ST Rehab at SNF prior to returning home with spouse.    Follow Up Recommendations  SNF (family prefers a SNF in [place])     Equipment Recommendations       Recommendations for Other Services       Precautions / Restrictions Precautions Precautions: Fall;Knee Precaution Comments: monitor vitals Restrictions Weight Bearing Restrictions: No LLE Weight Bearing: Weight bearing as tolerated    Mobility  Bed Mobility Overal bed mobility: Needs Assistance Bed Mobility: Supine to Sit     Supine to sit: HOB elevated;Min assist     General bed mobility comments: Patient able to advance BLE off bed  with increased time, use of rail but still needed Min Assist for upper body.    Transfers Overall transfer level: Needs assistance Equipment used: Bilateral platform walker (EVA walker) Transfers: Sit to/from Stand Sit to Stand: Min assist         General transfer comment: use a RW for transfers and EVA for gait.  Pt able to rise with Min Assist from elevated bed bed but Mod Assist from lower level recliner with 50% VC's on proper hand placement to "push up" vs pull up on walker which he does everytime.  Also increased assist for safety with turns/targeting.  Ambulation/Gait Ambulation/Gait assistance: Min assist;+2 safety/equipment Gait Distance (Feet): 50 Feet (25 fgeet x 2 one seated rest break) Assistive device: Bilateral platform walker (EVA walker) Gait Pattern/deviations: Step-to pattern;Decreased stance time - left;Trunk flexed;Decreased weight shift to left;Decreased stride length Gait velocity: decreased   General Gait Details: used EVA walker for increased support and increase gait distance.  Spouse follows with recliner.  Limited distance due to fatigue/weakness having had an extended hospital stay.  Avg HR with amb 99 and avg RA was 98%.  Knee pain is minimal "just tight".   Stairs             Wheelchair Mobility    Modified Rankin (Stroke Patients Only)       Balance  Cognition Arousal/Alertness: Awake/alert Behavior During Therapy: WFL for tasks assessed/performed;Impulsive Overall Cognitive Status: Within Functional Limits for tasks assessed                                 General Comments: AxO x 3 pleasant MAX c/o fatigue "no energy"      Exercises   Total Knee Replacement TE's following HEP handout 10 reps B LE ankle pumps 05 reps towel squeezes 05 reps knee presses 05 reps heel slides  05 reps SAQ's 05 reps SLR's 05 reps ABD Educated on use of gait belt to assist  with TE's Followed by [facility]     General Comments        Pertinent Vitals/Pain Pain Assessment: Faces Faces Pain Scale: Hurts a little bit Pain Location: L knee with mobility Pain Descriptors / Indicators: Sore;Tightness Pain Intervention(s): Monitored during session    Home Living                      Prior Function            PT Goals (current goals can now be found in the care plan section) Progress towards PT goals: Progressing toward goals    Frequency    Min 5X/week      PT Plan Current plan remains appropriate    Co-evaluation              AM-PAC PT "6 Clicks" Mobility   Outcome Measure  Help needed turning from your back to your side while in a flat bed without using bedrails?: A Little Help needed moving from lying on your back to sitting on the side of a flat bed without using bedrails?: A Little Help needed moving to and from a bed to a chair (including a wheelchair)?: A Little Help needed standing up from a chair using your arms (e.g., wheelchair or bedside chair)?: A Lot Help needed to walk in hospital room?: A Lot Help needed climbing 3-5 steps with a railing? : Total 6 Click Score: 14    End of Session Equipment Utilized During Treatment: Gait belt Activity Tolerance: Patient limited by fatigue Patient left: in chair;with call bell/phone within reach;with family/visitor present Nurse Communication: Mobility status PT Visit Diagnosis: Other abnormalities of gait and mobility (R26.89)     Time: 1330-1400 PT Time Calculation (min) (ACUTE ONLY): 30 min  Charges:  $Gait Training: 8-22 mins $Therapeutic Exercise: 8-22 mins                     Rica Koyanagi  PTA Acute  Rehabilitation Services Pager      [phone] Office      [phone]

## 2021-02-05 NOTE — TOC Progression Note (Signed)
Transition of Care [facility]) - Progression Note    Patient Details  Name: [name] MRN: [id number] Date of Birth: 1945/11/08  Transition of Care [facility]) CM/SW [place], [place] Phone Number: 02/05/2021, 1:51 PM  Clinical Narrative:   Received call from Coralyn Mark at [facility] in [place] with request for updated clinicals.  Sent.  Her call back is [phone].  She also asked for COVID test.  Order seen and appreciated. TOC will continue to follow during the course of hospitalization.     Expected Discharge Plan: [place] Barriers to Discharge: Barriers Resolved  Expected Discharge Plan and Services Expected Discharge Plan: [place] In-house Referral: Clinical Social Work   Post Acute Care Choice: [place] Living arrangements for the past 2 months: [place] Expected Discharge Date: 01/20/21               DME Arranged: 3-N-1,Walker rolling DME Agency: [employer] Date DME Agency Contacted: 01/19/21 Time DME Agency Contacted: [id number] Representative spoke with at DME Agency: [place]: PT [place]: [facility] office) Date [place]: 01/18/21 Time [place]: 1000 Representative spoke with at [place]: [place] (ph# [phone]; fax # [phone])   Social Determinants of Health ([place]) Interventions    Readmission Risk Interventions Readmission Risk Prevention Plan 01/17/2021  Transportation Screening Complete  PCP or Specialist Appt within 5-7 Days Complete  Home Care Screening Complete  Some recent data might be hidden

## 2021-02-05 NOTE — Progress Notes (Signed)
Subjective: 5 Days Post-Op Procedure(s) (LRB): ESOPHAGOGASTRODUODENOSCOPY (EGD) WITH PROPOFOL (N/A) Patient reports pain as 3 on 0-10 scale.   Denies CP or SOB.  Voiding without difficulty. Positive flatus. Objective: Vital signs in last 24 hours: Temp:  [97.7 F (36.5 C)-98.7 F (37.1 C)] 98 F (36.7 C) (05/31 1028) Pulse Rate:  [70-79] 79 (05/31 1028) Resp:  [14-22] 14 (05/31 1028) BP: (110-146)/(57-123) 146/123 (05/31 1028) SpO2:  [95 %-100 %] 97 % (05/31 1028)  Intake/Output from previous day: 05/30 0701 - 05/31 0700 In: 1930.6 [P.O.:960; I.V.:919.7; IV Piggyback:51] Out: 1250 [Urine:1250] Intake/Output this shift: Total I/O In: -  Out: 250 [Urine:250]  Recent Labs    02/04/21 0340 02/05/21 0935  HGB 8.1* 9.1*   Recent Labs    02/04/21 0340 02/05/21 0935  WBC 9.1  --   RBC 2.76*  --   HCT 26.2* 30.1*  PLT 311  --    Recent Labs    02/04/21 0340 02/05/21 0407  NA 137 136  K 4.1 4.3  CL 100 99  CO2 29 28  BUN 61* 59*  CREATININE 1.69* 1.81*  GLUCOSE 120* 114*  CALCIUM 8.8* 8.6*   No results for input(s): LABPT, INR in the last 72 hours.  Neurologically intact Intact pulses distally Dorsiflexion/Plantar flexion intact Incision: no drainage  Assessment/Plan:  5 Days Post-Op Procedure(s) (LRB): ESOPHAGOGASTRODUODENOSCOPY (EGD) WITH PROPOFOL (N/A) Advance diet  Discussed with [place] slightly elevated perhaps secondary to diuresis. Watch lasix Hgb 9.1 increased. Rehab pending Will be on Hospitalist service Discussed with family When anticoag with Eliquis then D/C asa Continue PT/OT   Principal Problem:   Ileus ([place]) Active Problems:   Type 2 diabetes mellitus without complication, without long-term current use of insulin (HCC)   Essential hypertension   Obesity (BMI 30-39.9)   CAD (coronary artery disease)   Pulmonary HTN (HCC)   S/P TKR (total knee replacement) using cement   Acute respiratory distress   AKI (acute kidney injury)  ([place])   Acute renal failure (HCC)   Benign prostatic hyperplasia   Obstructive sleep apnea treated with continuous positive airway pressure (CPAP)   Hypotension   GERD (gastroesophageal reflux disease)   Protein-calorie malnutrition, moderate (HCC)   Gastrointestinal bleed   PAF (paroxysmal atrial fibrillation) (HCC)   Acute blood loss anemia   History of gastritis   Irritable bowel syndrome with intermittent diarrhea   SOB (shortness of breath)   Anasarca      Johnn Hai 02/05/2021, @NOW 

## 2021-02-05 NOTE — Progress Notes (Signed)
Progress Note  Patient Name: [name] Date of Encounter: 02/05/2021  Primary Cardiologist:   Ida Rogue, MD   Subjective   No chest pain.  No SOB.   Inpatient Medications    Scheduled Meds: . (feeding supplement) PROSource Plus  30 mL Oral BID BM  . allopurinol  100 mg Oral QHS  . amiodarone  200 mg Oral Daily  . aspirin EC  81 mg Oral Daily  . atorvastatin  20 mg Oral Daily  . bisacodyl  10 mg Rectal Daily  . Chlorhexidine Gluconate Cloth  6 each Topical Daily  . docusate sodium  100 mg Oral Daily  . feeding supplement  1 Container Oral BID BM  . furosemide  40 mg Oral Daily  . insulin aspart  0-15 Units Subcutaneous Q4H  . lip balm  1 application Topical BID  . mouth rinse  15 mL Mouth Rinse BID  . metoCLOPramide (REGLAN) injection  5 mg Intravenous Q8H  . metoprolol succinate  25 mg Oral BID  . pantoprazole  40 mg Oral BID  . sodium chloride flush  10-40 mL Intracatheter Q12H   Continuous Infusions: . sodium chloride 10 mL/hr at 02/03/21 2046  . fluconazole (DIFLUCAN) IV 100 mg (02/04/21 1251)   PRN Meds: acetaminophen, alum & mag hydroxide-simeth, HYDROmorphone (DILAUDID) injection, magic mouthwash, menthol-cetylpyridinium, naphazoline-glycerin, phenol, sodium chloride, sodium chloride flush, traMADol, traZODone, trimethobenzamide   Vital Signs    Vitals:   02/04/21 1817 02/04/21 2008 02/05/21 0409 02/05/21 0809  BP: (!) 111/57 110/63 121/63 117/72  Pulse: 74 77 73 70  Resp: 20 (!) 21 19   Temp: 98.7 F (37.1 C) 98.2 F (36.8 C) 97.7 F (36.5 C)   TempSrc:  Oral Oral   SpO2: 95% 97% 99% 100%  Weight:      Height:        Intake/Output Summary (Last 24 hours) at 02/05/2021 0956 Last data filed at 02/05/2021 0600 Gross per 24 hour  Intake 1690.6 ml  Output 1250 ml  Net 440.6 ml   Filed Weights   01/30/21 1027 01/31/21 0500 02/02/21 0420  Weight: 112.2 kg 108.5 kg 100.4 kg    Telemetry    NSR - Personally Reviewed  ECG    NA -  Personally Reviewed  Physical Exam   GEN: No acute distress.   Neck: No  JVD Cardiac: RRR, no murmurs, rubs, or gallops.  Respiratory: Clear  to auscultation bilaterally. GI: Soft, nontender, non-distended  MS: No edema; No deformity. Neuro:  Nonfocal  Psych: Normal affect   Labs    Chemistry Recent Labs  Lab 01/31/21 0242 02/01/21 0259 02/02/21 0359 02/03/21 0608 02/04/21 0340 02/05/21 0407  NA 144   < > 142 138 137 136  K 3.7   < > 3.6 4.4 4.1 4.3  CL 113*   < > 104 102 100 99  CO2 22   < > 29 30 29 28   GLUCOSE 118*   < > 132* 133* 120* 114*  BUN 98*   < > 69* 60* 61* 59*  CREATININE 1.81*   < > 1.51* 1.56* 1.69* 1.81*  CALCIUM 8.3*   < > 8.6* 8.7* 8.8* 8.6*  PROT 5.0*  --  5.5*  --  5.9*  --   ALBUMIN 2.0*  --  2.2*  --  2.4*  --   AST 29  --  29  --  27  --   ALT 72*  --  55*  --  49*  --   ALKPHOS 55  --  66  --  86  --   BILITOT 1.2  --  0.9  --  0.8  --   GFRNONAA 39*   < > 48* 46* 42* 39*  ANIONGAP 9   < > 9 6 8 9    < > = values in this interval not displayed.     Hematology Recent Labs  Lab 02/01/21 0259 02/02/21 0359 02/04/21 0340 02/05/21 0935  WBC 8.0 7.4 9.1  --   RBC 2.71* 2.73* 2.76*  --   HGB 8.0* 8.1* 8.1* 9.1*  HCT 25.5* 25.9* 26.2* 30.1*  MCV 94.1 94.9 94.9  --   MCH 29.5 29.7 29.3  --   MCHC 31.4 31.3 30.9  --   RDW 17.7* 17.2* 16.4*  --   PLT 231 263 311  --     Cardiac EnzymesNo results for input(s): TROPONINI in the last 168 hours. No results for input(s): TROPIPOC in the last 168 hours.   BNPNo results for input(s): BNP, PROBNP in the last 168 hours.   DDimer No results for input(s): DDIMER in the last 168 hours.   Radiology    DG Chest Port 1 View  Result Date: 02/04/2021 CLINICAL DATA:  Shortness of breath EXAM: PORTABLE CHEST 1 VIEW COMPARISON:  01/27/2021 FINDINGS: Interval removal of esophagogastric tube. Otherwise unchanged examination with elevation of the left hemidiaphragm. No acute appearing airspace opacity.  Right upper extremity PICC. IMPRESSION: 1.  Interval removal of esophagogastric tube. 2. Otherwise unchanged examination with elevation of the left hemidiaphragm. No acute appearing airspace opacity. Electronically Signed   By: Eddie Candle M.D.   On: 02/04/2021 19:45    Cardiac Studies   ECHO  1. Poor acoustic windows limit study even with Definity. Overall LVEF  appears normal with no signficant wall motion abnormalities . Left  ventricular ejection fraction, by estimation, is 50 to 55%. The left  ventricle has low normal function. There is  mild left ventricular hypertrophy.  2. Right ventricular systolic function is moderately reduced. The right  ventricular size is mildly enlarged. There is moderately elevated  pulmonary artery systolic pressure.  3. Trivial mitral valve regurgitation.  4. The aortic valve was not well visualized. Aortic valve regurgitation  is not visualized. Mild to moderate aortic valve sclerosis/calcification  is present, without any evidence of aortic stenosis.  5. The inferior vena cava is dilated in size with <50% respiratory  variability, suggesting right atrial pressure of 15 mmHg.     Patient Profile     75 y.o. 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 Course complicated by dehydration and hypotension (5/14), ileus and SBO and respiratory failure (5/15) due to aspiration (VQ scan negative for PE), GIB, and Afib with RVR. Cardiology was consulted on 01/26/2021 for A. fib management as well as acute diastolic HF. The patient was treated with amiodarone with subsequent reversion to sinus rhythm. Anticoagulation deferred given GIB as above. Volume status improved with IV lasix.   Assessment & Plan    ATRIAL FIB WITH RVR:  Maintaining NSR on PO amiodarone.  Start DOAC when OK from a medical standpoint.     ACUTE DIASTOLIC HF:     Transitioned to PO Lasix today.   Net negative 3.7 liters.  Continue current therapy.     HTN:  BP controlled.  Continue current therapy.   PARTIAL ANOMALOUS PULMONARY VENOUS RETURN:  Not felt to be hemodynamically significant.    We will follow as needed.    For questions or updates, please contact [place] Please consult [email or web address] for contact info under Cardiology/STEMI.   Signed, Minus Breeding, MD  02/05/2021, 9:56 AM

## 2021-02-05 NOTE — Progress Notes (Signed)
PROGRESS NOTE    Jesse [name]  QZR:[id number] DOB: [birth year] DOA: 01/16/2021 PCP: Bing Neighbors, NP    Brief Narrative:  Jesse [name] admitted to the hospital with working diagnosis of left knee osteoarthritis, admitted for total knee replacement. Complicated by small bowel obstruction, postoperative ileus, upper GI bleed, esophageal candidiasis, acute kidney injury, heart failure and atrial fibrillation.  75 year old [name] past medical history for hypertension, type IIdiabetesmellitus, peripheral artery disease, coronary artery disease, dyslipidemia and BPH who was admitted for left knee pain. Jesse [name] was admitted to the hospital for total knee replacement, on day 3 postoperative, Jesse [name] developed increasing nausea and poor appetite.  05/15Patient developed hypotension and was transferred to stepdown unit. He was diagnosed with ileus and partial small bowel obstruction. Central venous catheter was placed and started on vasopressors for shock.   He was diagnosed with acute hypoxic respiratory failure due to aspiration pneumonitis.   Hishospitalization was complicated by atrial fibrillation requiring amiodarone and heparin drip. On 5/20 he had coffee-ground emesis through NG tube. Required 2 units packed red blood cell transfusion and heparin drip was discontinued 05/21.On 5/23 and 5/25 he required PRBC transfusion adding a total of 4 packed red blood cells in total.  Underwent EGD 5/26, finding esophagitis due to Candida.  On 5/27 his NG tube was removed. Underwent diuresis with furosemide for volume overload.  Diet advanced with good toleration.  Pending Jesse [name] transfer to CIR   Assessment & Plan:   Principal Problem:   Ileus ([place]) Active Problems:   Type 2 diabetes mellitus without complication, without long-term current use of insulin (HCC)   Essential hypertension   Obesity (BMI 30-39.9)   CAD (coronary artery disease)   Pulmonary HTN (HCC)   S/P  TKR (total knee replacement) using cement   Acute respiratory distress   AKI (acute kidney injury) ([place])   Acute renal failure (HCC)   Benign prostatic hyperplasia   Obstructive sleep apnea treated with continuous positive airway pressure (CPAP)   Hypotension   GERD (gastroesophageal reflux disease)   Protein-calorie malnutrition, moderate (HCC)   Gastrointestinal bleed   PAF (paroxysmal atrial fibrillation) (HCC)   Acute blood loss anemia   History of gastritis   Irritable bowel syndrome with intermittent diarrhea   SOB (shortness of breath)   Anasarca   1. Left knee osteoarthritis sp total knee replacement. 05/11.  Improving mobility today.  Plan to continue PT and OT at [facility] in [facility], Continue pain control.   2. Postoperative ileus with partial small bowel obstruction.  Clinically resolved, Jesse [name] with no nausea or vomiting, and tolerating po well.  3.AKI on CKD stage 3a/ hypokalemia. negative fluid balance, his renal function has worsening cr up to 1,81, with K at 4,3 and serum bicarbonate at 28.  Jesse [name] had one dose of furosemide po this am, will hold on further furosemide for now, and follow up on renal function in am. Avoid hypotension and nephrotoxic medications.   4. Acute on chronic diastolic heart failure, atrial fibrillation with RVR, prolonged Qtc.  Telemetry personally reviewed, Jesse [name] has converted to sinus rhythm.  Continue rate control with metoprolol and amiodarone. Tolerating well aspirin. For now continue to hold on full anticoagulation, due to recent GI bleed.   5. Acute blood loss anemia due to candida esophagitis (upper GI bleed)/ GERD.  No further signs of bleeding, continue fluconazole.   6. T2DM. Dyslipidemia. Glucose continue to be well controlled, fasting this am was 114, Continue sliding scale for glucose cover and monitoring.  Continue with atorvastatin.   Status is: Inpatient  Remains inpatient appropriate because:Inpatient  level of care appropriate due to severity of illness   Dispo:  Jesse [name] From: Home  Planned Disposition: Inpatient Rehab  Medically stable for discharge: No     DVT prophylaxis: scd    Code Status:   full  Family Communication:  I spoke with Jesse [name]'s wife at the bedside, we talked in detail about Jesse [name]'s condition, plan of care and prognosis and all questions were addressed.    Consultants:   Orthopedics  GI   Cardiology   Procedures:   Left knee arthroplasty    Subjective: Jesse [name] is feeling better this am,. No nausea or vomiting and tolerating po well, has been out of bed and working with physical therapy.   Objective: Vitals:   02/04/21 2008 02/05/21 0409 02/05/21 0809 02/05/21 1028  BP: 110/63 121/63 117/72 (!) 146/123  Pulse: 77 73 70 79  Resp: (!) 21 19  14   Temp: 98.2 F (36.8 C) 97.7 F (36.5 C)  98 F (36.7 C)  TempSrc: Oral Oral    SpO2: 97% 99% 100% 97%  Weight:      Height:        Intake/Output Summary (Last 24 hours) at 02/05/2021 1354 Last data filed at 02/05/2021 1200 Gross per 24 hour  Intake 1690.6 ml  Output 700 ml  Net 990.6 ml   Filed Weights   01/30/21 1027 01/31/21 0500 02/02/21 0420  Weight: 112.2 kg 108.5 kg 100.4 kg    Examination:   General: Not in pain or dyspnea, deconditioned  Neurology: Awake and alert, non focal  E ENT: mild pallor, no icterus, oral mucosa moist Cardiovascular: No JVD. S1-S2 present, rhythmic, no gallops, rubs, or murmurs. No lower extremity edema. Pulmonary: positive breath sounds bilaterally, adequate air movement, no wheezing, rhonchi or rales. Gastrointestinal. Abdomen soft and non tender Skin. No rashes Musculoskeletal: no joint deformities     Data Reviewed: I have personally reviewed following labs and imaging studies  CBC: Recent Labs  Lab 01/30/21 0247 01/30/21 1006 01/31/21 0511 02/01/21 0259 02/02/21 0359 02/04/21 0340 02/05/21 0935  WBC 10.8*  --  10.0 8.0 7.4 9.1  --    NEUTROABS  --   --   --   --   --  6.2  --   HGB 7.7*  7.7*   < > 8.5* 8.0* 8.1* 8.1* 9.1*  HCT 24.9*  24.9*   < > 26.4* 25.5* 25.9* 26.2* 30.1*  MCV 96.5  --  94.3 94.1 94.9 94.9  --   PLT 202  --  202 231 263 311  --    < > = values in this interval not displayed.   Basic Metabolic Panel: Recent Labs  Lab 01/31/21 0242 02/01/21 0259 02/02/21 0359 02/03/21 0608 02/04/21 0340 02/05/21 0407  NA 144 145 142 138 137 136  K 3.7 4.0 3.6 4.4 4.1 4.3  CL 113* 113* 104 102 100 99  CO2 22 27 29 30 29 28   GLUCOSE 118* 140* 132* 133* 120* 114*  BUN 98* 87* 69* 60* 61* 59*  CREATININE 1.81* 1.71* 1.51* 1.56* 1.69* 1.81*  CALCIUM 8.3* 8.5* 8.6* 8.7* 8.8* 8.6*  MG 2.3 2.2 2.0 2.1 2.1  --   PHOS 3.9 3.8 4.0 4.5 5.3*  --    GFR: Estimated Creatinine Clearance: 40.5 mL/min (A) (by C-G formula based on SCr of 1.81 mg/dL (H)). Liver Function Tests: Recent Labs  Lab 01/31/21 0242 02/02/21 0359  02/04/21 0340  AST 29 29 27   ALT 72* 55* 49*  ALKPHOS 55 66 86  BILITOT 1.2 0.9 0.8  PROT 5.0* 5.5* 5.9*  ALBUMIN 2.0* 2.2* 2.4*   No results for input(s): LIPASE, AMYLASE in the last 168 hours. Recent Labs  Lab 01/30/21 1106 01/31/21 0510  AMMONIA 71* 21   Coagulation Profile: No results for input(s): INR, PROTIME in the last 168 hours. Cardiac Enzymes: No results for input(s): CKTOTAL, CKMB, CKMBINDEX, TROPONINI in the last 168 hours. BNP (last 3 results) No results for input(s): PROBNP in the last 8760 hours. HbA1C: No results for input(s): HGBA1C in the last 72 hours. CBG: Recent Labs  Lab 02/04/21 2011 02/04/21 2341 02/05/21 0404 02/05/21 0748 02/05/21 1134  GLUCAP 166* 138* 89 111* 129*   Lipid Profile: Recent Labs    02/04/21 0320  TRIG 77   Thyroid Function Tests: No results for input(s): TSH, T4TOTAL, FREET4, T3FREE, THYROIDAB in the last 72 hours. Anemia Panel: No results for input(s): VITAMINB12, FOLATE, FERRITIN, TIBC, IRON, RETICCTPCT in the last 72  hours.    Radiology Studies: I have reviewed all of the imaging during this hospital visit personally     Scheduled Meds: . (feeding supplement) PROSource Plus  30 mL Oral BID BM  . allopurinol  100 mg Oral QHS  . amiodarone  200 mg Oral Daily  . aspirin EC  81 mg Oral Daily  . atorvastatin  20 mg Oral Daily  . bisacodyl  10 mg Rectal Daily  . Chlorhexidine Gluconate Cloth  6 each Topical Daily  . docusate sodium  100 mg Oral Daily  . feeding supplement  1 Container Oral BID BM  . furosemide  40 mg Oral Daily  . insulin aspart  0-15 Units Subcutaneous Q4H  . lip balm  1 application Topical BID  . mouth rinse  15 mL Mouth Rinse BID  . metoCLOPramide (REGLAN) injection  5 mg Intravenous Q8H  . metoprolol succinate  25 mg Oral BID  . pantoprazole  40 mg Oral BID  . sodium chloride flush  10-40 mL Intracatheter Q12H   Continuous Infusions: . sodium chloride 10 mL/hr at 02/03/21 2046  . fluconazole (DIFLUCAN) IV 100 mg (02/05/21 1237)     LOS: 20 days        Liel Rudden Gerome Apley, MD

## 2021-02-05 NOTE — Progress Notes (Signed)
Nutrition Follow-up  DOCUMENTATION CODES:   Obesity unspecified  INTERVENTION:   -Boost Breeze po BID, each supplement provides 250 kcal and 9 grams of protein  -Prosource Plus PO BID, each provides 100 kcals and 15g protein  NUTRITION DIAGNOSIS:   Inadequate oral intake related to inability to eat as evidenced by NPO status.  Now on solid diet.  GOAL:   Patient will meet greater than or equal to 90% of their needs  Progressing.  MONITOR:   PO intake,Supplement acceptance,Diet advancement,Labs,Weight trends,Other (Comment) (TPN regimen)  ASSESSMENT:   75 year old male with medical history of DM, HLD, lymphoma, HTN, GERD, hypotestosteronemia, and sleep apnea. 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 He underwent L TKA on 5/11.  Significant Events: 5/11- admission; L TKA 5/15- NGT placement 5/18- initial RD assessment 5/19- double lumen PICC placed in R basilic; TPN initiation 5/53- diet advanced to CLD 5/23- NGT removal 5/26: s/p EGD -esophagitis d/t Candida 5/30- TPN stopped, diet advanced to heart healthy/CHO modified  Patient currently consuming 100% of meals. Pt accepting Boost Breeze and Prosource.  Admission weight: 226 lbs. Current weight: 221 lbs.  Medications: Colace, Lasix, Reglan  Labs reviewed: CBGs: 89-138  Diet Order:   Diet Order            Diet heart healthy/carb modified Room service appropriate? Yes; Fluid consistency: Thin  Diet effective now           Diet - low sodium heart healthy           Diet - low sodium heart healthy                 EDUCATION NEEDS:   Education needs have been addressed  Skin:  Skin Assessment: Skin Integrity Issues: Skin Integrity Issues:: Incisions Incisions: L knee (5/11)  Last BM:  5/24 (type 7 x1)  Height:   Ht Readings from Last 1 Encounters:  01/20/21 5\' 8"  (1.727 m)    Weight:   Wt Readings from Last 1 Encounters:  02/02/21 100.4 kg    BMI:  Body mass index  is 33.65 kg/m.  Estimated Nutritional Needs:   Kcal:  1880-2090 kcal  Protein:  90-100 grams  Fluid:  >/= 2 L/day  Clayton Bibles, MS, RD, LDN Inpatient Clinical Dietitian Contact information available via Amion

## 2021-02-05 NOTE — Progress Notes (Addendum)
Attending physician's note   I have taken an interval history, reviewed the chart and examined the patient. I agree with the Advanced Practitioner's note, impression, and recommendations as outlined.   Tolerating p.o. intake.  States he feels much better from a GI standpoint.  No nausea/vomiting.  - Continue Diflucan for Candida esophagitis - Continue scheduled IV Reglan for now.  If still doing okay from a GI standpoint tomorrow, change to scheduled PO 5 mg every 8 hours for 24-48 hours.  If still doing okay, titrate to prn use - Okay to start anticoagulation from a GI standpoint as recent EGD demonstrates Candida esophagitis and minimal gastritis, potentially 2/2 NGT but otherwise no high-grade stigmata.  Can monitor for s/s of bleeding after starting apixaban - GI service will sign off at this time.  Please do not hesitate to contact with additional questions or concerns  Hermione Havlicek, DO, FACG [phone] office             [facility] Progress Note  CC:  Ileus, GI bleed   Subjective:  He is feeling well at this time. He is tolerating a heart healthy diet. Still feels a bit weak. No N/V. No upper or lower abdominal pain. He stated passing a black stool yesterday around 2pm. He is passing gas per the rectum this am. His wife is at the bedside.   Objective:  Vital signs in last 24 hours: Temp:  [97.7 F (36.5 C)-98.7 F (37.1 C)] 97.7 F (36.5 C) (05/31 0409) Pulse Rate:  [70-77] 70 (05/31 0809) Resp:  [19-22] 19 (05/31 0409) BP: (110-123)/(57-72) 117/72 (05/31 0809) SpO2:  [95 %-100 %] 100 % (05/31 0809) Last BM Date: 02/04/21 General:   Alert 75 year old male in NAD.  Heart: RRR, no murmur.  Pulm:  Breath sounds clear throughout.  Abdomen: Soft, nondistended. + BX x 4 quads.  Extremities:  Bilateral LEs with 1+ edema. Left knee incision intact. LLE in ortho device.  Neurologic:  Alert and  oriented x4;  Grossly normal neurologically. Psych:  Alert  and cooperative. Normal mood and affect.  Intake/Output from previous day: 05/30 0701 - 05/31 0700 In: 1930.6 [P.O.:960; I.V.:919.7; IV Piggyback:51] Out: 1250 [Urine:1250] Intake/Output this shift: No intake/output data recorded.  Lab Results: Recent Labs    02/04/21 0340  WBC 9.1  HGB 8.1*  HCT 26.2*  PLT 311   BMET Recent Labs    02/03/21 0608 02/04/21 0340 02/05/21 0407  NA 138 137 136  K 4.4 4.1 4.3  CL 102 100 99  CO2 30 29 28   GLUCOSE 133* 120* 114*  BUN 60* 61* 59*  CREATININE 1.56* 1.69* 1.81*  CALCIUM 8.7* 8.8* 8.6*   LFT Recent Labs    02/04/21 0340  PROT 5.9*  ALBUMIN 2.4*  AST 27  ALT 49*  ALKPHOS 86  BILITOT 0.8   PT/INR No results for input(s): LABPROT, INR in the last 72 hours. Hepatitis Panel No results for input(s): HEPBSAG, HCVAB, HEPAIGM, HEPBIGM in the last 72 hours.  DG Chest Port 1 View  Result Date: 02/04/2021 CLINICAL DATA:  Shortness of breath EXAM: PORTABLE CHEST 1 VIEW COMPARISON:  01/27/2021 FINDINGS: Interval removal of esophagogastric tube. Otherwise unchanged examination with elevation of the left hemidiaphragm. No acute appearing airspace opacity. Right upper extremity PICC. IMPRESSION: 1.  Interval removal of esophagogastric tube. 2. Otherwise unchanged examination with elevation of the left hemidiaphragm. No acute appearing airspace opacity. Electronically Signed   By: Dorna Bloom.D.  On: 02/04/2021 19:45    Assessment / Plan:  13.  75 year old male s/p left TKA 01/16/2021 who developed a post opIleus which has significantly improved over the past few days. Today K+ 4.3.  TPN discontinued. Tolerating a heart healthy diet. Last BM was yesterday afternoon, passing gas per the rectum this am.  -OOB as tolerated -Turn Q 2 hours when in bed -Repeat BMP in am -Continue Reglan 5mg  IV Q 8 hours if ok with cardiology (patient has prolonged QTc 563) -Await further recommendations per Dr. Bryan Lemma  2. Upper GI bleed, coffee  ground output per NGT on 5/20. NGT removed. EGD 01/31/2021 showed Candida esophagitis and very minimal nonspecific gastritis. Hg stable 8.1 on 5/30.  -Await H/H results  -CBC in am  -Continue PPI po bid -Continue Diflucan 100mg  IV Q 24hrs -Monitor patient closely for active GI bleeding. Nursing staff to document color and frequency of BMs.   3. A. Fib with RVR. CHADs-vasc 5. On Amiodarone po and Metoprolol.  -Cardiology planning on starting Apixaban 5mg  po bid once cleared by our GI service   4. Nonobstructive CAD on ASA 81mg  QD.   5. Acute diastolic CHF  Principal Problem:   Ileus ([place]) Active Problems:   Type 2 diabetes mellitus without complication, without long-term current use of insulin (HCC)   Essential hypertension   Obesity (BMI 30-39.9)   CAD (coronary artery disease)   Pulmonary HTN (HCC)   S/P TKR (total knee replacement) using cement   Acute respiratory distress   AKI (acute kidney injury) ([place])   Acute renal failure (HCC)   Benign prostatic hyperplasia   Obstructive sleep apnea treated with continuous positive airway pressure (CPAP)   Hypotension   GERD (gastroesophageal reflux disease)   Protein-calorie malnutrition, moderate (HCC)   Gastrointestinal bleed   PAF (paroxysmal atrial fibrillation) (HCC)   Acute blood loss anemia   History of gastritis   Irritable bowel syndrome with intermittent diarrhea   SOB (shortness of breath)   Anasarca     LOS: 20 days   [name]  02/05/2021, 9:41 AM

## 2021-02-06 ENCOUNTER — Other Ambulatory Visit (HOSPITAL_COMMUNITY): Payer: Self-pay

## 2021-02-06 DIAGNOSIS — R0603 Acute respiratory distress: Secondary | ICD-10-CM | POA: Diagnosis not present

## 2021-02-06 DIAGNOSIS — K567 Ileus, unspecified: Secondary | ICD-10-CM | POA: Diagnosis not present

## 2021-02-06 DIAGNOSIS — D62 Acute posthemorrhagic anemia: Secondary | ICD-10-CM | POA: Diagnosis not present

## 2021-02-06 DIAGNOSIS — N179 Acute kidney failure, unspecified: Secondary | ICD-10-CM | POA: Diagnosis not present

## 2021-02-06 LAB — BASIC METABOLIC PANEL
Anion gap: 10 (ref 5–15)
BUN: 59 mg/dL — ABNORMAL HIGH (ref 8–23)
CO2: 25 mmol/L (ref 22–32)
Calcium: 8.6 mg/dL — ABNORMAL LOW (ref 8.9–10.3)
Chloride: 100 mmol/L (ref 98–111)
Creatinine, Ser: 1.77 mg/dL — ABNORMAL HIGH (ref 0.61–1.24)
GFR, Estimated: 40 mL/min — ABNORMAL LOW (ref >60)
Glucose, Bld: 146 mg/dL — ABNORMAL HIGH (ref 70–99)
Potassium: 4.2 mmol/L (ref 3.5–5.1)
Sodium: 135 mmol/L (ref 135–145)

## 2021-02-06 LAB — GLUCOSE, CAPILLARY
Glucose-Capillary: 115 mg/dL — ABNORMAL HIGH (ref 70–99)
Glucose-Capillary: 152 mg/dL — ABNORMAL HIGH (ref 70–99)
Glucose-Capillary: 154 mg/dL — ABNORMAL HIGH (ref 70–99)
Glucose-Capillary: 86 mg/dL (ref 70–99)

## 2021-02-06 LAB — SARS CORONAVIRUS 2 (TAT 6-24 HRS): SARS Coronavirus 2: NEGATIVE

## 2021-02-06 MED ORDER — FLUCONAZOLE 100 MG PO TABS
100.0000 mg | ORAL_TABLET | Freq: Every day | ORAL | Status: DC
  Administered 2021-02-06: 100 mg via ORAL
  Filled 2021-02-06: qty 1

## 2021-02-06 MED ORDER — FLUCONAZOLE 100 MG PO TABS
100.0000 mg | ORAL_TABLET | Freq: Every day | ORAL | 0 refills | Status: AC
  Filled 2021-02-06: qty 4, 4d supply, fill #0

## 2021-02-06 MED ORDER — APIXABAN 5 MG PO TABS
5.0000 mg | ORAL_TABLET | Freq: Two times a day (BID) | ORAL | Status: DC
  Administered 2021-02-06: 5 mg via ORAL
  Filled 2021-02-06: qty 1

## 2021-02-06 MED ORDER — ACETAMINOPHEN 325 MG PO TABS: 650.0000 mg | ORAL_TABLET | Freq: Four times a day (QID) | ORAL | Status: AC | PRN

## 2021-02-06 MED ORDER — PROSOURCE PLUS PO LIQD
30.0000 mL | Freq: Two times a day (BID) | ORAL | 0 refills | Status: AC
  Filled 2021-02-06: qty 1800, 30d supply, fill #0

## 2021-02-06 MED ORDER — AMIODARONE HCL 200 MG PO TABS
200.0000 mg | ORAL_TABLET | Freq: Every day | ORAL | 0 refills | Status: DC
  Filled 2021-02-06: qty 30, 30d supply, fill #0

## 2021-02-06 MED ORDER — FUROSEMIDE 40 MG PO TABS: 40.0000 mg | ORAL_TABLET | Freq: Every day | ORAL | Status: DC

## 2021-02-06 MED ORDER — FUROSEMIDE 40 MG PO TABS
40.0000 mg | ORAL_TABLET | Freq: Every day | ORAL | 0 refills | Status: AC
  Filled 2021-02-06: qty 30, 30d supply, fill #0

## 2021-02-06 MED ORDER — OXYCODONE HCL 5 MG PO TABS: 5.0000 mg | ORAL_TABLET | ORAL | 0 refills | Status: DC | PRN

## 2021-02-06 MED ORDER — APIXABAN 5 MG PO TABS
5.0000 mg | ORAL_TABLET | Freq: Two times a day (BID) | ORAL | 0 refills | Status: DC
  Filled 2021-02-06: qty 60, 30d supply, fill #0

## 2021-02-06 MED ORDER — METOPROLOL SUCCINATE ER 25 MG PO TB24
25.0000 mg | ORAL_TABLET | Freq: Two times a day (BID) | ORAL | 0 refills | Status: AC
  Filled 2021-02-06: qty 60, 30d supply, fill #0

## 2021-02-06 NOTE — Discharge Instructions (Signed)
Elevate leg above heart 6x a day for 20minutes each Use knee immobilizer while walking until can SLR x 10 Use knee immobilizer in bed to keep knee in extension Aquacel dressing may remain in place until follow up. May shower with aquacel dressing in place. If the dressing becomes saturated or peels off, you may remove aquacel dressing. Do not remove steri-strips if they are present. Place new dressing with gauze and tape or ACE bandage which should be kept clean and dry and changed daily.  INSTRUCTIONS AFTER JOINT REPLACEMENT   o Remove items at home which could result in a fall. This includes throw rugs or furniture in walking pathways o ICE to the affected joint every three hours while awake for 30 minutes at a time, for at least the first 3-5 days, and then as needed for pain and swelling.  Continue to use ice for pain and swelling. You may notice swelling that will progress down to the foot and ankle.  This is normal after surgery.  Elevate your leg when you are not up walking on it.   o Continue to use the breathing machine you got in the hospital (incentive spirometer) which will help keep your temperature down.  It is common for your temperature to cycle up and down following surgery, especially at night when you are not up moving around and exerting yourself.  The breathing machine keeps your lungs expanded and your temperature down.   DIET:  As you were doing prior to hospitalization, we recommend a well-balanced diet.  DRESSING / WOUND CARE / SHOWERING  Keep the surgical dressing until follow up.  The dressing is water proof, so you can shower without any extra covering.  IF THE DRESSING FALLS OFF or the wound gets wet inside, change the dressing with sterile gauze.  Please use good hand washing techniques before changing the dressing.  Do not use any lotions or creams on the incision until instructed by your surgeon.    ACTIVITY  o Increase activity slowly as tolerated, but follow the  weight bearing instructions below.   o No driving for 6 weeks or until further direction given by your physician.  You cannot drive while taking narcotics.  o No lifting or carrying greater than 10 lbs. until further directed by your surgeon. o Avoid periods of inactivity such as sitting longer than an hour when not asleep. This helps prevent blood clots.  o You may return to work once you are authorized by your doctor.     WEIGHT BEARING   Weight bearing as tolerated with assist device (walker, cane, etc) as directed, use it as long as suggested by your surgeon or therapist, typically at least 4-6 weeks.   EXERCISES  Results after joint replacement surgery are often greatly improved when you follow the exercise, range of motion and muscle strengthening exercises prescribed by your doctor. Safety measures are also important to protect the joint from further injury. Any time any of these exercises cause you to have increased pain or swelling, decrease what you are doing until you are comfortable again and then slowly increase them. If you have problems or questions, call your caregiver or physical therapist for advice.   Rehabilitation is important following a joint replacement. After just a few days of immobilization, the muscles of the leg can become weakened and shrink (atrophy).  These exercises are designed to build up the tone and strength of the thigh and leg muscles and to improve motion. Often   times heat used for twenty to thirty minutes before working out will loosen up your tissues and help with improving the range of motion but do not use heat for the first two weeks following surgery (sometimes heat can increase post-operative swelling).   These exercises can be done on a training (exercise) mat, on the floor, on a table or on a bed. Use whatever works the best and is most comfortable for you.    Use music or television while you are exercising so that the exercises are a pleasant  break in your day. This will make your life better with the exercises acting as a break in your routine that you can look forward to.   Perform all exercises about fifteen times, three times per day or as directed.  You should exercise both the operative leg and the other leg as well.  Exercises include:   . Quad Sets - Tighten up the muscle on the front of the thigh (Quad) and hold for 5-10 seconds.   . Straight Leg Raises - With your knee straight (if you were given a brace, keep it on), lift the leg to 60 degrees, hold for 3 seconds, and slowly lower the leg.  Perform this exercise against resistance later as your leg gets stronger.  . Leg Slides: Lying on your back, slowly slide your foot toward your buttocks, bending your knee up off the floor (only go as far as is comfortable). Then slowly slide your foot back down until your leg is flat on the floor again.  . Angel Wings: Lying on your back spread your legs to the side as far apart as you can without causing discomfort.  . Hamstring Strength:  Lying on your back, push your heel against the floor with your leg straight by tightening up the muscles of your buttocks.  Repeat, but this time bend your knee to a comfortable angle, and push your heel against the floor.  You may put a pillow under the heel to make it more comfortable if necessary.   A rehabilitation program following joint replacement surgery can speed recovery and prevent re-injury in the future due to weakened muscles. Contact your doctor or a physical therapist for more information on knee rehabilitation.    CONSTIPATION  Constipation is defined medically as fewer than three stools per week and severe constipation as less than one stool per week.  Even if you have a regular bowel pattern at home, your normal regimen is likely to be disrupted due to multiple reasons following surgery.  Combination of anesthesia, postoperative narcotics, change in appetite and fluid intake all can  affect your bowels.   YOU MUST use at least one of the following options; they are listed in order of increasing strength to get the job done.  They are all available over the counter, and you may need to use some, POSSIBLY even all of these options:    Drink plenty of fluids (prune juice may be helpful) and high fiber foods Colace 100 mg by mouth twice a day  Senokot for constipation as directed and as needed Dulcolax (bisacodyl), take with full glass of water  Miralax (polyethylene glycol) once or twice a day as needed.  If you have tried all these things and are unable to have a bowel movement in the first 3-4 days after surgery call either your surgeon or your primary doctor.    If you experience loose stools or diarrhea, hold the medications until you stool   forms back up.  If your symptoms do not get better within 1 week or if they get worse, check with your doctor.  If you experience "the worst abdominal pain ever" or develop nausea or vomiting, please contact the office immediately for further recommendations for treatment.   ITCHING:  If you experience itching with your medications, try taking only a single pain pill, or even half a pain pill at a time.  You can also use Benadryl over the counter for itching or also to help with sleep.   TED HOSE STOCKINGS:  Use stockings on both legs until for at least 2 weeks or as directed by physician office. They may be removed at night for sleeping.  MEDICATIONS:  See your medication summary on the "After Visit Summary" that nursing will review with you.  You may have some home medications which will be placed on hold until you complete the course of blood thinner medication.  It is important for you to complete the blood thinner medication as prescribed.  PRECAUTIONS:  If you experience chest pain or shortness of breath - call 911 immediately for transfer to the hospital emergency department.   If you develop a fever greater that 101 F, purulent  drainage from wound, increased redness or drainage from wound, foul odor from the wound/dressing, or calf pain - CONTACT YOUR SURGEON.                                                   FOLLOW-UP APPOINTMENTS:  If you do not already have a post-op appointment, please call the office for an appointment to be seen by your surgeon.  Guidelines for how soon to be seen are listed in your "After Visit Summary", but are typically between 1-4 weeks after surgery.  OTHER INSTRUCTIONS:   Knee Replacement:  Do not place pillow under knee, focus on keeping the knee straight while resting. CPM instructions: 0-90 degrees, 2 hours in the morning, 2 hours in the afternoon, and 2 hours in the evening. Place foam block, curve side up under heel at all times except when in CPM or when walking.  DO NOT modify, tear, cut, or change the foam block in any way.  POST-OPERATIVE OPIOID TAPER INSTRUCTIONS: . It is important to wean off of your opioid medication as soon as possible. If you do not need pain medication after your surgery it is ok to stop day one. . Opioids include: o Codeine, Hydrocodone(Norco, Vicodin), Oxycodone(Percocet, oxycontin) and hydromorphone amongst others.  . Long term and even short term use of opiods can cause: o Increased pain response o Dependence o Constipation o Depression o Respiratory depression o And more.  . Withdrawal symptoms can include o Flu like symptoms o Nausea, vomiting o And more . Techniques to manage these symptoms o Hydrate well o Eat regular healthy meals o Stay active o Use relaxation techniques(deep breathing, meditating, yoga) . Do Not substitute Alcohol to help with tapering . If you have been on opioids for less than two weeks and do not have pain than it is ok to stop all together.  . Plan to wean off of opioids o This plan should start within one week post op of your joint replacement. o Maintain the same interval or time between taking each dose and first  decrease the dose.  o Cut   the total daily intake of opioids by one tablet each day o Next start to increase the time between doses. o The last dose that should be eliminated is the evening dose.     MAKE SURE YOU:  . Understand these instructions.  . Get help right away if you are not doing well or get worse.    Thank you for letting us be a part of your medical care team.  It is a privilege we respect greatly.  We hope these instructions will help you stay on track for a fast and full recovery!   Information on my medicine - ELIQUIS (apixaban)   Why was Eliquis prescribed for you? Eliquis was prescribed for you to reduce the risk of a blood clot forming that can cause a stroke if you have a medical condition called atrial fibrillation (a type of irregular heartbeat).  What do You need to know about Eliquis ? Take your Eliquis TWICE DAILY - one tablet in the morning and one tablet in the evening with or without food. If you have difficulty swallowing the tablet whole please discuss with your pharmacist how to take the medication safely.  Take Eliquis exactly as prescribed by your doctor and DO NOT stop taking Eliquis without talking to the doctor who prescribed the medication.  Stopping may increase your risk of developing a stroke.  Refill your prescription before you run out.  After discharge, you should have regular check-up appointments with your healthcare provider that is prescribing your Eliquis.  In the future your dose may need to be changed if your kidney function or weight changes by a significant amount or as you get older.  What do you do if you miss a dose? If you miss a dose, take it as soon as you remember on the same day and resume taking twice daily.  Do not take more than one dose of ELIQUIS at the same time to make up a missed dose.  Important Safety Information A possible side effect of Eliquis is bleeding. You should call your healthcare provider right  away if you experience any of the following: ? Bleeding from an injury or your nose that does not stop. ? Unusual colored urine (red or dark brown) or unusual colored stools (red or black). ? Unusual bruising for unknown reasons. ? A serious fall or if you hit your head (even if there is no bleeding).  Some medicines may interact with Eliquis and might increase your risk of bleeding or clotting while on Eliquis. To help avoid this, consult your healthcare provider or pharmacist prior to using any new prescription or non-prescription medications, including herbals, vitamins, non-steroidal anti-inflammatory drugs (NSAIDs) and supplements.  This website has more information on Eliquis (apixaban): http://www.eliquis.com/eliquis/home

## 2021-02-06 NOTE — Plan of Care (Signed)
  Problem: Education: Goal: Knowledge of General Education information will improve Description: Including pain rating scale, medication(s)/side effects and non-pharmacologic comfort measures Outcome: Completed/Met   Problem: Health Behavior/Discharge Planning: Goal: Ability to manage health-related needs will improve Outcome: Completed/Met   Problem: Clinical Measurements: Goal: Ability to maintain clinical measurements within normal limits will improve Outcome: Completed/Met Goal: Will remain free from infection Outcome: Completed/Met Goal: Diagnostic test results will improve Outcome: Completed/Met Goal: Respiratory complications will improve Outcome: Completed/Met Goal: Cardiovascular complication will be avoided Outcome: Completed/Met   Problem: Activity: Goal: Risk for activity intolerance will decrease Outcome: Completed/Met   Problem: Nutrition: Goal: Adequate nutrition will be maintained Outcome: Completed/Met   Problem: Coping: Goal: Level of anxiety will decrease Outcome: Completed/Met   Problem: Elimination: Goal: Will not experience complications related to bowel motility Outcome: Completed/Met Goal: Will not experience complications related to urinary retention Outcome: Completed/Met   Problem: Pain Managment: Goal: General experience of comfort will improve Outcome: Completed/Met   Problem: Safety: Goal: Ability to remain free from injury will improve Outcome: Completed/Met   Problem: Skin Integrity: Goal: Risk for impaired skin integrity will decrease Outcome: Completed/Met   Problem: Education: Goal: Knowledge of the prescribed therapeutic regimen will improve Outcome: Completed/Met Goal: Individualized Educational Video(s) Outcome: Completed/Met   Problem: Activity: Goal: Ability to avoid complications of mobility impairment will improve Outcome: Completed/Met Goal: Range of joint motion will improve Outcome: Completed/Met   Problem:  Clinical Measurements: Goal: Postoperative complications will be avoided or minimized Outcome: Completed/Met   Problem: Pain Management: Goal: Pain level will decrease with appropriate interventions Outcome: Completed/Met   Problem: Skin Integrity: Goal: Will show signs of wound healing Outcome: Completed/Met

## 2021-02-06 NOTE — Progress Notes (Signed)
Subjective: 21 days s/p L TKA Patient reports pain as mild.   Seen by Dr [name]  Objective: Vital signs in last 24 hours: Temp:  [98 F (36.7 C)-98.6 F (37 C)] 98 F (36.7 C) (06/01 0358) Pulse Rate:  [70-77] 73 (06/01 1043) Resp:  [18-22] 22 (06/01 0358) BP: (99-130)/(50-65) 130/56 (06/01 1043) SpO2:  [95 %-99 %] 97 % (06/01 0358) Weight:  [99.3 kg] 99.3 kg (06/01 1053)  Intake/Output from previous day: 05/31 0701 - 06/01 0700 In: 581.7 [P.O.:340; I.V.:191.7; IV Piggyback:50] Out: 2125 [Urine:2125] Intake/Output this shift: No intake/output data recorded.  Recent Labs    02/04/21 0340 02/05/21 0935  HGB 8.1* 9.1*   Recent Labs    02/04/21 0340 02/05/21 0935  WBC 9.1  --   RBC 2.76*  --   HCT 26.2* 30.1*  PLT 311  --    Recent Labs    02/05/21 0407 02/06/21 0355  NA 136 135  K 4.3 4.2  CL 99 100  CO2 28 25  BUN 59* 59*  CREATININE 1.81* 1.77*  GLUCOSE 114* 146*  CALCIUM 8.6* 8.6*   No results for input(s): LABPT, INR in the last 72 hours.  Neurologically intact ABD soft Neurovascular intact Sensation intact distally Intact pulses distally Dorsiflexion/Plantar flexion intact Incision: no drainage No cellulitis present Compartment soft no sign of DVT   Assessment/Plan: 6 Days Post-Op Procedure(s) (LRB): ESOPHAGOGASTRODUODENOSCOPY (EGD) WITH PROPOFOL (N/A) Advance diet Up with therapy Eventual D/C to SNF when stable medically   Cecilie Kicks 02/06/2021, 12:00 PM

## 2021-02-06 NOTE — TOC Transition Note (Signed)
Transition of Care [facility]) - CM/SW Discharge Note   Patient Details  Name: [name] MRN: [id number] Date of Birth: 1946-01-13  Transition of [facility]) CM/SW Contact:  Trish Mage, LCSW Phone Number: 02/06/2021, 10:20 AM   Clinical Narrative:   Patient who is stable for d/c will transfer to [facility] Intensive Rehab Unit.  Family alerted.  PTAR arranged.  Nursing, please call report to [phone], room 3414. TOC sign off.    Final next level of care: IP Rehab Facility Barriers to Discharge: Barriers Resolved   Patient Goals and CMS Choice Patient states their goals for this hospitalization and ongoing recovery are:: short term SNF rehab and then home CMS Medicare.gov Compare Post Acute Care list provided to:: Patient Choice offered to / list presented to : Patient  Discharge Placement                       Discharge Plan and Services In-house Referral: Clinical Social Work   Post Acute Care Choice: [place]          DME Arranged: 3-N-1,Walker rolling DME Agency: [employer] Date DME Agency Contacted: 01/19/21 Time DME Agency Contacted: [id number] Representative spoke with at DME Agency: [place]: PT [place]: [facility] office) Date [place]: 01/18/21 Time [place]: 1000 Representative spoke with at [place]: [place] (ph# [phone]; fax # [phone])  Social Determinants of Health ([place]) Interventions     Readmission Risk Interventions Readmission Risk Prevention Plan 01/17/2021  Transportation Screening Complete  PCP or Specialist Appt within 5-7 Days Complete  Home Care Screening Complete  Some recent data might be hidden

## 2021-02-06 NOTE — Progress Notes (Signed)
Physical Therapy Treatment Patient Details Name: [name] MRN: [id number] DOB: 17-Apr-1946 Today's Date: 02/06/2021    History of Present Illness Pt is a 75 year old male s/p left TKA on 01/16/21. Transferred to ICU on 5/15 due to respiratory failure, ileus, kidney failure.    PT Comments    Pt sitting in recliner with feet on the floor on arrival (at least 90* knee flexion).  Pt requesting to use BSC prior to ambulating so encouraged ambulating into bathroom.  Pt fatigued quickly however after using bathroom and assisted back to bed.  Pt overall looking much improved and requiring less physical assist since this therapist's previous visit (when pt was in ICU).  Pt eager for d/c today.   Follow Up Recommendations  SNF (prefers facility in [place])     Clinical biochemist with 5" wheels    Recommendations for Other Services       Precautions / Restrictions Precautions Precautions: Fall;Knee Restrictions LLE Weight Bearing: Weight bearing as tolerated    Mobility  Bed Mobility Overal bed mobility: Needs Assistance Bed Mobility: Sit to Supine       Sit to supine: Supervision;HOB elevated   General bed mobility comments: increased time    Transfers Overall transfer level: Needs assistance Equipment used: Rolling walker (2 wheeled) Transfers: Sit to/from Stand Sit to Stand: Min assist         General transfer comment: verbal cues for hand placement, assist to rise and steady  Ambulation/Gait Ambulation/Gait assistance: Min assist Gait Distance (Feet): 8 Feet (x2) Assistive device: Rolling walker (2 wheeled) Gait Pattern/deviations: Step-through pattern;Decreased stride length     General Gait Details: pt requesting to use BSC however encouraged him to ambulate into bathroom,  pt ambulated to/from bathroom however fatigued quickly (had BM)   Stairs             Wheelchair Mobility    Modified Rankin (Stroke Patients Only)        Balance                                            Cognition Arousal/Alertness: Awake/alert Behavior During Therapy: WFL for tasks assessed/performed Overall Cognitive Status: Within Functional Limits for tasks assessed                                 General Comments: AxO x 3 pleasant, fatigues quickly      Exercises      General Comments        Pertinent Vitals/Pain Pain Assessment: Faces Faces Pain Scale: Hurts a little bit Pain Location: L knee with mobility Pain Descriptors / Indicators: Sore Pain Intervention(s): Repositioned;Monitored during session    Home Living                      Prior Function            PT Goals (current goals can now be found in the care plan section) Progress towards PT goals: Progressing toward goals    Frequency    Min 5X/week      PT Plan Current plan remains appropriate    Co-evaluation              AM-PAC PT "6 Clicks" Mobility   Outcome Measure  Help needed turning  from your back to your side while in a flat bed without using bedrails?: A Little Help needed moving from lying on your back to sitting on the side of a flat bed without using bedrails?: A Little Help needed moving to and from a bed to a chair (including a wheelchair)?: A Little Help needed standing up from a chair using your arms (e.g., wheelchair or bedside chair)?: A Lot Help needed to walk in hospital room?: A Lot Help needed climbing 3-5 steps with a railing? : A Lot 6 Click Score: 15    End of Session Equipment Utilized During Treatment: Gait belt Activity Tolerance: Patient limited by fatigue Patient left: in bed;with call bell/phone within reach;with bed alarm set Nurse Communication: Mobility status PT Visit Diagnosis: Other abnormalities of gait and mobility (R26.89)     Time: 8937-3428 PT Time Calculation (min) (ACUTE ONLY): 15 min  Charges:  $Gait Training: 8-22 mins                      Arlyce Dice, DPT Acute Rehabilitation Services Pager: [phone] Office: [place] 02/06/2021, 10:37 AM

## 2021-02-06 NOTE — Progress Notes (Signed)
ANTICOAGULATION CONSULT NOTE - Initial Consult  Pharmacy Consult for apixaban Indication: atrial fibrillation  Allergies  Allergen Reactions  . Penicillins Hives and Other (See Comments)    "cillins" Has patient had a PCN reaction causing immediate rash, facial/tongue/throat swelling, SOB or lightheadedness with hypotension: unknown Has patient had a PCN reaction causing severe rash involving mucus membranes or skin necrosis: unknown Has patient had a PCN reaction that required hospitalization no Has patient had a PCN reaction occurring within the last 10 years:no If all of the above answers are "NO", then may proceed with Cephalosporin use.   . Nsaids Other (See Comments)    Gastritis & Upper GI bleed    Patient Measurements: Height: 5\' 8"  (172.7 cm) Weight: 100.4 kg (221 lb 5.5 oz) IBW/kg (Calculated) : 68.4  Vital Signs: Temp: 98 F (36.7 C) (06/01 0358) Temp Source: Oral (06/01 0358) BP: 110/62 (06/01 0358) Pulse Rate: 70 (06/01 0358)  Labs: Recent Labs    02/04/21 0340 02/05/21 0407 02/05/21 0935 02/06/21 0355  HGB 8.1*  --  9.1*  --   HCT 26.2*  --  30.1*  --   PLT 311  --   --   --   CREATININE 1.69* 1.81*  --  1.77*    Estimated Creatinine Clearance: 41.4 mL/min (A) (by C-G formula based on SCr of 1.77 mg/dL (H)).   Medical History: Past Medical History:  Diagnosis Date  . 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 Diabetes mellitus without complication ([place])   . GERD (gastroesophageal reflux disease)   . Hx of lymphoma   . Hyperlipidemia   . Hypertension   . Hypotestosteronemia   . Sleep apnea 2013   wears CPAP    Medications: No anticoagulants PTA  Assessment: Pt is a 75 yoM s/p TKA complicated by SBO and post-op ileus requiring TPN during admission. Pt was started on IV heparin for afib earlier during hospital admission, but anticoagulation was held starting 5/22 for GIB.   Cleared to resume anticoagulation from GI standpoint. Pharmacy  consulted to dose apixaban for atrial fibrillation.   Today, 02/06/21  CBC: Hgb low but stable, Plt WNL  SCr 1.77, age: 75, Wt: 99 kg  Plan:   Apixaban 5 mg PO BID  Monitor CBC, renal function  Coupon and medication education provided  Lenis Noon, PharmD 02/06/2021,10:45 AM

## 2021-02-06 NOTE — Discharge Summary (Addendum)
Physician Discharge Summary  [name] ZOX:[id number] DOB: 04/09/1946 DOA: 01/16/2021  PCP: Jesse Neighbors, NP  Admit date: 01/16/2021 Discharge date: 02/06/2021  Admitted From: Home  Disposition:  SNF/ CIR   Recommendations for Outpatient Follow-up and new medication changes:  1. Follow up with [name] in 7 to 10 days.  2. Follow up renal function in 7 days. 3. Lisinopril and HCTZ on hold for now due to risk of hypotension and worsening renal function.  4. Added amiodarone 200 mg daily and metoprolol XL decreased to 25 mg for heart rate control. 5.  Started on apixaban for anticoagulation, monitor for signs of bleeding.   Home Health: na   Equipment/Devices: na    Discharge Condition: stable  CODE STATUS: full  Diet recommendation:  Heart healthy and diabetic prudent.   Brief/Interim Summary: [name] admitted to the hospital with working diagnosis of left knee osteoarthritis, admitted for total knee replacement. Hospitalization complicated by small bowel obstruction, postoperative ileus, upper GI bleed, esophageal candidiasis, acute kidney injury, heart failure and atrial fibrillation.  75 year old male past medical history for hypertension, type IIdiabetesmellitus, peripheral artery disease, coronary artery disease, dyslipidemia and BPH who was admitted for left knee pain. Patient was admitted to the hospital for total knee replacement, on day 3 postoperative, patient developed increasing nausea and poor appetite.  05/15Patient developed hypotension and was transferred to stepdown unit. He was diagnosed with ileus and partial small bowel obstruction. Central venous catheter was placed and started on vasopressorsfor shock (hypovolemic).  He was diagnosed with acute hypoxic respiratory failure due to aspiration pneumonitis.   Hishospitalization was complicated by atrial fibrillation requiring amiodarone and heparin drip. On 5/20 he had coffee-ground emesis  through NG tube. Required 2 units packed red blood cell transfusion and heparin drip was discontinued 05/21.On 5/23 and 5/25 he required PRBC transfusion adding a total of 4 packed red blood cells in total.  Underwent EGD 5/26, finding esophagitis due to Candida. Started on antifungal therapy with good toleration.   On 5/27 his NG tube was removed. Underwent diuresis with furosemide for volume overload.  Converted on sinus rhythm.   Diet advanced with good toleration.  Patient transferred to CIR to continue physical therapy.   1. Left knee osteoarthritis sp total knee replacement on 01/16/2021.  Patient was admitted for elective procedure, he underwent a total knee arthroplasty with good toleration. His hospitalization was complicated by small bowel obstruction, GI bleed, heart failure, and atrial fibrillation. His medical problems were stabilized, he continued to work with physical therapy, currently patient be transferred to rehab for further recovery.  2.  Postoperative ileus, small bowel obstruction.  Patient developed small bowel obstruction, ileus, required nasogastric tube placement for decompression. He did develop shock due to hypovolemia, he received intravenous fluids along with vasopressors support. (sepsis was ruled out) Patient was placed on TPN for nutrition during his hospitalization. He regained hemodynamic stability, his bowel obstruction/ileus resolved, NG tube was removed and diet was advanced as tolerated.  Currently no further nausea or vomiting.  3.  Acute kidney injury on chronic kidney disease stage IIIa, hypokalemia.  Patient received supportive medical therapy, developed volume overload requiring diuresis. ACE inhibitor and hydrochlorothiazide are currently on hold. Plan to continue furosemide 40 minutes daily to start on June 2.  She will need close follow-up of kidney function electrolytes. For now hold on potassium supplements to avoid  hyperkalemia.  His discharge sodium is 135, potassium 4.2, chloride 100, bicarb 25, glucose 146, BUN 59,  creatinine 1.77.  4.  Acute on chronic diastolic heart failure exacerbation, paroxysmal atrial fibrillation with rapid ventricular response.  Prolonged QTC. Patient received diuresis for volume overload, he developed atrial fibrillation with rapid ventricular response. He was placed on amiodarone drip, AV blockade metoprolol along with heparin drip for anticoagulation.   Patient has converted to sinus rhythm, currently on p.o. amiodarone 200 mg daily, metoprolol XL 25 mg daily. At discharge patient has been placed on apixaban for anticoagulation.  His last EKG had 78 bpm, normal axis, right bundle branch block, manually corrected QTC 494, sinus rhythm, no significant ST segment or T wave changes.  5.  Acute blood loss anemia due to upper GI bleed, related to Candida esophagitis.  The patient required 4 units PRBC transfusion for acute anemia. He underwent endoscopy showing Candida esophagitis.  Very minimal nonspecific gastritis, no stigmata of active bleeding. Patient was placed on fluconazole intravenously, now transitioned to oral formulation for 4 more days to complete a 10-day course.  6.  Type 2 diabetes mellitus, dyslipidemia.  Patient was placed on insulin sliding scale for glucose coverage monitoring.  Continue atorvastatin.  7. Protein calorie malnutrition (moderate). Patient was placed on TPN for nutrition support.  Now tolerating po well, continue nutritional supplements.   8.  Acute hypoxic respiratory failure due to aspiration pneumonitis.  Patient received supplemental oxygen and antibiotic therapy with good toleration. At the time of his discharge his oxygenation is 99% on 2 L/min of supplemental oxygen per nasal cannula.   Discharge Diagnoses:  Principal Problem:   Ileus ([place]) Active Problems:   Type 2 diabetes mellitus without complication, without long-term  current use of insulin (HCC)   Essential hypertension   Obesity (BMI 30-39.9)   CAD (coronary artery disease)   Pulmonary HTN (HCC)   S/P TKR (total knee replacement) using cement   Acute respiratory distress   AKI (acute kidney injury) ([place])   Acute renal failure (HCC)   Benign prostatic hyperplasia   Obstructive sleep apnea treated with continuous positive airway pressure (CPAP)   Hypotension   GERD (gastroesophageal reflux disease)   Protein-calorie malnutrition, moderate (HCC)   Gastrointestinal bleed   PAF (paroxysmal atrial fibrillation) (HCC)   Acute blood loss anemia   History of gastritis   Irritable bowel syndrome with intermittent diarrhea   SOB (shortness of breath)   Anasarca   Ileus, postoperative ([place])   Candida esophagitis [facility])    Discharge Instructions  Discharge Instructions    Call MD / Call 911   Complete by: As directed    If you experience chest pain or shortness of breath, CALL 911 and be transported to the hospital emergency room.  If you develope a fever above 101 F, pus (white drainage) or increased drainage or redness at the wound, or calf pain, call your surgeon's office.   Call MD / Call 911   Complete by: As directed    If you experience chest pain or shortness of breath, CALL 911 and be transported to the hospital emergency room.  If you develope a fever above 101 F, pus (white drainage) or increased drainage or redness at the wound, or calf pain, call your surgeon's office.   Constipation Prevention   Complete by: As directed    Drink plenty of fluids.  Prune juice may be helpful.  You may use a stool softener, such as Colace (over the counter) 100 mg twice a day.  Use MiraLax (over the counter) for constipation as needed.  Constipation Prevention   Complete by: As directed    Drink plenty of fluids.  Prune juice may be helpful.  You may use a stool softener, such as Colace (over the counter) 100 mg twice a day.  Use MiraLax (over the counter)  for constipation as needed.   Diet - low sodium heart healthy   Complete by: As directed    Diet - low sodium heart healthy   Complete by: As directed    Increase activity slowly as tolerated   Complete by: As directed    Increase activity slowly as tolerated   Complete by: As directed    Post-operative opioid taper instructions:   Complete by: As directed    POST-OPERATIVE OPIOID TAPER INSTRUCTIONS: It is important to wean off of your opioid medication as soon as possible. If you do not need pain medication after your surgery it is ok to stop day one. Opioids include: Codeine, Hydrocodone(Norco, Vicodin), Oxycodone(Percocet, oxycontin) and hydromorphone amongst others.  Long term and even short term use of opiods can cause: Increased pain response Dependence Constipation Depression Respiratory depression And more.  Withdrawal symptoms can include Flu like symptoms Nausea, vomiting And more Techniques to manage these symptoms Hydrate well Eat regular healthy meals Stay active Use relaxation techniques(deep breathing, meditating, yoga) Do Not substitute Alcohol to help with tapering If you have been on opioids for less than two weeks and do not have pain than it is ok to stop all together.  Plan to wean off of opioids This plan should start within one week post op of your joint replacement. Maintain the same interval or time between taking each dose and first decrease the dose.  Cut the total daily intake of opioids by one tablet each day Next start to increase the time between doses. The last dose that should be eliminated is the evening dose.      Post-operative opioid taper instructions:   Complete by: As directed    POST-OPERATIVE OPIOID TAPER INSTRUCTIONS: It is important to wean off of your opioid medication as soon as possible. If you do not need pain medication after your surgery it is ok to stop day one. Opioids include: Codeine, Hydrocodone(Norco, Vicodin),  Oxycodone(Percocet, oxycontin) and hydromorphone amongst others.  Long term and even short term use of opiods can cause: Increased pain response Dependence Constipation Depression Respiratory depression And more.  Withdrawal symptoms can include Flu like symptoms Nausea, vomiting And more Techniques to manage these symptoms Hydrate well Eat regular healthy meals Stay active Use relaxation techniques(deep breathing, meditating, yoga) Do Not substitute Alcohol to help with tapering If you have been on opioids for less than two weeks and do not have pain than it is ok to stop all together.  Plan to wean off of opioids This plan should start within one week post op of your joint replacement. Maintain the same interval or time between taking each dose and first decrease the dose.  Cut the total daily intake of opioids by one tablet each day Next start to increase the time between doses. The last dose that should be eliminated is the evening dose.        Allergies as of 02/06/2021      Reactions   Penicillins Hives, Other (See Comments)   "cillins" Has patient had a PCN reaction causing immediate rash, facial/tongue/throat swelling, SOB or lightheadedness with hypotension: unknown Has patient had a PCN reaction causing severe rash involving mucus membranes or skin necrosis: unknown Has patient  had a PCN reaction that required hospitalization no Has patient had a PCN reaction occurring within the last 10 years:no If all of the above answers are "NO", then may proceed with Cephalosporin use.   Nsaids Other (See Comments)   Gastritis & Upper GI bleed      Medication List    STOP taking these medications   doxazosin 8 MG tablet Commonly known as: CARDURA   Fish Oil 1200 MG Caps   hydrochlorothiazide 25 MG tablet Commonly known as: HYDRODIURIL   lisinopril 40 MG tablet Commonly known as: ZESTRIL   metoprolol tartrate 25 MG tablet Commonly known as: LOPRESSOR   potassium  chloride 10 MEQ tablet Commonly known as: KLOR-CON   vitamin E 180 MG (400 UNITS) capsule     TAKE these medications   (feeding supplement) PROSource Plus liquid Take 30 mLs by mouth 2 (two) times daily between meals.   acetaminophen 325 MG tablet Commonly known as: TYLENOL Take 2 tablets (650 mg total) by mouth every 6 (six) hours as needed for mild pain.   allopurinol 100 MG tablet Commonly known as: ZYLOPRIM Take 100 mg by mouth at bedtime.   amiodarone 200 MG tablet Commonly known as: PACERONE Take 1 tablet (200 mg total) by mouth daily.   apixaban 5 MG Tabs tablet Commonly known as: ELIQUIS Take 1 tablet (5 mg total) by mouth 2 (two) times daily.   atorvastatin 20 MG tablet Commonly known as: LIPITOR Take 20 mg by mouth every evening.   BIOFREEZE EX Apply 1 application topically 4 (four) times daily as needed (knee pain.).   Calcium 600+D 600-800 MG-UNIT Tabs Generic drug: Calcium Carb-Cholecalciferol Take 1 tablet by mouth in the morning.   diclofenac Sodium 1 % Gel Commonly known as: VOLTAREN Apply 1 application topically 4 (four) times daily as needed (knee pain).   docusate sodium 100 MG capsule Commonly known as: COLACE Take 1 capsule (100 mg total) by mouth 2 (two) times daily as needed for mild constipation.   fluconazole 100 MG tablet Commonly known as: DIFLUCAN Take 1 tablet (100 mg total) by mouth daily for 4 days.   furosemide 40 MG tablet Commonly known as: LASIX Take 1 tablet (40 mg total) by mouth daily. Start taking on: February 07, 2021 What changed:   medication strength  how much to take  when to take this  reasons to take this   ipratropium-albuterol 0.5-2.5 (3) MG/3ML Soln Commonly known as: DUONEB Inhale 3 mLs into the lungs every 6 (six) hours as needed (illness (colds)/respiratory).   metFORMIN 500 MG 24 hr tablet Commonly known as: GLUCOPHAGE-XR Take 500 mg by mouth at bedtime.   metoprolol succinate 25 MG 24 hr  tablet Commonly known as: TOPROL-XL Take 1 tablet (25 mg total) by mouth 2 (two) times daily at 10 AM and 5 PM. What changed:   medication strength  how much to take  when to take this  additional instructions   multivitamin with minerals Tabs tablet Take 1 tablet by mouth in the morning. Centrum Silver   oxybutynin 5 MG tablet Commonly known as: DITROPAN Take 5 mg by mouth in the morning.   oxyCODONE 5 MG immediate release tablet Commonly known as: Oxy IR/ROXICODONE Take 1 tablet (5 mg total) by mouth every 4 (four) hours as needed for severe pain.   OXYGEN Inhale 2.5 L into the lungs at bedtime.   pantoprazole 40 MG tablet Commonly known as: PROTONIX Take 1 tablet (40 mg total) by  mouth daily. What changed: when to take this   polyethylene glycol 17 g packet Commonly known as: MIRALAX / GLYCOLAX Mix 17 g with 8 ounces of liquid and take by mouth daily as needed for mild constipation.   spironolactone 25 MG tablet Commonly known as: ALDACTONE Take 1 tablet (25 mg total) by mouth at bedtime.   Testosterone Cypionate 200 MG/ML Kit Inject 200 mg into the muscle every 14 (fourteen) days.   VITAMIN B50 COMPLEX PO Take 1 capsule by mouth in the morning.   vitamin C 1000 MG tablet Take 1,000 mg by mouth in the morning.   Vitamin D3 10 MCG (400 UNIT) tablet Take 400 Units by mouth in the morning.            Durable Medical Equipment  (From admission, onward)         Start     Ordered   01/25/21 0700  For home use only DME standard manual wheelchair with seat cushion  Once       Comments: Patient suffers from ambulatory dysfunction which impairs their ability to perform daily activities like walking in the home.  A walker will not resolve issue with performing activities of daily living. A wheelchair will allow patient to safely perform daily activities. Patient can safely propel the wheelchair in the home or has a caregiver who can provide assistance. Length  of need 6 months. Accessories: elevating leg rests, wheel locks, extensions and anti-tippers.   01/25/21 0548   01/19/21 1325  For home use only DME Walker rolling  Once       Question Answer Comment  Walker: With 5 Inch Wheels   Patient needs a walker to treat with the following condition S/P total knee arthroplasty      01/19/21 1324   01/19/21 1325  For home use only DME 3 n 1  Once        01/19/21 1324          Follow-up Information    Susa Day, MD Follow up in 2 week(s).   Specialty: Orthopedic Surgery Contact information: [address] [place] [address] [phone]        [facility] Follow up.   Why: to provide home health physical therapy Contact information: [phone]             Allergies  Allergen Reactions  . Penicillins Hives and Other (See Comments)    "cillins" Has patient had a PCN reaction causing immediate rash, facial/tongue/throat swelling, SOB or lightheadedness with hypotension: unknown Has patient had a PCN reaction causing severe rash involving mucus membranes or skin necrosis: unknown Has patient had a PCN reaction that required hospitalization no Has patient had a PCN reaction occurring within the last 10 years:no If all of the above answers are "NO", then may proceed with Cephalosporin use.   . Nsaids Other (See Comments)    Gastritis & Upper GI bleed    Consultations:  Critical care  GI    Procedures/Studies: CT ABDOMEN PELVIS WO CONTRAST  Result Date: 01/30/2021 CLINICAL DATA:  Ileus. EXAM: CT ABDOMEN AND PELVIS WITHOUT CONTRAST TECHNIQUE: Multidetector CT imaging of the abdomen and pelvis was performed following the standard protocol without IV contrast. COMPARISON:  01/26/2021 FINDINGS: Lower chest: Bibasilar atelectasis, left greater than right. Stable elevation of the left hemidiaphragm. Extensive coronary artery calcification. Hypoattenuation of the cardiac blood pool is in keeping  with at least mild anemia. Mild global cardiomegaly again noted. Hepatobiliary:  No focal liver abnormality is seen. No gallstones, gallbladder wall thickening, or biliary dilatation. Pancreas: Unremarkable Spleen: Unremarkable Adrenals/Urinary Tract: The adrenal glands are unremarkable. The kidneys are normal in size and position. Multiple simple cortical cysts are seen bilaterally measuring up to 4.2 cm in diameter on the right and 4.5 cm in diameter on the left. There is no hydronephrosis. No intrarenal or ureteral calcifications are seen. The bladder is unremarkable. Stomach/Bowel: The stomach is distended. The small bowel is diffusely mildly dilated and gas and fluid-filled. The ascending colon and transverse colon are, additionally, mildly distended with gas. The findings, together, are in keeping with an underlying ileus. Moderate to severe sigmoid diverticulosis. The appendix is unremarkable. No free intraperitoneal gas. Trace ascites is present. Vascular/Lymphatic: Mild aortoiliac atherosclerotic calcification. No aortic aneurysm. No pathologic adenopathy within the abdomen and pelvis. Reproductive: Moderate prostatic enlargement. Seminal vesicles are unremarkable. Other: There is moderate diffuse subcutaneous body wall edema. No abdominal wall hernia. The rectum contains oral contrast from prior CT examination of 01/26/2021. Musculoskeletal: Osseous structures are age-appropriate. No acute bone abnormality. No lytic or blastic bone lesion. IMPRESSION: Stable elevation of the left hemidiaphragm. Stable cardiomegaly.  Findings in keeping with at least mild anemia. Interval removal of the nasogastric tube with distension of the stomach. Diffusely distended small and large bowel in keeping with underlying ileus, similar to that noted on prior examination. Diffuse body wall subcutaneous edema and mild ascites in keeping with anasarca. Aortic Atherosclerosis (ICD10-I70.0). Electronically Signed   By: Fidela Salisbury MD   On: 01/30/2021 01:58   CT ABDOMEN PELVIS WO CONTRAST  Result Date: 01/23/2021 CLINICAL DATA:  Abdominal distension. EXAM: CT ABDOMEN AND PELVIS WITHOUT CONTRAST TECHNIQUE: Multidetector CT imaging of the abdomen and pelvis was performed following the standard protocol without IV contrast. COMPARISON:  March 05, 2020. FINDINGS: Lower chest: Mild bibasilar subsegmental atelectasis is noted, left greater than right. Hepatobiliary: No focal liver abnormality is seen. No gallstones, gallbladder wall thickening, or biliary dilatation. Pancreas: Unremarkable. No pancreatic ductal dilatation or surrounding inflammatory changes. Spleen: Normal in size without focal abnormality. Adrenals/Urinary Tract: Adrenal glands appear normal. Bilateral renal cysts are noted. No renal or ureteral calculi are noted. No hydronephrosis or renal obstruction is noted. Urinary bladder is decompressed secondary to Foley catheter. Stomach/Bowel: Nasogastric tube tip is seen in distal stomach. Stomach and appendix are unremarkable. Dilated small bowel loops are noted in the pelvis concerning for ileus or distal small bowel obstruction. No colonic dilatation is noted. Vascular/Lymphatic: Aortic atherosclerosis. No enlarged abdominal or pelvic lymph nodes. Reproductive: Prostate is unremarkable. Other: No abdominal wall hernia or abnormality. No abdominopelvic ascites. Musculoskeletal: No acute or significant osseous findings. IMPRESSION: Dilated small bowel loops are noted in the pelvis and lower abdomen concerning for ileus or distal small bowel obstruction. Mild bibasilar subsegmental atelectasis, left greater than right. Aortic Atherosclerosis (ICD10-I70.0). Electronically Signed   By: Marijo Conception M.D.   On: 01/23/2021 10:04   DG Chest 1 View  Result Date: 01/20/2021 CLINICAL DATA:  Central catheter placement EXAM: CHEST  1 VIEW COMPARISON:  Chest CT March 19, 2017; abdominal radiographs Jan 20, 2021 FINDINGS: Nasogastric  tube tip and side port in stomach. Central catheter tip in superior vena cava near cavoatrial junction. No pneumothorax. There is airspace opacity in the left base. Lungs elsewhere are clear. Heart is upper normal in size with pulmonary vascularity normal. No adenopathy. No bone lesions. Loops of dilated bowel again noted in upper abdomen region. IMPRESSION: Tube  and catheter positions as described without pneumothorax. Airspace opacity consistent with pneumonia left base. Lungs elsewhere clear. Heart upper normal in size. Loops of bowel dilated in upper abdomen, demonstrated on abdomen radiographs from earlier in the day. Electronically Signed   By: Lowella Grip III M.D.   On: 01/20/2021 15:45   DG Knee 2 Views Left  Result Date: 01/16/2021 CLINICAL DATA:  Status post total knee replacement EXAM: LEFT KNEE - 1-2 VIEW COMPARISON:  None. FINDINGS: Frontal and lateral views were obtained. 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 No fracture or dislocation. No erosive changes. There is a spur along the anterior patella. There is soft tissue air in the joint. There are overlying skin staples. There is a sclerotic lesion in the proximal left tibia measuring 4.9 x 3.6 cm. 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 No fracture or dislocation. Acute postoperative changes noted. 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 Electronically Signed   By: Lowella Grip III M.D.   On: 01/16/2021 12:37   DG Abd 1 View  Result Date: 01/30/2021 CLINICAL DATA:  NG tube placement. EXAM: ABDOMEN - 1 VIEW COMPARISON:  None. FINDINGS: NG tube is in place with the tip in the fundus of the stomach. IMPRESSION: NG tube projects in good position. Electronically Signed   By: Inge Rise M.D.   On: 01/30/2021 13:35   DG Abd 1 View  Result Date: 01/21/2021 CLINICAL DATA:  Ileus  EXAM: ABDOMEN - 1 VIEW COMPARISON:  01/20/2021 FINDINGS: The patient was reportedly not able to be rolled into the left lateral decubitus position for a decubitus projection. A nasogastric tube is coiled in the left upper quadrant, presumably in a relatively collapsed stomach. Gas-filled loops of small and large bowel observed, with small bowel loops measuring up to 4.8 cm on today's examination. This compares to up to 5.5 cm on the 01/20/2021 exam. The visible gas-filled small bowel loops are primarily in the left abdomen. Lumbar spondylosis noted. IMPRESSION: 1. Gaseous prominence of large bowel, with small bowel loops currently dilated up to 4.8 cm (previously 5.5 cm yesterday). The appearance is abnormal but nonspecific and certainly may reflect the patient's clinical diagnosis of ileus. A nasogastric tube is coiled in the stomach. Electronically Signed   By: Van Clines M.D.   On: 01/21/2021 13:55   DG Abd 1 View  Result Date: 01/20/2021 CLINICAL DATA:  NG tube placement EXAM: ABDOMEN - 1 VIEW COMPARISON:  01/20/2021 12:48 p.m. FINDINGS: Interval placement of esophagogastric tube, tip and side port below the diaphragm. The included small bowel and colon remain diffusely gas distended, largest loops included colon measuring up to 8.9 cm. IMPRESSION: 1. Interval placement of esophagogastric tube, tip and side port below the diaphragm. 2. The included small bowel and colon remain diffusely gas distended, largest loops included colon measuring up to 8.9 cm. Electronically Signed   By: Eddie Candle M.D.   On: 01/20/2021 14:21   DG Abd 1 View  Result Date: 01/19/2021 CLINICAL DATA:  Abdominal pain and constipation EXAM: ABDOMEN - 1 VIEW COMPARISON:  CT abdomen and pelvis March 05, 2020 FINDINGS: There is generalized bowel dilatation without appreciable air-fluid levels. No free air appreciable. Tiny pelvic calcifications likely represent phleboliths. IMPRESSION: Bowel dilatation without air-fluid  levels. No free air evident on supine examination. Bowel-gas pattern is suggestive of ileus. Bowel obstruction felt to be less likely although not excluded.  Electronically Signed   By: Lowella Grip III M.D.   On: 01/19/2021 19:49   CT HEAD WO CONTRAST  Result Date: 01/26/2021 CLINICAL DATA:  Nonspecific dizziness.  Vision changes for 5 days. EXAM: CT HEAD WITHOUT CONTRAST TECHNIQUE: Contiguous axial images were obtained from the base of the skull through the vertex without intravenous contrast. COMPARISON:  None. FINDINGS: Brain: Dental artifact causes streak through the skull base. No intracranial hemorrhage, mass effect, or midline shift. No hydrocephalus. The basilar cisterns are patent. No evidence of territorial infarct or acute ischemia. No extra-axial or intracranial fluid collection. Vascular: Atherosclerosis of skullbase vasculature without hyperdense vessel or abnormal calcification. Skull: No fracture or focal lesion. Sinuses/Orbits: Nasogastric tube in place.  No acute findings. Other: None. IMPRESSION: No acute intracranial abnormality. Electronically Signed   By: Keith Rake M.D.   On: 01/26/2021 19:20   NM Pulmonary Perfusion  Result Date: 01/21/2021 CLINICAL DATA:  Post knee surgery. Shortness of breath. Low O2 sats. EXAM: NUCLEAR MEDICINE PERFUSION LUNG SCAN TECHNIQUE: Perfusion images were obtained in multiple projections after intravenous injection of radiopharmaceutical. Ventilation scans intentionally deferred if perfusion scan and chest x-ray adequate for interpretation during COVID 19 epidemic. RADIOPHARMACEUTICALS:  4.12 mCi Tc-74mMAA IV COMPARISON:  Chest x-ray 01/20/2021 FINDINGS: There are no segmental or subsegmental perfusion defects to suggest pulmonary embolus. Minimal patchy nonsegmental defects. IMPRESSION: No evidence of pulmonary embolus. Electronically Signed   By: KRolm BaptiseM.D.   On: 01/21/2021 10:53   [place]  Result Date: 01/20/2021 CLINICAL DATA:   Acute kidney injury EXAM: RENAL / URINARY TRACT ULTRASOUND COMPLETE COMPARISON:  03/05/2020 FINDINGS: Right Kidney: Renal measurements: 12.9 x 5.5 x 4.7 cm = volume: 175 ML. Upper pole cyst measures 4.6 x 4.5 x 3.4 cm. There is no hydronephrosis or mass. Normal parenchymal echogenicity. Left Kidney: Renal measurements: 12.4 by 5.4 x 3.2 cm = volume: 112.2 mL. Inferior pole cyst measures 4.3 x 4.2 x 3.7 cm. There is no mass or hydronephrosis. Normal parenchymal echogenicity. Bladder: Bladder appears collapsed. Other: None. IMPRESSION: 1. No acute findings.  No signs of obstructive uropathy. 2. Bilateral kidney cysts. Electronically Signed   By: TKerby MoorsM.D.   On: 01/20/2021 09:19   DG Chest Port 1 View  Result Date: 02/04/2021 CLINICAL DATA:  Shortness of breath EXAM: PORTABLE CHEST 1 VIEW COMPARISON:  01/27/2021 FINDINGS: Interval removal of esophagogastric tube. Otherwise unchanged examination with elevation of the left hemidiaphragm. No acute appearing airspace opacity. Right upper extremity PICC. IMPRESSION: 1.  Interval removal of esophagogastric tube. 2. Otherwise unchanged examination with elevation of the left hemidiaphragm. No acute appearing airspace opacity. Electronically Signed   By: AEddie CandleM.D.   On: 02/04/2021 19:45   DG CHEST PORT 1 VIEW  Result Date: 01/27/2021 CLINICAL DATA:  Shortness of breath. EXAM: PORTABLE CHEST 1 VIEW.  AP portable semi erect. COMPARISON:  Chest x-ray 01/24/2021, CT chest 01/26/2021 FINDINGS: Right PICC with tip overlying the right atrium. Enteric tube coursing below the hemidiaphragm with tip and side port collimated off view. The tube is noted to loop overlying the gastric lumen. The heart size and mediastinal contours are unchanged. Calcifications of the aorta. Left base streaky airspace opacity likely representing atelectasis. No pulmonary edema. Interval improvement/resolution of a non existing versus trace left pleural effusion. No right pleural  effusion. No pneumothorax. No acute osseous abnormality. IMPRESSION: 1. Streaky airspace opacity at the left base with elevated left hemidiaphragm likely representing atelectasis. 2. Improved/possible complete resolution  of a left pleural effusion. 3. Enteric tube courses below the hemidiaphragm with tip and side port collimated off view. Enteric tube is noted to be coiled overlying the gastric lumen. Consider retracting by 5 cm. Electronically Signed   By: Iven Finn M.D.   On: 01/27/2021 06:52   DG Chest Port 1 View  Result Date: 01/24/2021 CLINICAL DATA:  Aspiration pneumonia. EXAM: PORTABLE CHEST 1 VIEW COMPARISON:  01/20/2021. FINDINGS: NG tube and left IJ line in stable position. Mediastinal prominence, this may be related to AP portable technique. Follow-up PA and lateral chest x-ray suggested. Stable cardiomegaly. Low lung volumes. Low lung volumes with left base atelectasis/infiltrate again noted. Mild left perihilar infiltrate/edema. Small left pleural effusion can not be excluded. No pneumothorax. IMPRESSION: 1.  NG tube left IJ line stable position. 2. Mediastinal prominence, this may be related to AP portable technique. Follow-up PA and lateral chest x-ray suggested. 3.  Stable cardiomegaly. 4. Low lung volumes with left base atelectasis/infiltrate again noted. Mild left perihilar infiltrates/edema. Small left pleural effusion cannot be excluded. Electronically Signed   By: Marcello Moores  Register   On: 01/24/2021 05:52   DG Abd 2 Views  Result Date: 01/26/2021 CLINICAL DATA:  Abdominal distension. EXAM: ABDOMEN - 2 VIEW COMPARISON:  01/25/2021 FINDINGS: An NG tube is again noted with tip overlying the mid stomach. Gaseous distension of colon and small bowel are not significantly changed. Oral contrast administered on 01/24/2021 appears to lie within the cecum. IMPRESSION: Unchanged gaseous distension of colon and small bowel. Oral contrast administered 2 days ago lies within the cecum.  Electronically Signed   By: Margarette Canada M.D.   On: 01/26/2021 10:41   DG Abd 2 Views  Result Date: 01/24/2021 CLINICAL DATA:  Ileus EXAM: ABDOMEN - 2 VIEW COMPARISON:  01/21/2021 FINDINGS: Nasogastric tube is seen looped within the gastric lumen with its tip in the expected gastric antrum. Multiple gas-filled dilated loops of small bowel are again identified throughout the abdomen with a relative paucity of gas within the colon in keeping with a distal small bowel obstruction. No free intraperitoneal gas. Inferior pelvis excluded from view. No organomegaly. IMPRESSION: Nasogastric tube tip within the a gastric antrum. Persistent small bowel obstruction. Electronically Signed   By: Fidela Salisbury MD   On: 01/24/2021 05:52   DG ABD ACUTE 2+V W 1V CHEST  Result Date: 01/20/2021 CLINICAL DATA:  Decreased oxygen saturations and increased abdominal distention. EXAM: DG ABDOMEN ACUTE WITH 1 VIEW CHEST COMPARISON:  01/19/2021 FINDINGS: There is significant gaseous distension of large and small bowel loops. Small bowel loop in the LEFT UPPER QUADRANT is 5.5 centimeters. No free intraperitoneal air beneath the diaphragm. Heart size is normal. Low lung volumes. Patchy parenchymal opacities are identified throughout the lungs bilaterally, particularly involving the apices and LEFT LOWER lobe. IMPRESSION: Findings consistent with ileus or partial small bowel obstruction. Multifocal infiltrates in the lungs. Electronically Signed   By: Nolon Nations M.D.   On: 01/20/2021 14:27   DG Abd Portable 1V  Result Date: 01/29/2021 CLINICAL DATA:  Abdominal distention. EXAM: PORTABLE ABDOMEN - 1 VIEW COMPARISON:  01/27/2021. FINDINGS: Interim removal of NG tube. Persistent dilated loops of small bowel noted. The colon is slightly distended. Findings again suggest adynamic ileus. Partial small bowel obstruction again cannot be excluded. Continued follow-up exams suggested to demonstrate resolution of bowel distention. No free  air. Bibasilar atelectasis. Degenerative changes lumbar spine and both hips. Pelvic calcifications consistent phleboliths. IMPRESSION: 1.  Interim removal of  NG tube. 2. Persistent dilated loops of small bowel noted. The colon is slightly distended. Findings again suggest adynamic ileus. Partial small bowel obstruction again cannot be excluded. Continued follow-up exam suggested to demonstrate resolution of bowel distention. Electronically Signed   By: Marcello Moores  Register   On: 01/29/2021 06:40   DG Abd Portable 1V-Small Bowel Obstruction Protocol-24 hr delay  Result Date: 01/25/2021 CLINICAL DATA:  A 24 hour delay film for small-bowel obstruction. EXAM: PORTABLE ABDOMEN - 1 VIEW COMPARISON:  Film from the previous day. FINDINGS: Gastric catheter is again noted coiled within the stomach. Persistent small bowel dilatation is noted although colonic gas is again seen. By history contrast was administered 24 hours previous and contrast is noted within the distal small bowel predominately within the right lower quadrant. No significant colonic contrast is noted. No free air is seen. IMPRESSION: Persistent and stable small bowel dilatation when compared with the prior exam. Previously administered contrast material lies in the distal aspect of the small bowel. Electronically Signed   By: Inez Catalina M.D.   On: 01/25/2021 15:19   DG Abd Portable 1V-Small Bowel Obstruction Protocol-initial, 8 hr delay  Result Date: 01/24/2021 CLINICAL DATA:  Small bowel obstruction. EXAM: PORTABLE ABDOMEN - 1 VIEW COMPARISON:  01/24/2021, earlier same day FINDINGS: Diffuse gaseous bowel distension persists without substantial interval change. NG tube again noted in the stomach. IMPRESSION: No substantial interval change. Electronically Signed   By: Misty Stanley M.D.   On: 01/24/2021 14:00   DG Abd Portable 2V  Result Date: 01/27/2021 CLINICAL DATA:  Abdominal distension EXAM: PORTABLE ABDOMEN - 2 VIEW COMPARISON:  Jan 26, 2021  FINDINGS: The NG tube terminates in the stomach. Air-filled loops of large and small bowel are identified. No free air, portal venous gas, or pneumatosis. IMPRESSION: Dilated air-filled loops of small bowel are again identified, with decreased distension in the interval. Air throughout the colon again identified. Findings suggest ileus versus partial small-bowel obstruction. Recommend attention on follow-up. Electronically Signed   By: Dorise Bullion III M.D   On: 01/27/2021 11:55   ECHOCARDIOGRAM COMPLETE  Result Date: 01/21/2021    ECHOCARDIOGRAM REPORT   Patient Name:   DARREL GLOSS Date of Exam: 01/21/2021 Medical Rec #:  [id number]    Height:       68.0 in Accession #:    [id number]   Weight:       230.2 lb Date of Birth:  May 26, 1946   BSA:          2.169 m Patient Age:    30 years     BP:           114/52 mmHg Patient Gender: M            HR:           117 bpm. Exam Location:  Inpatient Procedure: 2D Echo, Cardiac Doppler, Color Doppler and Intracardiac            Opacification Agent Indications:    I48.91* Unspeicified atrial fibrillation  History:        Patient has prior history of Echocardiogram examinations, most                 recent 09/23/2016. CAD, Abnormal ECG, Pulmonary HTN,                 Arrythmias:Atrial Fibrillation; Risk Factors:Hypertension,                 Diabetes and Dyslipidemia. Lymphoma.  Sonographer:    Roseanna Rainbow RDCS Referring Phys: [id number] Oletha Blend [facility]  Sonographer Comments: Technically difficult study due to poor echo windows and patient is morbidly obese. Image acquisition challenging due to patient body habitus. Supine. IMPRESSIONS  1. Poor acoustic windows limit study even with Definity. Overall LVEF appears normal with no signficant wall motion abnormalities . Left ventricular ejection fraction, by estimation, is 50 to 55%. The left ventricle has low normal function. There is mild left ventricular hypertrophy.  2. Right ventricular systolic function is moderately reduced.  The right ventricular size is mildly enlarged. There is moderately elevated pulmonary artery systolic pressure.  3. Trivial mitral valve regurgitation.  4. The aortic valve was not well visualized. Aortic valve regurgitation is not visualized. Mild to moderate aortic valve sclerosis/calcification is present, without any evidence of aortic stenosis.  5. The inferior vena cava is dilated in size with <50% respiratory variability, suggesting right atrial pressure of 15 mmHg. Comparison(s): The left ventricular function is unchanged. The right ventricular systolic function mildly worse. FINDINGS  Left Ventricle: Poor acoustic windows limit study even with Definity. Overall LVEF appears normal with no signficant wall motion abnormalities. Left ventricular ejection fraction, by estimation, is 50 to 55%. The left ventricle has low normal function. Definity contrast agent was given IV to delineate the left ventricular endocardial borders. The left ventricular internal cavity size was normal in size. There is mild left ventricular hypertrophy. Right Ventricle: The right ventricular size is mildly enlarged. Right ventricular systolic function is moderately reduced. There is moderately elevated pulmonary artery systolic pressure. The tricuspid regurgitant velocity is 3.27 m/s, and with an assumed right atrial pressure of 15 mmHg, the estimated right ventricular systolic pressure is 09.4 mmHg. Left Atrium: Left atrial size was normal in size. Right Atrium: Right atrial size was normal in size. Pericardium: Trivial pericardial effusion is present. Mitral Valve: There is mild thickening of the mitral valve leaflet(s). Mild mitral annular calcification. Trivial mitral valve regurgitation. Tricuspid Valve: The tricuspid valve is not well visualized. Tricuspid valve regurgitation is trivial. Aortic Valve: The aortic valve was not well visualized. Aortic valve regurgitation is not visualized. Mild to moderate aortic valve  sclerosis/calcification is present, without any evidence of aortic stenosis. Aortic valve mean gradient measures 7.0 mmHg.  Aortic valve peak gradient measures 12.2 mmHg. Aortic valve area, by VTI measures 1.32 cm. Pulmonic Valve: The pulmonic valve was not well visualized. Pulmonic valve regurgitation is not visualized. No evidence of pulmonic stenosis. Aorta: The aortic root is normal in size and structure. Venous: The inferior vena cava is dilated in size with less than 50% respiratory variability, suggesting right atrial pressure of 15 mmHg. IAS/Shunts: No atrial level shunt detected by color flow Doppler.  LEFT VENTRICLE PLAX 2D LVIDd:         3.80 cm LVIDs:         2.70 cm LV PW:         1.40 cm LV IVS:        1.10 cm LVOT diam:     1.70 cm LV SV:         32 LV SV Index:   15 LVOT Area:     2.27 cm  IVC IVC diam: 3.10 cm LEFT ATRIUM             Index       RIGHT ATRIUM           Index LA diam:  3.90 cm 1.80 cm/m  RA Area:     20.60 cm LA Vol (A2C):   26.7 ml 12.31 ml/m RA Volume:   62.50 ml  28.81 ml/m LA Vol (A4C):   35.3 ml 16.27 ml/m LA Biplane Vol: 31.6 ml 14.57 ml/m  AORTIC VALVE AV Area (Vmax):    1.39 cm AV Area (Vmean):   1.26 cm AV Area (VTI):     1.32 cm AV Vmax:           175.00 cm/s AV Vmean:          120.400 cm/s AV VTI:            0.244 m AV Peak Grad:      12.2 mmHg AV Mean Grad:      7.0 mmHg LVOT Vmax:         107.00 cm/s LVOT Vmean:        66.600 cm/s LVOT VTI:          0.142 m LVOT/AV VTI ratio: 0.58  AORTA Ao Root diam: 3.10 cm Ao Asc diam:  3.50 cm MITRAL VALVE                TRICUSPID VALVE MV Area (PHT): 4.06 cm     TR Peak grad:   42.8 mmHg MV Decel Time: 187 msec     TR Vmax:        327.00 cm/s MV E velocity: 112.33 cm/s                             SHUNTS                             Systemic VTI:  0.14 m                             Systemic Diam: 1.70 cm Dorris Carnes MD Electronically signed by Dorris Carnes MD Signature Date/Time: 01/21/2021/8:19:54 PM    Final    VAS [place]  LOWER EXTREMITY VENOUS (DVT)  Result Date: 01/21/2021  Lower Venous DVT Study Patient Name:  BRONISLAW SWITZER  Date of Exam:   01/21/2021 Medical Rec #: [id number]     Accession #:    [id number] Date of Birth: 1946-03-02    Patient Gender: M Patient Age:   74Y Exam Location:  [facility] Procedure:      VAS [place] LOWER EXTREMITY VENOUS (DVT) Referring Phys: [id number] Candee Furbish --------------------------------------------------------------------------------  Indications: Edema.  Limitations: Poor ultrasound/tissue interface. Comparison Study: No prior studies. Performing Technologist: Oliver Hum RVT  Examination Guidelines: A complete evaluation includes B-mode imaging, spectral Doppler, color Doppler, and power Doppler as needed of all accessible portions of each vessel. Bilateral testing is considered an integral part of a complete examination. Limited examinations for reoccurring indications may be performed as noted. The reflux portion of the exam is performed with the patient in reverse Trendelenburg.  +---------+---------------+---------+-----------+----------+--------------+ RIGHT    CompressibilityPhasicitySpontaneityPropertiesThrombus Aging +---------+---------------+---------+-----------+----------+--------------+ CFV      Full           Yes      Yes                                 +---------+---------------+---------+-----------+----------+--------------+ SFJ      Full                                                        +---------+---------------+---------+-----------+----------+--------------+  FV Prox  Full                                                        +---------+---------------+---------+-----------+----------+--------------+ FV Mid   Full                                                        +---------+---------------+---------+-----------+----------+--------------+ FV DistalFull                                                         +---------+---------------+---------+-----------+----------+--------------+ PFV      Full                                                        +---------+---------------+---------+-----------+----------+--------------+ POP      Full           Yes      Yes                                 +---------+---------------+---------+-----------+----------+--------------+ PTV      Full                                                        +---------+---------------+---------+-----------+----------+--------------+ PERO     Full                                                        +---------+---------------+---------+-----------+----------+--------------+   +---------+---------------+---------+-----------+----------+-------------------+ LEFT     CompressibilityPhasicitySpontaneityPropertiesThrombus Aging      +---------+---------------+---------+-----------+----------+-------------------+ CFV      Full           Yes      Yes                                      +---------+---------------+---------+-----------+----------+-------------------+ SFJ      Full                                                             +---------+---------------+---------+-----------+----------+-------------------+ FV Prox  Full                                                             +---------+---------------+---------+-----------+----------+-------------------+  FV Mid   Full                                                             +---------+---------------+---------+-----------+----------+-------------------+ FV DistalFull                                                             +---------+---------------+---------+-----------+----------+-------------------+ PFV      Full                                                             +---------+---------------+---------+-----------+----------+-------------------+ POP      Full           Yes      Yes                                       +---------+---------------+---------+-----------+----------+-------------------+ PTV      Full                                                             +---------+---------------+---------+-----------+----------+-------------------+ PERO                                                  Not well visualized +---------+---------------+---------+-----------+----------+-------------------+     Summary: RIGHT: - There is no evidence of deep vein thrombosis in the lower extremity. However, portions of this examination were limited- see technologist comments above.  - No cystic structure found in the popliteal fossa.  LEFT: - There is no evidence of deep vein thrombosis in the lower extremity. However, portions of this examination were limited- see technologist comments above.  - No cystic structure found in the popliteal fossa.  *See table(s) above for measurements and observations. Electronically signed by Monica Martinez MD on 01/21/2021 at 4:57:23 PM.    Final    [place] EKG SITE RITE  Result Date: 01/23/2021 If Site Rite image not attached, placement could not be confirmed due to current cardiac rhythm.  CT CHEST ABDOMEN PELVIS WO CONTRAST  Result Date: 01/26/2021 CLINICAL DATA:  Abdominal distension.  Anemia. EXAM: CT CHEST, ABDOMEN AND PELVIS WITHOUT CONTRAST TECHNIQUE: Multidetector CT imaging of the chest, abdomen and pelvis was performed following the standard protocol without IV contrast. COMPARISON:  Recent chest and abdominal radiographs. Abdominopelvic CT 01/23/2021 FINDINGS: CT CHEST FINDINGS Cardiovascular: Right upper extremity PICC tip at the atrial caval junction. Aortic atherosclerosis and tortuosity without aneurysm. There is dilatation of the main pulmonary artery at 4.3 cm. Mild cardiomegaly. Coronary artery calcifications.  No pericardial effusion. Mediastinum/Nodes: Small mediastinal lymph nodes are not enlarged by size criteria. Limited  assessment for hilar adenopathy on this noncontrast exam. Nasogastric tube in the esophagus, which is mildly patulous despite decompression. No suspicious thyroid nodule. Lungs/Pleura: Small bilateral pleural effusions. Associated basilar atelectasis. Additional compressive atelectasis in the left lower lobe related to elevated hemidiaphragm. Motion artifact limits assessment of pulmonary parenchyma. Linear platelike atelectasis in the lingula. No septal thickening or findings of pulmonary edema. No pneumothorax. Musculoskeletal: Diffuse degenerative change throughout the thoracic spine. No acute osseous abnormalities are seen. No chest wall soft tissue abnormality. CT ABDOMEN PELVIS FINDINGS Hepatobiliary: Motion artifact through the liver and gallbladder limiting assessment. No focal hepatic abnormality allowing for motion and lack of IV contrast. Liver is prominent size spanning 20 cm cranial caudal. No calcified gallstones. Pancreas: Motion artifact through the pancreas. No obvious ductal dilatation or inflammation. Spleen: Upper normal in size spanning 13.1 cm. No focal abnormality allowing for motion and lack of IV contrast. Splenule inferiorly. Adrenals/Urinary Tract: No adrenal nodule. Motion artifact through the kidneys limits detailed assessment. There is no hydronephrosis. Bilateral renal cysts. More detailed assessment is limited. The urinary bladder is minimally distended. Mild bladder wall thickening. Stomach/Bowel: Enteric tube tip in the stomach. Stomach is decompressed. Small bowel loops are diffusely dilated measuring up to 3.9 cm, fluid and contrast filled. No obstruction, as enteric contrast reaches the colon. No evidence of small bowel inflammation. Appendix is not well seen on this current exam due to motion. Administered enteric contrast reaches the sigmoid colon. Sigmoid colonic diverticulosis without diverticulitis. Sigmoid colonic redundancy. Small volume of colonic stool. No evidence of  colonic inflammation. Vascular/Lymphatic: Moderate aortic atherosclerosis. No aortic aneurysm. Motion limits assessment for adenopathy, no obvious enlarged lymph nodes in the abdomen or pelvis. Reproductive: Enlarged prostate gland spanning 5.4 cm. Other: Trace free fluid in the pelvis. Perihepatic ascites in prior exam has resolved. Generalized subcutaneous and flank soft tissue edema. No free air or focal fluid collection. Musculoskeletal: Degenerative change throughout the lumbar spine and both hips. There are no acute or suspicious osseous abnormalities. IMPRESSION: 1. Small bilateral pleural effusions with adjacent basilar atelectasis. Additional compressive atelectasis in the left lower lobe related to elevated hemidiaphragm. 2. Diffusely dilated fluid and contrast filled small bowel loops, suggesting generalized ileus. No obstruction, as enteric contrast reaches the colon. 3. Colonic diverticulosis without diverticulitis. 4. Enlarged prostate gland. Mild bladder wall thickening may be due to chronic bladder outlet obstruction. 5. Enlarged main pulmonary artery suggesting pulmonary arterial hypertension. 6. Aortic atherosclerosis and coronary artery calcifications. 7. Generalized body wall edema. Trace free fluid/ascites in the pelvis. Aortic Atherosclerosis (ICD10-I70.0). Electronically Signed   By: Keith Rake M.D.   On: 01/26/2021 19:46      Procedures: left knee replacement   Subjective: Patient is feeling better, no nausea or vomiting, no dyspnea or chest pain. He has been out of bed.   Discharge Exam: Vitals:   02/05/21 2044 02/06/21 0358  BP: (!) 103/50 110/62  Pulse: 74 70  Resp: (!) 21 (!) 22  Temp:  98 F (36.7 C)  SpO2: 98% 97%   Vitals:   02/05/21 1423 02/05/21 2024 02/05/21 2044 02/06/21 0358  BP: 121/65 (!) 99/54 (!) 103/50 110/62  Pulse: 76 77 74 70  Resp: 18 20 (!) 21 (!) 22  Temp: 98.5 F (36.9 C) 98.6 F (37 C)  98 F (36.7 C)  TempSrc: Oral Oral  Oral   SpO2: 99% 95% 98% 97%  Weight:      Height:        General: Not in pain or dyspnea.  Neurology: Awake and alert, non focal  E ENT: mild pallor, no icterus, oral mucosa moist Cardiovascular: No JVD. S1-S2 present, rhythmic, no gallops, rubs, or murmurs. No lower extremity edema. Pulmonary: positive breath sounds bilaterally, adequate air movement, no wheezing, rhonchi or rales. Gastrointestinal. Abdomen soft and non tender Skin. No rashes Musculoskeletal: no joint deformities   The results of significant diagnostics from this hospitalization (including imaging, microbiology, ancillary and laboratory) are listed below for reference.     Microbiology: Recent Results (from the past 240 hour(s))  SARS CORONAVIRUS 2 (TAT 6-24 HRS) Nasopharyngeal Nasopharyngeal Swab     Status: None   Collection Time: 02/05/21  1:52 PM   Specimen: Nasopharyngeal Swab  Result Value Ref Range Status   SARS Coronavirus 2 NEGATIVE NEGATIVE Final    Comment: (NOTE) SARS-CoV-2 target nucleic acids are NOT DETECTED.  The SARS-CoV-2 RNA is generally detectable in upper and lower respiratory specimens during the acute phase of infection. Negative results do not preclude SARS-CoV-2 infection, do not rule out co-infections with other pathogens, and should not be used as the sole basis for treatment or other patient management decisions. Negative results must be combined with clinical observations, patient history, and epidemiological information. The expected result is Negative.  Fact Sheet for Patients: SugarRoll.be  Fact Sheet for Healthcare Providers: https://www.woods-mathews.com/  This test is not yet approved or cleared by the [place] FDA and  has been authorized for detection and/or diagnosis of SARS-CoV-2 by FDA under an Emergency Use Authorization (EUA). This EUA will remain  in effect (meaning this test can be used) for the duration of  the COVID-19 declaration under Se ction 564(b)(1) of the Act, 21 U.S.C. section 360bbb-3(b)(1), unless the authorization is terminated or revoked sooner.  Performed at [facility], [place] [address], [place],  [address]      Labs: BNP (last 3 results) Recent Labs    01/20/21 1221 01/29/21 0402  BNP 34.6 64.6   Basic Metabolic Panel: Recent Labs  Lab 01/31/21 0242 02/01/21 0259 02/02/21 0359 02/03/21 0608 02/04/21 0340 02/05/21 0407 02/06/21 0355  NA 144 145 142 138 137 136 135  K 3.7 4.0 3.6 4.4 4.1 4.3 4.2  CL 113* 113* 104 102 100 99 100  CO2 _0 GLUCOSE 118* 140* 132* 133* 120* 114* 146*  BUN 98* 87* 69* 60* 61* 59* 59*  CREATININE 1.81* 1.71* 1.51* 1.56* 1.69* 1.81* 1.77*  CALCIUM 8.3* 8.5* 8.6* 8.7* 8.8* 8.6* 8.6*  MG 2.3 2.2 2.0 2.1 2.1  --   --   PHOS 3.9 3.8 4.0 4.5 5.3*  --   --    Liver Function Tests: Recent Labs  Lab 01/31/21 0242 02/02/21 0359 02/04/21 0340  AST _1 ALT 72* 55* 49*  ALKPHOS 55 66 86  BILITOT 1.2 0.9 0.8  PROT 5.0* 5.5* 5.9*  ALBUMIN 2.0* 2.2* 2.4*   No results for input(s): LIPASE, AMYLASE in the last 168 hours. Recent Labs  Lab 01/30/21 1106 01/31/21 0510  AMMONIA 71* 21   CBC: Recent Labs  Lab 01/31/21 0511 02/01/21 0259 02/02/21 0359 02/04/21 0340 02/05/21 0935  WBC 10.0 8.0 7.4 9.1  --   NEUTROABS  --   --   --  6.2  --   HGB 8.5* 8.0* 8.1* 8.1* 9.1*  HCT 26.4* 25.5*  25.9* 26.2* 30.1*  MCV 94.3 94.1 94.9 94.9  --   PLT 202 231 263 311  --    Cardiac Enzymes: No results for input(s): CKTOTAL, CKMB, CKMBINDEX, TROPONINI in the last 168 hours. BNP: Invalid input(s): POCBNP CBG: Recent Labs  Lab 02/05/21 1635 02/05/21 2033 02/05/21 2359 02/06/21 0357 02/06/21 0734  GLUCAP 119* 152* 115* 152* 86   D-Dimer No results for input(s): DDIMER in the last 72 hours. Hgb A1c No results for input(s): HGBA1C in the last 72 hours. Lipid Profile Recent Labs     02/04/21 0320  TRIG 77   Thyroid function studies No results for input(s): TSH, T4TOTAL, T3FREE, THYROIDAB in the last 72 hours.  Invalid input(s): FREET3 Anemia work up No results for input(s): VITAMINB12, FOLATE, FERRITIN, TIBC, IRON, RETICCTPCT in the last 72 hours. Urinalysis    Component Value Date/Time   COLORURINE AMBER (A) 01/19/2021 1622   APPEARANCEUR CLOUDY (A) 01/19/2021 1622   LABSPEC 1.018 01/19/2021 1622   PHURINE 5.0 01/19/2021 1622   GLUCOSEU NEGATIVE 01/19/2021 1622   HGBUR MODERATE (A) 01/19/2021 1622   BILIRUBINUR NEGATIVE 01/19/2021 1622   KETONESUR NEGATIVE 01/19/2021 1622   PROTEINUR 100 (A) 01/19/2021 1622   NITRITE NEGATIVE 01/19/2021 1622   LEUKOCYTESUR NEGATIVE 01/19/2021 1622   Sepsis Labs Invalid input(s): PROCALCITONIN,  WBC,  LACTICIDVEN Microbiology Recent Results (from the past 240 hour(s))  SARS CORONAVIRUS 2 (TAT 6-24 HRS) Nasopharyngeal Nasopharyngeal Swab     Status: None   Collection Time: 02/05/21  1:52 PM   Specimen: Nasopharyngeal Swab  Result Value Ref Range Status   SARS Coronavirus 2 NEGATIVE NEGATIVE Final    Comment: (NOTE) SARS-CoV-2 target nucleic acids are NOT DETECTED.  The SARS-CoV-2 RNA is generally detectable in upper and lower respiratory specimens during the acute phase of infection. Negative results do not preclude SARS-CoV-2 infection, do not rule out co-infections with other pathogens, and should not be used as the sole basis for treatment or other patient management decisions. Negative results must be combined with clinical observations, patient history, and epidemiological information. The expected result is Negative.  Fact Sheet for Patients: SugarRoll.be  Fact Sheet for Healthcare Providers: https://www.woods-mathews.com/  This test is not yet approved or cleared by the [place] FDA and  has been authorized for detection and/or diagnosis of SARS-CoV-2 by FDA  under an Emergency Use Authorization (EUA). This EUA will remain  in effect (meaning this test can be used) for the duration of the COVID-19 declaration under Se ction 564(b)(1) of the Act, 21 U.S.C. section 360bbb-3(b)(1), unless the authorization is terminated or revoked sooner.  Performed at [facility], [place] [address] [place], [place] [address]      Time coordinating discharge: 45 minutes  SIGNED:   Tawni Millers, MD  [facility] 02/06/2021, 9:21 AM

## 2021-02-11 ENCOUNTER — Other Ambulatory Visit (HOSPITAL_COMMUNITY): Payer: Self-pay

## 2021-02-13 ENCOUNTER — Other Ambulatory Visit (HOSPITAL_COMMUNITY): Payer: Self-pay

## 2021-02-15 ENCOUNTER — Other Ambulatory Visit (HOSPITAL_COMMUNITY): Payer: Self-pay

## 2021-02-18 ENCOUNTER — Ambulatory Visit (INDEPENDENT_AMBULATORY_CARE_PROVIDER_SITE_OTHER): Payer: Medicare Other | Admitting: Physician Assistant

## 2021-02-18 ENCOUNTER — Encounter: Payer: Self-pay | Admitting: Physician Assistant

## 2021-02-18 ENCOUNTER — Other Ambulatory Visit (INDEPENDENT_AMBULATORY_CARE_PROVIDER_SITE_OTHER): Payer: Medicare Other

## 2021-02-18 VITALS — BP 104/56 | HR 88 | Ht 68.0 in | Wt 207.1 lb

## 2021-02-18 DIAGNOSIS — Z8719 Personal history of other diseases of the digestive system: Secondary | ICD-10-CM | POA: Diagnosis not present

## 2021-02-18 DIAGNOSIS — K5900 Constipation, unspecified: Secondary | ICD-10-CM

## 2021-02-18 DIAGNOSIS — K209 Esophagitis, unspecified without bleeding: Secondary | ICD-10-CM

## 2021-02-18 DIAGNOSIS — D62 Acute posthemorrhagic anemia: Secondary | ICD-10-CM | POA: Diagnosis not present

## 2021-02-18 LAB — COMPREHENSIVE METABOLIC PANEL
ALT: 11 U/L (ref 0–53)
AST: 12 U/L (ref 0–37)
Albumin: 3.6 g/dL (ref 3.5–5.2)
Alkaline Phosphatase: 98 U/L (ref 39–117)
BUN: 28 mg/dL — ABNORMAL HIGH (ref 6–23)
CO2: 24 mEq/L (ref 19–32)
Calcium: 9.8 mg/dL (ref 8.4–10.5)
Chloride: 104 mEq/L (ref 96–112)
Creatinine, Ser: 1.63 mg/dL — ABNORMAL HIGH (ref 0.40–1.50)
GFR: 40.93 mL/min — ABNORMAL LOW (ref >60.00)
Glucose, Bld: 119 mg/dL — ABNORMAL HIGH (ref 70–99)
Potassium: 4.6 mEq/L (ref 3.5–5.1)
Sodium: 138 mEq/L (ref 135–145)
Total Bilirubin: 0.5 mg/dL (ref 0.2–1.2)
Total Protein: 7.2 g/dL (ref 6.0–8.3)

## 2021-02-18 LAB — CBC WITH DIFFERENTIAL/PLATELET
Basophils Absolute: 0 10*3/uL (ref 0.0–0.1)
Basophils Relative: 0.3 % (ref 0.0–3.0)
Eosinophils Absolute: 0.1 10*3/uL (ref 0.0–0.7)
Eosinophils Relative: 2.9 % (ref 0.0–5.0)
HCT: 31 % — ABNORMAL LOW (ref 39.0–52.0)
Hemoglobin: 10.2 g/dL — ABNORMAL LOW (ref 13.0–17.0)
Lymphocytes Relative: 22.4 % (ref 12.0–46.0)
Lymphs Abs: 1.1 10*3/uL (ref 0.7–4.0)
MCHC: 32.7 g/dL (ref 30.0–36.0)
MCV: 89.2 fl (ref 78.0–100.0)
Monocytes Absolute: 0.4 10*3/uL (ref 0.1–1.0)
Monocytes Relative: 7.4 % (ref 3.0–12.0)
Neutro Abs: 3.4 10*3/uL (ref 1.4–7.7)
Neutrophils Relative %: 67 % (ref 43.0–77.0)
Platelets: 350 10*3/uL (ref 150.0–400.0)
RBC: 3.48 Mil/uL — ABNORMAL LOW (ref 4.22–5.81)
RDW: 17.7 % — ABNORMAL HIGH (ref 11.5–15.5)
WBC: 5.1 10*3/uL (ref 4.0–10.5)

## 2021-02-18 LAB — IBC PANEL
Iron: 43 ug/dL (ref 42–165)
Saturation Ratios: 14.8 % — ABNORMAL LOW (ref 20.0–50.0)
Transferrin: 208 mg/dL — ABNORMAL LOW (ref 212.0–360.0)

## 2021-02-18 LAB — FERRITIN: Ferritin: 300.1 ng/mL (ref 22.0–322.0)

## 2021-02-18 MED ORDER — PANTOPRAZOLE SODIUM 40 MG PO TBEC
40.0000 mg | DELAYED_RELEASE_TABLET | Freq: Every morning | ORAL | 6 refills | Status: DC
Start: 2021-02-18 — End: 2021-10-23

## 2021-02-18 NOTE — Patient Instructions (Addendum)
If you are age 76 or older, your body mass index should be between 23-30. Your Body mass index is 31.49 kg/m. If this is out of the aforementioned range listed, please consider follow up with your Primary Care Provider. __________________________________________________________  The [facility] providers would like to encourage you to use [facility] to communicate with providers for non-urgent requests or questions.  Due to long hold times on the telephone, sending your provider a message by [facility] may be a faster and more efficient way to get a response.  Please allow 48 business hours for a response.  Please remember that this is for non-urgent requests.   Your provider has requested that you go to the basement level for lab work before leaving today. Press "B" on the elevator. The lab is located at the first door on the left as you exit the elevator.  Continue Pantoprazole 40 mg 1 tablet every morning  Use Miralax 1 capful in 8 ounces of water or juice daily.  Follow up pending the results of your labs.  Thank you for entrusting me with your care and choosing [facility].  Amy Esterwood, PA-C

## 2021-02-18 NOTE — Progress Notes (Signed)
Subjective:    Patient ID: [name], male    DOB: 1945/11/07, 75 y.o.   MRN: [id number]  HPI [name] is a pleasant 75 year old white male, established with Dr. Ardis Hughs who comes in today for post hospital follow-up after recent prolonged hospitalization. Patient has several comorbidities including hypertension, peripheral arterial disease, coronary artery disease, pulmonary hypertension, sleep apnea, IBS, and prior history of large B-cell lymphoma. Patient initially underwent left total knee replacement on 01/16/2021 and course was complicated by prolonged postoperative ileus, acute kidney injury, congestive heart failure and atrial fibrillation.  He also had some evidence of GI bleeding while anticoagulated, and underwent EGD on 01/31/2021 which showed some mild gastritis and Candida esophagitis. Ileus was prolonged, and he did require a course of TPN, eventually symptoms resolved. He also had an aspiration pneumonitis during hospitalization.  Due to generalized debilitation he had a rehab stay in [place] for about 2 weeks and has just returned home. He reports that he is having good bowel movements on a daily basis and has been started on MiraLAX 17 g in 8 ounces of water daily.  He has no complaints of abdominal pain.  He has completed the course of Diflucan for Candida esophagitis and continues on Protonix once daily.  He says his appetite is fair, he is trying to eat more he has no complaints of nausea or vomiting.  He has not noted any hematochezia.  He says his stools had remained dark after hospitalization but have returned to a brown color currently. During hospitalization he did require transfusions and his wife reports that he required another transfusion last week due to hemoglobin down to 7.2.  When he was discharged from the hospital hemoglobin was 9.1.  His wife is concerned about possibility of ongoing GI bleeding. He was discharged on Eliquis after the episode of atrial fibrillation  during hospitalization. He is no longer requiring oxygen continuously but is using 2 L at nighttime. He has follow-up with orthopedics next week.  Review of Systems. Pertinent positive and negative review of systems were noted in the above HPI section.  All other review of systems was otherwise negative.   Outpatient Encounter Medications as of 02/18/2021  Medication Sig   acetaminophen (TYLENOL) 325 MG tablet Take 2 tablets (650 mg total) by mouth every 6 (six) hours as needed for mild pain.   allopurinol (ZYLOPRIM) 100 MG tablet Take 100 mg by mouth at bedtime.   amiodarone (PACERONE) 200 MG tablet Take 1 tablet (200 mg total) by mouth daily.   apixaban (ELIQUIS) 5 MG TABS tablet Take 1 tablet (5 mg total) by mouth 2 (two) times daily.   Ascorbic Acid (VITAMIN C) 1000 MG tablet Take 1,000 mg by mouth in the morning.   atorvastatin (LIPITOR) 20 MG tablet Take 20 mg by mouth every evening.   B Complex-Biotin-FA (VITAMIN B50 COMPLEX PO) Take 1 capsule by mouth in the morning.   Calcium Carb-Cholecalciferol (CALCIUM 600+D) 600-800 MG-UNIT TABS Take 1 tablet by mouth in the morning.   Cholecalciferol (VITAMIN D3) 10 MCG (400 UNIT) tablet Take 400 Units by mouth in the morning.   diclofenac Sodium (VOLTAREN) 1 % GEL Apply 1 application topically 4 (four) times daily as needed (knee pain).   docusate sodium (COLACE) 100 MG capsule Take 1 capsule (100 mg total) by mouth 2 (two) times daily as needed for mild constipation.   furosemide (LASIX) 40 MG tablet Take 1 tablet (40 mg total) by mouth daily.   ipratropium-albuterol (DUONEB)  0.5-2.5 (3) MG/3ML SOLN Inhale 3 mLs into the lungs every 6 (six) hours as needed (illness (colds)/respiratory).   Menthol, Topical Analgesic, (BIOFREEZE EX) Apply 1 application topically 4 (four) times daily as needed (knee pain.).   metFORMIN (GLUCOPHAGE-XR) 500 MG 24 hr tablet Take 500 mg by mouth at bedtime.   metoprolol succinate (TOPROL-XL) 25 MG 24 hr tablet Take 1  tablet (25 mg total) by mouth 2 (two) times daily at 10 AM and 5 PM.   Multiple Vitamin (MULTIVITAMIN WITH MINERALS) TABS tablet Take 1 tablet by mouth in the morning. Centrum Silver   Nutritional Supplements (,FEEDING SUPPLEMENT, PROSOURCE PLUS) liquid Take 30 mLs by mouth 2 (two) times daily between meals.   oxybutynin (DITROPAN) 5 MG tablet Take 5 mg by mouth in the morning.   OXYGEN Inhale 2.5 L into the lungs at bedtime.   polyethylene glycol (MIRALAX / GLYCOLAX) 17 g packet Mix 17 g with 8 ounces of liquid and take by mouth daily as needed for mild constipation.   spironolactone (ALDACTONE) 25 MG tablet Take 1 tablet (25 mg total) by mouth at bedtime.   [DISCONTINUED] pantoprazole (PROTONIX) 40 MG tablet Take 1 tablet (40 mg total) by mouth daily. (Patient taking differently: Take 40 mg by mouth in the morning.)   pantoprazole (PROTONIX) 40 MG tablet Take 1 tablet (40 mg total) by mouth in the morning.   [DISCONTINUED] oxyCODONE (OXY IR/ROXICODONE) 5 MG immediate release tablet Take 1 tablet (5 mg total) by mouth every 4 (four) hours as needed for severe pain.   [DISCONTINUED] Testosterone Cypionate 200 MG/ML KIT Inject 200 mg into the muscle every 14 (fourteen) days.   No facility-administered encounter medications on file as of 02/18/2021.   Allergies  Allergen Reactions   Penicillins Hives and Other (See Comments)    "cillins" Has patient had a PCN reaction causing immediate rash, facial/tongue/throat swelling, SOB or lightheadedness with hypotension: unknown Has patient had a PCN reaction causing severe rash involving mucus membranes or skin necrosis: unknown Has patient had a PCN reaction that required hospitalization no Has patient had a PCN reaction occurring within the last 10 years:no If all of the above answers are "NO", then may proceed with Cephalosporin use.    Nsaids Other (See Comments)    Gastritis & Upper GI bleed   Patient Active Problem List   Diagnosis Date Noted    Ileus, postoperative (HCC)    Candida esophagitis (HCC)    Anasarca    SOB (shortness of breath)    Protein-calorie malnutrition, moderate ([place]) 01/26/2021   Gastrointestinal bleed 01/26/2021   PAF (paroxysmal atrial fibrillation) ([place]) 01/26/2021   Acute blood loss anemia 01/26/2021   History of gastritis 01/26/2021   History of adenomatous polyp of colon 01/26/2021   Diverticulosis of colon without hemorrhage 01/26/2021   Intermittent diarrhea 01/26/2021   Irritable bowel syndrome with intermittent diarrhea 01/26/2021   GERD (gastroesophageal reflux disease)    Ileus (HCC)    Acute respiratory distress 01/20/2021   AKI (acute kidney injury) ([place])    Acute renal failure ([place])    Benign prostatic hyperplasia    Obstructive sleep apnea treated with continuous positive airway pressure (CPAP)    Hypotension    S/P TKR (total knee replacement) using cement 01/16/2021   Pain in left knee 04/30/2020   Osteoarthritis of right knee 05/11/2018   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 ([place]) 04/27/2017   Pulmonary HTN ([place]) 10/30/2016   Hyperlipidemia 01/30/2016  Secondary hypertension, unspecified 01/30/2016   Type 2 diabetes mellitus without complication, without long-term current use of insulin ([place]) 01/30/2016   Hypogonadism, male 01/30/2016   Low testosterone 01/30/2016   Essential hypertension 01/30/2016   Obesity (BMI 30-39.9) 01/30/2016   PAD (peripheral artery disease) ([place]) 01/30/2016   CAD (coronary artery disease) 01/30/2016   History of antineoplastic chemotherapy 06/30/2012   History of radiation therapy 06/30/2012   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 ([place]) 06/30/2012   Social History   Socioeconomic History   Marital status: Married    Spouse name: Not on file   Number of children: Not on file   Years of education: Not on file   Highest education level: Not on file  Occupational  History   Not on file  Tobacco Use   Smoking status: Never   Smokeless tobacco: Never  Vaping Use   Vaping Use: Never used  Substance and Sexual Activity   Alcohol use: No   Drug use: No   Sexual activity: Not on file  Other Topics Concern   Not on file  Social History Narrative   Not on file   Social Determinants of Health   Financial Resource Strain: Not on file  Food Insecurity: Not on file  Transportation Needs: Not on file  Physical Activity: Not on file  Stress: Not on file  Social Connections: Not on file  Intimate Partner Violence: Not on file    Mr. Godeaux family history includes Heart attack in his father; Heart disease in his father.      Objective:    Vitals:   02/18/21 1342  BP: (!) 104/56  Pulse: 88    Physical Exam Well-developed well-nourished older white male in no acute distress.  Height, Weight, 207 BMI 31.4 accompanied by wife and son patient comes in in a wheelchair  HEENT; nontraumatic normocephalic, EOMI, PE R LA, sclera anicteric. Oropharynx; not examined today Neck; supple, no JVD Cardiovascular; regular rate and rhythm with S1-S2, no murmur rub or gallop Pulmonary; Clear bilaterally Abdomen; soft, obese, nondistended, nontender no palpable mass or hepatosplenomegaly, bowel sounds are active Rectal; not done today Skin; benign exam, no jaundice rash or appreciable lesions Extremities; no clubbing cyanosis or edema skin warm and dry Neuro/Psych; alert and oriented x4, grossly nonfocal mood and affect appropriate        Assessment & Plan:   #82 75 year old white male with prolonged hospital course status post left total knee replacement 01/16/2021.  Course complicated by prolonged ileus, aspiration pneumonitis, congestive heart failure, atrial fibrillation and acute kidney injury. Patient has completed an outpatient rehab stay and has just returned to home.  He is having normal bowel movements and has no complaints of abdominal  pain.  #2 Candida esophagitis-has completed course of Diflucan #3 gastritis continue once daily Protonix #4 anemia-multifactoral but felt to have component of GI bleeding during recent hospitalization with finding of gastritis and Candida esophagitis on EGD, negative CT save ileus Patient has required another unit of packed RBCs since discharge from the hospital Rule out persistent low-grade GI blood loss etiology not clear in setting of Eliquis  #5 anticoagulation secondary to atrial fibrillation-on Eliquis #6 coronary artery disease 7.  Sleep apnea 8.  Pulmonary hypertension 9.  Acute kidney injury resolved  Plan; continue Protonix 40 mg p.o. every morning Continue MiraLAX 17 g in 8 ounces of water daily if this causes stools to be very loose or diarrhea then decrease  to one half dose in 8 ounces of water daily Patient will complete Hemoccults CBC with differential and be met today If persistent drift in hemoglobin, and Hemoccults positive he may require discontinuation of Eliquis and further endoscopic evaluation with colonoscopy plus minus enteroscopy with Dr. Ardis Hughs.  Christapher Gillian Genia Harold PA-C 02/18/2021   Cc: Bing Neighbors, NP

## 2021-02-19 ENCOUNTER — Other Ambulatory Visit: Payer: Self-pay

## 2021-02-19 ENCOUNTER — Telehealth: Payer: Self-pay

## 2021-02-19 DIAGNOSIS — D62 Acute posthemorrhagic anemia: Secondary | ICD-10-CM

## 2021-02-19 NOTE — Telephone Encounter (Signed)
-----   Message from Alfredia Ferguson, PA-C sent at 02/19/2021  1:18 PM EDT ----- Please call patient and wife and let them know that the blood counts from yesterday look better, hemoglobin is 10.2,, kidney function has improved somewhat as well creatinine 1.63 No significant iron deficiency currently I think he needs to get a CBC checked again in 7 to 10 days, we want to be sure this stays stable.

## 2021-02-19 NOTE — Telephone Encounter (Signed)
Spoke with the patient and advised him of his results. He is pleased and agrees to this plan. He asks if you will add a testosterone level for PCP to these labs.

## 2021-02-20 ENCOUNTER — Other Ambulatory Visit: Payer: Self-pay

## 2021-02-20 DIAGNOSIS — R7989 Other specified abnormal findings of blood chemistry: Secondary | ICD-10-CM

## 2021-02-20 NOTE — Telephone Encounter (Signed)
Patient tells me he has had to take testosterone injections but not recently. He understands this will be sent to his PCP.

## 2021-02-24 NOTE — Progress Notes (Signed)
I agree with the above note, plan 

## 2021-02-25 ENCOUNTER — Other Ambulatory Visit (INDEPENDENT_AMBULATORY_CARE_PROVIDER_SITE_OTHER): Payer: Medicare Other

## 2021-02-25 DIAGNOSIS — Z8719 Personal history of other diseases of the digestive system: Secondary | ICD-10-CM

## 2021-02-25 DIAGNOSIS — K5900 Constipation, unspecified: Secondary | ICD-10-CM | POA: Diagnosis not present

## 2021-02-25 DIAGNOSIS — D62 Acute posthemorrhagic anemia: Secondary | ICD-10-CM

## 2021-02-25 DIAGNOSIS — K209 Esophagitis, unspecified without bleeding: Secondary | ICD-10-CM | POA: Diagnosis not present

## 2021-02-25 LAB — HEMOCCULT SLIDES (X 3 CARDS)
Fecal Occult Blood: NEGATIVE
OCCULT 1: NEGATIVE
OCCULT 2: NEGATIVE
OCCULT 3: NEGATIVE
OCCULT 4: NEGATIVE
OCCULT 5: NEGATIVE

## 2021-02-26 ENCOUNTER — Telehealth: Payer: Self-pay | Admitting: Cardiovascular Disease

## 2021-02-26 ENCOUNTER — Other Ambulatory Visit (INDEPENDENT_AMBULATORY_CARE_PROVIDER_SITE_OTHER): Payer: Medicare Other

## 2021-02-26 DIAGNOSIS — D62 Acute posthemorrhagic anemia: Secondary | ICD-10-CM | POA: Diagnosis not present

## 2021-02-26 DIAGNOSIS — R7989 Other specified abnormal findings of blood chemistry: Secondary | ICD-10-CM

## 2021-02-26 LAB — CBC WITH DIFFERENTIAL/PLATELET
Basophils Absolute: 0.1 10*3/uL (ref 0.0–0.1)
Basophils Relative: 1.5 % (ref 0.0–3.0)
Eosinophils Absolute: 0.1 10*3/uL (ref 0.0–0.7)
Eosinophils Relative: 2 % (ref 0.0–5.0)
HCT: 30.5 % — ABNORMAL LOW (ref 39.0–52.0)
Hemoglobin: 10.3 g/dL — ABNORMAL LOW (ref 13.0–17.0)
Lymphocytes Relative: 25.7 % (ref 12.0–46.0)
Lymphs Abs: 1.5 10*3/uL (ref 0.7–4.0)
MCHC: 33.9 g/dL (ref 30.0–36.0)
MCV: 89.3 fl (ref 78.0–100.0)
Monocytes Absolute: 0.3 10*3/uL (ref 0.1–1.0)
Monocytes Relative: 5.5 % (ref 3.0–12.0)
Neutro Abs: 3.8 10*3/uL (ref 1.4–7.7)
Neutrophils Relative %: 65.3 % (ref 43.0–77.0)
Platelets: 220 10*3/uL (ref 150.0–400.0)
RBC: 3.42 Mil/uL — ABNORMAL LOW (ref 4.22–5.81)
RDW: 18 % — ABNORMAL HIGH (ref 11.5–15.5)
WBC: 5.9 10*3/uL (ref 4.0–10.5)

## 2021-02-26 NOTE — Telephone Encounter (Signed)
Reached back out to Mr. Delillo, advised to proceed with labs that are already order by another provider, may go to [place] to have drawn. Dr. Rockey Situ can review results at appt this Friday in office.   Mr. Crapps verbalized understanding.

## 2021-02-26 NOTE — Telephone Encounter (Signed)
Please call and advise if patient can get labwork drawn that the hospitalist ordered at his next appointment.

## 2021-02-27 ENCOUNTER — Other Ambulatory Visit: Payer: Self-pay | Admitting: *Deleted

## 2021-02-27 DIAGNOSIS — D62 Acute posthemorrhagic anemia: Secondary | ICD-10-CM

## 2021-02-27 LAB — TESTOSTERONE TOTAL,FREE,BIO, MALES
Albumin: 3.6 g/dL (ref 3.6–5.1)
Sex Hormone Binding: 23 nmol/L (ref 22–77)
Testosterone: 54 ng/dL — ABNORMAL LOW (ref 250–827)

## 2021-03-01 ENCOUNTER — Encounter: Payer: Self-pay | Admitting: Cardiovascular Disease

## 2021-03-01 ENCOUNTER — Telehealth: Payer: Self-pay | Admitting: Pharmacist

## 2021-03-01 ENCOUNTER — Other Ambulatory Visit: Payer: Self-pay

## 2021-03-01 ENCOUNTER — Ambulatory Visit (INDEPENDENT_AMBULATORY_CARE_PROVIDER_SITE_OTHER): Payer: Medicare Other | Admitting: Cardiovascular Disease

## 2021-03-01 VITALS — BP 90/60 | HR 70 | Ht 68.0 in | Wt 209.4 lb

## 2021-03-01 DIAGNOSIS — C8335 Diffuse large B-cell lymphoma, lymph nodes of inguinal region and lower limb: Secondary | ICD-10-CM

## 2021-03-01 DIAGNOSIS — I10 Essential (primary) hypertension: Secondary | ICD-10-CM

## 2021-03-01 DIAGNOSIS — I272 Pulmonary hypertension, unspecified: Secondary | ICD-10-CM | POA: Diagnosis not present

## 2021-03-01 DIAGNOSIS — I739 Peripheral vascular disease, unspecified: Secondary | ICD-10-CM

## 2021-03-01 DIAGNOSIS — E119 Type 2 diabetes mellitus without complications: Secondary | ICD-10-CM | POA: Diagnosis not present

## 2021-03-01 DIAGNOSIS — I25118 Atherosclerotic heart disease of native coronary artery with other forms of angina pectoris: Secondary | ICD-10-CM | POA: Diagnosis not present

## 2021-03-01 DIAGNOSIS — E782 Mixed hyperlipidemia: Secondary | ICD-10-CM

## 2021-03-01 MED ORDER — APIXABAN 5 MG PO TABS: 5.0000 mg | ORAL_TABLET | Freq: Two times a day (BID) | ORAL | 3 refills | Status: DC

## 2021-03-01 NOTE — Telephone Encounter (Signed)
Called pt, he reports he was quoted $390 for a month supply of Eliquis. He has [employer] insurance in [place]. Tried to pull up his formulary, Eliquis is a Tier 3 medication which should be $42/month at a preferred pharmacy. Sounds as though he has a deductible to meet. He will call his insurance to see if this is the case, but is ok with copay once deductible is met. Also advised him to look for different Part D plans next year that do not have deductibles on Tier 3 meds. He was appreciative for the call. 

## 2021-03-01 NOTE — Telephone Encounter (Signed)
-----   Message from [name], RN sent at 03/01/2021  9:12 AM EDT ----- Regarding: [name],  Dr. Rockey Situ asked me to reach out to you regarding [name], he was wondering if someone from PharmD could reach out to pt and speak to him (consult) regarding newly started Eliquis. Pt was placed on Eliquis at d/c from [facility] no info was given to him at that time.   We sent in refills today and gave 4 boxes of free samples, plus 30-day free sample card.   Thanks [employer]

## 2021-03-01 NOTE — Progress Notes (Signed)
Date:  03/01/2021   ID:  [name], DOB [birth year], MRN [id number]  Patient Location:  [address] [place] [address]   Provider location:   [facility], [place] office  PCP:  Bing Neighbors, NP  Cardiologist:  Arvid Right [facility]   Chief Complaint  Patient presents with   Hospitalization Follow-up    s/p left knee replacement with new A-Fib. Patient c/o weakness. Medications reviewed by the patient verbally.     History of Present Illness:    [name] is a 75 y.o. 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   Developed ileus and partial small bowel obstruction Central venous catheter was placed and started on vasopressors for shock Also developed acute hypoxic respiratory failure due to aspiration pneumonitis.  Noted to have atrial fibrillation requiring amiodarone and heparin drip. 5/20 he had coffee-ground emesis through NG tube.  Required 2 units packed red blood cell transfusion and heparin drip was discontinued  05/21.  On 5/23 and 5/25 he required PRBC transfusion adding a total of 4 packed red blood cells in total.   Underwent EGD 5/26, finding esophagitis due to Candida. Started on antifungal therapy with good toleration.    On 5/27 his NG tube was removed.  Since discharge, try to get his strength back Little bit of orthostasis Pressure at home 87/40s  EKG personally reviewed by myself on todays visit NSR with RBBB no significant ST-T wave changes     Past Medical History:  Diagnosis Date   Anemia    Cancer ([place]) 2011   lymphoma L leg   Diabetes mellitus without complication (HCC)    GERD (gastroesophageal reflux disease)    Hx of lymphoma    Hyperlipidemia    Hypertension    Hypotestosteronemia    Lymphoma ([place])    Pneumonia    SBO (small bowel obstruction) ([place])    Sleep apnea 2013   wears CPAP   Past Surgical History:  Procedure Laterality Date   CARDIAC CATHETERIZATION     Dr. Sabra Heck   ESOPHAGOGASTRODUODENOSCOPY (EGD) WITH PROPOFOL N/A 01/31/2021   Procedure: ESOPHAGOGASTRODUODENOSCOPY (EGD) WITH PROPOFOL;  Surgeon: Milus Banister, MD;  Location: [facility];  Service: Endoscopy;  Laterality: N/A;   RIGHT/LEFT HEART CATH AND CORONARY ANGIOGRAPHY N/A 01/02/2017   Procedure: Right/Left Heart Cath and Coronary Angiography;  Surgeon: Jolaine Artist, MD;  Location: [facility];  Service: Cardiovascular;  Laterality: N/A;   TOTAL KNEE ARTHROPLASTY Left 01/16/2021   Procedure: TOTAL KNEE ARTHROPLASTY;  Surgeon: Susa Day, MD;  Location: [facility] OR[facility];  Service: Orthopedics;  Laterality: Left;  2.5 hrs General vs Spinal     Allergies:   Penicillins and Nsaids   Social History   Tobacco Use   Smoking status: Never   Smokeless tobacco: Never  Vaping Use   Vaping Use: Never used  Substance Use Topics   Alcohol use: No   Drug use: No     Current Outpatient Medications on File Prior to Visit  Medication Sig Dispense Refill   acetaminophen (TYLENOL) 325 MG  tablet Take 2 tablets (650 mg total) by mouth every 6 (six) hours as needed for mild pain.     allopurinol (ZYLOPRIM) 100 MG tablet Take 100 mg by mouth at bedtime.     amiodarone (PACERONE) 200 MG tablet Take 1 tablet (200 mg total) by mouth daily. 30 tablet 0   apixaban (ELIQUIS) 5 MG TABS tablet Take 1 tablet (5 mg total) by mouth 2 (two) times daily. 60 tablet 0   Ascorbic Acid (VITAMIN C) 1000 MG tablet Take 1,000 mg by mouth in the morning.     atorvastatin (LIPITOR) 20 MG tablet Take 20 mg by mouth every evening.     B Complex-Biotin-FA (VITAMIN B50 COMPLEX PO) Take 1 capsule by mouth in the morning.     Calcium Carb-Cholecalciferol (CALCIUM 600+D) 600-800 MG-UNIT TABS Take 1 tablet by mouth in the morning.     Cholecalciferol (VITAMIN D3) 10 MCG (400 UNIT) tablet Take 400 Units by mouth in the morning.     diclofenac Sodium (VOLTAREN) 1 % GEL Apply 1 application topically 4 (four) times daily as needed (knee pain).     docusate sodium (COLACE) 100 MG capsule Take 1 capsule (100 mg total) by mouth 2 (two) times daily as needed for mild constipation. 40 capsule 0   furosemide (LASIX) 40 MG tablet Take 1 tablet (40 mg total) by mouth daily. 30 tablet 0   ipratropium-albuterol (DUONEB) 0.5-2.5 (3) MG/3ML SOLN Inhale 3 mLs into the lungs every 6 (six) hours as needed (illness (colds)/respiratory).     Menthol, Topical Analgesic, (BIOFREEZE EX) Apply 1 application topically 4 (four) times daily as needed (knee pain.).     metFORMIN (GLUCOPHAGE-XR) 500 MG 24 hr tablet Take 500 mg by mouth at bedtime.     metoprolol succinate (TOPROL-XL) 25 MG 24 hr tablet Take 1 tablet (25 mg total) by mouth 2 (two) times daily at 10 AM and 5 PM. 60 tablet 0   Multiple Vitamin (MULTIVITAMIN WITH MINERALS) TABS tablet Take 1 tablet by mouth in the morning. Centrum Silver     Nutritional Supplements (,FEEDING SUPPLEMENT, PROSOURCE PLUS) liquid Take 30 mLs by mouth 2 (two) times daily between meals. 1800 mL 0    oxybutynin (DITROPAN) 5 MG tablet Take 5 mg by mouth in the morning.     OXYGEN Inhale 2.5 L into the lungs at bedtime.     pantoprazole (PROTONIX) 40 MG tablet Take 1 tablet (40 mg total) by mouth in the morning. 30 tablet 6   polyethylene glycol (MIRALAX / GLYCOLAX) 17 g packet Mix 17 g with 8 ounces of liquid and take by mouth daily as needed for mild constipation. 14 each 0   spironolactone (ALDACTONE) 25 MG tablet Take 1 tablet (25 mg total) by mouth at bedtime. 90 tablet 3   No current facility-administered medications on file prior to visit.     Family Hx: The patient's family history includes Heart attack in his father; Heart disease in his father. There is no history of Colon cancer, Rectal  cancer, Stomach cancer, or Esophageal cancer.  ROS:   Please see the history of present illness.    Review of Systems  Constitutional:  Positive for malaise/fatigue.  HENT: Negative.    Respiratory: Negative.    Cardiovascular: Negative.   Gastrointestinal: Negative.   Musculoskeletal: Negative.   Neurological: Negative.   Psychiatric/Behavioral: Negative.    All other systems reviewed and are negative.   Labs/Other Tests and Data Reviewed:    Recent Labs: 01/29/2021: B Natriuretic Peptide 69.1 02/04/2021: Magnesium 2.1 02/18/2021: ALT 11; BUN 28; Creatinine, Ser 1.63; Potassium 4.6; Sodium 138 02/26/2021: Hemoglobin 10.3; Platelets 220.0   Recent Lipid Panel Lab Results  Component Value Date/Time   CHOL 121 04/29/2018 10:12 AM   CHOL 137 09/22/2016 09:13 AM   TRIG 77 02/04/2021 03:20 AM   HDL 28 (L) 04/29/2018 10:12 AM   HDL 42 09/22/2016 09:13 AM   CHOLHDL 4.3 04/29/2018 10:12 AM   LDLCALC 62 04/29/2018 10:12 AM   LDLCALC 68 09/22/2016 09:13 AM    Wt Readings from Last 3 Encounters:  03/01/21 209 lb 6 oz (95 kg)  02/18/21 207 lb 2 oz (94 kg)  02/06/21 218 lb 14.4 oz (99.3 kg)     Exam:    Vital Signs: Vital signs may also be detailed in the HPI BP 90/60 (BP Location:  Left Arm, Patient Position: Sitting, Cuff Size: Normal)   Pulse 70   Ht 5\' 8"  (1.727 m)   Wt 209 lb 6 oz (95 kg)   SpO2 98%   BMI 31.84 kg/m   Constitutional:  oriented to person, place, and time. No distress.  HENT:  Head: Grossly normal Eyes:  no discharge. No scleral icterus.  Neck: No JVD, no carotid bruits  Cardiovascular: Regular rate and rhythm, no murmurs appreciated Pulmonary/Chest: Clear to auscultation bilaterally, no wheezes or rails Abdominal: Soft.  no distension.  no tenderness.  Musculoskeletal: Normal range of motion Neurological:  normal muscle tone. Coordination normal. No atrophy Skin: Skin warm and dry Psychiatric: normal affect, pleasant   ASSESSMENT & PLAN:    Problem List Items Addressed This Visit       Cardiology Problems   Hyperlipidemia   Essential hypertension   PAD (peripheral artery disease) (HCC)   CAD (coronary artery disease)   Pulmonary HTN ([place]) - Primary     Other   Type 2 diabetes mellitus without complication, without long-term current use of insulin (HCC)   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 ([place])   Other Visit Diagnoses     Morbid obesity ([place])          Mixed hyperlipidemia - Cholesterol is at goal on the current lipid regimen. No changes to the medications were made.  Hypotension Blood pressure continues to run low on today's office visit and at home Having orthostasis symptoms Recommended that he stop the spironolactone Continue metoprolol  Paroxysmal atrial fibrillation Noted in the hospital in the setting of sepsis, Long discussion concerning price of Eliquis, Will request help from pharmacy, he should call his insurance to make sure he has coverage for either Eliquis or Xarelto Samples and coupon provided  Coronary artery disease involving native coronary artery of native heart without angina pectoris - Plan: EKG 12-Lead Currently with no symptoms of angina. No further workup at this  time. Continue current medication regimen.   PAD (peripheral artery disease) (HCC) - Plan: EKG 12-Lead Aortic atherosclerosis, coronary disease seen on previous PET scans No further work-up, cholesterol  at goal   Type 2 diabetes mellitus without complication, without long-term current use of insulin (HCC) -  Recent weight loss from hospitalization and sepsis   Pulmonary hypertension Prior echocardiogram with moderately elevated pressures Previously seen by advanced heart failure clinic in [place], normal RH pressures .  No further intervention was recommended Most recent echocardiogram again documenting moderately elevated right heart pressures He prefers to take Lasix as needed, denies ankle swelling or shortness of breath on exertion   Total encounter time more than 35 minutes  Greater than 50% was spent in counseling and coordination of care with the patient    Signed, Ida Rogue, [place] Office [address], [place], [place] [address]

## 2021-03-01 NOTE — Patient Instructions (Addendum)
Medication Instructions:  No changes Eliquis refills sent in Samples given (4 boxes) LOT: [id number] EXP: 01/2023 Eliquis 30-day card and given and $10 co-pay (try to register, if can)  [facility] [phone]  If you need a refill on your cardiac medications before your next appointment, please call your pharmacy.   Lab work: No new labs needed  Testing/Procedures: No new testing needed  Follow-Up: At [facility], you and your health needs are our priority.  As part of our continuing mission to provide you with exceptional heart care, we have created designated Provider Care Teams.  These Care Teams include your primary Cardiologist (physician) and Advanced Practice Providers (APPs -  Physician Assistants and Nurse Practitioners) who all work together to provide you with the care you need, when you need it.  You will need a follow up appointment in 12 months  Providers on your designated Care Team:   Murray Hodgkins, NP Christell Faith, PA-C Marrianne Mood, PA-C Cadence Burrton, [place]  COVID-19 Vaccine Information can be found at: [email or web address].uk For questions related to vaccine distribution or appointments, please email [email or web address] or call [phone].

## 2021-03-15 ENCOUNTER — Ambulatory Visit: Payer: Medicare Other | Admitting: Cardiovascular Disease

## 2021-03-15 ENCOUNTER — Telehealth: Payer: Self-pay | Admitting: Cardiovascular Disease

## 2021-03-15 MED ORDER — AMIODARONE HCL 200 MG PO TABS: 200.0000 mg | ORAL_TABLET | Freq: Every day | ORAL | 2 refills | Status: AC

## 2021-03-15 NOTE — Telephone Encounter (Signed)
Requested Prescriptions   Signed Prescriptions Disp Refills   amiodarone (PACERONE) 200 MG tablet 90 tablet 2    Sig: Take 1 tablet (200 mg total) by mouth daily.    Authorizing Provider: Minna Merritts    Ordering User: Raelene Bott, Zahki Hoogendoorn L

## 2021-03-15 NOTE — Telephone Encounter (Signed)
Patient calling  Would like to clarify if he still should take amiodarone 200 MG  If so, he will need a refill  *STAT* If patient is at the pharmacy, call can be transferred to refill team.   1. Which medications need to be refilled? (please list name of each medication and dose if known) amiodarone 200 MG 1 tablet daily  2. Which pharmacy/location (including street and city if local pharmacy) is medication to be sent to? [facility] in [place]  3. Do they need a 30 day or 90 day supply? 90 day

## 2021-03-20 ENCOUNTER — Ambulatory Visit: Payer: Medicare Other | Admitting: Cardiovascular Disease

## 2021-03-27 ENCOUNTER — Other Ambulatory Visit: Payer: Self-pay

## 2021-03-27 MED ORDER — APIXABAN 5 MG PO TABS: 5.0000 mg | ORAL_TABLET | Freq: Two times a day (BID) | ORAL | 3 refills | Status: AC

## 2021-03-28 ENCOUNTER — Telehealth: Payer: Self-pay | Admitting: Cardiovascular Disease

## 2021-03-28 NOTE — Telephone Encounter (Signed)
Pt c/o BP issue: STAT if pt c/o blurred vision, one-sided weakness or slurred speech  1. What are your last 5 BP readings? 140-145/80-85 every morning for a week and a half  2. Are you having any other symptoms (ex. Dizziness, headache, blurred vision, passed out)? no  3. What is your BP issue? Higher than usual after medication changes a month ago

## 2021-03-28 NOTE — Telephone Encounter (Signed)
Spoke with patient and he reports that since we stopped his spironolactone his systolic blood pressure readings have been 137-145's. While taking that medication his numbers would run Systolic 32-549'I and Diastolic 26-41'R. Most of his elevated readings are prior to medications. Reviewed that blood pressures will be higher before medications and we really would like to know how they are running 2 hours after meds. He did want to know if he should go back on spironolactone to keep blood pressures under better control. Advised to continue monitoring and that I would send this information to provider for his review and any recommendations.

## 2021-04-01 NOTE — Telephone Encounter (Signed)
Spoke with patient and reviewed provider recommendations to monitor blood pressures 2 hours after his medications. Requested he do this until Friday then send me those readings through My Chart. Advised that if they continue to be elevated then we can determine if adding that medication is needed. He verbalized understanding, was agreeable with plan, and had no further questions at this time.      Minna Merritts, MD  [name], RN Yesterday 03/31/21 (11:30 AM)    Would prefer some BP numbers 2 hrs after medications  Ok to start spironolactone 12.5 daily (1/2 dose)  if SBP>130  TG

## 2021-04-04 MED ORDER — SPIRONOLACTONE 25 MG PO TABS: 12.5000 mg | ORAL_TABLET | Freq: Every day | ORAL | 3 refills | Status: AC

## 2021-04-04 NOTE — Telephone Encounter (Signed)
Patient calling to check on status.

## 2021-04-04 NOTE — Telephone Encounter (Signed)
Spoke with patient and reviewed recommendations from Dr. Rockey Situ to start Spironolactone 12.5 mg once daily. Instructed him to continue monitoring his blood pressures and to keep a log so we can see if this helps. He verbalized understanding with no further questions at this time.

## 2021-04-05 ENCOUNTER — Telehealth: Payer: Self-pay | Admitting: Physician Assistant

## 2021-04-05 NOTE — Telephone Encounter (Signed)
Inbound call from patient. Calling in about lab work. States he will get it done on Tuesday

## 2021-04-09 ENCOUNTER — Other Ambulatory Visit (INDEPENDENT_AMBULATORY_CARE_PROVIDER_SITE_OTHER): Payer: Medicare Other

## 2021-04-09 DIAGNOSIS — D62 Acute posthemorrhagic anemia: Secondary | ICD-10-CM

## 2021-04-09 LAB — CBC WITH DIFFERENTIAL/PLATELET
Basophils Absolute: 0.1 10*3/uL (ref 0.0–0.1)
Basophils Relative: 1.1 % (ref 0.0–3.0)
Eosinophils Absolute: 0.1 10*3/uL (ref 0.0–0.7)
Eosinophils Relative: 1.6 % (ref 0.0–5.0)
HCT: 33.2 % — ABNORMAL LOW (ref 39.0–52.0)
Hemoglobin: 11 g/dL — ABNORMAL LOW (ref 13.0–17.0)
Lymphocytes Relative: 31.6 % (ref 12.0–46.0)
Lymphs Abs: 1.5 10*3/uL (ref 0.7–4.0)
MCHC: 33.2 g/dL (ref 30.0–36.0)
MCV: 90.8 fl (ref 78.0–100.0)
Monocytes Absolute: 0.4 10*3/uL (ref 0.1–1.0)
Monocytes Relative: 9 % (ref 3.0–12.0)
Neutro Abs: 2.7 10*3/uL (ref 1.4–7.7)
Neutrophils Relative %: 56.7 % (ref 43.0–77.0)
Platelets: 188 10*3/uL (ref 150.0–400.0)
RBC: 3.66 Mil/uL — ABNORMAL LOW (ref 4.22–5.81)
RDW: 18.2 % — ABNORMAL HIGH (ref 11.5–15.5)
WBC: 4.7 10*3/uL (ref 4.0–10.5)

## 2021-04-16 ENCOUNTER — Telehealth: Payer: Self-pay | Admitting: Cardiovascular Disease

## 2021-04-16 NOTE — Telephone Encounter (Signed)
Pt has sent 2 MyChart message with concern Responded to pt via MyChart to f/u on conversation with Dr. Rockey Situ advice

## 2021-04-16 NOTE — Telephone Encounter (Signed)
Minna Merritts, MD to [name], RN      11:19 AM Would obtain BP and HR numbers when not feeling well,  By his other numbers, would suggest it may not be cardiac and may want to reach out to  PMD if sx persist after hydrating  Prior message, we we going to increase spironolactone to 25 daily for high pressure,  But perhaps he can hold spirolactone on days when in the hot sun and chance of dehydration  TG    [place], [place] to [place], Amy S, [place]       9:01 AM This is [name] 04-13-1946 Still having issues with my BP running high 140s to high 150s I had a very dizzy spell last night sweating nauseated left me very weak  My son a nurse said it sound like lost to much fluids. Need to talk to nurse please  April 12, 2021  Greggory Keen, LPN to Minna Merritts, MD   \   9:31 AM I think the patient means to send these to your team.    [name] to [place], Amy S, PA-C      9:16 AM BP Reading. 8/3-8/5 8/3.  8:03pm.  128/69.Marland Kitchen,.75   8/4.  7:20am.   148/81..88         7:25am.    139/86.84         7:40am.    144/86.,,81         2:43pm.    124/75..82         9:41pm.     139/82.74   8/5.  7:28am.     145/86.81         7:30am.

## 2021-04-16 NOTE — Telephone Encounter (Signed)
Patient calling to discuss advice below.    Patient also adds recent hospital visit meds for bp and fluid dc

## 2021-05-21 ENCOUNTER — Other Ambulatory Visit (HOSPITAL_COMMUNITY): Payer: Self-pay

## 2021-06-14 ENCOUNTER — Telehealth: Payer: Self-pay | Admitting: Cardiovascular Disease

## 2021-06-14 NOTE — Telephone Encounter (Signed)
Pt c/o medication issue:  1. Name of Medication: testosterone INJ  2. How are you currently taking this medication (dosage and times per day)? Holding since recent admission   3. Are you having a reaction (difficulty breathing--STAT)? Low energy   4. What is your medication issue? Patient wants to know if safe to resume taking testosterone .  C/o no energy .

## 2021-06-17 NOTE — Telephone Encounter (Signed)
Was able to reach back out to [name] regarding earlier phone conversation for pt's Testerone INJ. Per pharmacy   [name], [facility]   Pharmacist  Pharmacy  Telephone Encounter  Addendum  Creation Time:  06/17/2021  4:52 PM           Based upon the documentation in the chart it is ok for [name] to resume his testosterone injections. Testosterone can cause some GI side effects, but none of the GI side effects that occurred due to his admission seem to be related to the testosterone. Of note, given his heart failure would recommend monitoring for any symptoms of increased fluid retention as the testosterone could cause this. If this occurs would stop the medication and reach out to his cardiologist. Testosterone also has a Black Box warning for increased cardiovascular events and the patient has known history of CAD, however patient has been taking testosterone for over a decade. Additionally, would recommend following up with his PCP for lab monitoring.     [name] is very thankful for the return call, did review with [name] symptoms to watch out for on a cardiac standpoint once restarting Testosterone INJ. She verbalized understanding.  Also understands that PCP will need to monitor labs.   Otherwise all questions or concerns were address and no additional concerns at this time. Agreeable to plan, will call back for anything further.

## 2021-06-17 NOTE — Telephone Encounter (Addendum)
Based upon the documentation in the chart it is ok for [name] to resume his testosterone injections. Testosterone can cause some GI side effects, but none of the GI side effects that occurred due to his admission seem to be related to the testosterone. Of note, given his heart failure would recommend monitoring for any symptoms of increased fluid retention as the testosterone could cause this. If this occurs would stop the medication and reach out to his cardiologist. Testosterone also has a Black Box warning for increased cardiovascular events and the patient has known history of CAD, however patient has been taking testosterone for over a decade. Additionally, would recommend following up with his PCP for lab monitoring.

## 2021-06-17 NOTE — Telephone Encounter (Signed)
Was able to reach out to Mrs. Huettner (DPR approved) regarding pt's testosterone INJ as pt is feeling very week, reports his testosterone level is "100" per wife (she will have results fax in).   Wife inquiring if Mr. Laymon can restart his testosterone as he "feels horrible", but was told to stop this medication d/t sequence of events when pt went in for knee surgery back in March.   Brief/Interim Summary: Mr. Neumeister was admitted to the hospital with working diagnosis of left knee osteoarthritis, admitted for total knee replacement.  Hospitalization complicated by small bowel obstruction, postoperative ileus, upper GI bleed, esophageal candidiasis, acute kidney injury, heart failure and atrial fibrillation. -Arrien, Jimmy Picket, MD [facility])   Pt had been on: Testosterone Cypionate 200 MG/ML Kit Inject 200 mg into the muscle every 14 (fourteen) days.  It appears medication was d/c 02/18/2021 either by gastro or another "historical provider" per EMR  [DISCONTINUED] Testosterone Cypionate 200 MG/ML KIT Inject 200 mg into the muscle every 14 (fourteen) days.   Will route to pharmD to see if they have any advise if pt is okay to restart medication or if need to reach out to gastro for clearance or PCP.   Wife aware to expect a call back once determine if okay to restart or not on the testosterone.

## 2021-08-20 ENCOUNTER — Other Ambulatory Visit: Payer: Self-pay | Admitting: Cardiovascular Disease

## 2021-10-23 ENCOUNTER — Other Ambulatory Visit: Payer: Self-pay | Admitting: Physician Assistant

## 2022-04-08 IMAGING — CT CT CHEST-ABD-PELV W/O CM
3 of 9 series · 12 of 36 positions shown, 17 images · non-contrast
Comparison: Recent chest and abdominal radiographs. Abdominopelvic
CT 01/23/2021

CLINICAL DATA: Abdominal distension.  Anemia.

EXAM:
CT CHEST, ABDOMEN AND PELVIS WITHOUT CONTRAST
TECHNIQUE: Multidetector CT imaging of the chest, abdomen and pelvis was
performed following the standard protocol without IV contrast.

[Series 6: cap w/o · axial · non-contrast · 0.88mm/px · z∈[-882,-352]mm · 7 of 142 slices shown, 12 images (1 of 2)]
[im 18/142  mediastinal]
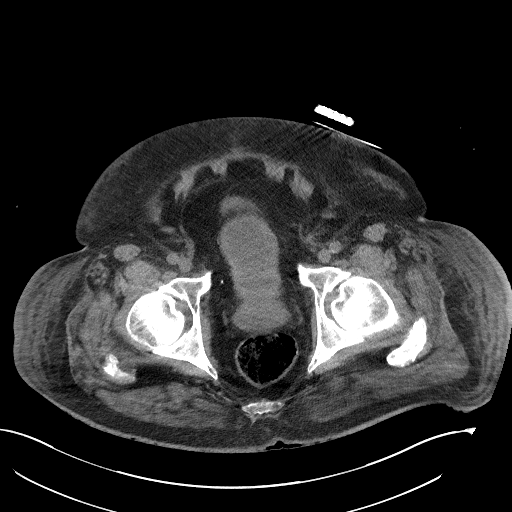
[im 18/142  bone]
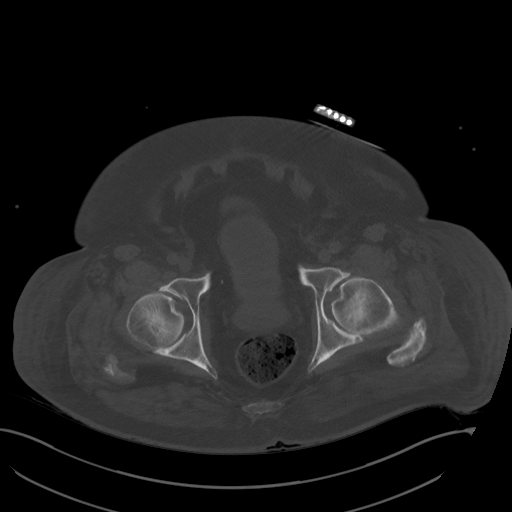
[im 36/142  mediastinal]
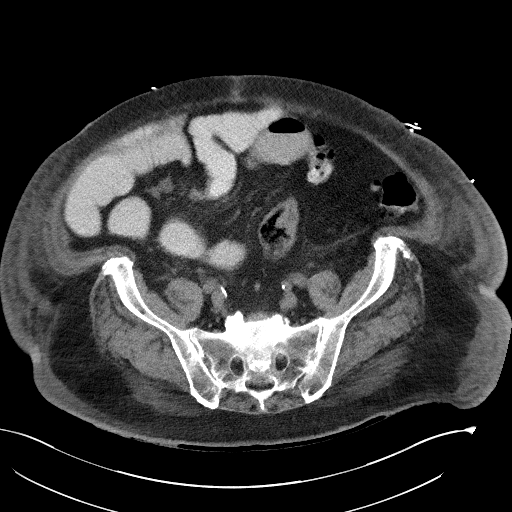
[im 53/142  mediastinal]
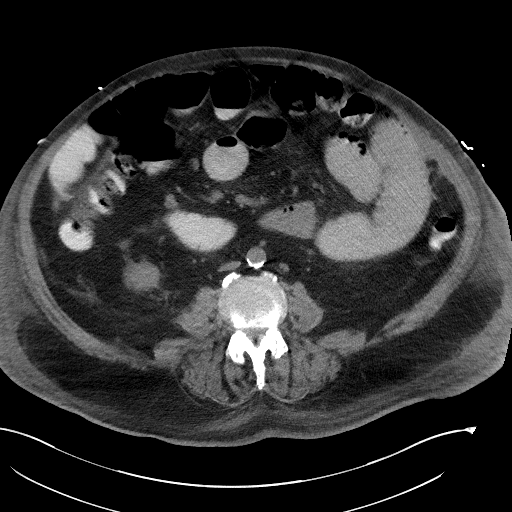
[im 71/142  mediastinal]
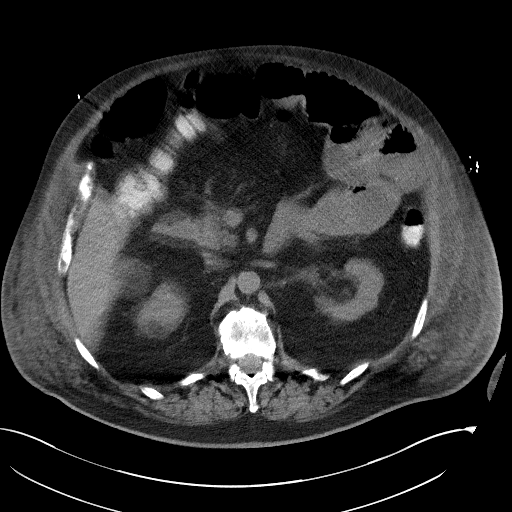
[im 71/142  lung]
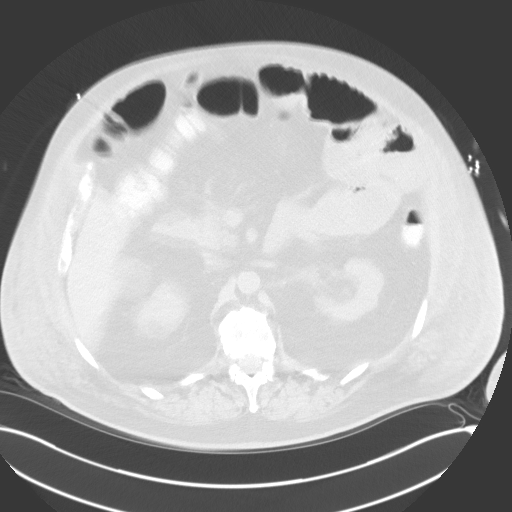
[im 89/142  mediastinal]
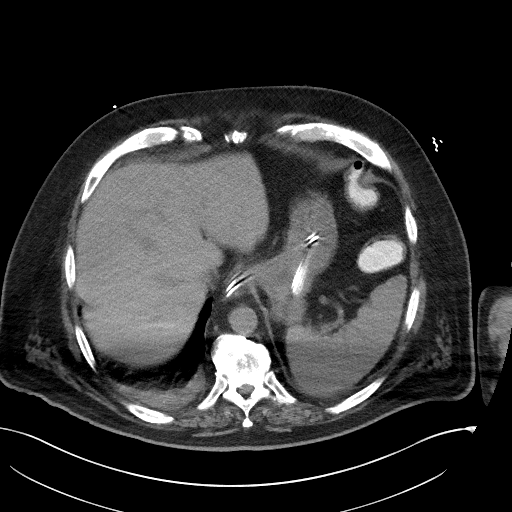
[im 89/142  lung]
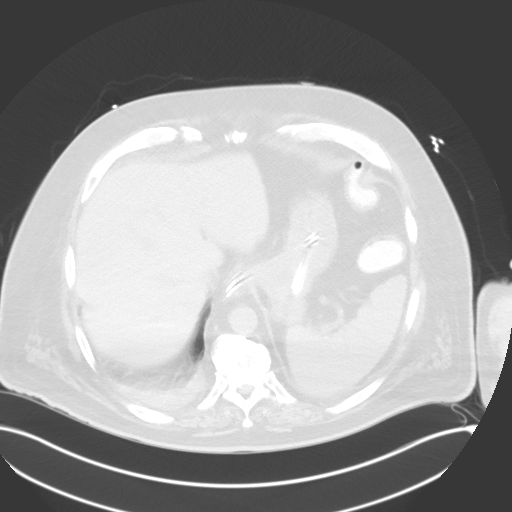
[im 106/142  mediastinal]
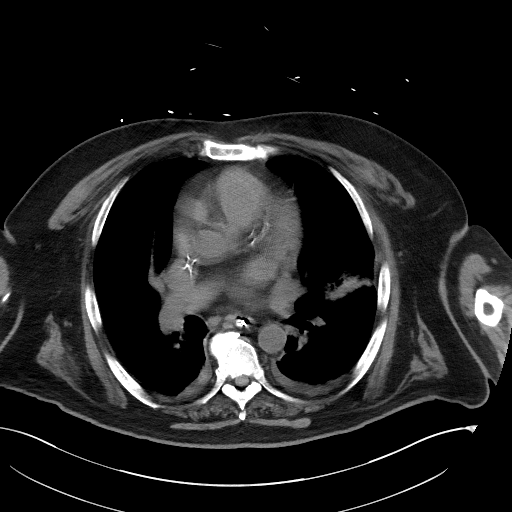
[im 106/142  lung]
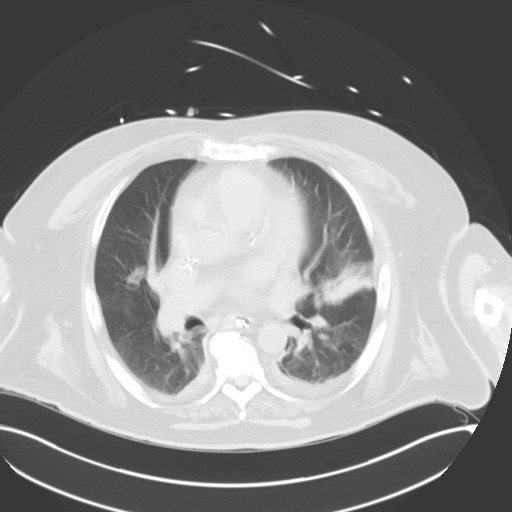
[im 124/142  mediastinal]
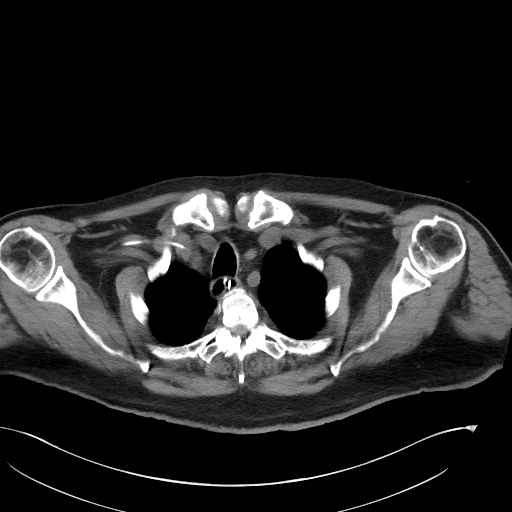
[im 124/142  lung]
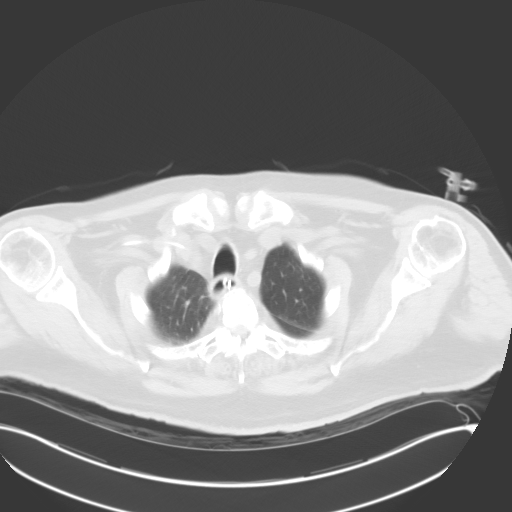

[Series 8: lung · axial · 0.83mm/px · z∈[-546,-474]mm · 3 of 160 slices shown]
[im 18/160  bone]
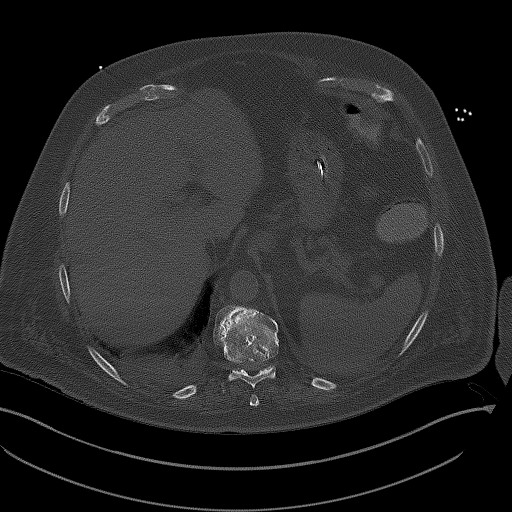
[im 36/160  bone]
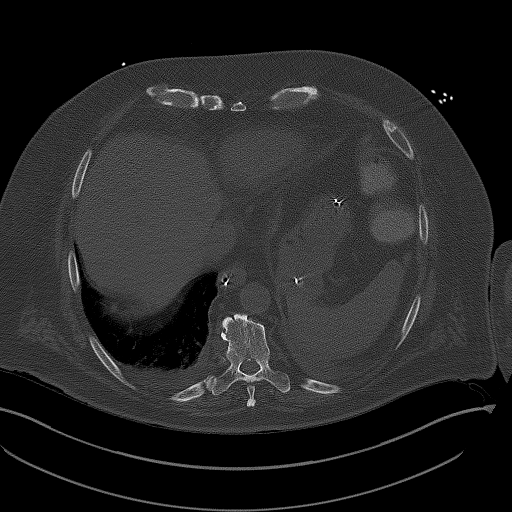
[im 54/160  bone]
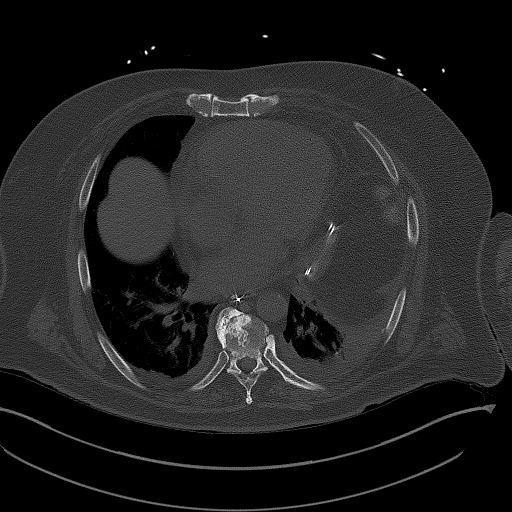

[Series 11: cap w/o · axial · non-contrast · 0.88mm/px · z∈[-625,-515]mm · 2 of 68 slices shown (2 of 2)]
[im 23/68  mediastinal]
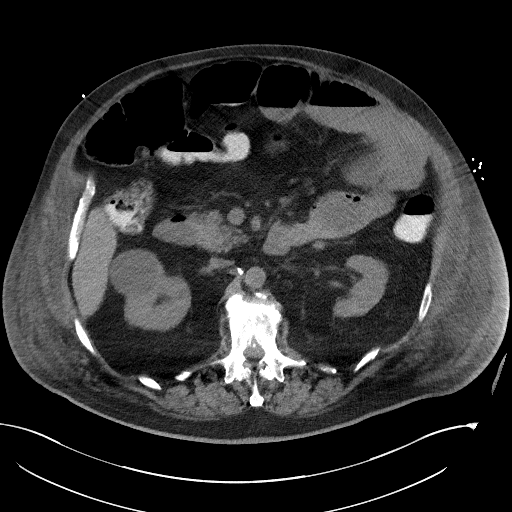
[im 45/68  mediastinal]
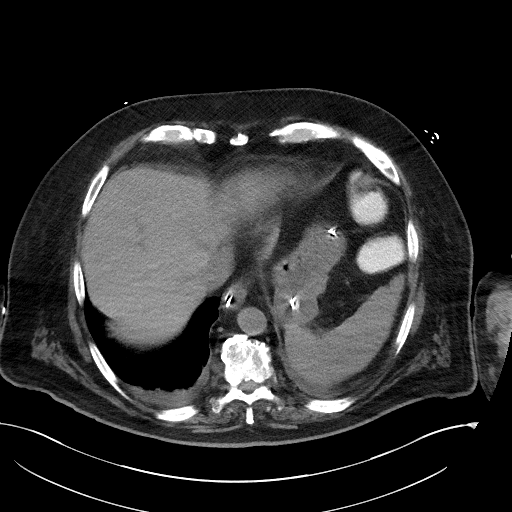

[12 of 36 positions shown; findings below may reference images not displayed]

FINDINGS: CT CHEST FINDINGS

Cardiovascular: Right upper extremity PICC tip at the atrial caval
junction. Aortic atherosclerosis and tortuosity without aneurysm.
There is dilatation of the main pulmonary artery at 4.3 cm. Mild
cardiomegaly. Coronary artery calcifications. No pericardial
effusion.

Mediastinum/Nodes: Small mediastinal lymph nodes are not enlarged by
size criteria. Limited assessment for hilar adenopathy on this
noncontrast exam. Nasogastric tube in the esophagus, which is mildly
patulous despite decompression. No suspicious thyroid nodule.

Lungs/Pleura: Small bilateral pleural effusions. Associated basilar
atelectasis. Additional compressive atelectasis in the left lower
lobe related to elevated hemidiaphragm. Motion artifact limits
assessment of pulmonary parenchyma. Linear platelike atelectasis in
the lingula. No septal thickening or findings of pulmonary edema. No
pneumothorax.

Musculoskeletal: Diffuse degenerative change throughout the thoracic
spine. No acute osseous abnormalities are seen. No chest wall soft
tissue abnormality.

CT ABDOMEN PELVIS FINDINGS

Hepatobiliary: Motion artifact through the liver and gallbladder
limiting assessment. No focal hepatic abnormality allowing for
motion and lack of IV contrast. Liver is prominent size spanning 20
cm cranial caudal. No calcified gallstones.

Pancreas: Motion artifact through the pancreas. No obvious ductal
dilatation or inflammation.

Spleen: Upper normal in size spanning 13.1 cm. No focal abnormality
allowing for motion and lack of IV contrast. Splenule inferiorly.

Adrenals/Urinary Tract: No adrenal nodule. Motion artifact through
the kidneys limits detailed assessment. There is no hydronephrosis.
Bilateral renal cysts. More detailed assessment is limited. The
urinary bladder is minimally distended. Mild bladder wall
thickening.

Stomach/Bowel: Enteric tube tip in the stomach. Stomach is
decompressed. Small bowel loops are diffusely dilated measuring up
to 3.9 cm, fluid and contrast filled. No obstruction, as enteric
contrast reaches the colon. No evidence of small bowel inflammation.
Appendix is not well seen on this current exam due to motion.
Administered enteric contrast reaches the sigmoid colon. Sigmoid
colonic diverticulosis without diverticulitis. Sigmoid colonic
redundancy. Small volume of colonic stool. No evidence of colonic
inflammation.

Vascular/Lymphatic: Moderate aortic atherosclerosis. No aortic
aneurysm. Motion limits assessment for adenopathy, no obvious
enlarged lymph nodes in the abdomen or pelvis.

Reproductive: Enlarged prostate gland spanning 5.4 cm.

Other: Trace free fluid in the pelvis. Perihepatic ascites in prior
exam has resolved. Generalized subcutaneous and flank soft tissue
edema. No free air or focal fluid collection.

Musculoskeletal: Degenerative change throughout the lumbar spine and
both hips. There are no acute or suspicious osseous abnormalities.
IMPRESSION: 1. Small bilateral pleural effusions with adjacent basilar
atelectasis. Additional compressive atelectasis in the left lower
lobe related to elevated hemidiaphragm.
2. Diffusely dilated fluid and contrast filled small bowel loops,
suggesting generalized ileus. No obstruction, as enteric contrast
reaches the colon.
3. Colonic diverticulosis without diverticulitis.
4. Enlarged prostate gland. Mild bladder wall thickening may be due
to chronic bladder outlet obstruction.
5. Enlarged main pulmonary artery suggesting pulmonary arterial
hypertension.
6. Aortic atherosclerosis and coronary artery calcifications.
7. Generalized body wall edema. Trace free fluid/ascites in the
pelvis.

Aortic Atherosclerosis ([id number]).

## 2022-04-16 ENCOUNTER — Other Ambulatory Visit (HOSPITAL_COMMUNITY): Payer: Self-pay

## 2022-06-23 ENCOUNTER — Ambulatory Visit: Payer: Medicare Other | Admitting: Cardiovascular Disease

## 2023-06-11 ENCOUNTER — Other Ambulatory Visit (HOSPITAL_COMMUNITY): Payer: Self-pay

## 2023-08-28 LAB — COLOGUARD: COLOGUARD: NEGATIVE
